# Patient Record
Sex: Female | Born: 1935 | Race: White | Hispanic: No | Smoking: Never smoker
Health system: Southern US, Community
[De-identification: ages and names within clinical notes are randomized; demographics above are authoritative.]

## PROBLEM LIST (undated history)

## (undated) DIAGNOSIS — I10 Essential (primary) hypertension: Secondary | ICD-10-CM

## (undated) DIAGNOSIS — K8020 Calculus of gallbladder without cholecystitis without obstruction: Secondary | ICD-10-CM

## (undated) DIAGNOSIS — M810 Age-related osteoporosis without current pathological fracture: Secondary | ICD-10-CM

## (undated) DIAGNOSIS — I4891 Unspecified atrial fibrillation: Secondary | ICD-10-CM

## (undated) DIAGNOSIS — N951 Menopausal and female climacteric states: Secondary | ICD-10-CM

## (undated) DIAGNOSIS — E8881 Metabolic syndrome: Secondary | ICD-10-CM

## (undated) DIAGNOSIS — M40209 Unspecified kyphosis, site unspecified: Secondary | ICD-10-CM

## (undated) DIAGNOSIS — E785 Hyperlipidemia, unspecified: Secondary | ICD-10-CM

## (undated) DIAGNOSIS — Z86010 Personal history of colonic polyps: Secondary | ICD-10-CM

## (undated) DIAGNOSIS — H269 Unspecified cataract: Secondary | ICD-10-CM

## (undated) DIAGNOSIS — I639 Cerebral infarction, unspecified: Secondary | ICD-10-CM

## (undated) DIAGNOSIS — Z860101 Personal history of adenomatous and serrated colon polyps: Secondary | ICD-10-CM

## (undated) DIAGNOSIS — S065X9A Traumatic subdural hemorrhage with loss of consciousness of unspecified duration, initial encounter: Secondary | ICD-10-CM

## (undated) DIAGNOSIS — S065XAA Traumatic subdural hemorrhage with loss of consciousness status unknown, initial encounter: Secondary | ICD-10-CM

## (undated) DIAGNOSIS — E669 Obesity, unspecified: Secondary | ICD-10-CM

## (undated) DIAGNOSIS — E559 Vitamin D deficiency, unspecified: Secondary | ICD-10-CM

## (undated) DIAGNOSIS — D369 Benign neoplasm, unspecified site: Secondary | ICD-10-CM

## (undated) HISTORY — DX: Menopausal and female climacteric states: N95.1

## (undated) HISTORY — DX: Traumatic subdural hemorrhage with loss of consciousness of unspecified duration, initial encounter: S06.5X9A

## (undated) HISTORY — DX: Benign neoplasm, unspecified site: D36.9

## (undated) HISTORY — DX: Age-related osteoporosis without current pathological fracture: M81.0

## (undated) HISTORY — DX: Metabolic syndrome: E88.810

## (undated) HISTORY — PX: BLADDER SUSPENSION: SHX72

## (undated) HISTORY — DX: Unspecified atrial fibrillation: I48.91

## (undated) HISTORY — DX: Personal history of adenomatous and serrated colon polyps: Z86.0101

## (undated) HISTORY — DX: Obesity, unspecified: E66.9

## (undated) HISTORY — DX: Personal history of colonic polyps: Z86.010

## (undated) HISTORY — DX: Unspecified kyphosis, site unspecified: M40.209

## (undated) HISTORY — DX: Hyperlipidemia, unspecified: E78.5

## (undated) HISTORY — DX: Calculus of gallbladder without cholecystitis without obstruction: K80.20

## (undated) HISTORY — DX: Vitamin D deficiency, unspecified: E55.9

## (undated) HISTORY — PX: COLONOSCOPY: SHX174

## (undated) HISTORY — PX: ABDOMINAL HYSTERECTOMY: SHX81

## (undated) HISTORY — PX: VAGINAL HYSTERECTOMY: SHX2639

## (undated) HISTORY — DX: Traumatic subdural hemorrhage with loss of consciousness status unknown, initial encounter: S06.5XAA

## (undated) HISTORY — DX: Unspecified cataract: H26.9

## (undated) HISTORY — DX: Metabolic syndrome: E88.81

## (undated) HISTORY — DX: Essential (primary) hypertension: I10

---

## 2000-05-28 ENCOUNTER — Encounter: Admission: RE | Admit: 2000-05-28 | Discharge: 2000-05-28 | Payer: Self-pay | Admitting: Obstetrics and Gynecology

## 2000-05-28 ENCOUNTER — Encounter: Payer: Self-pay | Admitting: Obstetrics and Gynecology

## 2001-05-26 ENCOUNTER — Other Ambulatory Visit: Admission: RE | Admit: 2001-05-26 | Discharge: 2001-05-26 | Payer: Self-pay | Admitting: Obstetrics and Gynecology

## 2003-03-02 ENCOUNTER — Inpatient Hospital Stay (HOSPITAL_COMMUNITY): Admission: EM | Admit: 2003-03-02 | Discharge: 2003-03-03 | Payer: Self-pay | Admitting: Emergency Medicine

## 2003-03-03 ENCOUNTER — Encounter (INDEPENDENT_AMBULATORY_CARE_PROVIDER_SITE_OTHER): Payer: Self-pay | Admitting: Specialist

## 2003-03-09 ENCOUNTER — Encounter: Admission: RE | Admit: 2003-03-09 | Discharge: 2003-03-09 | Payer: Self-pay | Admitting: Family Medicine

## 2005-06-15 ENCOUNTER — Ambulatory Visit (HOSPITAL_COMMUNITY): Admission: RE | Admit: 2005-06-15 | Discharge: 2005-06-15 | Payer: Self-pay | Admitting: Family Medicine

## 2006-03-26 ENCOUNTER — Other Ambulatory Visit: Admission: RE | Admit: 2006-03-26 | Discharge: 2006-03-26 | Payer: Self-pay | Admitting: Family Medicine

## 2006-12-03 LAB — HM COLONOSCOPY

## 2006-12-05 ENCOUNTER — Ambulatory Visit: Payer: Self-pay | Admitting: Internal Medicine

## 2006-12-19 ENCOUNTER — Ambulatory Visit: Payer: Self-pay | Admitting: Internal Medicine

## 2006-12-19 ENCOUNTER — Encounter (INDEPENDENT_AMBULATORY_CARE_PROVIDER_SITE_OTHER): Payer: Self-pay | Admitting: Specialist

## 2008-06-07 ENCOUNTER — Encounter: Admission: RE | Admit: 2008-06-07 | Discharge: 2008-06-07 | Payer: Self-pay | Admitting: Neurosurgery

## 2008-06-11 ENCOUNTER — Ambulatory Visit: Payer: Self-pay

## 2008-06-11 ENCOUNTER — Encounter: Payer: Self-pay | Admitting: Family Medicine

## 2008-06-18 ENCOUNTER — Ambulatory Visit: Payer: Self-pay | Admitting: Cardiology

## 2008-08-18 ENCOUNTER — Ambulatory Visit: Payer: Self-pay | Admitting: Cardiology

## 2008-09-22 ENCOUNTER — Encounter: Admission: RE | Admit: 2008-09-22 | Discharge: 2008-10-19 | Payer: Self-pay | Admitting: Specialist

## 2009-03-29 DIAGNOSIS — I10 Essential (primary) hypertension: Secondary | ICD-10-CM | POA: Insufficient documentation

## 2009-03-29 DIAGNOSIS — E785 Hyperlipidemia, unspecified: Secondary | ICD-10-CM

## 2009-03-30 ENCOUNTER — Ambulatory Visit: Payer: Self-pay | Admitting: Cardiology

## 2009-06-03 LAB — HM MAMMOGRAPHY

## 2010-06-15 LAB — HM PAP SMEAR

## 2010-06-30 ENCOUNTER — Encounter: Admission: RE | Admit: 2010-06-30 | Discharge: 2010-06-30 | Payer: Self-pay | Admitting: Family Medicine

## 2010-11-28 ENCOUNTER — Encounter: Payer: Self-pay | Admitting: Family Medicine

## 2010-11-28 DIAGNOSIS — Z860100 Personal history of colon polyps, unspecified: Secondary | ICD-10-CM | POA: Insufficient documentation

## 2010-11-28 DIAGNOSIS — I4891 Unspecified atrial fibrillation: Secondary | ICD-10-CM | POA: Insufficient documentation

## 2010-11-28 DIAGNOSIS — D369 Benign neoplasm, unspecified site: Secondary | ICD-10-CM

## 2010-11-28 DIAGNOSIS — E669 Obesity, unspecified: Secondary | ICD-10-CM

## 2010-11-28 DIAGNOSIS — K8020 Calculus of gallbladder without cholecystitis without obstruction: Secondary | ICD-10-CM | POA: Insufficient documentation

## 2010-11-28 DIAGNOSIS — M40209 Unspecified kyphosis, site unspecified: Secondary | ICD-10-CM | POA: Insufficient documentation

## 2010-11-28 DIAGNOSIS — N951 Menopausal and female climacteric states: Secondary | ICD-10-CM | POA: Insufficient documentation

## 2011-01-16 NOTE — Assessment & Plan Note (Signed)
[facility] HEALTHCARE                            CARDIOLOGY OFFICE NOTE   NAME:Kari [name], Kari [name]                       MRN:          [id number]  DATE:08/18/2008                            DOB:          [birth year]    PRIMARY:  Ernestina Penna, MD   REASON FOR PRESENTATION:  Evaluate the patient with atrial fibrillation.   HISTORY OF PRESENT ILLNESS:  The patient presents for followup of the  above.  Since I last saw her, she wore a Holter monitor, which  demonstrated AFib with a well-controlled rate.  She has otherwise done  quite well.  She is not noticing palpitations.  She has had no  presyncope or syncope.  She has had no chest discomfort, neck or arm  discomfort.  She has had no shortness of breath, PND, or orthopnea.  She  is tolerating the Coumadin.  She did slip on a step and broke a foot and  is on a walking cast.   PAST MEDICAL HISTORY:  Hyperlipidemia x5 years, hypertension x5 years,  hysterectomy.   ALLERGIES:  None.   MEDICATIONS:  1. Zetia 10 mg daily.  2. Pravastatin 80 mg daily.  3. Atenolol 50 mg daily.  4. Captopril 50 mg b.i.d.  5. Diovan HCT 320/25 daily.  6. Coumadin per [facility].   REVIEW OF SYSTEMS:  As stated in the HPI and otherwise negative for  other systems.   PHYSICAL EXAMINATION:  GENERAL:  The patient is in no distress.  VITAL SIGNS:  Blood pressure 144/97, heart rate 74 and irregular, weight  158 pounds, body mass index 20.  HEENT:  Eyes unremarkable, pupils equal, round, and reactive to light,  fundi not visualized, oral mucosa unremarkable.  NECK:  No jugular venous distention at 45 degrees, carotid upstroke  brisk and symmetric, no bruits, no thyromegaly.  LYMPHATICS:  No cervical, axillary, or inguinal adenopathy.  LUNGS:  Clear to auscultation bilaterally.  BACK:  No costovertebral angle tenderness.  CHEST:  Unremarkable.  HEART:  PMI not displaced or sustained, S1 and S2 within normal  limits,  no S3, no S4, no clicks, no rubs, no murmurs.  ABDOMEN:  Flat, positive bowel sounds normal in frequency and pitch, no  bruits, no rebound, no guarding, or midline pulsatile mass or  organomegaly.  SKIN:  No rashes, no nodules.  EXTREMITIES:  Pulse 2+, no edema.   ASSESSMENT AND PLAN:  1. Atrial fibrillation.  The patient was tolerating this rhythm with      rate control and anticoagulation.  At this point, no further      cardiovascular testing is suggested.  She will continue with the      medications as listed.  2. Hypertension.  Blood pressure is very slightly elevated today, but      typically is not.  This will be watched by Dr. Christell Constant.  3. Dyslipidemia per Dr. Christell Constant.  4. Followup.  I will see her back in 6 months and probably yearly      thereafter.     Rollene Rotunda, MD, [facility]  Electronically  Signed    JH/MedQ  DD: 08/18/2008  DT: 08/19/2008  Job #: [id number]   cc:   Ernestina Penna, M.D.

## 2011-01-16 NOTE — Assessment & Plan Note (Signed)
[facility] HEALTHCARE                            CARDIOLOGY OFFICE NOTE   NAME:[name], [name]                       MRN:          [id number]  DATE:03/30/2009                            DOB:          [birth year]    PRIMARY CARE PHYSICIAN:  Kari Penna, MD   REASON FOR PRESENTATION:  Evaluate the patient with atrial fibrillation.   HISTORY OF PRESENT ILLNESS:  The patient returns for followup of the  above.  She is 74.  Since I last saw her, she has done well.  She has  had no symptomatic palpitations.  She denies any presyncope or syncope.  She had had no chest pain or shortness of breath.  She is quite active  per her report.  She is tolerating Coumadin and having her followed at  [facility].   PAST MEDICAL HISTORY:  Persistent atrial fibrillation, hyperlipidemia x5  years, hypertension x5 years, and hysterectomy.   ALLERGIES:  None.   MEDICATIONS:  1. Zetia 10 mg daily.  2. Pravastatin 80 mg daily.  3. Atenolol 50 mg daily.  4. Captopril 50 mg b.i.d.  5. Diovan HCT 320/25 daily.  6. Coumadin.   REVIEW OF SYSTEMS:  As stated in HPI, otherwise negative for other  systems.   PHYSICAL EXAMINATION:  GENERAL:  The patient is in no distress.  VITAL SIGNS:  Blood pressure 138/80, heart rate 83 and irregular, weight  158 pounds, and body mass index 20.  NECK:  No jugular venous distention at 45 degrees; carotid upstroke  brisk and symmetric; no bruits, no thyromegaly.  LYMPHATICS:  No adenopathy.  LUNGS:  Clear to auscultation bilaterally.  HEART:  PMI not displaced or sustained; S1 and S2 within normal limits;  no S3; no murmurs, no clicks, no rubs.  ABDOMEN:  Flat; positive bowel sounds, normal in frequency and pitch; no  bruits, no rebound, no guarding; no midline pulsatile mass, no  organomegaly.  SKIN:  No rashes, no nodules.  EXTREMITIES:  2+ pulses, no edema.   EKG:  Atrial fibrillation, rate 83, axis within normal limits,  intervals  within normal limits, low voltage, no acute ST-T wave changes.   ASSESSMENT AND PLAN:  1. Atrial fibrillation.  The patient is having no symptoms related to      this.  She is tolerating rate control and anticoagulation as the      therapeutic strategy.  She will continue with this.  2. Hypertension.  Blood pressure is controlled.  She will continue the      meds as listed.  3. Followup.  I will see her back in about 18 months or sooner if      needed.     Rollene Rotunda, MD, [facility]  Electronically Signed    JH/MedQ  DD: 03/30/2009  DT: 03/31/2009  Job #: [id number]   cc:   Kari [name], M.D.

## 2011-01-16 NOTE — Assessment & Plan Note (Signed)
[facility] HEALTHCARE                            CARDIOLOGY OFFICE NOTE   NAME:Kari [name], Kari [name]                       MRN:          [id number]  DATE:06/18/2008                            DOB:          04/08/1934    PRIMARY CARE PHYSICIAN:  Ernestina Penna, MD   REASON FOR CONSULTATION:  Evaluate the patient with atrial fibrillation.   HISTORY OF PRESENT ILLNESS:  The patient is lovely 77 year old white  female without prior cardiac history.  She was noted on recent routine  physical to have atrial fibrillation.  She was not feeling this.  She  does not notice any palpitations.  She does not have any presyncope or  syncope.  She walks a mile everyday.  She has had no decrease her  exercise tolerance.  She has no shortness of breath.  She has no chest  discomfort, neck or arm discomfort.  She has no resting, shortness of  breath, has no PND or orthopnea.   The patient was noted to be in atrial fibrillation and was started on  Coumadin.  She did have an echocardiogram demonstrating no significant  valvular abnormalities with a well-preserved ejection fraction.   PAST MEDICAL HISTORY:  Hyperlipidemia x5 years, hypertension x5 years.   PAST SURGICAL HISTORY:  Hysterectomy.   ALLERGIES:  None.   MEDICATIONS:  1. Zetia 10 mg daily.  2. Pravastatin 80 mg daily.  3. Atenolol 50 mg daily.  4. Captopril 50 mg b.i.d.  5. Diovan HCT 320/25 daily.  6. Coumadin as directed.   SOCIAL HISTORY:  The patient he is retired.  She is married.  She is two  children.  She never smoked cigarettes or drink alcohol.   FAMILY HISTORY:  Noncontributory for early coronary artery disease,  though she did know her father's history.  Her mother died at 76.  She  thinks of old age.  She is not quite sure of her brothers or sisters  history so she thinks most of them died of natural causes.   REVIEW OF SYSTEMS:  As stated in the HPI and negative for all other  systems.   PHYSICAL  EXAMINATION:  GENERAL:  The patient is in no distress.  VITAL SIGNS:  Blood pressure 132/81, heart rate 77 and irregular, weight  162 pounds, body mass index 21.  HEENT:  Eyelids are unremarkable, pupils are equal, round and reactive  to light, fundi not visualized, oral mucosa remarkable.  NECK:  No jugular venous distention at 45 degrees, carotid upstroke  brisk and symmetric.  No bruits, no thyromegaly.  LYMPHATICS:  No cervical, axillary or inguinal adenopathy.  LUNGS:  Clear to auscultation bilaterally.  BACK:  No costovertebral angle tenderness.  CHEST:  Unremarkable.  HEART:  PMI not displaced or sustained, S1 and S2 within normal limits.  No S3, no clicks, no rubs, no murmurs.  ABDOMEN:  Mildly obese, positive bowel sounds normal in frequency and  pitch, no bruits, no rebound, no guarding, no midline pulsatile mass.  No hepatomegaly, no splenomegaly.  SKIN:  No rashes, no nodules.  EXTREMITIES:  2+  pulses throughout, no edema, no cyanosis, no clubbing.  NEURO:  Oriented to person, place and time, cranial nerves II-XII are  grossly intact, motor grossly intact throughout.   EKG done in Dr. Langston Masker' office, atrial fibrillation, rate 70s, axis  within normal limits, intervals within normal limits, nonspecific T-wave  changes with flattening diffusely.   ASSESSMENT AND PLAN:  1. Atrial fibrillation.  The patient has atrial fibrillation but is      not particularly symptomatic with this.  At this point I think, she      can be managed with rate control and anticoagulation.  She is      tolerating Coumadin and has no contraindications.  This will be      followed Dr. Christell Constant.  She is on atenolol for rate control.  I will      check a 24-hour Holter monitor to be applied at Dr. Kathi Der office      to see if she has reasonable rate control.  2. Dyslipidemia per Dr. Christell Constant.  She is on a statin.  A goal being LDL      less than 100, HDL greater than 50.  3. Hypertension.  Blood pressure  is well-controlled and she will      continue with medicines as listed.  4. Obesity.  The patient is slightly overweight.  We will continue to      discuss weight loss strategies.  5. Follow-up.  I will see the patient again in about 2 months in      [place] or sooner if needed.  Of note, she is up-to-date on her      blood work including thyroid studies which were recently done and      normal.  She does have a low vitamin D.     Rollene Rotunda, MD, [facility]  Electronically Signed    JH/MedQ  DD: 06/18/2008  DT: 06/19/2008  Job #: [id number]

## 2011-01-19 NOTE — Consult Note (Signed)
NAME:  Kari [name], HEICK NO.:  [id number]   MEDICAL RECORD NO.:  [id number]                   PATIENT TYPE:  INP   LOCATION:  2116                                 FACILITY:  [facility]   PHYSICIAN:  Althea Grimmer. Luther Parody, M.D.            DATE OF BIRTH:  [birth year]   DATE OF CONSULTATION:  DATE OF DISCHARGE:                                   CONSULTATION   HISTORY OF PRESENT ILLNESS:  [name] is a 77 year old female whom I am  asked to see for microcytic anemia and a history of guaiac positive stool.  She presented to the emergency room yesterday for admission feeling dizzy  with a hemoglobin of 7.9 and an MCV of 60.3.  She is unaware of any anemia,  however, reportedly in early 2002, her hemoglobin was 13.8 and in late 2002  in September, her hemoglobin was 9.3.  She has a history of recurrent  vertigo and nystagmus.  She denies any overt gastrointestinal bleeding.  She  has seen no melena or hematochezia.  She has no dysphagia, abdominal pain,  constipation or diarrhea or weight loss.  She does taken an adult aspirin  daily.  She has never had colonoscopic screening.  Fecal occult blood  testing, however, was reportedly negative in January.  In her primary  physician's office yesterday, the stool was reportedly grossly guaiac  positive but here in the hospital it has been guaiac negative.  Her son  reportedly has had a polyp removed from his colon.   PAST MEDICAL HISTORY:  1. Hypertension.  2. Obesity.  3. Hypercholesterolemia.  4. Cholelithiasis.  5. Kyphosis.  6. Vertigo.   PAST SURGICAL HISTORY:  Vaginal hysterectomy.   CURRENT MEDICATIONS:  1. Protonix 40 mg b.i.d. IV.  2. Capoten 50 mg t.i.d.  3. Tenormin 50 mg b.i.d.  4. Aspirin has been discontinued in hospital.  5. KCl 20 mEq daily.  6. Vitamin E 800 international units daily.  7. Pravachol 20 mg daily.   ALLERGIES:  No known drug allergies.   FAMILY HISTORY:  Son has had a polyp but  no other family history of ulcers,  inflammatory bowel disease or colorectal neoplasia.   SOCIAL HISTORY:  The patient is married.   HABITS:  She is a nonsmoker and does not drink.   REVIEW OF SYSTEMS:  GENERAL:  No weight loss or night sweats.  ENDOCRINE:  No known diabetes or thyroid problems.  SKIN:  No rash or pruritus. EYES:  No icterus or change in vision.  ENT:  No aphthous ulcers or chronic sore  throat.  RESPIRATORY:  Questionable minimal exertional dyspnea, no cough.  CARDIAC:  No chest pain, palpitations or history of valvular heart disease.  GI:  As above.  GU:  No dysuria or hematuria.  NEUROLOGIC:  Vertigo.   PHYSICAL EXAMINATION:  GENERAL APPEARANCE:  She is a well-developed,  overweight adult female  in no acute distress.  VITAL SIGNS:  She is afebrile with blood pressure 182/52, pulse 70 and  regular.  SKIN:  Normal.  HEENT:  Eyes are anicteric.  Conjunctivae questionably mildly pale.  Oropharynx unremarkable.  NECK:  Supple without thyromegaly.  LYMPHS:  There is no cervical or inguinal adenopathy.  CHEST:  Clear.  CARDIOVASCULAR:  Regular rate and rhythm.  ABDOMEN:  Mildly obese and soft without mass, tenderness, organomegaly.  There is no rebound or hernia.  Bowel sounds are normal.  RECTAL:  Examination not performed.  EXTREMITIES:  No clubbing, cyanosis, or edema.  No rash.   LABORATORY DATA:  Current hemoglobin 10.8, after transfusion from 7.9; MCV  60.3.  BUN 3.  Liver function tests normal.  In hospital fecal occult blood  test negative.   IMPRESSION:  A 77 year old female with reportedly guaiac positive stool and  a microcytic anemia.  This would imply chronic blood loss.  The most likely  etiologies are colonic neoplasia or nonspecific blood loss from chronic  aspirin use.  She also could have gastritis, erosions or ulcer disease  related to her aspirin.  It was my impression that colonoscopy is necessary,  particularly in light of her son's history of  polyps.  If colonoscopy is  negative, upper endoscopy is also definitely in order.   PLAN:  Colonoscopy is reviewed with the patient in terms of technique,  preparation and risks of complications including bleeding and perforation.  She agrees to proceed.  It will be scheduled for tomorrow morning.  Please  see the orders.  If colonoscopy is unrevealing, we will proceed immediately  with upper endoscopy at the same session.                                                Althea Grimmer. Luther Parody, M.D.    PJS/MEDQ  D:  03/02/2003  T:  03/02/2003  Job:  [id number]   cc:   Ernestina Penna, M.D.  [address] [place]  [place] [address]  Fax: [phone]

## 2011-01-19 NOTE — H&P (Signed)
NAME:  Kari [name], Kari [name] NO.:  [id number]   MEDICAL RECORD NO.:  [id number]                   PATIENT TYPE:  INP   LOCATION:  1825                                 FACILITY:  [facility]   PHYSICIAN:  Jonah Blue, M.D.                DATE OF BIRTH:  1934-07-31   DATE OF ADMISSION:  03/01/2003  DATE OF DISCHARGE:                                HISTORY & PHYSICAL   CHIEF COMPLAINT:  Swimmy headed times seven days.   HISTORY OF PRESENT ILLNESS:  This is a 77 year old white female with  hypertension and a history of three negative stool cards in January of this  year who was referred to the emergency department by her primary doctor for  evaluation of anemia and grossly positive Hemoccult in office today.  The  patient denies abdominal pain, nausea, vomiting, diarrhea, constipation,  bright red blood per rectum or melena.  She states that she awoke seven days  ago feeling swimmy headed, it is not positional and is not associated with  tinnitus or sinus problems.  She feels as though the room is spinning.  She  states that she had the same symptoms five years ago and at that time was  diagnosed with inner ear problems and was treated with Antivert; her  symptoms resolved at that time with Antivert.  She began taking Antivert  again last week when she began developing the same symptoms and the symptoms  resolved but broke through this morning despite having taken meclizine both  yesterday and today.  She denies any current symptoms.  She has been taking  normal p.o. with fluids but has had decreased solids.  She denies any  changes in bleeding or history of anemia.  She has been on aspirin for two  years.  She feels that her energy level has been great.  At the primary  physician's office today her hemoglobin was 7.9.   PAST MEDICAL HISTORY:  1. Hypertension with poor control, generally running 180's over 70's.  2. Hypercholesterolemia, last fasting lipid panel  showed an LDL of 86.  3. Obesity.  4. Cholelithiasis which has not been an active issue since 10 years ago.  5. Kyphosis.   PAST SURGICAL HISTORY:  Vaginal hysterectomy secondary to dysfunctional  uterine bleeding.   MEDICATIONS:  1. Capoten 50 mg b.i.d.  2. Tenormin 50 mg b.i.d.  3. Aspirin daily.  4. Kay Ciel 20 mEq daily.  5. Vitamin E 800 international units daily.  6. Pravachol 20 mg daily.  7. The patient had been on Premarin until approximately eight months ago.   ALLERGIES:  No known drug allergies.   FAMILY HISTORY:  Mother is deceased from a CVA and MI at 39-years of age.  Father is deceased from an MI at 99- years of age and he was an alcoholic.  Siblings have MI, CVA and breast cancer.  SOCIAL HISTORY:  The patient lives with her husband and is independent.  They were farmers.  She denies alcohol or tobacco.   REVIEW OF SYSTEMS:  NEUROLOGIC: Stable mood, great energy; walks one mild  daily or bikes four miles daily. GU: Denies vaginal bleeding.  Otherwise as  per history of present illness.   PHYSICAL EXAMINATION:  GENERAL: The patient is very pleasant, overweight,  and resting comfortably.  She is animated.  VITAL SIGNS: Temperature 97.6, pulse 70, blood pressure 199/92, repeat  156/72, saturating 97% on room air.  HEENT: Pupils are equal, round and reactive to light and accommodation.  Extraocular movements are intact.  Moist mucous membranes.  Tympanic  membranes clear bilaterally.  Oropharynx clear.  NECK: Supple, no lymphadenopathy, no thyromegaly.  CARDIOVASCULAR: Regular rate and rhythm without murmur.  No carotid bruits,  2+ radial and dorsalis pedis pulses.  LUNGS: Good air movements, mild diffuse crackles with inspiration in the  bibasilar areas.  ABDOMEN: Soft, nontender, obese, no hepatosplenomegaly, no mass.  Good bowel  sounds.  EXTREMITIES: No edema.  Normal range of motion.  GU: Normal female genitalia.  RECTAL: External hemorrhoids with a  small amount of brown stool.  NEURO: Cranial nerves grossly intact with good strength, tone and range of  motion.  Mental status intact.   LABORATORY DATA:  Sodium 142, potassium 3.9, chloride 106, CO2 29, BUN 5,  creatinine 0.6, glucose 114, calcium 9.1, bilirubin 0.6, alkaline  phosphatase 68, AST 18, ALT 12, total protein 6.7, albumin 3.7. A positive,  antibody negative.  WBC: 7500, hemoglobin 7.9, platelets 342,000, RBCs with  elliptocytes, MCV is decreased at 60.  Occult blood test is negative.   ASSESSMENT AND PLAN:  77 year old white female with significant anemia and  unremarkable history except for light headedness times one week with  reported grossly heme positive stools at PCP which is not validated by  testing in the emergency room but is validated by patient report.   1. Light headedness.  This is concerning for subacute blood loss, will     follow hemoglobins and check fecal Occult blood tests.  Transfuse two     units.  Potential causes include significant hemorrhoids as observed,     diverticula, gastrointestinal cancer, or a gastric source secondary to     daily aspirin.  The patient denies other nonsteroidals antiinflammatory     drug use.  She reports no previous history of known anemia.   1. Heme positive stools.  The patient is heme negative in the emergency     department but was grossly positive per check out report and patient     report while at [facility].  We will check fecal     Occult blood test x3.  This is most likely cause of anemia in this     patient and given her age, cancer is a tremendous concern.  There is no     known family history.   1. Anemia.  A patient without known history of anemia.  Will check iron     studies prior to transfusion and then transfuse two units packed red     blood cells.  Follow hemoglobin and hematocrit q.4h over night.  1. Hypertension.  History of poor control.  Will follow on ACE inhibitor  and     beta blocker and titrate accordingly.   1. Hypercholesterolemia.  Good control at last check on Pravachol.   1.     Code status.  The patient initially desired DNR, DNI but after discussion     with husband she has reconsidered, she will be a full code at this time.                                               Jonah Blue, M.D.    Milas Gain  D:  03/02/2003  T:  03/02/2003  Job:  [id number]   cc:   Gaynelle Cage, MD  [phone] [address]  [place]  [place] [address]  Fax: [phone]    cc:   Gaynelle Cage, MD  [phone] [address]  [place]  [place] [address]  Fax: [phone]

## 2011-01-19 NOTE — Discharge Summary (Signed)
NAME:  Kari [name], Kari [name] NO.:  [id number]   MEDICAL RECORD NO.:  [id number]                   PATIENT TYPE:  INP   LOCATION:  5740                                 FACILITY:  [facility]   PHYSICIAN:  Leighton Roach McDiarmid, M.D.             DATE OF BIRTH:  Apr 23, 1934   DATE OF ADMISSION:  03/01/2003  DATE OF DISCHARGE:  03/03/2003                                 DISCHARGE SUMMARY   CONSULTATIONS:  Althea Grimmer. Luther Parody, M.D., at [facility]   DISCHARGE DIAGNOSES:  1. Colonic polyp.  2. Iron-deficiency anemia.  3. Benign paroxysmal positional vertigo.  4. Hypertension.  5. Hypercholesterolemia.   DISCHARGE MEDICATIONS:  1. Iron 325 mg t.i.d. on an empty stomach.  2. Antevert 12.5 mg p.o. b.i.d. p.r.n.  3. Capoten 50 mg p.o. t.i.d.  4. Tenormin 50 mg p.o. b.i.d.  5. Pravachol 20 mg p.o. daily.  6. Potassium chloride 20 mEq p.o. daily.  7. Vitamin E 800 IU p.o. daily.   SPECIAL INSTRUCTIONS:  The patient is to continue green leafy vegetables and  occasional red meat.  May use fiber supplements and increasing water if iron  makes her constipated.  She is to avoid aspirin x10 days.   DISPOSITION:  The patient is discharged home in stable condition.   FOLLOWUP:  March 10, 2003, at 11:30 with Dr. Gaynelle Cage, at [facility].   HISTORY:  The patient is a 77 year old female with known hypertension and  history of negative stool cards in January 2004, who was referred to the  emergency department by her primary care physician for evaluation of anemia  and grossly positive Hemoccult in the office.  The patient denies  abdominal pain, nausea, vomiting, diarrhea, or constipation.  She did not  note any bright red blood per rectum, no melena.  She had awoken several  days feeling swimmy headed.  It was not believed to be positional or  tinnitus, no sinus symptoms, stated the room was turning.  She had the same  symptoms five years ago  when she was diagnosed with inner ear problems.  These symptoms were controlled with Antevert prior to the day of admission.  The patient does not know of any bruising or history of anemia.  She has  been on aspirin for the past two years.   PHYSICAL EXAMINATION:  VITAL SIGNS:  Temperature 97.6, heart rate 70, blood  pressure 199/92.  GENERAL:  The patient was very pleasant and overweight, resting comfortably.  HEENT:  Oropharynx was clear.  Tympanic membranes were clear bilaterally.  Mucosal membranes were pink and moist.  CARDIOVASCULAR:  Cardiovascular showed a normal precordium and S1, S2, and  regular rate and rhythm.  No ectopy was appreciated.  LUNGS:  Clear.  ABDOMEN:  Good bowel sounds and was soft.  No ecchymosis was noted.  GENITALIA:  The patient had normal female genitalia.  RECTAL:  Noted external hemorrhoids, a small amount of stool in the rectal  vault, and negative guaiac.   LABORATORY DATA:  Hemoglobin 7.9, hematocrit 26.9, white blood cells on  smear showed elliptocytes, MCV 60.3.  As mentioned above, fecal occult blood  was negative.  BUN 5, creatinine 0.6.  Total bilirubin 0.6.   HOSPITAL COURSE:  Problem #1.  Anemia:  With reports of positive guaiac and  bright red blood per rectum at the patient's primary care physician,  concerned for GI bleed.  The patient was admitted to stepdown, given IV  Protonix b.i.d. and aggressive fluid rehydration.  Transfused the patient  two units of packed red blood cells.  Hemoglobin responded nicely from 7.9  to 10.7 on the day of discharge.  Past medical record review showed a  hemoglobin in September 2002, to be 9.3.  Dr. Roosvelt Harps from [facility]  was consulted for endoscopy.  A colonoscopy was performed on June 23,  revealing a 40 mm colonic polyp at the sigmoid colon approximately 28 cm  from the anus.  Polypectomy was performed and polyp sent to pathology.  The  patient is to have followup with Dr. Luther Parody pending  pathology report.  A  ferritin was 3, iron 12, TIBC 419, percent saturation 3, reticulocyte count  1.2.  These findings are consistent with severe iron-deficiency anemia  likely secondary to slow chronic bleed from polyp plus aspirin use.  Aspirin  to be held until re-evaluation by the patient's primary care physician or at  least x10 days.  Iron supplementation was provided.  Recommend rechecking  reticulocyte count and hemoglobin at followup visit in one week.   Problem #2.  Benign paroxysmal positional vertigo:  The patient had positive  history diagnosed five years ago that had responded well to Antivert.  Hallpike maneuver in-house was positive.  Described Apley maneuver to the  patient as well as chronic stimulation to decrease her symptoms of vertigo.  The patient may continue to use Antivert on an as needed basis.   Problem #3.  Hypertension:  Inadequately controlled.  Increase captopril to  50 mg t.i.d.  Continue Tenormin at 50 mg daily.  Will need further  outpatient adjustment.  Recommended the patient to continue her diet and  exercise.   Problem #4.  Hypercholesterolemia:  Continue Pravachol 20 mg daily.  Per  clinic chart review, the patient's last fasting lipid profile was in  December 2003, which showed triglycerides 79 and LDL 86, excellent control.     Lesly Rubenstein, M.D.                   Etta Grandchild, M.D.    SKS/MEDQ  D:  03/03/2003  T:  03/03/2003  Job:  [id number]   cc:   Gaynelle Cage, MD  [phone] [address]  [place]  [place] [address]  Fax: [phone]   Althea Grimmer. Luther Parody, M.D.  [address], [address]  [place]  [place] [address]  Fax: [phone]    cc:   Gaynelle Cage, MD  [phone] [address]  [place]  [place] [address]  Fax: [phone]   Althea Grimmer. Luther Parody, M.D.  [address]  [place]  [place] [address]  Fax: [phone]

## 2011-04-12 ENCOUNTER — Encounter: Payer: Self-pay | Admitting: Cardiology

## 2011-08-08 ENCOUNTER — Encounter: Payer: Self-pay | Admitting: Cardiology

## 2011-08-15 ENCOUNTER — Ambulatory Visit (INDEPENDENT_AMBULATORY_CARE_PROVIDER_SITE_OTHER): Payer: Medicare Other | Admitting: Cardiology

## 2011-08-15 ENCOUNTER — Encounter: Payer: Self-pay | Admitting: Cardiology

## 2011-08-15 VITALS — BP 138/72 | HR 74 | Resp 18 | Ht 61.0 in | Wt 154.0 lb

## 2011-08-15 DIAGNOSIS — I4891 Unspecified atrial fibrillation: Secondary | ICD-10-CM

## 2011-08-15 DIAGNOSIS — E669 Obesity, unspecified: Secondary | ICD-10-CM

## 2011-08-15 DIAGNOSIS — I10 Essential (primary) hypertension: Secondary | ICD-10-CM

## 2011-08-15 NOTE — Assessment & Plan Note (Signed)
The patient  tolerates this rhythm and rate control and anticoagulation. We will continue with the meds as listed. We discussed the new alternative to warfarin but she is not interested in changing.  She will continue with current therapy.

## 2011-08-15 NOTE — Assessment & Plan Note (Signed)
The patient understands the need to lose weight with diet and exercise. We have discussed specific strategies for this.  

## 2011-08-15 NOTE — Assessment & Plan Note (Signed)
The blood pressure is at target. No change in medications is indicated. We will continue with therapeutic lifestyle changes (TLC).  

## 2011-08-15 NOTE — Patient Instructions (Signed)
The current medical regimen is effective;  continue present plan and medications.  Follow up in 1 and 1/2  years with Dr Antoine Poche.  You will receive a letter in the mail 2 months before you are due.  Please call us when you receive this letter to schedule your follow up appointment.

## 2011-08-15 NOTE — Progress Notes (Signed)
   HPI The patient presents for followup of atrial fibrillation. It has been 18 months since I last saw her.  She continues to do well. She denies any chest pressure, neck or arm discomfort. She does not notice any dysrhythmias and has no presyncope or syncope. She denies any chest pressure, neck or arm discomfort. She denies any shortness of breath, PND or orthopnea. She's had no weight gain or edema. She tolerates her warfarin.  Allergies  Allergen Reactions  . Erythromycin (Ery)   . Penicillins   . Vytorin     Current Outpatient Prescriptions  Medication Sig Dispense Refill  . amLODipine-valsartan (EXFORGE) 5-320 MG per tablet Take 1 tablet by mouth daily.        Marland Kitchen atenolol (TENORMIN) 50 MG tablet Take 50 mg by mouth. 2 TABS QD       . captopril (CAPOTEN) 50 MG tablet Take 50 mg by mouth 2 (two) times daily.        . Cholecalciferol (VITAMIN D3) 1000 UNITS CAPS Take 1 capsule by mouth daily.        Marland Kitchen ezetimibe (ZETIA) 10 MG tablet Take 10 mg by mouth daily.        . hydrochlorothiazide (,MICROZIDE/HYDRODIURIL,) 12.5 MG capsule Take 12.5 mg by mouth daily.        . pravastatin (PRAVACHOL) 40 MG tablet Take 40 mg by mouth. 2 TABS       . warfarin (COUMADIN) 2.5 MG tablet Take 2.5 mg by mouth. AS DIRECTED         Past Medical History  Diagnosis Date  . Obesity   . Symptomatic menopausal or female climacteric states   . Kyphosis   . Gallstones   . Adenomatous polyps   . AF (atrial fibrillation)   . Hyperlipidemia     x5 years  . Hypertension     x5 years    Past Surgical History  Procedure Date  . Vaginal hysterectomy     ROS:  As stated in the HPI and negative for all other systems.  PHYSICAL EXAM BP 138/72  Pulse 74  Resp 18  Ht 5\' 1"  (1.549 m)  Wt 154 lb (69.854 kg)  BMI 29.10 kg/m2 GENERAL:  Well appearing HEENT:  Pupils equal round and reactive, fundi not visualized, oral mucosa unremarkable NECK:  No jugular venous distention, waveform within normal limits,  carotid upstroke brisk and symmetric, no bruits, no thyromegaly LYMPHATICS:  No cervical, inguinal adenopathy LUNGS:  Clear to auscultation bilaterally BACK:  No CVA tenderness CHEST:  Unremarkable HEART:  PMI not displaced or sustained,S1 and S2 within normal limits, no S3 no clicks, no rubs, no murmurs, irregular ABD:  Flat, positive bowel sounds normal in frequency in pitch, no bruits, no rebound, no guarding, no midline pulsatile mass, no hepatomegaly, no splenomegaly EXT:  2 plus pulses throughout, no edema, no cyanosis no clubbing SKIN:  No rashes no nodules NEURO:  Cranial nerves II through XII grossly intact, motor grossly intact throughout PSYCH:  Cognitively intact, oriented to person place and time  EKG:  Atrial fibrillation, rate 85, axis within normal limits, QTC slightly prolonged, nonspecific diffuse ST-T wave changes.  08/15/2011  ASSESSMENT AND PLAN

## 2011-09-10 ENCOUNTER — Encounter: Payer: Self-pay | Admitting: Cardiology

## 2011-09-10 DIAGNOSIS — I10 Essential (primary) hypertension: Secondary | ICD-10-CM | POA: Diagnosis not present

## 2011-09-10 DIAGNOSIS — E785 Hyperlipidemia, unspecified: Secondary | ICD-10-CM | POA: Diagnosis not present

## 2011-09-10 DIAGNOSIS — I259 Chronic ischemic heart disease, unspecified: Secondary | ICD-10-CM | POA: Diagnosis not present

## 2011-09-10 DIAGNOSIS — R0602 Shortness of breath: Secondary | ICD-10-CM | POA: Diagnosis not present

## 2011-09-10 DIAGNOSIS — I4891 Unspecified atrial fibrillation: Secondary | ICD-10-CM | POA: Diagnosis not present

## 2011-10-15 DIAGNOSIS — I4891 Unspecified atrial fibrillation: Secondary | ICD-10-CM | POA: Diagnosis not present

## 2011-10-29 DIAGNOSIS — I4891 Unspecified atrial fibrillation: Secondary | ICD-10-CM | POA: Diagnosis not present

## 2011-11-19 ENCOUNTER — Encounter: Payer: Self-pay | Admitting: Internal Medicine

## 2011-12-13 ENCOUNTER — Encounter: Payer: Self-pay | Admitting: Internal Medicine

## 2011-12-13 DIAGNOSIS — I4891 Unspecified atrial fibrillation: Secondary | ICD-10-CM | POA: Diagnosis not present

## 2011-12-13 DIAGNOSIS — E559 Vitamin D deficiency, unspecified: Secondary | ICD-10-CM | POA: Diagnosis not present

## 2011-12-13 DIAGNOSIS — I10 Essential (primary) hypertension: Secondary | ICD-10-CM | POA: Diagnosis not present

## 2011-12-13 DIAGNOSIS — E785 Hyperlipidemia, unspecified: Secondary | ICD-10-CM | POA: Diagnosis not present

## 2012-01-02 DIAGNOSIS — Z7901 Long term (current) use of anticoagulants: Secondary | ICD-10-CM | POA: Diagnosis not present

## 2012-01-02 DIAGNOSIS — I4891 Unspecified atrial fibrillation: Secondary | ICD-10-CM | POA: Diagnosis not present

## 2012-01-02 DIAGNOSIS — M48061 Spinal stenosis, lumbar region without neurogenic claudication: Secondary | ICD-10-CM | POA: Diagnosis not present

## 2012-01-04 DIAGNOSIS — Z1212 Encounter for screening for malignant neoplasm of rectum: Secondary | ICD-10-CM | POA: Diagnosis not present

## 2012-01-04 DIAGNOSIS — R7989 Other specified abnormal findings of blood chemistry: Secondary | ICD-10-CM | POA: Diagnosis not present

## 2012-01-08 ENCOUNTER — Ambulatory Visit: Payer: Medicare Other | Admitting: Internal Medicine

## 2012-01-22 ENCOUNTER — Encounter: Payer: Self-pay | Admitting: *Deleted

## 2012-01-23 ENCOUNTER — Encounter: Payer: Self-pay | Admitting: Internal Medicine

## 2012-01-23 ENCOUNTER — Telehealth: Payer: Self-pay

## 2012-01-23 ENCOUNTER — Ambulatory Visit (INDEPENDENT_AMBULATORY_CARE_PROVIDER_SITE_OTHER): Payer: Medicare Other | Admitting: Internal Medicine

## 2012-01-23 VITALS — BP 110/74 | HR 90 | Ht 61.5 in | Wt 152.8 lb

## 2012-01-23 DIAGNOSIS — Z860100 Personal history of colon polyps, unspecified: Secondary | ICD-10-CM

## 2012-01-23 DIAGNOSIS — Z01818 Encounter for other preprocedural examination: Secondary | ICD-10-CM | POA: Diagnosis not present

## 2012-01-23 DIAGNOSIS — Z7901 Long term (current) use of anticoagulants: Secondary | ICD-10-CM | POA: Diagnosis not present

## 2012-01-23 DIAGNOSIS — Z1211 Encounter for screening for malignant neoplasm of colon: Secondary | ICD-10-CM

## 2012-01-23 DIAGNOSIS — I4891 Unspecified atrial fibrillation: Secondary | ICD-10-CM | POA: Diagnosis not present

## 2012-01-23 MED ORDER — PEG-KCL-NACL-NASULF-NA ASC-C 100 G PO SOLR: 1.0000 | Freq: Once | ORAL | Status: DC

## 2012-01-23 NOTE — Patient Instructions (Signed)
You have been scheduled for a colonoscopy with propofol. Please follow written instructions given to you at your visit today.  Please pick up your prep kit at the pharmacy within the next 1-3 days.  You will be contaced by our office prior to your procedure for directions on holding your Coumadin/Warfarin.  If you do not hear from our office 1 week prior to your scheduled procedure, please call [phone] to discuss.  If your lesion on your chest does not resolve go and see Dr. Christell Constant.

## 2012-01-23 NOTE — Telephone Encounter (Signed)
[facility] [address]  [place] [address]  01/23/2012    RE: EVALISE ABRUZZESE DOB: April 11, 1934 MRN: [id number]   Dear Dr. Antoine Poche,    We have scheduled the above patient for an endoscopic procedure. Our records show that she is on anticoagulation therapy.   Please advise as to how long the patient may come off her therapy of coumadin prior to the procedure, which is scheduled for 02/27/12.  Please fax back/ or route the completed form to Tannah Dreyfuss [place] at [phone].   Sincerely,  [place], Clayborne Dana

## 2012-01-23 NOTE — Progress Notes (Signed)
  Subjective:    Patient ID: Kari [name], female    DOB: [birth year], 78 y.o.   MRN: [id number]  HPI This is a pleasant 78 year old white woman with a history of adenomatous colon polyps. She had an advanced adenoma removed in 2004, a 4 mm polyp removed but not recovered in 2008. She is not having any GI problems at this time. She takes warfarin for chronic atrial fibrillation, she has not had complications like a stroke. Recently had an immune based fecal occult blood test which was negative.  Medications, allergies, past medical history, past surgical history, family history and social history are reviewed and updated in the EMR.  Review of Systems As per history of present illness. She does have a lesion on her chest wall, a church member questioned whether or not it's a tick 3 days ago. It is asymptomatic.    Objective:   Physical Exam General:  NAD Eyes:   anicteric Lungs:  clear Heart:  S1S2 no rubs, murmurs or gallops Abdomen:  soft and nontender, BS+ Ext:   no edema  ? Tick in mid-chest - erythema and hard brown center - unroofed - not a tick - ? Shiny eschar   Data Reviewed:  Basic metabolic panel 5/3 notable for potassium 3.4 glucose 105. Otherwise normal. Her INR was 2.3 on May 22.      Assessment & Plan:   1. Personal history of adenomatous colonic polyps   2. Special screening for malignant neoplasms, colon   3. Preoperative examination, unspecified   4. Warfarin anticoagulation for atrial fibrillation    It is reasonable to pursue a screening and surveillance colonoscopy at this point.The risks and benefits as well as alternatives of endoscopic procedure(s) have been discussed and reviewed. All questions answered. The patient agrees to proceed. I have explained the need to hold warfarin if acceptable. There is a very small risk of stroke off warfarin in the setting of major fibrillation. We will notify and request Dr. Antoine Poche 2 approve or recommend Lovenox window if  needed I think it will not be.  As above, I thought to that she might have a tick embedded in her anterior chest wall and tried unroofed at and remove it with 3 views, it looks like it was probably some sort of hard scab. She did not bleed. I've advised to follow with primary care and this lesion does not resolve in a few days.  I appreciate the opportunity to care for this patient.   CC: Rudi Heap, MD, MD

## 2012-01-29 NOTE — Telephone Encounter (Signed)
OK to hold warfarin as needed for the procedure.  Please restart when she is able post procedure.

## 2012-01-30 NOTE — Telephone Encounter (Signed)
LM for pt to call me back to discuss what to do regarding her coumadin prior to her colonoscopy 02/27/12.

## 2012-01-31 NOTE — Telephone Encounter (Signed)
Pt phoned back and I informed her that Dr. Antoine Poche cleared her to be off her coumadin for her colonoscopy 02/27/12. She will hold it starting 02/22/12, which is 5 days prior to her colon.  Pt verbalized understanding.

## 2012-02-27 ENCOUNTER — Ambulatory Visit (AMBULATORY_SURGERY_CENTER): Payer: Medicare Other | Admitting: Internal Medicine

## 2012-02-27 ENCOUNTER — Encounter: Payer: Self-pay | Admitting: Internal Medicine

## 2012-02-27 VITALS — BP 105/70 | HR 98 | Temp 98.7°F | Resp 18 | Ht 61.0 in | Wt 152.0 lb

## 2012-02-27 DIAGNOSIS — Z86010 Personal history of colonic polyps: Secondary | ICD-10-CM

## 2012-02-27 DIAGNOSIS — Z1211 Encounter for screening for malignant neoplasm of colon: Secondary | ICD-10-CM

## 2012-02-27 DIAGNOSIS — K5730 Diverticulosis of large intestine without perforation or abscess without bleeding: Secondary | ICD-10-CM

## 2012-02-27 MED ORDER — SODIUM CHLORIDE 0.9 % IV SOLN: 500.0000 mL | INTRAVENOUS | Status: DC

## 2012-02-27 NOTE — Op Note (Signed)
[facility] [address] [place], [place]  [address]  COLONOSCOPY PROCEDURE REPORT  PATIENT:  Kari [name], Kari [name]  MR#:  [id number] BIRTHDATE:  1934-02-08, 77 yrs. old  GENDER:  female ENDOSCOPIST:  Iva Boop, MD, [facility]  PROCEDURE DATE:  02/27/2012 PROCEDURE:  Higher-risk screening colonoscopy G0105  ASA CLASS:  Class III INDICATIONS:  surveillance and high-risk screening, history of pre-cancerous (adenomatous) colon polyps advanced adenoma 2004, diminutive polyp 2008 MEDICATIONS:   MAC sedation, administered by CRNA, propofol (Diprivan) 80 mg IV  DESCRIPTION OF PROCEDURE:   After the risks benefits and alternatives of the procedure were thoroughly explained, informed consent was obtained.  Digital rectal exam was performed and revealed no abnormalities.   The LB [id number]-[id number] endoscope was introduced through the anus and advanced to the cecum, which was identified by both the appendix and ileocecal valve, without limitations.  The quality of the prep was excellent, using MoviPrep.  The instrument was then slowly withdrawn as the colon was fully examined. <<PROCEDUREIMAGES>>  FINDINGS:  Mild diverticulosis was found in the sigmoid colon. This was otherwise a normal examination of the colon. Includes right colon retroflexion.   Retroflexed views in the rectum revealed no abnormalities.    The time to cecum = 2:22 minutes. The scope was then withdrawn in 6:11 minutes from the cecum and the procedure completed. COMPLICATIONS:  None ENDOSCOPIC IMPRESSION: 1) Mild diverticulosis in the sigmoid colon 2) Otherwise normal examination 3) Personal hx adenoma 2004 RECOMMENDATIONS: 1) No recall - discuss repeat colonoscopy in 5 years with PCP 2) Resume warfarin  Iva Boop, MD, [name]  CC:  Rudi Heap, MDThe Patient  n. Rosalie Doctor:   Iva Boop at 02/27/2012 12:20 PM  Moody Bruins, [phone]

## 2012-02-27 NOTE — Patient Instructions (Addendum)
The colon had some diverticulosis but no polyps! I do not recommend anymore routine repeat colonoscopy tests. Please restart the warfarin and have the INR checked at your anticoagulation clinic within 1 week. Iva Boop, MD, FACG  YOU HAD AN ENDOSCOPIC PROCEDURE TODAY AT THE [facility]: Refer to the procedure report that was given to you for any specific questions about what was found during the examination.  If the procedure report does not answer your questions, please call your gastroenterologist to clarify.  If you requested that your care partner not be given the details of your procedure findings, then the procedure report has been included in a sealed envelope for you to review at your convenience later.  YOU SHOULD EXPECT: Some feelings of bloating in the abdomen. Passage of more gas than usual.  Walking can help get rid of the air that was put into your GI tract during the procedure and reduce the bloating. If you had a lower endoscopy (such as a colonoscopy or flexible sigmoidoscopy) you may notice spotting of blood in your stool or on the toilet paper. If you underwent a bowel prep for your procedure, then you may not have a normal bowel movement for a few days.  DIET: Your first meal following the procedure should be a light meal and then it is ok to progress to your normal diet.  A half-sandwich or bowl of soup is an example of a good first meal.  Heavy or fried foods are harder to digest and may make you feel nauseous or bloated.  Likewise meals heavy in dairy and vegetables can cause extra gas to form and this can also increase the bloating.  Drink plenty of fluids but you should avoid alcoholic beverages for 24 hours.  ACTIVITY: Your care partner should take you home directly after the procedure.  You should plan to take it easy, moving slowly for the rest of the day.  You can resume normal activity the day after the procedure however you should NOT DRIVE or use heavy  machinery for 24 hours (because of the sedation medicines used during the test).    SYMPTOMS TO REPORT IMMEDIATELY: A gastroenterologist can be reached at any hour.  During normal business hours, 8:30 AM to 5:00 PM Monday through Friday, call [phone].  After hours and on weekends, please call the GI answering service at [phone] who will take a message and have the physician on call contact you.   Following lower endoscopy (colonoscopy or flexible sigmoidoscopy):  Excessive amounts of blood in the stool  Significant tenderness or worsening of abdominal pains  Swelling of the abdomen that is new, acute  Fever of 100F or higher  Following upper endoscopy (EGD)  Vomiting of blood or coffee ground material  New chest pain or pain under the shoulder blades  Painful or persistently difficult swallowing  New shortness of breath  Fever of 100F or higher  Black, tarry-looking stools  FOLLOW UP: If any biopsies were taken you will be contacted by phone or by letter within the next 1-3 weeks.  Call your gastroenterologist if you have not heard about the biopsies in 3 weeks.  Our staff will call the home number listed on your records the next business day following your procedure to check on you and address any questions or concerns that you may have at that time regarding the information given to you following your procedure. This is a courtesy call and so if there is  no answer at the home number and we have not heard from you through the emergency physician on call, we will assume that you have returned to your regular daily activities without incident.  SIGNATURES/CONFIDENTIALITY: You and/or your care partner have signed paperwork which will be entered into your electronic medical record.  These signatures attest to the fact that that the information above on your After Visit Summary has been reviewed and is understood.  Full responsibility of the confidentiality of this discharge  information lies with you and/or your care-partner.   Please follow all discharge instructions given to you by the recovery room nurse. If you have any questions or problems after discharge please call one of the numbers listed above. You will receive a phone call in the am to see how you are doing and answer any questions you may have. Thank you for choosing [facility] for your health care needs.

## 2012-02-27 NOTE — Progress Notes (Signed)
Patient did not experience any of the following events: a burn prior to discharge; a fall within the facility; wrong site/side/patient/procedure/implant event; or a hospital transfer or hospital admission upon discharge from the facility. (G8907) Patient did not have preoperative order for IV antibiotic SSI prophylaxis. (G8918)  

## 2012-02-28 ENCOUNTER — Telehealth: Payer: Self-pay | Admitting: *Deleted

## 2012-02-28 NOTE — Telephone Encounter (Signed)
  Follow up Call-  Call back number 02/27/2012  Post procedure Call Back phone  # [phone]  Permission to leave phone message Yes     Patient questions:  Do you have a fever, pain , or abdominal swelling? no Pain Score  0 *  Have you tolerated food without any problems? yes  Have you been able to return to your normal activities? yes  Do you have any questions about your discharge instructions: Diet   no Medications  no Follow up visit  no  Do you have questions or concerns about your Care? no  Actions: * If pain score is 4 or above: No action needed, pain <4.

## 2012-03-10 DIAGNOSIS — I4891 Unspecified atrial fibrillation: Secondary | ICD-10-CM | POA: Diagnosis not present

## 2012-04-15 DIAGNOSIS — I10 Essential (primary) hypertension: Secondary | ICD-10-CM | POA: Diagnosis not present

## 2012-04-15 DIAGNOSIS — I4891 Unspecified atrial fibrillation: Secondary | ICD-10-CM | POA: Diagnosis not present

## 2012-04-21 DIAGNOSIS — H2510 Age-related nuclear cataract, unspecified eye: Secondary | ICD-10-CM | POA: Diagnosis not present

## 2012-04-23 DIAGNOSIS — R5381 Other malaise: Secondary | ICD-10-CM | POA: Diagnosis not present

## 2012-04-23 DIAGNOSIS — E559 Vitamin D deficiency, unspecified: Secondary | ICD-10-CM | POA: Diagnosis not present

## 2012-04-23 DIAGNOSIS — I10 Essential (primary) hypertension: Secondary | ICD-10-CM | POA: Diagnosis not present

## 2012-04-23 DIAGNOSIS — E785 Hyperlipidemia, unspecified: Secondary | ICD-10-CM | POA: Diagnosis not present

## 2012-04-23 DIAGNOSIS — R5383 Other fatigue: Secondary | ICD-10-CM | POA: Diagnosis not present

## 2012-04-30 ENCOUNTER — Encounter: Payer: Self-pay | Admitting: Cardiology

## 2012-05-03 ENCOUNTER — Inpatient Hospital Stay (HOSPITAL_COMMUNITY)
Admission: EM | Admit: 2012-05-03 | Discharge: 2012-05-04 | DRG: 641 | Disposition: A | Discharge status: Home / Self Care | Payer: Medicare Other | Attending: Internal Medicine | Admitting: Internal Medicine

## 2012-05-03 ENCOUNTER — Encounter (HOSPITAL_COMMUNITY): Payer: Self-pay | Admitting: *Deleted

## 2012-05-03 DIAGNOSIS — E876 Hypokalemia: Secondary | ICD-10-CM | POA: Diagnosis not present

## 2012-05-03 DIAGNOSIS — E785 Hyperlipidemia, unspecified: Secondary | ICD-10-CM | POA: Diagnosis present

## 2012-05-03 DIAGNOSIS — M40209 Unspecified kyphosis, site unspecified: Secondary | ICD-10-CM

## 2012-05-03 DIAGNOSIS — Z79899 Other long term (current) drug therapy: Secondary | ICD-10-CM

## 2012-05-03 DIAGNOSIS — R0789 Other chest pain: Secondary | ICD-10-CM | POA: Diagnosis not present

## 2012-05-03 DIAGNOSIS — I10 Essential (primary) hypertension: Secondary | ICD-10-CM | POA: Diagnosis present

## 2012-05-03 DIAGNOSIS — E669 Obesity, unspecified: Secondary | ICD-10-CM

## 2012-05-03 DIAGNOSIS — I4891 Unspecified atrial fibrillation: Secondary | ICD-10-CM | POA: Diagnosis not present

## 2012-05-03 DIAGNOSIS — Z7901 Long term (current) use of anticoagulants: Secondary | ICD-10-CM

## 2012-05-03 DIAGNOSIS — Z860100 Personal history of colon polyps, unspecified: Secondary | ICD-10-CM

## 2012-05-03 DIAGNOSIS — R918 Other nonspecific abnormal finding of lung field: Secondary | ICD-10-CM | POA: Diagnosis not present

## 2012-05-03 DIAGNOSIS — I517 Cardiomegaly: Secondary | ICD-10-CM | POA: Diagnosis not present

## 2012-05-03 DIAGNOSIS — N951 Menopausal and female climacteric states: Secondary | ICD-10-CM

## 2012-05-03 DIAGNOSIS — K8020 Calculus of gallbladder without cholecystitis without obstruction: Secondary | ICD-10-CM

## 2012-05-03 NOTE — ED Notes (Signed)
PT reports increased pain to back and shoulder on left side. Pt has a HX of lt leg pain.

## 2012-05-03 NOTE — ED Notes (Signed)
Pt reports bilateral leg pain x4 years, pt reports increase pain to the (L) leg, pt also reports (L) leg pain is radiating up her leg into her waist and radiating into her (L) shoulder x1 day. Pt reports receiving injection for her pain but can not remember the name of the injection, pcp dc'd her coumadin Wednesday august 28th d/t pt is to stop her coumadin 5 days prior to receiving the injection. Pt denies chest/back/abd pain, sob, N/V/D, or dizziness.

## 2012-05-03 NOTE — ED Notes (Signed)
Family at bedside. 

## 2012-05-04 ENCOUNTER — Encounter (HOSPITAL_COMMUNITY): Payer: Self-pay

## 2012-05-04 ENCOUNTER — Emergency Department (HOSPITAL_COMMUNITY): Payer: Medicare Other

## 2012-05-04 DIAGNOSIS — I4891 Unspecified atrial fibrillation: Secondary | ICD-10-CM | POA: Diagnosis not present

## 2012-05-04 DIAGNOSIS — I10 Essential (primary) hypertension: Secondary | ICD-10-CM | POA: Diagnosis not present

## 2012-05-04 DIAGNOSIS — E876 Hypokalemia: Secondary | ICD-10-CM | POA: Diagnosis not present

## 2012-05-04 DIAGNOSIS — R918 Other nonspecific abnormal finding of lung field: Secondary | ICD-10-CM | POA: Diagnosis not present

## 2012-05-04 DIAGNOSIS — E785 Hyperlipidemia, unspecified: Secondary | ICD-10-CM | POA: Diagnosis present

## 2012-05-04 DIAGNOSIS — R0789 Other chest pain: Secondary | ICD-10-CM | POA: Diagnosis present

## 2012-05-04 DIAGNOSIS — Z79899 Other long term (current) drug therapy: Secondary | ICD-10-CM | POA: Diagnosis not present

## 2012-05-04 DIAGNOSIS — Z7901 Long term (current) use of anticoagulants: Secondary | ICD-10-CM | POA: Diagnosis not present

## 2012-05-04 DIAGNOSIS — I517 Cardiomegaly: Secondary | ICD-10-CM | POA: Diagnosis not present

## 2012-05-04 LAB — CBC
HCT: 35.8 % — ABNORMAL LOW (ref 36.0–46.0)
HCT: 38.6 % (ref 36.0–46.0)
Hemoglobin: 12.3 g/dL (ref 12.0–15.0)
MCH: 25.1 pg — ABNORMAL LOW (ref 26.0–34.0)
MCHC: 31.6 g/dL (ref 30.0–36.0)
MCHC: 31.9 g/dL (ref 30.0–36.0)
MCV: 78.6 fL (ref 78.0–100.0)
MCV: 78.9 fL (ref 78.0–100.0)
Platelets: 200 10*3/uL (ref 150–400)
RDW: 16.3 % — ABNORMAL HIGH (ref 11.5–15.5)
RDW: 16.5 % — ABNORMAL HIGH (ref 11.5–15.5)
WBC: 6.6 10*3/uL (ref 4.0–10.5)

## 2012-05-04 LAB — COMPREHENSIVE METABOLIC PANEL
Albumin: 3.2 g/dL — ABNORMAL LOW (ref 3.5–5.2)
BUN: 19 mg/dL (ref 6–23)
Calcium: 9.6 mg/dL (ref 8.4–10.5)
Creatinine, Ser: 0.65 mg/dL (ref 0.50–1.10)
GFR calc Af Amer: >90 mL/min (ref >90)
Glucose, Bld: 134 mg/dL — ABNORMAL HIGH (ref 70–99)
Total Protein: 8.1 g/dL (ref 6.0–8.3)

## 2012-05-04 LAB — BASIC METABOLIC PANEL
BUN: 18 mg/dL (ref 6–23)
CO2: 28 mEq/L (ref 19–32)
Calcium: 9.1 mg/dL (ref 8.4–10.5)
Creatinine, Ser: 0.63 mg/dL (ref 0.50–1.10)
GFR calc Af Amer: >90 mL/min (ref >90)

## 2012-05-04 LAB — MAGNESIUM: Magnesium: 2.3 mg/dL (ref 1.5–2.5)

## 2012-05-04 LAB — APTT: aPTT: 45 seconds — ABNORMAL HIGH (ref 24–37)

## 2012-05-04 LAB — POCT I-STAT TROPONIN I: Troponin i, poc: 0 ng/mL (ref 0.00–0.08)

## 2012-05-04 LAB — PROTIME-INR: Prothrombin Time: 16 seconds — ABNORMAL HIGH (ref 11.6–15.2)

## 2012-05-04 LAB — CK TOTAL AND CKMB (NOT AT ARMC): Relative Index: INVALID (ref 0.0–2.5)

## 2012-05-04 MED ORDER — ALUM & MAG HYDROXIDE-SIMETH 200-200-20 MG/5ML PO SUSP: 30.0000 mL | Freq: Four times a day (QID) | ORAL | Status: DC | PRN

## 2012-05-04 MED ORDER — MORPHINE SULFATE 2 MG/ML IJ SOLN
2.0000 mg | Freq: Once | INTRAMUSCULAR | Status: AC
  Administered 2012-05-04: 2 mg via INTRAVENOUS
  Filled 2012-05-04: qty 1

## 2012-05-04 MED ORDER — ONDANSETRON HCL 4 MG PO TABS: 4.0000 mg | ORAL_TABLET | Freq: Four times a day (QID) | ORAL | Status: DC | PRN

## 2012-05-04 MED ORDER — POTASSIUM CHLORIDE 10 MEQ/100ML IV SOLN
10.0000 meq | INTRAVENOUS | Status: AC
  Administered 2012-05-04 (×4): 10 meq via INTRAVENOUS
  Filled 2012-05-04 (×4): qty 100

## 2012-05-04 MED ORDER — OXYCODONE HCL 5 MG PO TABS: 5.0000 mg | ORAL_TABLET | ORAL | Status: DC | PRN

## 2012-05-04 MED ORDER — POTASSIUM CHLORIDE IN NACL 40-0.9 MEQ/L-% IV SOLN
INTRAVENOUS | Status: DC
  Filled 2012-05-04 (×3): qty 1000

## 2012-05-04 MED ORDER — ZOLPIDEM TARTRATE 5 MG PO TABS: 5.0000 mg | ORAL_TABLET | Freq: Every evening | ORAL | Status: DC | PRN

## 2012-05-04 MED ORDER — SIMVASTATIN 5 MG PO TABS
5.0000 mg | ORAL_TABLET | Freq: Every day | ORAL | Status: DC
  Filled 2012-05-04: qty 1

## 2012-05-04 MED ORDER — POTASSIUM CHLORIDE ER 10 MEQ PO TBCR: 10.0000 meq | EXTENDED_RELEASE_TABLET | Freq: Two times a day (BID) | ORAL | Status: DC

## 2012-05-04 MED ORDER — CAPTOPRIL 50 MG PO TABS
50.0000 mg | ORAL_TABLET | Freq: Two times a day (BID) | ORAL | Status: DC
  Administered 2012-05-04: 50 mg via ORAL
  Filled 2012-05-04 (×2): qty 1

## 2012-05-04 MED ORDER — AMLODIPINE BESYLATE 2.5 MG PO TABS
2.5000 mg | ORAL_TABLET | Freq: Every day | ORAL | Status: DC
  Administered 2012-05-04: 2.5 mg via ORAL
  Filled 2012-05-04: qty 1

## 2012-05-04 MED ORDER — ATENOLOL 50 MG PO TABS
50.0000 mg | ORAL_TABLET | Freq: Two times a day (BID) | ORAL | Status: DC
  Administered 2012-05-04: 50 mg via ORAL
  Filled 2012-05-04 (×2): qty 1

## 2012-05-04 MED ORDER — ACETAMINOPHEN 650 MG RE SUPP: 650.0000 mg | Freq: Four times a day (QID) | RECTAL | Status: DC | PRN

## 2012-05-04 MED ORDER — ASPIRIN 81 MG PO CHEW
324.0000 mg | CHEWABLE_TABLET | Freq: Once | ORAL | Status: AC
  Administered 2012-05-04: 324 mg via ORAL
  Filled 2012-05-04: qty 4

## 2012-05-04 MED ORDER — ACETAMINOPHEN 325 MG PO TABS
650.0000 mg | ORAL_TABLET | Freq: Four times a day (QID) | ORAL | Status: DC | PRN
  Administered 2012-05-04: 650 mg via ORAL
  Filled 2012-05-04: qty 2

## 2012-05-04 MED ORDER — VITAMIN D3 25 MCG (1000 UT) PO CAPS: 1.0000 | ORAL_CAPSULE | Freq: Every day | ORAL | Status: DC

## 2012-05-04 MED ORDER — NITROGLYCERIN 2 % TD OINT
1.0000 [in_us] | TOPICAL_OINTMENT | Freq: Once | TRANSDERMAL | Status: AC
  Administered 2012-05-04: 1 [in_us] via TOPICAL
  Filled 2012-05-04: qty 1

## 2012-05-04 MED ORDER — AMLODIPINE BESYLATE-VALSARTAN 5-320 MG PO TABS: 0.5000 | ORAL_TABLET | Freq: Every day | ORAL | Status: DC

## 2012-05-04 MED ORDER — ENOXAPARIN SODIUM 40 MG/0.4ML ~~LOC~~ SOLN
40.0000 mg | SUBCUTANEOUS | Status: DC
  Filled 2012-05-04: qty 0.4

## 2012-05-04 MED ORDER — HYDROMORPHONE HCL PF 1 MG/ML IJ SOLN: 0.5000 mg | INTRAMUSCULAR | Status: DC | PRN

## 2012-05-04 MED ORDER — ONDANSETRON HCL 4 MG/2ML IJ SOLN: 4.0000 mg | Freq: Four times a day (QID) | INTRAMUSCULAR | Status: DC | PRN

## 2012-05-04 MED ORDER — EZETIMIBE 10 MG PO TABS
10.0000 mg | ORAL_TABLET | Freq: Every day | ORAL | Status: DC
  Administered 2012-05-04: 10 mg via ORAL
  Filled 2012-05-04: qty 1

## 2012-05-04 MED ORDER — IRBESARTAN 150 MG PO TABS
150.0000 mg | ORAL_TABLET | Freq: Every day | ORAL | Status: DC
  Administered 2012-05-04: 150 mg via ORAL
  Filled 2012-05-04: qty 1

## 2012-05-04 MED ORDER — POTASSIUM CHLORIDE CRYS ER 20 MEQ PO TBCR
40.0000 meq | EXTENDED_RELEASE_TABLET | Freq: Once | ORAL | Status: AC
  Administered 2012-05-04: 40 meq via ORAL
  Filled 2012-05-04: qty 2

## 2012-05-04 MED ORDER — VITAMIN D3 25 MCG (1000 UNIT) PO TABS
1000.0000 [IU] | ORAL_TABLET | Freq: Every day | ORAL | Status: DC
  Administered 2012-05-04: 1000 [IU] via ORAL
  Filled 2012-05-04: qty 1

## 2012-05-04 MED ORDER — SODIUM CHLORIDE 0.9 % IV SOLN
Freq: Once | INTRAVENOUS | Status: AC
  Administered 2012-05-04: 01:00:00 via INTRAVENOUS

## 2012-05-04 NOTE — Discharge Summary (Signed)
Physician Discharge Summary  Patient ID: Kari [name] MRN: [id number] DOB/AGE: 03-30-1934 78 y.o.  Admit date: 05/03/2012 Discharge date: 05/04/2012  Primary Care Physician:  Rudi Heap, MD   Discharge Diagnoses:    Principal Problem:  *Hypokalemia Active Problems:  HYPERLIPIDEMIA  HYPERTENSION  AF (atrial fibrillation)  Atypical chest pain    Medication List  As of 05/04/2012 11:24 AM   STOP taking these medications         hydrochlorothiazide 12.5 MG capsule         TAKE these medications         amLODipine-valsartan 5-320 MG per tablet   Commonly known as: EXFORGE   Take 0.5 tablets by mouth daily.      atenolol 50 MG tablet   Commonly known as: TENORMIN   Take 50 mg by mouth 2 (two) times daily.      captopril 50 MG tablet   Commonly known as: CAPOTEN   Take 50 mg by mouth 2 (two) times daily.      ezetimibe 10 MG tablet   Commonly known as: ZETIA   Take 10 mg by mouth daily.      potassium chloride 10 MEQ tablet   Commonly known as: K-DUR   Take 1 tablet (10 mEq total) by mouth 2 (two) times daily. Take 2 tablets today, Monday and Tuesday      pravastatin 40 MG tablet   Commonly known as: PRAVACHOL   Take 80 mg by mouth daily.      Vitamin D3 1000 UNITS Caps   Take 1 capsule by mouth daily.      warfarin 2.5 MG tablet   Commonly known as: COUMADIN   Take 1.25-2.5 mg by mouth See admin instructions. Takes 1 tablet Sunday, Monday, Wednesday and Thursday. Takes 0.5 tablet Tuesday, Friday and Saturday.             Disposition and Follow-up:  Patient will be discharged home today in stable and improved condition. She is instructed to take 20 mEq of potassium today, Monday and Tuesday and then followup with her PCP on Tuesday for repeat potassium check.  Consults:  None    Significant Diagnostic Studies:  Dg Chest Portable 1 View  05/04/2012  *RADIOLOGY REPORT*  Clinical Data: Back pain.  PORTABLE CHEST - 1 VIEW  Comparison: None.  Findings:  The heart is mildly enlarged.  There is atelectasis versus infiltrate at the left lung base.  No edema.  Visualized bony structures are unremarkable.  IMPRESSION: Mild cardiomegaly.  Atelectasis versus infiltrate at the left lung base.   Original Report Authenticated By: Reola Calkins, M.D.     Brief H and P: For complete details please refer to admission H and P, but in brief 78 y.o. female who presented to the ED with complaints of pain which started in her left leg and extended upward into her left side, and then she began to have dull pain in her left chest and shoulder. She denied having any SOB, nausea vomiting diarrhea or diaphoresis. In the ED she was evaluated and found to have a K+ level of 2.6. She was referred for medical admission.      Hospital Course:  Principal Problem:  *Hypokalemia Active Problems:  HYPERLIPIDEMIA  HYPERTENSION  AF (atrial fibrillation)  Atypical chest pain   #1 leg, shoulder pain: Patient to me denies chest pain. 2 sets of cardiac enzymes so far have been negative. I suspect that this is all related to  hypokalemia as her leg pain has resolved with potassium repletion. She has been ambulating without difficulty. Is anxious to go home today.  #2 hypokalemia: Is currently receiving the third out of her fourth potassium runs. Her magnesium was checked and was normal at 2.3. She is anxious to go home today so I will discharge her after her potassium runs are complete. She is instructed to take 20 mEq of potassium today, Monday and Tuesday and followup with her primary care doctor for repeat potassium check. I have discontinued her hydrochlorothiazide at this time.  All rest of chronic medical conditions have been stable and her home medications have not been altered with the exception of discontinuing her hydrochlorothiazide.  Time spent on Discharge: Greater than 30 minutes.  SignedChaya Jan [facility] Pager:  [phone] 05/04/2012, 11:24 AM

## 2012-05-04 NOTE — ED Provider Notes (Addendum)
History     CSN: [id number]  Arrival date & time 05/03/12  1627   First MD Initiated Contact with Patient 05/03/12 2352      Chief Complaint  Patient presents with  . Leg Pain    PT has an appt. on 05-06-12 for the leg pain.    (Consider location/radiation/quality/duration/timing/severity/associated sxs/prior treatment) HPI  This patient is a very pleasant 78 year old woman who is brought to the emergency department by her son and husband. The patient presents with complaints of left shoulder pain which radiates to the left lateral chest. Her pain began yesterday and was initially present only with exertion. It has been more persistent today and presents at rest as well. The patient denies any history of similar symptoms. She denies trauma. She denies paresthesias and motor weakness. She has not had any shortness of breath or cough.  The patient has a history of atrial fibrillation and is anticoagulated with warfarin. Although, this medication is currently being held in anticipation of a procedure. The patient has no history of coronary artery disease. She said that she last underwent BX stress test several years ago and was told that the study was normal. Patient rates her pain 8/10.  Past Medical History  Diagnosis Date  . Obesity   . Symptomatic menopausal or female climacteric states   . Kyphosis   . Gallstones   . Adenomatous polyps   . AF (atrial fibrillation)   . Hyperlipidemia     x5 years  . Hypertension     x5 years  . Hx of adenomatous colonic polyps     Past Surgical History  Procedure Date  . Vaginal hysterectomy   . Bladder suspension   . Colonoscopy multiple    Family History  Problem Relation Age of Onset  . Heart attack Mother   . Stroke Mother   . Heart attack Father   . Alcohol abuse Father   . Heart attack Other   . Stroke Other   . Breast cancer Other   . Colon cancer Neg Hx     History  Substance Use Topics  . Smoking status: Never Smoker    . Smokeless tobacco: Never Used  . Alcohol Use: No    OB History    Grav Para Term Preterm Abortions TAB SAB Ect Mult Living                  Review of Systems Gen: no weight loss, fevers, chills, night sweats Eyes: no discharge or drainage, no occular pain or visual changes Nose: no epistaxis or rhinorrhea Mouth: no dental pain, no sore throat Neck: no neck pain Lungs: no SOB, cough, wheezing CV: As per history of present illness, otherwise negative Abd: no abdominal pain, nausea, vomiting GU: no dysuria or gross hematuria MSK: As per history of present illness, patient also has chronic pain in the left SI region. Neuro: no headache, no focal neurologic deficits Skin: no rash Psyche: negative.  Allergies  Erythromycin; Ezetimibe-simvastatin; and Penicillins  Home Medications   Current Outpatient Rx  Name Route Sig Dispense Refill  . AMLODIPINE BESYLATE-VALSARTAN 5-320 MG PO TABS Oral Take 0.5 tablets by mouth daily.     . ATENOLOL 50 MG PO TABS Oral Take 50 mg by mouth 2 (two) times daily.     Marland Kitchen CAPTOPRIL 50 MG PO TABS Oral Take 50 mg by mouth 2 (two) times daily.     Marland Kitchen VITAMIN D3 1000 UNITS PO CAPS Oral Take 1 capsule  by mouth daily.     Marland Kitchen EZETIMIBE 10 MG PO TABS Oral Take 10 mg by mouth daily.     Marland Kitchen HYDROCHLOROTHIAZIDE 12.5 MG PO CAPS Oral Take 12.5 mg by mouth daily.     Marland Kitchen PRAVASTATIN SODIUM 40 MG PO TABS Oral Take 80 mg by mouth daily.     . WARFARIN SODIUM 2.5 MG PO TABS Oral Take 1.25-2.5 mg by mouth See admin instructions. Takes 1 tablet Sunday, Monday, Wednesday and Thursday. Takes 0.5 tablet Tuesday, Friday and Saturday.      BP 124/78  Pulse 96  Temp 98.3 F (36.8 C) (Oral)  Resp 18  SpO2 96%  Physical Exam  Gen: well developed and well nourished appearing Head: NCAT Eyes: PERL, EOMI Nose: no epistaixis or rhinorrhea Mouth/throat: mucosa is moist and pink Neck: supple, no stridor Lungs: CTA B, no wheezing, rhonchi or rales CV: Irregularly  irregular, no murmurs, palpable extremity pulses, skin appears well perfused, no LE edema Abd: soft, mildly obese, notender, nondistended Back: no ttp, no cva ttp, mild kyphosis Skin: no rashese, wnl Neuro: CN ii-xii grossly intact, no focal deficits Ext: left shoulder is nontender and there is painless and full ROS at this joint, distal neurovascular exam is normal.  Psyche; normal affect,  calm and cooperative.  ED Course  Procedures (including critical care time)  Labs Reviewed  CBC - Abnormal; Notable for the following:    MCH 25.1 (*)     RDW 16.3 (*)     All other components within normal limits  COMPREHENSIVE METABOLIC PANEL - Abnormal; Notable for the following:    Potassium 2.6 (*)     Glucose, Bld 134 (*)     Albumin 3.2 (*)     GFR calc non Af Amer 84 (*)     All other components within normal limits  PROTIME-INR - Abnormal; Notable for the following:    Prothrombin Time 16.0 (*)     All other components within normal limits  APTT - Abnormal; Notable for the following:    aPTT 45 (*)     All other components within normal limits  MAGNESIUM  POCT I-STAT TROPONIN I  CULTURE, BLOOD (ROUTINE X 2)  CULTURE, BLOOD (ROUTINE X 2)   Dg Chest Portable 1 View  05/04/2012  *RADIOLOGY REPORT*  Clinical Data: Back pain.  PORTABLE CHEST - 1 VIEW  Comparison: None.  Findings: The heart is mildly enlarged.  There is atelectasis versus infiltrate at the left lung base.  No edema.  Visualized bony structures are unremarkable.  IMPRESSION: Mild cardiomegaly.  Atelectasis versus infiltrate at the left lung base.   Original Report Authenticated By: Reola Calkins, M.D.      EKG: atrial fibrillation, no acute ischemic changes, normal axis, LVH with pattern of left heart strain which appears new compared to most recent from 2000.     MDM    Patient with atypical sounding but worrisome chest pain with multiple CAD RF and no history of recent cardiac work up. CXR shows questioanble LLL  infiltrate although, I think we are more likely looking at some atlectasis. The patient has no sx of pna, no fever, normal WBC. NO pleural effusion. Do not suspect PE.  We have treated patient with ASA, MS, NTG. She is feeling better. She will be admitted for further cardiac evaluation.  On call IM for unassigned patients paged approx 67m ago.    Case discussed with Dr. Lovell Sheehan who will admit.  Brandt Loosen, MD 05/04/12 0149  Brandt Loosen, MD 05/04/12 [id number]

## 2012-05-04 NOTE — H&P (Signed)
[facility] History and Physical  OPHA MCGHEE OZH:[id number] DOB: [birth year] DOA: 05/03/2012   Referring physician:  PCP: Rudi Heap, MD   Chief Complaint:  Leg and Chest Pain  HPI: Kari [name] is a 78 y.o. female who presented to the ED with complaints of pain which started in her left leg and extended upward into her left side, and then she began to have dull pain in her left chest and shoulder.  She denied having any SOB, nausea vomiting diarrhea or diaphoresis.  In the ED she was evaluated and found to have a K+ level of 2.6.  She was referred for medical admission.     Review of Systems: The patient denies anorexia, fever, weight loss, vision loss, decreased hearing, hoarseness, syncope, dyspnea on exertion, peripheral edema, balance deficits, hemoptysis, abdominal pain, melena, hematochezia, severe indigestion/heartburn, hematuria, incontinence, genital sores, muscle weakness, suspicious skin lesions, transient blindness, difficulty walking, depression, unusual weight change, abnormal bleeding, enlarged lymph nodes, angioedema, and breast masses.    Past Medical History  Diagnosis Date  . Obesity   . Symptomatic menopausal or female climacteric states   . Kyphosis   . Gallstones   . Adenomatous polyps   . AF (atrial fibrillation)   . Hyperlipidemia     x5 years  . Hypertension     x5 years  . Hx of adenomatous colonic polyps     Past Surgical History  Procedure Date  . Vaginal hysterectomy   . Bladder suspension   . Colonoscopy multiple    Medications:  HOME MEDS: Prior to Admission medications   Medication Sig Start Date End Date Taking? Authorizing Provider  amLODipine-valsartan (EXFORGE) 5-320 MG per tablet Take 0.5 tablets by mouth daily.    Yes Historical Provider, MD  atenolol (TENORMIN) 50 MG tablet Take 50 mg by mouth 2 (two) times daily.    Yes Historical Provider, MD  captopril (CAPOTEN) 50 MG tablet Take 50 mg by mouth 2 (two) times daily.     Yes Historical Provider, MD  Cholecalciferol (VITAMIN D3) 1000 UNITS CAPS Take 1 capsule by mouth daily.    Yes Historical Provider, MD  ezetimibe (ZETIA) 10 MG tablet Take 10 mg by mouth daily.    Yes Historical Provider, MD  hydrochlorothiazide (,MICROZIDE/HYDRODIURIL,) 12.5 MG capsule Take 12.5 mg by mouth daily.    Yes Historical Provider, MD  pravastatin (PRAVACHOL) 40 MG tablet Take 80 mg by mouth daily.    Yes Historical Provider, MD  warfarin (COUMADIN) 2.5 MG tablet Take 1.25-2.5 mg by mouth See admin instructions. Takes 1 tablet Sunday, Monday, Wednesday and Thursday. Takes 0.5 tablet Tuesday, Friday and Saturday.   Yes Historical Provider, MD    Allergies  Allergen Reactions  . Erythromycin (Erythromycin)   . Ezetimibe-Simvastatin   . Penicillins     Social History:  Married, and lives at home with her husband and Son.  She reports that she has never smoked. She has never used smokeless tobacco. She reports that she does not drink alcohol or use illicit drugs.     Family History  Problem Relation Age of Onset  . Heart attack Mother   . Stroke Mother   . Heart attack Father   . Alcohol abuse Father   . Heart attack Other   . Stroke Other   . Breast cancer Other   . Colon cancer Neg Hx      Physical Exam:    GEN:  Pleasant 78 year old well nourished and  well developed Caucasian Female examined  and in no acute distress; cooperative with exam Filed Vitals:   05/04/12 0130 05/04/12 0215 05/04/12 0245 05/04/12 0330  BP: 107/64 100/56 97/51 110/56  Pulse: 90 56 77 60  Temp:      TempSrc:      Resp: 19 15 18 17   SpO2: 95% 96% 95% 95%   Blood pressure 110/56, pulse 60, temperature 98.3 F (36.8 C), temperature source Oral, resp. rate 17, SpO2 95.00%. PSYCH: She is alert and oriented x4; does not appear anxious does not appear depressed; affect is normal HEENT: Normocephalic and Atraumatic, Mucous membranes pink; PERRLA; EOM intact; Fundi:  Benign;  No scleral icterus,  Nares: Patent, Oropharynx: Clear, Fair Dentition, Neck:  FROM, no cervical lymphadenopathy nor thyromegaly or carotid bruit; no JVD; Breasts:: Not examined CHEST WALL: No tenderness CHEST: Normal respiration, clear to auscultation bilaterally HEART: Regular rate and rhythm; no murmurs rubs or gallops BACK: No kyphosis or scoliosis; no CVA tenderness ABDOMEN: Positive Bowel Sounds, soft non-tender; no masses, no organomegaly, no pannus; no intertriginous candida. Rectal Exam: Not done EXTREMITIES: No bone or joint deformity; age-appropriate arthropathy of the hands and knees; no cyanosis, clubbing or edema; no ulcerations. Genitalia: not examined PULSES: 2+ and symmetric SKIN: Normal hydration no rash or ulceration CNS: Cranial nerves 2-12 grossly intact no focal neurologic deficit    Labs on Admission:  Basic Metabolic Panel:  Lab 05/04/12 4540  NA 136  K 2.6*  CL 98  CO2 29  GLUCOSE 134*  BUN 19  CREATININE 0.65  CALCIUM 9.6  MG 2.3  PHOS --   Liver Function Tests:  Lab 05/04/12 0043  AST 18  ALT 9  ALKPHOS 83  BILITOT 0.9  PROT 8.1  ALBUMIN 3.2*    CBC:  Lab 05/04/12 0043  WBC 8.2  NEUTROABS --  HGB 12.3  HCT 38.6  MCV 78.6  PLT 216   Cardiac Enzymes: No results found for this basename: CKTOTAL:5,CKMB:5,CKMBINDEX:5,TROPONINI:5 in the last 168 hours  BNP (last 3 results) No results found for this basename: PROBNP:3 in the last 8760 hours CBG: No results found for this basename: GLUCAP:5 in the last 168 hours  Radiological Exams on Admission: Dg Chest Portable 1 View  05/04/2012  *RADIOLOGY REPORT*  Clinical Data: Back pain.  PORTABLE CHEST - 1 VIEW  Comparison: None.  Findings: The heart is mildly enlarged.  There is atelectasis versus infiltrate at the left lung base.  No edema.  Visualized bony structures are unremarkable.  IMPRESSION: Mild cardiomegaly.  Atelectasis versus infiltrate at the left lung base.   Original Report Authenticated By: Reola Calkins, M.D.     EKG: Independently reviewed, Rate controlled Atrial fibrillation, No acute ST changes  Assessment: Principal Problem:  *Hypokalemia Active Problems:  AF (atrial fibrillation)  Atypical chest pain  HYPERTENSION  HYPERLIPIDEMIA    Plan:    Admit to Telemetry Bed Replete K+ Cardiac Enzymes ordered Nitropaste, O2 and ASA therapy ordered IVFs Pain Control Reconcile Home Medications DVT prophylaxis    Code Status:  FULL CODE Disposition Plan: Return to Home  Time spent: 60 Minutes  Ron Parker [facility] Pager [phone]  If 7PM-7AM, please contact night-coverage [email or web address] Password [email or web address] 05/04/2012, 5:07 AM

## 2012-05-06 DIAGNOSIS — Z7901 Long term (current) use of anticoagulants: Secondary | ICD-10-CM | POA: Diagnosis not present

## 2012-05-06 DIAGNOSIS — I4891 Unspecified atrial fibrillation: Secondary | ICD-10-CM | POA: Diagnosis not present

## 2012-05-06 DIAGNOSIS — M48061 Spinal stenosis, lumbar region without neurogenic claudication: Secondary | ICD-10-CM | POA: Diagnosis not present

## 2012-05-06 DIAGNOSIS — E875 Hyperkalemia: Secondary | ICD-10-CM | POA: Diagnosis not present

## 2012-05-06 DIAGNOSIS — I10 Essential (primary) hypertension: Secondary | ICD-10-CM | POA: Diagnosis not present

## 2012-05-10 LAB — CULTURE, BLOOD (ROUTINE X 2): Culture: NO GROWTH

## 2012-05-23 DIAGNOSIS — E876 Hypokalemia: Secondary | ICD-10-CM | POA: Diagnosis not present

## 2012-05-23 DIAGNOSIS — I4891 Unspecified atrial fibrillation: Secondary | ICD-10-CM | POA: Diagnosis not present

## 2012-05-23 DIAGNOSIS — M545 Low back pain: Secondary | ICD-10-CM | POA: Diagnosis not present

## 2012-05-23 DIAGNOSIS — Z23 Encounter for immunization: Secondary | ICD-10-CM | POA: Diagnosis not present

## 2012-06-19 DIAGNOSIS — I4891 Unspecified atrial fibrillation: Secondary | ICD-10-CM | POA: Diagnosis not present

## 2012-07-16 DIAGNOSIS — M810 Age-related osteoporosis without current pathological fracture: Secondary | ICD-10-CM | POA: Diagnosis not present

## 2012-07-21 DIAGNOSIS — E785 Hyperlipidemia, unspecified: Secondary | ICD-10-CM | POA: Diagnosis not present

## 2012-07-21 DIAGNOSIS — I10 Essential (primary) hypertension: Secondary | ICD-10-CM | POA: Diagnosis not present

## 2012-08-04 DIAGNOSIS — E559 Vitamin D deficiency, unspecified: Secondary | ICD-10-CM | POA: Diagnosis not present

## 2012-08-04 DIAGNOSIS — E785 Hyperlipidemia, unspecified: Secondary | ICD-10-CM | POA: Diagnosis not present

## 2012-08-04 DIAGNOSIS — I4891 Unspecified atrial fibrillation: Secondary | ICD-10-CM | POA: Diagnosis not present

## 2012-08-04 DIAGNOSIS — I10 Essential (primary) hypertension: Secondary | ICD-10-CM | POA: Diagnosis not present

## 2012-09-08 DIAGNOSIS — I4891 Unspecified atrial fibrillation: Secondary | ICD-10-CM | POA: Diagnosis not present

## 2012-10-01 ENCOUNTER — Emergency Department (HOSPITAL_COMMUNITY): Payer: Medicare Other

## 2012-10-01 ENCOUNTER — Encounter (HOSPITAL_COMMUNITY): Payer: Self-pay | Admitting: Emergency Medicine

## 2012-10-01 ENCOUNTER — Emergency Department (HOSPITAL_COMMUNITY)
Admission: EM | Admit: 2012-10-01 | Discharge: 2012-10-02 | Disposition: A | Discharge status: Home / Self Care | Payer: Medicare Other | Attending: Emergency Medicine | Admitting: Emergency Medicine

## 2012-10-01 DIAGNOSIS — Z860100 Personal history of colon polyps, unspecified: Secondary | ICD-10-CM | POA: Insufficient documentation

## 2012-10-01 DIAGNOSIS — Z8719 Personal history of other diseases of the digestive system: Secondary | ICD-10-CM | POA: Diagnosis not present

## 2012-10-01 DIAGNOSIS — Z8742 Personal history of other diseases of the female genital tract: Secondary | ICD-10-CM | POA: Diagnosis not present

## 2012-10-01 DIAGNOSIS — Z8679 Personal history of other diseases of the circulatory system: Secondary | ICD-10-CM | POA: Insufficient documentation

## 2012-10-01 DIAGNOSIS — Z7901 Long term (current) use of anticoagulants: Secondary | ICD-10-CM | POA: Insufficient documentation

## 2012-10-01 DIAGNOSIS — Z79899 Other long term (current) drug therapy: Secondary | ICD-10-CM | POA: Insufficient documentation

## 2012-10-01 DIAGNOSIS — M545 Low back pain, unspecified: Secondary | ICD-10-CM | POA: Insufficient documentation

## 2012-10-01 DIAGNOSIS — E669 Obesity, unspecified: Secondary | ICD-10-CM | POA: Insufficient documentation

## 2012-10-01 DIAGNOSIS — E785 Hyperlipidemia, unspecified: Secondary | ICD-10-CM | POA: Diagnosis not present

## 2012-10-01 DIAGNOSIS — M47817 Spondylosis without myelopathy or radiculopathy, lumbosacral region: Secondary | ICD-10-CM | POA: Diagnosis not present

## 2012-10-01 DIAGNOSIS — I10 Essential (primary) hypertension: Secondary | ICD-10-CM | POA: Diagnosis not present

## 2012-10-01 MED ORDER — HYDROMORPHONE HCL PF 1 MG/ML IJ SOLN
0.5000 mg | Freq: Once | INTRAMUSCULAR | Status: AC
  Administered 2012-10-01: 0.5 mg via INTRAMUSCULAR
  Filled 2012-10-01: qty 1

## 2012-10-01 NOTE — ED Notes (Signed)
Pt states she began having low R back pain onset 1930 tonight while eating dinner, denies trauma. Denies fever, denies urinary s/s.

## 2012-10-01 NOTE — ED Provider Notes (Signed)
History     CSN: [id number]  Arrival date & time 10/01/12  2209   First MD Initiated Contact with Patient 10/01/12 2258      Chief Complaint  Patient presents with  . Back Pain    (Consider location/radiation/quality/duration/timing/severity/associated sxs/prior treatment) HPI Hx per PT. R LBP onset tonight while out to dinner with family, no trauma, pain is sharp with with movement, mod to severe, denies any h/o back pain, but in the remote past had to have lumbar injections for R leg pains. She had 3 series and that was about 2 years ago. No F/C, no weakness, numbness, no incontinence. No h/o same. No diff urinating. Past Medical History  Diagnosis Date  . Obesity   . Symptomatic menopausal or female climacteric states   . Kyphosis   . Gallstones   . Adenomatous polyps   . AF (atrial fibrillation)   . Hyperlipidemia     x5 years  . Hypertension     x5 years  . Hx of adenomatous colonic polyps     Past Surgical History  Procedure Date  . Vaginal hysterectomy   . Bladder suspension   . Colonoscopy multiple    Family History  Problem Relation Age of Onset  . Heart attack Mother   . Stroke Mother   . Heart attack Father   . Alcohol abuse Father   . Heart attack Other   . Stroke Other   . Breast cancer Other   . Colon cancer Neg Hx     History  Substance Use Topics  . Smoking status: Never Smoker   . Smokeless tobacco: Never Used  . Alcohol Use: No    OB History    Grav Para Term Preterm Abortions TAB SAB Ect Mult Living                  Review of Systems  Constitutional: Negative for fever and chills.  HENT: Negative for neck pain and neck stiffness.   Eyes: Negative for pain.  Respiratory: Negative for shortness of breath.   Gastrointestinal: Negative for abdominal pain.  Genitourinary: Negative for dysuria.  Musculoskeletal: Positive for back pain.  Skin: Negative for rash.  Neurological: Negative for headaches.  All other systems reviewed and  are negative.    Allergies  Erythromycin and Penicillins  Home Medications   Current Outpatient Rx  Name  Route  Sig  Dispense  Refill  . AMLODIPINE BESYLATE-VALSARTAN 5-320 MG PO TABS   Oral   Take 0.5 tablets by mouth every morning.          . ATENOLOL 50 MG PO TABS   Oral   Take 50 mg by mouth 2 (two) times daily.          Marland Kitchen CAPTOPRIL 50 MG PO TABS   Oral   Take 50 mg by mouth 2 (two) times daily.          Marland Kitchen VITAMIN D3 1000 UNITS PO CAPS   Oral   Take 1 capsule by mouth every morning.          Marland Kitchen EZETIMIBE 10 MG PO TABS   Oral   Take 10 mg by mouth every evening.          Marland Kitchen HYDROCHLOROTHIAZIDE 12.5 MG PO CAPS   Oral   Take 12.5 mg by mouth every morning.         Marland Kitchen PRAVASTATIN SODIUM 80 MG PO TABS   Oral   Take 80 mg by  mouth every evening.         . WARFARIN SODIUM 2.5 MG PO TABS   Oral   Take 1.25-2.5 mg by mouth every evening. Take 0.5 tablet on Friday and Sunday. Take 1 tablet all other days of the week           BP 171/90  Pulse 85  Temp 97.7 F (36.5 C) (Oral)  Resp 20  Ht 5\' 1"  (1.549 m)  Wt 147 lb (66.679 kg)  BMI 27.78 kg/m2  SpO2 98%  Physical Exam  Constitutional: She is oriented to person, place, and time. She appears well-developed and well-nourished.  HENT:  Head: Normocephalic and atraumatic.  Eyes: EOM are normal. Pupils are equal, round, and reactive to light.  Neck: Neck supple.  Cardiovascular: Normal rate, regular rhythm and intact distal pulses.   Pulmonary/Chest: Effort normal and breath sounds normal. No respiratory distress.  Abdominal: Soft. Bowel sounds are normal. She exhibits no distension. There is no tenderness. There is no guarding.       No CVAT  Musculoskeletal: Normal range of motion. She exhibits no edema and no tenderness.       Localizes pain to R sciatic area.  No reproducible tenderness with palpation, no midline tenderness. No LE deficits with equal DTRs, strength, sensorium to light touch  intact  Neurological: She is alert and oriented to person, place, and time.  Skin: Skin is warm and dry.    ED Course  Procedures (including critical care time)  Dg Lumbar Spine Complete  10/02/2012  *RADIOLOGY REPORT*  Clinical Data: Low back pain  LUMBAR SPINE - COMPLETE 4+ VIEW  Comparison: 06/07/2008  Findings: Multilevel degenerative changes with anterior osteophytes and facet arthropathy.  Minimal anterolisthesis of L4 on L5.  No acute fracture or dislocation identified.  Mild rightward curvature of the lumbar spine.  Advanced atherosclerotic disease.  IMPRESSION: Multilevel degenerative changes.  No acute osseous finding.   Original Report Authenticated By: Jearld Lesch, M.D.     IM Dilaudid  Recheck: pain improved, PT ambulating to the bathroom minimal pain. She wants to be discharged home. LBP precautions provided, plan PCP follow up with RX provided.   MDM  Low back pain without recalled trauma in elderly female with x-ray obtained and reviewed as above. Improved with IM narcotics. Vital signs nursing notes reviewed.  No concerning features of presentation to suggest emergent need for MRI at this time.        Sunnie Nielsen, MD 10/02/12 [phone]

## 2012-10-01 NOTE — ED Notes (Signed)
Pt reports she has constant pain to R side of back. Pt denies trauma to area. Pt denies numbness/tingling to extremities. Pt states she did not take anything for pain before she came into ED. Family at bedside. Warm blanket provided for comfort.

## 2012-10-02 MED ORDER — HYDROCODONE-ACETAMINOPHEN 5-325 MG PO TABS: 1.0000 | ORAL_TABLET | Freq: Four times a day (QID) | ORAL | Status: DC | PRN

## 2012-10-14 DIAGNOSIS — I4891 Unspecified atrial fibrillation: Secondary | ICD-10-CM | POA: Diagnosis not present

## 2012-11-17 DIAGNOSIS — I10 Essential (primary) hypertension: Secondary | ICD-10-CM | POA: Diagnosis not present

## 2012-11-17 DIAGNOSIS — E785 Hyperlipidemia, unspecified: Secondary | ICD-10-CM | POA: Diagnosis not present

## 2012-11-20 ENCOUNTER — Other Ambulatory Visit: Payer: Self-pay | Admitting: *Deleted

## 2012-11-20 DIAGNOSIS — E559 Vitamin D deficiency, unspecified: Secondary | ICD-10-CM

## 2012-11-24 ENCOUNTER — Other Ambulatory Visit: Payer: Self-pay | Admitting: Pharmacist

## 2012-11-24 DIAGNOSIS — I4891 Unspecified atrial fibrillation: Secondary | ICD-10-CM | POA: Insufficient documentation

## 2012-11-25 ENCOUNTER — Ambulatory Visit (INDEPENDENT_AMBULATORY_CARE_PROVIDER_SITE_OTHER): Payer: Medicare Other | Admitting: Pharmacist Clinician (PhC)/ Clinical Pharmacy Specialist

## 2012-11-25 DIAGNOSIS — I4891 Unspecified atrial fibrillation: Secondary | ICD-10-CM | POA: Diagnosis not present

## 2012-11-25 LAB — POCT INR: INR: 1.6

## 2012-12-16 ENCOUNTER — Ambulatory Visit (INDEPENDENT_AMBULATORY_CARE_PROVIDER_SITE_OTHER): Payer: Medicare Other | Admitting: Pharmacist Clinician (PhC)/ Clinical Pharmacy Specialist

## 2012-12-16 DIAGNOSIS — I4891 Unspecified atrial fibrillation: Secondary | ICD-10-CM | POA: Diagnosis not present

## 2013-02-03 ENCOUNTER — Ambulatory Visit (INDEPENDENT_AMBULATORY_CARE_PROVIDER_SITE_OTHER): Payer: Medicare Other | Admitting: Pharmacist Clinician (PhC)/ Clinical Pharmacy Specialist

## 2013-02-03 DIAGNOSIS — I4891 Unspecified atrial fibrillation: Secondary | ICD-10-CM | POA: Diagnosis not present

## 2013-03-11 ENCOUNTER — Ambulatory Visit (INDEPENDENT_AMBULATORY_CARE_PROVIDER_SITE_OTHER): Payer: Medicare Other

## 2013-03-11 ENCOUNTER — Other Ambulatory Visit: Payer: Self-pay | Admitting: Pharmacist

## 2013-03-11 ENCOUNTER — Ambulatory Visit (INDEPENDENT_AMBULATORY_CARE_PROVIDER_SITE_OTHER): Payer: Medicare Other | Admitting: Pharmacist

## 2013-03-11 VITALS — Ht 61.0 in | Wt 153.0 lb

## 2013-03-11 DIAGNOSIS — I4891 Unspecified atrial fibrillation: Secondary | ICD-10-CM

## 2013-03-11 DIAGNOSIS — E559 Vitamin D deficiency, unspecified: Secondary | ICD-10-CM | POA: Diagnosis not present

## 2013-03-11 DIAGNOSIS — M810 Age-related osteoporosis without current pathological fracture: Secondary | ICD-10-CM

## 2013-03-11 LAB — HM DEXA SCAN

## 2013-03-11 MED ORDER — ALENDRONATE SODIUM 70 MG PO TABS: 70.0000 mg | ORAL_TABLET | ORAL | Status: DC

## 2013-03-11 NOTE — Progress Notes (Signed)
Patient ID: Kari [name], female   DOB: [birth year], 79 y.o.   MRN: [id number]  [facility] Current Height: Height: 5\' 1"  (154.9 cm)      Max Lifetime Height:  5\' 2"  Current Weight: Weight: 153 lb (69.4 kg)       Ethnicity:Caucasian    HPI: Does pt already have a diagnosis of:  Osteopenia?  No Osteoporosis?  Yes  Back Pain?  No       Kyphosis?  Yes Prior fracture?  Yes -foot around 2009 Med(s) for Osteoporosis/Osteopenia:  none Med(s) previously tried for Osteoporosis/Osteopenia:  none                                                             PMH: Age at menopause:  Surgical in 50's Hysterectomy?  Yes Oophorectomy?  Yes HRT? Yes - Former.  Type/duration: premarin Steroid Use?  No Thyroid med?  No History of cancer?  No History of digestive disorders (ie Crohn's)?  No Current or previous eating disorders?  No Last Vitamin D Result: 24 (08/2012) Last GFR Result:  87 (08/2012)   FH/SH: Family history of osteoporosis?  No Parent with history of hip fracture?  No Family history of breast cancer?  No Exercise?  Yes - some walking Smoking?  No Alcohol?  No    Calcium Assessment Calcium Intake  # of servings/day  Calcium mg  Milk (8 oz) 0  x  300  = 0  Yogurt (4 oz) 3 times weekly x  200 = 100mg   Cheese (1 oz) 0 x  200 = 0  Other Calcium sources   250mg   Ca supplement none = none   Estimated calcium intake per day 350mg     DEXA Results Date of Test T-Score for AP Spine L1-L4 T-Score for Total Left Hip T-Score for Total Right Hip  03/11/2013 0.0 -1.7 -1.9  02/28/2011 0.4 -1.4 -1.5  06/21/2010 0.4 -1.3 -1.4  05/12/2008 0.8 -1.2 -1.4   **T-score for neck of left hip was -3.0 on 02/28/2011**   INR was 1.9 today  Assessment: 1. Slightly subtherapeutic anticoagulation. 2.  Osteoporosis - never started alendronate   Recommendations:  1.   Anticoagulation Dose Instructions as of 03/11/2013     Glynis Smiles Tue Wed Thu Fri Sat   New Dose 2.5 mg 1.25 mg 2.5 mg  2.5 mg 2.5 mg 1.25 mg 2.5 mg    Description       Take 1 and 1/2 tablets today, then restart 1/2 tablet on mondays and fridays and 1 tablet all other days.      2.  Start  alendronate (FOSAMAX) 70mg  1 tablet weekly 3.  recommend calcium 1200mg  daily through supplementation or diet.  4.  recommend weight bearing exercise - 30 minutes at least 4 days  per week.   5.  Counseled and educated about fall risk and prevention.  Recheck DEXA:  2 years Recheck INR/Protime - 1 month  Time spent counseling patient:  35 minutes

## 2013-03-11 NOTE — Progress Notes (Signed)
erroneous entry - could not enter vitamin D order.

## 2013-03-11 NOTE — Patient Instructions (Addendum)

## 2013-03-25 ENCOUNTER — Ambulatory Visit (INDEPENDENT_AMBULATORY_CARE_PROVIDER_SITE_OTHER): Payer: Medicare Other

## 2013-03-25 ENCOUNTER — Ambulatory Visit (INDEPENDENT_AMBULATORY_CARE_PROVIDER_SITE_OTHER): Payer: Medicare Other | Admitting: Family Medicine

## 2013-03-25 ENCOUNTER — Encounter: Payer: Self-pay | Admitting: Family Medicine

## 2013-03-25 VITALS — BP 131/75 | HR 65 | Temp 98.0°F | Ht 60.5 in | Wt 149.0 lb

## 2013-03-25 DIAGNOSIS — R05 Cough: Secondary | ICD-10-CM

## 2013-03-25 DIAGNOSIS — E559 Vitamin D deficiency, unspecified: Secondary | ICD-10-CM

## 2013-03-25 DIAGNOSIS — I10 Essential (primary) hypertension: Secondary | ICD-10-CM | POA: Diagnosis not present

## 2013-03-25 DIAGNOSIS — I4891 Unspecified atrial fibrillation: Secondary | ICD-10-CM

## 2013-03-25 DIAGNOSIS — R5383 Other fatigue: Secondary | ICD-10-CM | POA: Diagnosis not present

## 2013-03-25 DIAGNOSIS — Z79899 Other long term (current) drug therapy: Secondary | ICD-10-CM | POA: Diagnosis not present

## 2013-03-25 DIAGNOSIS — R5381 Other malaise: Secondary | ICD-10-CM | POA: Diagnosis not present

## 2013-03-25 DIAGNOSIS — E785 Hyperlipidemia, unspecified: Secondary | ICD-10-CM | POA: Diagnosis not present

## 2013-03-25 LAB — POCT CBC
HCT, POC: 40.6 % (ref 37.7–47.9)
MCH, POC: 23.7 pg — AB (ref 27–31.2)
MCHC: 30.9 g/dL — AB (ref 31.8–35.4)
MPV: 7.9 fL (ref 0–99.8)
POC Granulocyte: 5.5 (ref 2–6.9)
POC LYMPH PERCENT: 22.9 %L (ref 10–50)
RDW, POC: 18.8 %
WBC: 7.4 10*3/uL (ref 4.6–10.2)

## 2013-03-25 NOTE — Progress Notes (Signed)
Subjective:    Patient ID: Kari [name], female    DOB: [birth year], 79 y.o.   MRN: [id number]  HPI Patient returns to clinic today for followup and management of chronic medical problems. These include hypertension, atrial fibrillation, hyperlipidemia, and vitamin D deficiency. She is on Coumadin for her atrial fibrillation. On health maintenance issues she is in need of a DEXA, a repeat Pneumovax, and a shingles shot. She is also past due on her mammograms.   Review of Systems  Constitutional: Positive for fatigue (slight).  HENT: Negative for congestion and postnasal drip.   Eyes: Negative.   Respiratory: Positive for cough (dry).   Cardiovascular: Negative for chest pain and leg swelling.  Gastrointestinal: Negative.   Endocrine: Negative.   Genitourinary: Negative.   Musculoskeletal: Negative for back pain and arthralgias.  Allergic/Immunologic: Negative.   Neurological: Negative for dizziness, tremors, weakness and headaches.  Psychiatric/Behavioral: Positive for sleep disturbance (wakes to urinate).       Objective:   Physical Exam BP 131/75  Pulse 65  Temp(Src) 98 F (36.7 C) (Oral)  Ht 5' 0.5" (1.537 m)  Wt 149 lb (67.586 kg)  BMI 28.61 kg/m2  The patient appeared overweight and normally developed, alert and oriented to time and place. Speech, behavior and judgement appear normal. Vital signs as documented.  Head exam is unremarkable. No scleral icterus or pallor noted. Ears nose throat and mouth were within normal limits except for some nasal congestion bilaterally. Neck is without jugular venous distension, thyromegally, or carotid bruits. Carotid upstrokes are brisk bilaterally. No cervical adenopathy. Lungs are clear anteriorly and posteriorly to auscultation. Normal respiratory effort. There was no wheezing or rales the patient did have a dry cough. Cardiac exam reveals irregular irregular rate and rhythm at 84 per minute. First and second heart sounds normal. No  murmurs, rubs or gallops.  Breast exam was done today. There is no axillary adenopathy or masses felt in the breasts. Abdominal exam reveals obesity, normal bowl sounds, no masses, no organomegaly and no aortic enlargement. No inguinal adenopathy. There is no abdominal tenderness. Extremities are nonedematous and both  pedal pulses are normal. Skin without pallor or jaundice.  Warm and dry. There was a profuse fungal type rash beneath both breasts. Neurologic exam reveals normal deep tendon reflexes and normal sensation.   [facility] reading (PRIMARY) by  Dr. Christell Constant: Chest x-ray; borderline cardiac enlargement and degenerative changes in the spine                                         Assessment & Plan:  1. Atrial fibrillation -Continue followup with cardiology  2. Vitamin D deficiency - Vitamin D 25 hydroxy; Standing - Vitamin D 25 hydroxy  3. Hyperlipemia - NMR Lipoprofile with Lipids; Standing - Hepatic function panel; Standing - NMR Lipoprofile with Lipids - Hepatic function panel  4. Hypertension - BASIC METABOLIC PANEL WITH GFR; Standing - BASIC METABOLIC PANEL WITH GFR  5. High risk medication use - Hepatic function panel; Standing - Hepatic function panel  6. Fatigue - Thyroid Panel With TSH - POCT CBC; Standing - POCT CBC  7. Cough - DG Chest 2 View; Future  Patient Instructions  Fall precautions discussed Continue current meds and therapeutic lifestyle changes Pt will schedule mammogram Use Monistat cream or Lamisil cream for rash beneath breasts, apply lightly twice daily Take Mucinex maximum strength, blue and  white pill, over-the-counter one twice daily with a large glass of water Drink plenty of fluids We will give Pneumovax with flu shot in the fall We will check on the cost of the shingles shot and determine if this will be given or not    Nyra Capes MD

## 2013-03-25 NOTE — Patient Instructions (Addendum)
Fall precautions discussed Continue current meds and therapeutic lifestyle changes Pt will schedule mammogram Use Monistat cream or Lamisil cream for rash beneath breasts, apply lightly twice daily Take Mucinex maximum strength, blue and white pill, over-the-counter one twice daily with a large glass of water Drink plenty of fluids We will give Pneumovax with flu shot in the fall We will check on the cost of the shingles shot and determine if this will be given or not

## 2013-03-26 LAB — BASIC METABOLIC PANEL WITH GFR
Calcium: 9.4 mg/dL (ref 8.4–10.5)
Creat: 0.71 mg/dL (ref 0.50–1.10)
GFR, Est African American: >89 mL/min
Glucose, Bld: 119 mg/dL — ABNORMAL HIGH (ref 70–99)
Sodium: 139 mEq/L (ref 135–145)

## 2013-03-26 LAB — NMR LIPOPROFILE WITH LIPIDS
HDL Particle Number: 27.9 umol/L — ABNORMAL LOW (ref >=30.5)
LDL (calc): 61 mg/dL (ref <100)
LDL Size: 20.5 nm — ABNORMAL LOW (ref >20.5)
LP-IR Score: 49 — ABNORMAL HIGH (ref <=45)
Large VLDL-P: 2.3 nmol/L (ref <=2.7)
Small LDL Particle Number: 495 nmol/L (ref <=527)

## 2013-03-26 LAB — HEPATIC FUNCTION PANEL
ALT: 12 U/L (ref 0–35)
AST: 22 U/L (ref 0–37)
Alkaline Phosphatase: 79 U/L (ref 39–117)
Bilirubin, Direct: 0.2 mg/dL (ref 0.0–0.3)
Indirect Bilirubin: 0.5 mg/dL (ref 0.0–0.9)
Total Protein: 7.2 g/dL (ref 6.0–8.3)

## 2013-03-26 LAB — THYROID PANEL WITH TSH: TSH: 2.939 u[IU]/mL (ref 0.350–4.500)

## 2013-04-07 ENCOUNTER — Telehealth: Payer: Self-pay | Admitting: *Deleted

## 2013-04-07 MED ORDER — VITAMIN D (ERGOCALCIFEROL) 1.25 MG (50000 UNIT) PO CAPS: 50000.0000 [IU] | ORAL_CAPSULE | ORAL | Status: DC

## 2013-04-07 NOTE — Telephone Encounter (Signed)
Message copied by Bearl Mulberry on Tue Apr 07, 2013  6:03 PM ------      Message from: Ernestina Penna      Created: Wed Mar 25, 2013  1:33 PM       The CBC has a normal white blood cell count. Hemoglobin was good at 12.5 and the platelet count was adequate. ------

## 2013-04-15 ENCOUNTER — Encounter: Payer: Self-pay | Admitting: Cardiology

## 2013-04-15 ENCOUNTER — Ambulatory Visit (INDEPENDENT_AMBULATORY_CARE_PROVIDER_SITE_OTHER): Payer: Medicare Other | Admitting: Cardiology

## 2013-04-15 VITALS — BP 130/63 | HR 87 | Ht 60.5 in | Wt 151.0 lb

## 2013-04-15 DIAGNOSIS — I10 Essential (primary) hypertension: Secondary | ICD-10-CM | POA: Diagnosis not present

## 2013-04-15 DIAGNOSIS — I4891 Unspecified atrial fibrillation: Secondary | ICD-10-CM

## 2013-04-15 NOTE — Patient Instructions (Addendum)
Your physician wants you to follow-up in: 18 MONTH WITH DR. HOCHREIN. You will receive a reminder letter in the mail two months in advance. If you don't receive a letter, please call our office to schedule the follow-up appointment.  NO CHANGES WERE MADE TODAY

## 2013-04-15 NOTE — Progress Notes (Signed)
HPI The patient presents for followup of atrial fibrillation. She continues to do well. She denies any chest pressure, neck or arm discomfort. She does not notice any dysrhythmias and has no presyncope or syncope. She denies any chest pressure, neck or arm discomfort. She denies any shortness of breath, PND or orthopnea. She's had no weight gain or edema. She tolerates her warfarin. She remains very active.  Allergies  Allergen Reactions  . Erythromycin Other (See Comments)    Reaction unknown  . Penicillins Other (See Comments)    Reaction unknown    Current Outpatient Prescriptions  Medication Sig Dispense Refill  . alendronate (FOSAMAX) 70 MG tablet Take 1 tablet (70 mg total) by mouth every 7 (seven) days. Take with a full glass of water on an empty stomach.  12 tablet  prn  . amLODipine-valsartan (EXFORGE) 5-320 MG per tablet Take 0.5 tablets by mouth every morning.       Marland Kitchen atenolol (TENORMIN) 50 MG tablet Take 50 mg by mouth 2 (two) times daily.       . captopril (CAPOTEN) 50 MG tablet Take 50 mg by mouth 2 (two) times daily.       . Cholecalciferol (VITAMIN D3) 1000 UNITS CAPS Take 1 capsule by mouth every morning.       . ezetimibe (ZETIA) 10 MG tablet Take 10 mg by mouth every evening.       . hydrochlorothiazide (MICROZIDE) 12.5 MG capsule Take 12.5 mg by mouth every morning.      . pravastatin (PRAVACHOL) 80 MG tablet Take 80 mg by mouth every evening.      . Vitamin D, Ergocalciferol, (DRISDOL) 50000 UNITS CAPS capsule Take 1 capsule (50,000 Units total) by mouth every 7 (seven) days.  12 capsule  2  . warfarin (COUMADIN) 2.5 MG tablet Take 1.25-2.5 mg by mouth every evening. Take 0.5 tablet on Friday and Sunday. Take 1 tablet all other days of the week       No current facility-administered medications for this visit.    Past Medical History  Diagnosis Date  . Obesity   . Symptomatic menopausal or female climacteric states   . Kyphosis   . Gallstones   . Adenomatous  polyps   . AF (atrial fibrillation)   . Hyperlipidemia     x5 years  . Hypertension     x5 years  . Hx of adenomatous colonic polyps     Past Surgical History  Procedure Laterality Date  . Vaginal hysterectomy    . Bladder suspension    . Colonoscopy  multiple    ROS:  As stated in the HPI and negative for all other systems.  PHYSICAL EXAM BP 130/63  Pulse 87  Ht 5' 0.5" (1.537 m)  Wt 151 lb (68.493 kg)  BMI 28.99 kg/m2 GENERAL:  Well appearing NECK:  No jugular venous distention, waveform within normal limits, carotid upstroke brisk and symmetric, no bruits, no thyromegaly LYMPHATICS:  No cervical, inguinal adenopathy LUNGS:  Clear to auscultation bilaterally CHEST:  Unremarkable HEART:  PMI not displaced or sustained,S1 and S2 within normal limits, no S3 no clicks, no rubs, no murmurs, irregular ABD:  Flat, positive bowel sounds normal in frequency in pitch, no bruits, no rebound, no guarding, no midline pulsatile mass, no hepatomegaly, no splenomegaly EXT:  2 plus pulses throughout, no edema, no cyanosis no clubbing  EKG:  Atrial fibrillation, rate 87, axis within normal limits, nonspecific diffuse ST-T wave changes.  04/15/2013  ASSESSMENT  AND PLAN  ATRIAL FIBRILLATION: The patient  tolerates this rhythm and rate control and anticoagulation. We will continue with the meds as listed.  We did discuss NOACs but he has no desire to switch medications.  HTN:  The blood pressure is at target. No change in medications is indicated. We will continue with therapeutic lifestyle changes (TLC).

## 2013-04-29 ENCOUNTER — Ambulatory Visit (INDEPENDENT_AMBULATORY_CARE_PROVIDER_SITE_OTHER): Payer: Medicare Other | Admitting: Pharmacist

## 2013-04-29 DIAGNOSIS — I4891 Unspecified atrial fibrillation: Secondary | ICD-10-CM

## 2013-04-29 NOTE — Patient Instructions (Addendum)
Anticoagulation Dose Instructions as of 04/29/2013     Glynis Smiles Tue Wed Thu Fri Sat   New Dose 2.5 mg 1.25 mg 2.5 mg 2.5 mg 2.5 mg 2.5 mg 2.5 mg    Description       Start 1/2 tablet on mondays only and and 1 tablet all other days.       INR was 1.8 today

## 2013-05-18 DIAGNOSIS — H2589 Other age-related cataract: Secondary | ICD-10-CM | POA: Diagnosis not present

## 2013-05-28 ENCOUNTER — Ambulatory Visit (INDEPENDENT_AMBULATORY_CARE_PROVIDER_SITE_OTHER): Payer: Medicare Other | Admitting: Pharmacist

## 2013-05-28 DIAGNOSIS — I4891 Unspecified atrial fibrillation: Secondary | ICD-10-CM | POA: Diagnosis not present

## 2013-05-28 DIAGNOSIS — Z23 Encounter for immunization: Secondary | ICD-10-CM | POA: Diagnosis not present

## 2013-05-28 LAB — POCT INR: INR: 2.9

## 2013-05-28 NOTE — Patient Instructions (Signed)
Anticoagulation Dose Instructions as of 05/28/2013     Glynis Smiles Tue Wed Thu Fri Sat   New Dose 2.5 mg 1.25 mg 2.5 mg 2.5 mg 2.5 mg 2.5 mg 2.5 mg    Description       Start 1/2 tablet on mondays only and and 1 tablet all other days.      INR was 2.9 today

## 2013-06-30 ENCOUNTER — Other Ambulatory Visit: Payer: Self-pay | Admitting: Family Medicine

## 2013-06-30 ENCOUNTER — Ambulatory Visit (INDEPENDENT_AMBULATORY_CARE_PROVIDER_SITE_OTHER): Payer: Medicare Other | Admitting: Pharmacist Clinician (PhC)/ Clinical Pharmacy Specialist

## 2013-06-30 DIAGNOSIS — I4891 Unspecified atrial fibrillation: Secondary | ICD-10-CM | POA: Diagnosis not present

## 2013-06-30 LAB — POCT INR: INR: 2.8

## 2013-07-07 ENCOUNTER — Other Ambulatory Visit: Payer: Self-pay | Admitting: Family Medicine

## 2013-07-15 ENCOUNTER — Other Ambulatory Visit: Payer: Self-pay | Admitting: Family Medicine

## 2013-07-20 ENCOUNTER — Other Ambulatory Visit: Payer: Self-pay | Admitting: Family Medicine

## 2013-08-11 ENCOUNTER — Ambulatory Visit (INDEPENDENT_AMBULATORY_CARE_PROVIDER_SITE_OTHER): Payer: Medicare Other | Admitting: Pharmacist Clinician (PhC)/ Clinical Pharmacy Specialist

## 2013-08-11 DIAGNOSIS — I4891 Unspecified atrial fibrillation: Secondary | ICD-10-CM

## 2013-08-11 LAB — POCT INR: INR: 3

## 2013-09-10 ENCOUNTER — Ambulatory Visit (INDEPENDENT_AMBULATORY_CARE_PROVIDER_SITE_OTHER): Payer: Medicare Other | Admitting: Family Medicine

## 2013-09-10 ENCOUNTER — Ambulatory Visit (INDEPENDENT_AMBULATORY_CARE_PROVIDER_SITE_OTHER): Payer: Medicare Other | Admitting: Pharmacist

## 2013-09-10 ENCOUNTER — Encounter: Payer: Self-pay | Admitting: Family Medicine

## 2013-09-10 VITALS — BP 141/106 | HR 77 | Temp 96.0°F | Ht 60.5 in | Wt 151.0 lb

## 2013-09-10 DIAGNOSIS — E785 Hyperlipidemia, unspecified: Secondary | ICD-10-CM | POA: Diagnosis not present

## 2013-09-10 DIAGNOSIS — I4891 Unspecified atrial fibrillation: Secondary | ICD-10-CM

## 2013-09-10 DIAGNOSIS — I10 Essential (primary) hypertension: Secondary | ICD-10-CM | POA: Diagnosis not present

## 2013-09-10 DIAGNOSIS — E559 Vitamin D deficiency, unspecified: Secondary | ICD-10-CM | POA: Diagnosis not present

## 2013-09-10 LAB — POCT CBC
GRANULOCYTE PERCENT: 68.8 % (ref 37–80)
HCT, POC: 45.3 % (ref 37.7–47.9)
Hemoglobin: 13.8 g/dL (ref 12.2–16.2)
LYMPH, POC: 1.8 (ref 0.6–3.4)
MCH, POC: 24.9 pg — AB (ref 27–31.2)
MCHC: 30.6 g/dL — AB (ref 31.8–35.4)
MCV: 81.4 fL (ref 80–97)
MPV: 8.1 fL (ref 0–99.8)
PLATELET COUNT, POC: 256 10*3/uL (ref 142–424)
POC GRANULOCYTE: 5.1 (ref 2–6.9)
POC LYMPH %: 23.8 % (ref 10–50)
RBC: 5.6 M/uL — AB (ref 4.04–5.48)
RDW, POC: 18 %
WBC: 7.4 10*3/uL (ref 4.6–10.2)

## 2013-09-10 LAB — POCT INR: INR: 3.1

## 2013-09-10 NOTE — Patient Instructions (Signed)
Anticoagulation Dose Instructions as of 09/10/2013     Kari [name] Tue Wed Thu Fri Sat   New Dose 2.5 mg 1.25 mg 2.5 mg 2.5 mg 2.5 mg 2.5 mg 2.5 mg    Description       Hold for 1 days.  Continue 1/2 tablet on mondays only and and 1 tablet all other days.      INR was 3.1 today

## 2013-09-10 NOTE — Patient Instructions (Addendum)
Continue current medications. Continue good therapeutic lifestyle changes which include good diet and exercise. Fall precautions discussed with patient. Schedule your flu vaccine if you haven't had it yet If you are over 78 years old - you may need Prevnar 64 or the adult Pneumonia vaccine. Do not forget to get your pelvic exam, mammogram, and bring back your FOBT. We will call you with the results of the lab work once it is available. The Prevnar vaccination may make your arm sore. Always bring outside blood pressure readings to your office visit.

## 2013-09-10 NOTE — Progress Notes (Signed)
Subjective:    Patient ID: Kari [name], female    DOB: 07-13-1934, 80 y.o.   MRN: [id number]  HPI Pt here for follow up and management of chronic medical problems. The patient is alert and in good spirits. She had just taken her blood pressure medicine before she came to the office today. A repeat blood pressure was 156/76 in the right arm. Her home blood pressure readings run in the 130s over the 60s. She does need to schedule a pelvic exam with a provider. She does with her daughter to get her mammogram in [place]. She was given FOBT today and has had lab work done today. She'll also be given Prevnar today. Eye exam was in 2014.       Patient Active Problem List   Diagnosis Date Noted  . Osteoporosis 03/11/2013  . A-fib 11/24/2012  . Hypokalemia 05/04/2012  . Atypical chest pain 05/04/2012  . Obesity   . Symptomatic menopausal or female climacteric states   . Kyphosis   . Gallstones   . Personal history of adenomatous colonic polyps   . AF (atrial fibrillation)   . HYPERLIPIDEMIA 03/29/2009  . HYPERTENSION 03/29/2009   Outpatient Encounter Prescriptions as of 09/10/2013  Medication Sig  . alendronate (FOSAMAX) 70 MG tablet Take 1 tablet (70 mg total) by mouth every 7 (seven) days. Take with a full glass of water on an empty stomach.  Marland Kitchen atenolol (TENORMIN) 50 MG tablet TAKE  (1)  TABLET TWICE A DAY.  . captopril (CAPOTEN) 50 MG tablet TAKE  (1)  TABLET TWICE A DAY FOR HIGH BLOOD PRESSURE.  Marland Kitchen EXFORGE 5-320 MG per tablet TAKE 1 TABLET DAILY  . hydrochlorothiazide (MICROZIDE) 12.5 MG capsule Take 12.5 mg by mouth every morning.  . pravastatin (PRAVACHOL) 80 MG tablet TAKE 1 TABLET ONCE DAILY FOR CHOLESTEROL  . Vitamin D, Ergocalciferol, (DRISDOL) 50000 UNITS CAPS capsule Take 1 capsule (50,000 Units total) by mouth every 7 (seven) days.  Marland Kitchen warfarin (COUMADIN) 2.5 MG tablet TAKE 1 TABLET ONCE DAILY OR AS DIRECTED  . ZETIA 10 MG tablet TAKE 1 TABLET ONCE DAILY FOR CHOLESTEROL    . Cholecalciferol (VITAMIN D3) 1000 UNITS CAPS Take 1 capsule by mouth every morning.     Review of Systems  Constitutional: Negative.   HENT: Negative.   Eyes: Negative.   Respiratory: Negative.   Cardiovascular: Negative.   Gastrointestinal: Negative.   Endocrine: Negative.   Genitourinary: Negative.   Musculoskeletal: Negative.   Skin: Negative.   Allergic/Immunologic: Negative.   Neurological: Negative.   Hematological: Negative.   Psychiatric/Behavioral: Negative.        Objective:   Physical Exam  Nursing note and vitals reviewed. Constitutional: She is oriented to person, place, and time. She appears well-developed and well-nourished. No distress.  Very pleasant and positive demeanor  HENT:  Head: Normocephalic and atraumatic.  Right Ear: External ear normal.  Left Ear: External ear normal.  Nose: Nose normal.  Mouth/Throat: Oropharynx is clear and moist.  Upper and lower dentures in place  Eyes: Conjunctivae and EOM are normal. Pupils are equal, round, and reactive to light. Right eye exhibits no discharge. Left eye exhibits no discharge. No scleral icterus.  Neck: Normal range of motion. Neck supple. No thyromegaly present.  Cardiovascular: Normal rate, normal heart sounds and intact distal pulses.  Exam reveals no gallop and no friction rub.   No murmur heard. Irregular irregular at 72 per minute  Pulmonary/Chest: Effort normal and breath sounds normal.  No respiratory distress. She has no wheezes. She has no rales. She exhibits no tenderness.  Abdominal: Soft. Bowel sounds are normal. She exhibits no mass. There is no tenderness. There is no rebound and no guarding.  Musculoskeletal: Normal range of motion. She exhibits no edema and no tenderness.  Lymphadenopathy:    She has no cervical adenopathy.  Neurological: She is alert and oriented to person, place, and time. She has normal reflexes. No cranial nerve deficit.  Skin: Skin is warm and dry.  Psychiatric:  She has a normal mood and affect. Her behavior is normal. Judgment and thought content normal.   BP 141/106  Pulse 77  Temp(Src) 96 F (35.6 C) (Oral)  Ht 5' 0.5" (1.537 m)  Wt 151 lb (68.493 kg)  BMI 28.99 kg/m2        Assessment & Plan:  1. A-fib - POCT CBC  2. HYPERLIPIDEMIA - POCT CBC - NMR, lipoprofile  3. HYPERTENSION - POCT CBC - BMP8+EGFR - Hepatic function panel  4. Vitamin D deficiency - Vit D  25 hydroxy (rtn osteoporosis monitoring)   Patient Instructions  Continue current medications. Continue good therapeutic lifestyle changes which include good diet and exercise. Fall precautions discussed with patient. Schedule your flu vaccine if you haven't had it yet If you are over 61 years old - you may need Prevnar 59 or the adult Pneumonia vaccine. Do not forget to get your pelvic exam, mammogram, and bring back your FOBT. We will call you with the results of the lab work once it is available. The Prevnar vaccination may make your arm sore. Always bring outside blood pressure readings to your office visit.    Arrie Senate MD

## 2013-09-13 LAB — NMR, LIPOPROFILE
CHOLESTEROL: 148 mg/dL (ref <200)
HDL CHOLESTEROL BY NMR: 50 mg/dL (ref >=40)
HDL PARTICLE NUMBER: 32.9 umol/L (ref >=30.5)
LDL PARTICLE NUMBER: 1135 nmol/L — AB (ref <1000)
LDL Size: 21.3 nm (ref >20.5)
LDLC SERPL CALC-MCNC: 74 mg/dL (ref <100)
LP-IR SCORE: 56 — AB (ref <=45)
Small LDL Particle Number: 459 nmol/L (ref <=527)
Triglycerides by NMR: 118 mg/dL (ref <150)

## 2013-09-13 LAB — VITAMIN D 25 HYDROXY (VIT D DEFICIENCY, FRACTURES): VIT D 25 HYDROXY: 27.2 ng/mL — AB (ref 30.0–100.0)

## 2013-09-13 LAB — BMP8+EGFR
BUN/Creatinine Ratio: 10 — ABNORMAL LOW (ref 11–26)
BUN: 6 mg/dL — AB (ref 8–27)
CHLORIDE: 101 mmol/L (ref 97–108)
CO2: 28 mmol/L (ref 18–29)
Calcium: 9.6 mg/dL (ref 8.6–10.2)
Creatinine, Ser: 0.6 mg/dL (ref 0.57–1.00)
GFR calc non Af Amer: 88 mL/min/{1.73_m2} (ref >59)
GFR, EST AFRICAN AMERICAN: 102 mL/min/{1.73_m2} (ref >59)
GLUCOSE: 103 mg/dL — AB (ref 65–99)
POTASSIUM: 4.4 mmol/L (ref 3.5–5.2)
Sodium: 143 mmol/L (ref 134–144)

## 2013-09-13 LAB — HEPATIC FUNCTION PANEL
ALBUMIN: 4.3 g/dL (ref 3.5–4.8)
ALK PHOS: 93 IU/L (ref 39–117)
ALT: 10 IU/L (ref 0–32)
AST: 24 IU/L (ref 0–40)
Bilirubin, Direct: 0.14 mg/dL (ref 0.00–0.40)
TOTAL PROTEIN: 7.7 g/dL (ref 6.0–8.5)
Total Bilirubin: 0.4 mg/dL (ref 0.0–1.2)

## 2013-09-15 DIAGNOSIS — B351 Tinea unguium: Secondary | ICD-10-CM | POA: Diagnosis not present

## 2013-09-15 DIAGNOSIS — M79609 Pain in unspecified limb: Secondary | ICD-10-CM | POA: Diagnosis not present

## 2013-09-18 ENCOUNTER — Encounter: Payer: Self-pay | Admitting: *Deleted

## 2013-09-18 NOTE — Progress Notes (Signed)
This encounter was created in error - please disregard.

## 2013-09-24 ENCOUNTER — Other Ambulatory Visit: Payer: Self-pay | Admitting: *Deleted

## 2013-09-24 MED ORDER — AMLODIPINE BESYLATE 5 MG PO TABS: 5.0000 mg | ORAL_TABLET | Freq: Every day | ORAL | Status: DC

## 2013-09-24 MED ORDER — LOSARTAN POTASSIUM 100 MG PO TABS: 100.0000 mg | ORAL_TABLET | Freq: Every day | ORAL | Status: DC

## 2013-10-03 DIAGNOSIS — S6990XA Unspecified injury of unspecified wrist, hand and finger(s), initial encounter: Secondary | ICD-10-CM | POA: Diagnosis not present

## 2013-10-03 DIAGNOSIS — S59909A Unspecified injury of unspecified elbow, initial encounter: Secondary | ICD-10-CM | POA: Diagnosis not present

## 2013-10-03 DIAGNOSIS — M79609 Pain in unspecified limb: Secondary | ICD-10-CM | POA: Diagnosis not present

## 2013-10-03 DIAGNOSIS — S59919A Unspecified injury of unspecified forearm, initial encounter: Secondary | ICD-10-CM | POA: Diagnosis not present

## 2013-10-05 ENCOUNTER — Other Ambulatory Visit: Payer: Self-pay | Admitting: Family Medicine

## 2013-10-14 ENCOUNTER — Other Ambulatory Visit: Payer: Self-pay | Admitting: Family Medicine

## 2013-10-15 ENCOUNTER — Encounter: Payer: Self-pay | Admitting: Nurse Practitioner

## 2013-10-15 ENCOUNTER — Ambulatory Visit (INDEPENDENT_AMBULATORY_CARE_PROVIDER_SITE_OTHER): Payer: Medicare Other | Admitting: Nurse Practitioner

## 2013-10-15 ENCOUNTER — Ambulatory Visit (INDEPENDENT_AMBULATORY_CARE_PROVIDER_SITE_OTHER): Payer: Medicare Other | Admitting: Pharmacist

## 2013-10-15 VITALS — BP 148/88 | HR 73 | Temp 97.1°F | Ht 60.5 in | Wt 150.0 lb

## 2013-10-15 DIAGNOSIS — S60929A Unspecified superficial injury of unspecified hand, initial encounter: Secondary | ICD-10-CM | POA: Diagnosis not present

## 2013-10-15 DIAGNOSIS — Z1212 Encounter for screening for malignant neoplasm of rectum: Secondary | ICD-10-CM | POA: Diagnosis not present

## 2013-10-15 DIAGNOSIS — I4891 Unspecified atrial fibrillation: Secondary | ICD-10-CM | POA: Diagnosis not present

## 2013-10-15 DIAGNOSIS — S61419A Laceration without foreign body of unspecified hand, initial encounter: Secondary | ICD-10-CM

## 2013-10-15 LAB — POCT INR: INR: 3.2

## 2013-10-15 NOTE — Progress Notes (Signed)
Patient triage to Chevis Pretty, NP to evaluate injury to right hand.

## 2013-10-15 NOTE — Patient Instructions (Signed)
Anticoagulation Dose Instructions as of 10/15/2013     Kari [name] Tue Wed Thu Fri Sat   New Dose 2.5 mg 1.25 mg 2.5 mg 2.5 mg 2.5 mg 1.25 mg 2.5 mg    Description       Hold for 1 day.  Continue 1/2 tablet on mondays and fridays and and 1 tablet all other days.      INR was 3.2 today

## 2013-10-15 NOTE — Patient Instructions (Signed)
Wound Care Wound care helps prevent pain and infection.  You may need a tetanus shot if:  You cannot remember when you had your last tetanus shot.  You have never had a tetanus shot.  The injury broke your skin. If you need a tetanus shot and you choose not to have one, you may get tetanus. Sickness from tetanus can be serious. HOME CARE   Only take medicine as told by your doctor.  Clean the wound daily with mild soap and water.  Change any bandages (dressings) as told by your doctor.  Put medicated cream and a bandage on the wound as told by your doctor.  Change the bandage if it gets wet, dirty, or starts to smell.  Take showers. Do not take baths, swim, or do anything that puts your wound under water.  Rest and raise (elevate) the wound until the pain and puffiness (swelling) are better.  Keep all doctor visits as told. GET HELP RIGHT AWAY IF:   Yellowish-white fluid (pus) comes from the wound.  Medicine does not lessen your pain.  There is a red streak going away from the wound.  You have a fever. MAKE SURE YOU:   Understand these instructions.  Will watch your condition.  Will get help right away if you are not doing well or get worse. Document Released: 05/29/2008 Document Revised: 11/12/2011 Document Reviewed: 12/24/2010 [employer] Patient Information 2014 [employer].  

## 2013-10-15 NOTE — Progress Notes (Signed)
   Subjective:    Patient ID: Kari [name], female    DOB: [birth year], 80 y.o.   MRN: [id number]  HPI Patient was helping her husband clean out an old house and she hit her hand on a piece of paneling an tore the skin.    Review of Systems  Constitutional: Negative.   HENT: Negative.   Respiratory: Negative.   Cardiovascular: Negative.   All other systems reviewed and are negative.       Objective:   Physical Exam  Constitutional: She appears well-developed and well-nourished.  Cardiovascular: Normal rate, regular rhythm and normal heart sounds.   Pulmonary/Chest: Effort normal and breath sounds normal.  Skin:  Skin tear measuring 5cm X 8cm dorsal surface of right hand.   Wound debridement on dorsal surface right hand Antibiotic ointment and tegaderm       Assessment & Plan:   1. Screening for malignant neoplasm of the rectum   2. Superficial laceration of hand    Wound care discussed No td needed  [place], FNP

## 2013-10-16 LAB — FECAL OCCULT BLOOD, IMMUNOCHEMICAL: FECAL OCCULT BLD: POSITIVE — AB

## 2013-10-21 DIAGNOSIS — E876 Hypokalemia: Secondary | ICD-10-CM | POA: Diagnosis not present

## 2013-10-21 DIAGNOSIS — K8020 Calculus of gallbladder without cholecystitis without obstruction: Secondary | ICD-10-CM | POA: Diagnosis not present

## 2013-10-21 DIAGNOSIS — K5289 Other specified noninfective gastroenteritis and colitis: Secondary | ICD-10-CM | POA: Diagnosis not present

## 2013-10-21 DIAGNOSIS — Z7901 Long term (current) use of anticoagulants: Secondary | ICD-10-CM | POA: Diagnosis not present

## 2013-10-21 DIAGNOSIS — I4891 Unspecified atrial fibrillation: Secondary | ICD-10-CM | POA: Diagnosis not present

## 2013-10-21 DIAGNOSIS — I10 Essential (primary) hypertension: Secondary | ICD-10-CM | POA: Diagnosis not present

## 2013-10-21 DIAGNOSIS — R6889 Other general symptoms and signs: Secondary | ICD-10-CM | POA: Diagnosis not present

## 2013-10-21 DIAGNOSIS — Z79899 Other long term (current) drug therapy: Secondary | ICD-10-CM | POA: Diagnosis not present

## 2013-11-02 ENCOUNTER — Other Ambulatory Visit: Payer: Self-pay | Admitting: Family Medicine

## 2013-11-10 ENCOUNTER — Other Ambulatory Visit: Payer: No Typology Code available for payment source | Admitting: General Practice

## 2013-11-10 ENCOUNTER — Ambulatory Visit (INDEPENDENT_AMBULATORY_CARE_PROVIDER_SITE_OTHER): Payer: Medicare Other | Admitting: Pharmacist Clinician (PhC)/ Clinical Pharmacy Specialist

## 2013-11-10 ENCOUNTER — Encounter (INDEPENDENT_AMBULATORY_CARE_PROVIDER_SITE_OTHER): Payer: Self-pay

## 2013-11-10 DIAGNOSIS — I4891 Unspecified atrial fibrillation: Secondary | ICD-10-CM

## 2013-11-10 LAB — POCT INR: INR: 1.1

## 2013-11-19 ENCOUNTER — Ambulatory Visit (INDEPENDENT_AMBULATORY_CARE_PROVIDER_SITE_OTHER): Payer: Medicare Other | Admitting: Pharmacist

## 2013-11-19 DIAGNOSIS — I4891 Unspecified atrial fibrillation: Secondary | ICD-10-CM | POA: Diagnosis not present

## 2013-11-19 LAB — POCT INR: INR: 3.9

## 2013-11-19 NOTE — Patient Instructions (Signed)
Anticoagulation Dose Instructions as of 11/19/2013     Kari [name] Tue Wed Thu Fri Sat   New Dose 2.5 mg 1.25 mg 2.5 mg 1.25 mg 2.5 mg 1.25 mg 2.5 mg    Description       No warfarin tomorrow (friday, march 20th), then decrease dose to 1/2 tablet mondays, wednesdays and fridays and 1 tablet all other days.     INR was 3.9 today

## 2013-11-26 ENCOUNTER — Ambulatory Visit (INDEPENDENT_AMBULATORY_CARE_PROVIDER_SITE_OTHER): Payer: Medicare Other | Admitting: Pharmacist

## 2013-11-26 DIAGNOSIS — M810 Age-related osteoporosis without current pathological fracture: Secondary | ICD-10-CM

## 2013-11-26 DIAGNOSIS — I4891 Unspecified atrial fibrillation: Secondary | ICD-10-CM

## 2013-11-26 LAB — POCT INR: INR: 3.5

## 2013-11-26 NOTE — Patient Instructions (Signed)
Anticoagulation Dose Instructions as of 11/26/2013     Kari [name] Tue Wed Thu Fri Sat   New Dose 2.5 mg 1.25 mg 1.25 mg 1.25 mg 2.5 mg 1.25 mg 2.5 mg    Description       No warfarin tomorrow (friday, march 27th), then decrease dose to 1/2 tablet mondays, tuesdays, wednesdays and fridays and 1 tablet all other days.      INR was 3.5 today

## 2013-12-08 ENCOUNTER — Ambulatory Visit (INDEPENDENT_AMBULATORY_CARE_PROVIDER_SITE_OTHER): Payer: Medicare Other | Admitting: Pharmacist Clinician (PhC)/ Clinical Pharmacy Specialist

## 2013-12-08 DIAGNOSIS — I4891 Unspecified atrial fibrillation: Secondary | ICD-10-CM

## 2013-12-08 LAB — POCT INR: INR: 2.1

## 2013-12-08 NOTE — Progress Notes (Signed)
Patient has edema in left ankle since starting Exforge.  She was instructed to elevated feet while sitting and in bed.  She believes the swelling is down by morning.  I let her know I will notify Dr. Laurance Flatten to decide what to do.  Elderly women are more prone to peripheral edema with amlodipine when exceeding 2.5mg  strength.

## 2013-12-14 ENCOUNTER — Telehealth: Payer: Self-pay | Admitting: Family Medicine

## 2013-12-14 NOTE — Telephone Encounter (Signed)
dwm - please note from 12/08/13 visit with Sharyn Lull. To address this  Swelling since started exforge

## 2013-12-14 NOTE — Telephone Encounter (Signed)
The swelling and edema are most likely coming from the medication------ did not take any more of this until the clinical pharmacist calls you tomorrow

## 2013-12-15 MED ORDER — AMLODIPINE BESYLATE-VALSARTAN 5-320 MG PO TABS: 0.5000 | ORAL_TABLET | Freq: Every day | ORAL | Status: DC

## 2013-12-15 NOTE — Telephone Encounter (Signed)
Called Kari [name] regarding swelling in her ankles that has not resolved.  Her home BP readings this past week are as follows:  140/60.  Patient advised to cut exforge in half to decrease the amlodipine dose to 2.5mg  which will hopefully help the swelling resolve.  Elderly females are more prone to peripheral edema with amldipine when exceeding 2.5mg  strength.  She will call back with update on swelling and BP readings in a couple of days.

## 2013-12-30 ENCOUNTER — Ambulatory Visit (INDEPENDENT_AMBULATORY_CARE_PROVIDER_SITE_OTHER): Payer: Medicare Other | Admitting: Pharmacist

## 2013-12-30 DIAGNOSIS — I4891 Unspecified atrial fibrillation: Secondary | ICD-10-CM | POA: Diagnosis not present

## 2013-12-30 LAB — POCT INR: INR: 1.6

## 2013-12-30 NOTE — Patient Instructions (Signed)
Anticoagulation Dose Instructions as of 12/30/2013     Kari [name] Tue Wed Thu Fri Sat   New Dose 2.5 mg 1.25 mg 2.5 mg 1.25 mg 2.5 mg 1.25 mg 2.5 mg    Description       Take 1 tablet today (Wednesday 12/30/13), then increase dose to 1 tablet on sundays, tuesdays, thursdays and saturdays.  1/2 tablet on Mondays, Wednesdays and Fridays.      INR was 1.6 today

## 2014-01-06 ENCOUNTER — Ambulatory Visit (INDEPENDENT_AMBULATORY_CARE_PROVIDER_SITE_OTHER): Payer: Medicare Other | Admitting: Family Medicine

## 2014-01-06 ENCOUNTER — Encounter: Payer: Self-pay | Admitting: Family Medicine

## 2014-01-06 VITALS — BP 154/88 | HR 78 | Temp 97.8°F | Ht 60.5 in | Wt 154.0 lb

## 2014-01-06 DIAGNOSIS — E785 Hyperlipidemia, unspecified: Secondary | ICD-10-CM

## 2014-01-06 DIAGNOSIS — I10 Essential (primary) hypertension: Secondary | ICD-10-CM | POA: Diagnosis not present

## 2014-01-06 DIAGNOSIS — Z23 Encounter for immunization: Secondary | ICD-10-CM

## 2014-01-06 DIAGNOSIS — E559 Vitamin D deficiency, unspecified: Secondary | ICD-10-CM | POA: Diagnosis not present

## 2014-01-06 DIAGNOSIS — E8881 Metabolic syndrome: Secondary | ICD-10-CM

## 2014-01-06 DIAGNOSIS — I4891 Unspecified atrial fibrillation: Secondary | ICD-10-CM | POA: Diagnosis not present

## 2014-01-06 LAB — POCT CBC
GRANULOCYTE PERCENT: 75.5 % (ref 37–80)
HEMATOCRIT: 39 % (ref 37.7–47.9)
Hemoglobin: 12.2 g/dL (ref 12.2–16.2)
Lymph, poc: 1.5 (ref 0.6–3.4)
MCH, POC: 25.7 pg — AB (ref 27–31.2)
MCHC: 31.3 g/dL — AB (ref 31.8–35.4)
MCV: 82.1 fL (ref 80–97)
MPV: 8.3 fL (ref 0–99.8)
POC Granulocyte: 5.3 (ref 2–6.9)
POC LYMPH PERCENT: 21.6 %L (ref 10–50)
Platelet Count, POC: 234 10*3/uL (ref 142–424)
RBC: 4.7 M/uL (ref 4.04–5.48)
RDW, POC: 16.3 %
WBC: 7 10*3/uL (ref 4.6–10.2)

## 2014-01-06 LAB — POCT INR: INR: 2

## 2014-01-06 NOTE — Patient Instructions (Addendum)
Medicare Annual Wellness Visit  [facility] and the medical providers at [facility] strive to bring you the best medical care.  In doing so we not only want to address your current medical conditions and concerns but also to detect new conditions early and prevent illness, disease and health-related problems.    Medicare offers a yearly Wellness Visit which allows our clinical staff to assess your need for preventative services including immunizations, lifestyle education, counseling to decrease risk of preventable diseases and screening for fall risk and other medical concerns.    This visit is provided free of charge (no copay) for all Medicare recipients. The clinical pharmacists at [facility] have begun to conduct these Wellness Visits which will also include a thorough review of all your medications.    As you primary medical provider recommend that you make an appointment for your Annual Wellness Visit if you have not done so already this year.  You may set up this appointment before you leave today or you may call back ([phone]) and schedule an appointment.  Please make sure when you call that you mention that you are scheduling your Annual Wellness Visit with the clinical pharmacist so that the appointment may be made for the proper length of time.      Continue current medications. Continue good therapeutic lifestyle changes which include good diet and exercise. Fall precautions discussed with patient. If an FOBT was given today- please return it to our front desk. If you are over 10 years old - you may need Prevnar 12 or the adult Pneumonia vaccine.  Anticoagulation Dose Instructions as of 01/06/2014     Sun Mon Tue Wed Thu Fri Sat   New Dose 2.5 mg 1.25 mg 2.5 mg 1.25 mg 2.5 mg 1.25 mg 2.5 mg    Description       Continue current warfarin dose of 1 tablet on sundays, tuesdays, thursdays and saturdays.  1/2 tablet  on Mondays, Wednesdays and Fridays.     Followup of the above directions for your Coumadin Drink plenty of fluids for the summer Watch sodium intake Monitor your body for tick bites Continue to follow good therapeutic lifestyle changes which include diet and exercise Watch carbohydrate intake and sugar intake The Prevnar vaccine that you received today may make her arm sore Continue to monitor blood pressures at home

## 2014-01-06 NOTE — Progress Notes (Signed)
Subjective:    Patient ID: Kari [name], female    DOB: Dec 11, 1933, 80 y.o.   MRN: [id number]  HPI Pt here for follow up and management of chronic medical problems. The patient is today without particular complaints. She will have lab work done today and she is due to receive her Prevnar vaccine today. Her pro time will be done today also because of the atrial fibrillation. The patient is unaware of any heart irregularities. She is not sure when she will see the cardiologist again, he said 18 months after the last visit. She is up-to-date on all of her other health maintenance issues.         Patient Active Problem List   Diagnosis Date Noted  . Osteoporosis 03/11/2013  . A-fib 11/24/2012  . Hypokalemia 05/04/2012  . Atypical chest pain 05/04/2012  . Obesity   . Symptomatic menopausal or female climacteric states   . Kyphosis   . Gallstones   . Personal history of adenomatous colonic polyps   . HYPERLIPIDEMIA 03/29/2009  . HYPERTENSION 03/29/2009   Outpatient Encounter Prescriptions as of 01/06/2014  Medication Sig  . alendronate (FOSAMAX) 70 MG tablet Take 1 tablet (70 mg total) by mouth every 7 (seven) days. Take with a full glass of water on an empty stomach.  Marland Kitchen atenolol (TENORMIN) 50 MG tablet TAKE (1) TABLET TWICE A DAY.  . captopril (CAPOTEN) 50 MG tablet TAKE (1) TABLET TWICE A DAY FOR HIGH BLOOD PRESSURE.  Marland Kitchen Cholecalciferol (VITAMIN D3) 1000 UNITS CAPS Take 1 capsule by mouth every morning.   . pravastatin (PRAVACHOL) 80 MG tablet TAKE 1 TABLET ONCE DAILY FOR CHOLESTEROL  . warfarin (COUMADIN) 2.5 MG tablet TAKE 1 TABLET ONCE DAILY OR AS DIRECTED  . ZETIA 10 MG tablet TAKE 1 TABLET ONCE DAILY FOR CHOLESTEROL  . [DISCONTINUED] amLODipine-valsartan (EXFORGE) 5-320 MG per tablet Take 0.5 tablets by mouth daily.    Review of Systems  Constitutional: Negative.   HENT: Negative.   Eyes: Negative.   Respiratory: Negative.   Cardiovascular: Negative.   Gastrointestinal:  Negative.   Endocrine: Negative.   Genitourinary: Negative.   Musculoskeletal: Negative.   Skin: Negative.   Allergic/Immunologic: Negative.   Neurological: Negative.   Hematological: Negative.   Psychiatric/Behavioral: Negative.        Objective:   Physical Exam  Nursing note and vitals reviewed. Constitutional: She is oriented to person, place, and time. She appears well-developed and well-nourished. No distress.  The patient is alert and feisty for her age. She is pleasant and cooperative .  HENT:  Head: Normocephalic and atraumatic.  Right Ear: External ear normal.  Left Ear: External ear normal.  Nose: Nose normal.  Mouth/Throat: Oropharynx is clear and moist. No oropharyngeal exudate.  Eyes: Conjunctivae and EOM are normal. Pupils are equal, round, and reactive to light. Right eye exhibits no discharge. Left eye exhibits no discharge. No scleral icterus.  Neck: Normal range of motion. Neck supple. No thyromegaly present.  Cardiovascular: Normal rate, normal heart sounds and intact distal pulses.  Exam reveals no gallop and no friction rub.   No murmur heard. The rhythm is irregular irregular at 72 per minute  Pulmonary/Chest: Effort normal and breath sounds normal. No respiratory distress. She has no wheezes. She has no rales. She exhibits no tenderness.  Abdominal: Soft. Bowel sounds are normal. She exhibits no mass. There is no tenderness. There is no rebound and no guarding.  Musculoskeletal: Normal range of motion. She exhibits no edema  and no tenderness.  Lymphadenopathy:    She has no cervical adenopathy.  Neurological: She is alert and oriented to person, place, and time. She has normal reflexes. No cranial nerve deficit.  Skin: Skin is warm and dry. No rash noted.  Psychiatric: She has a normal mood and affect. Her behavior is normal. Judgment and thought content normal.   BP 154/88  Pulse 78  Temp(Src) 97.8 F (36.6 C) (Oral)  Ht 5' 0.5" (1.537 m)  Wt 154 lb  (69.854 kg)  BMI 29.57 kg/m2        Assessment & Plan:  1. A-fib - POCT CBC - POCT INR  2. HYPERLIPIDEMIA - POCT CBC - NMR, lipoprofile  3. HYPERTENSION - POCT CBC - BMP8+EGFR - Hepatic function panel  4. Vitamin D deficiency - Vit D  25 hydroxy (rtn osteoporosis monitoring)  5. Metabolic syndrome   Patient Instructions                        Medicare Annual Wellness Visit  [facility] and the medical providers at [facility] strive to bring you the best medical care.  In doing so we not only want to address your current medical conditions and concerns but also to detect new conditions early and prevent illness, disease and health-related problems.    Medicare offers a yearly Wellness Visit which allows our clinical staff to assess your need for preventative services including immunizations, lifestyle education, counseling to decrease risk of preventable diseases and screening for fall risk and other medical concerns.    This visit is provided free of charge (no copay) for all Medicare recipients. The clinical pharmacists at [facility] have begun to conduct these Wellness Visits which will also include a thorough review of all your medications.    As you primary medical provider recommend that you make an appointment for your Annual Wellness Visit if you have not done so already this year.  You may set up this appointment before you leave today or you may call back [phone]) and schedule an appointment.  Please make sure when you call that you mention that you are scheduling your Annual Wellness Visit with the clinical pharmacist so that the appointment may be made for the proper length of time.      Continue current medications. Continue good therapeutic lifestyle changes which include good diet and exercise. Fall precautions discussed with patient. If an FOBT was given today- please return it to our front desk. If you are  over 40 years old - you may need Prevnar 64 or the adult Pneumonia vaccine.  Anticoagulation Dose Instructions as of 01/06/2014     Sun Mon Tue Wed Thu Fri Sat   New Dose 2.5 mg 1.25 mg 2.5 mg 1.25 mg 2.5 mg 1.25 mg 2.5 mg    Description       Continue current warfarin dose of 1 tablet on sundays, tuesdays, thursdays and saturdays.  1/2 tablet on Mondays, Wednesdays and Fridays.     Followup of the above directions for your Coumadin Drink plenty of fluids for the summer Watch sodium intake Monitor your body for tick bites Continue to follow good therapeutic lifestyle changes which include diet and exercise Watch carbohydrate intake and sugar intake The Prevnar vaccine that you received today may make her arm sore Continue to monitor blood pressures at home   Arrie Senate MD

## 2014-01-07 LAB — BMP8+EGFR
BUN/Creatinine Ratio: 9 — ABNORMAL LOW (ref 11–26)
BUN: 6 mg/dL — ABNORMAL LOW (ref 8–27)
CO2: 24 mmol/L (ref 18–29)
Calcium: 9.4 mg/dL (ref 8.7–10.3)
Chloride: 101 mmol/L (ref 97–108)
Creatinine, Ser: 0.69 mg/dL (ref 0.57–1.00)
GFR calc Af Amer: 97 mL/min/{1.73_m2}
GFR calc non Af Amer: 84 mL/min/{1.73_m2}
Glucose: 106 mg/dL — ABNORMAL HIGH (ref 65–99)
Potassium: 3.7 mmol/L (ref 3.5–5.2)
Sodium: 141 mmol/L (ref 134–144)

## 2014-01-07 LAB — NMR, LIPOPROFILE
Cholesterol: 131 mg/dL (ref <200)
HDL Cholesterol by NMR: 40 mg/dL (ref >=40)
HDL Particle Number: 28.3 umol/L — ABNORMAL LOW (ref >=30.5)
LDL Particle Number: 919 nmol/L (ref <1000)
LDL Size: 20.6 nm (ref >20.5)
LDLC SERPL CALC-MCNC: 68 mg/dL (ref <100)
LP-IR Score: 67 — ABNORMAL HIGH (ref <=45)
SMALL LDL PARTICLE NUMBER: 475 nmol/L (ref <=527)
Triglycerides by NMR: 115 mg/dL (ref <150)

## 2014-01-07 LAB — HEPATIC FUNCTION PANEL
ALK PHOS: 83 IU/L (ref 39–117)
ALT: 10 IU/L (ref 0–32)
AST: 23 IU/L (ref 0–40)
Albumin: 4 g/dL (ref 3.5–4.8)
Bilirubin, Direct: 0.16 mg/dL (ref 0.00–0.40)
Total Bilirubin: 0.6 mg/dL (ref 0.0–1.2)
Total Protein: 7.1 g/dL (ref 6.0–8.5)

## 2014-01-07 LAB — VITAMIN D 25 HYDROXY (VIT D DEFICIENCY, FRACTURES): VIT D 25 HYDROXY: 17.4 ng/mL — AB (ref 30.0–100.0)

## 2014-01-21 ENCOUNTER — Ambulatory Visit (INDEPENDENT_AMBULATORY_CARE_PROVIDER_SITE_OTHER): Payer: Medicare Other | Admitting: Pharmacist

## 2014-01-21 DIAGNOSIS — I4891 Unspecified atrial fibrillation: Secondary | ICD-10-CM | POA: Diagnosis not present

## 2014-01-21 LAB — POCT INR: INR: 1.8

## 2014-01-21 NOTE — Patient Instructions (Signed)
Anticoagulation Dose Instructions as of 01/21/2014     Dorene Grebe Tue Wed Thu Fri Sat   New Dose 2.5 mg 1.25 mg 2.5 mg 1.25 mg 2.5 mg 1.25 mg 2.5 mg    Description       Take 1 and 1/2 today, then start new dose of warfarin of 1 tablet on sundays, tuesdays, wednesdays, thursdays and saturdays.  1/2 tablet on Mondays,  and Fridays.      INR was 1.8 today

## 2014-02-02 DIAGNOSIS — B351 Tinea unguium: Secondary | ICD-10-CM | POA: Diagnosis not present

## 2014-02-02 DIAGNOSIS — M79609 Pain in unspecified limb: Secondary | ICD-10-CM | POA: Diagnosis not present

## 2014-02-02 DIAGNOSIS — I70209 Unspecified atherosclerosis of native arteries of extremities, unspecified extremity: Secondary | ICD-10-CM | POA: Diagnosis not present

## 2014-02-03 ENCOUNTER — Ambulatory Visit (INDEPENDENT_AMBULATORY_CARE_PROVIDER_SITE_OTHER): Payer: Medicare Other | Admitting: Pharmacist

## 2014-02-03 DIAGNOSIS — I4891 Unspecified atrial fibrillation: Secondary | ICD-10-CM

## 2014-02-03 LAB — POCT INR: INR: 1.7

## 2014-02-03 NOTE — Patient Instructions (Signed)
Anticoagulation Dose Instructions as of 02/03/2014     Kari [name] Tue Wed Thu Fri Sat   New Dose 2.5 mg 1.25 mg 2.5 mg 2.5 mg 2.5 mg 2.5 mg 2.5 mg    Description       Take 1 and 1/2 today, then start new dose of warfarin of 1/2 tablet on Mondays.  Take 1 tablet all other days.      INR was 1.7 today

## 2014-02-18 ENCOUNTER — Encounter: Payer: Self-pay | Admitting: *Deleted

## 2014-02-19 ENCOUNTER — Ambulatory Visit (INDEPENDENT_AMBULATORY_CARE_PROVIDER_SITE_OTHER): Payer: Medicare Other | Admitting: Pharmacist

## 2014-02-19 ENCOUNTER — Encounter: Payer: Self-pay | Admitting: Pharmacist

## 2014-02-19 VITALS — BP 140/78 | HR 77 | Ht 60.5 in | Wt 155.0 lb

## 2014-02-19 DIAGNOSIS — R739 Hyperglycemia, unspecified: Secondary | ICD-10-CM

## 2014-02-19 DIAGNOSIS — Z0000 Encounter for general adult medical examination without abnormal findings: Secondary | ICD-10-CM

## 2014-02-19 DIAGNOSIS — I482 Chronic atrial fibrillation, unspecified: Secondary | ICD-10-CM

## 2014-02-19 DIAGNOSIS — I4891 Unspecified atrial fibrillation: Secondary | ICD-10-CM

## 2014-02-19 DIAGNOSIS — R7309 Other abnormal glucose: Secondary | ICD-10-CM

## 2014-02-19 LAB — POCT INR: INR: 2.6

## 2014-02-19 LAB — POCT GLYCOSYLATED HEMOGLOBIN (HGB A1C): Hemoglobin A1C: 5.9

## 2014-02-19 NOTE — Patient Instructions (Addendum)
Anticoagulation Dose Instructions as of 02/19/2014     Kari [name] Tue Wed Thu Fri Sat   New Dose 2.5 mg 1.25 mg 2.5 mg 2.5 mg 2.5 mg 2.5 mg 2.5 mg    Description       Continue current dose of warfarin of 1/2 tablet on Mondays.  Take 1 tablet all other days.      INR was 2.6 today    Health Maintenance Summary    INFLUENZA VACCINE Next Due 04/03/2014  Last 09/252014    MAMMOGRAM Next Due 05/04/2014  Last 05/04/2013    COLONOSCOPY Next Due 02/26/2017  Last 02/27/2012   Pneumonia Vaccine Completed     Zostavax / shingles Completed     Eye Exam Next due  In 1-2 months    Dexa / Bone Density Next due 03/12/2015 Last 03/11/2013    TETANUS/TDAP Next Due 10/04/2017 Last 10/05/2007    Preventive Care for Adults A healthy lifestyle and preventive care can promote health and wellness. Preventive health guidelines for women include the following key practices.  A routine yearly physical is a good way to check with your health care provider about your health and preventive screening. It is a chance to share any concerns and updates on your health and to receive a thorough exam.  Visit your dentist for a routine exam and preventive care every 6 months. Brush your teeth twice a day and floss once a day. Good oral hygiene prevents tooth decay and gum disease.  The frequency of eye exams is based on your age, health, family medical history, use of contact lenses, and other factors. Follow your health care provider's recommendations for frequency of eye exams.  Eat a healthy diet. Foods like vegetables, fruits, whole grains, low-fat dairy products, and lean protein foods contain the nutrients you need without too many calories. Decrease your intake of foods high in solid fats, added sugars, and salt. Eat the right amount of calories for you.Get information about a proper diet from your health care provider, if necessary.  Regular physical exercise is one of the most important things you can do for your  health. Most adults should get at least 150 minutes of moderate-intensity exercise (any activity that increases your heart rate and causes you to sweat) each week. In addition, most adults need muscle-strengthening exercises on 2 or more days a week.  Maintain a healthy weight. The body mass index (BMI) is a screening tool to identify possible weight problems. It provides an estimate of body fat based on height and weight. Your health care provider can find your BMI, and can help you achieve or maintain a healthy weight.For adults 20 years and older:  A BMI below 18.5 is considered underweight.  A BMI of 18.5 to 24.9 is normal.  A BMI of 25 to 29.9 is considered overweight.  A BMI of 30 and above is considered obese.  Maintain normal blood lipids and cholesterol levels by exercising and minimizing your intake of saturated fat. Eat a balanced diet with plenty of fruit and vegetables. Blood tests for lipids and cholesterol should begin at age 50 and be repeated every 5 years. If your lipid or cholesterol levels are high, you are over 50, or you are at high risk for heart disease, you may need your cholesterol levels checked more frequently.Ongoing high lipid and cholesterol levels should be treated with medicines if diet and exercise are not working.  If you smoke, find out from your health care provider  how to quit. If you do not use tobacco, do not start.  Lung cancer screening is recommended for adults aged 28-80 years who are at high risk for developing lung cancer because of a history of smoking. A yearly low-dose CT scan of the lungs is recommended for people who have at least a 30-pack-year history of smoking and are a current smoker or have quit within the past 15 years. A pack year of smoking is smoking an average of 1 pack of cigarettes a day for 1 year (for example: 1 pack a day for 30 years or 2 packs a day for 15 years). Yearly screening should continue until the smoker has stopped  smoking for at least 15 years. Yearly screening should be stopped for people who develop a health problem that would prevent them from having lung cancer treatment.  If you are pregnant, do not drink alcohol. If you are breastfeeding, be very cautious about drinking alcohol. If you are not pregnant and choose to drink alcohol, do not have more than 1 drink per day. One drink is considered to be 12 ounces (355 mL) of beer, 5 ounces (148 mL) of wine, or 1.5 ounces (44 mL) of liquor.  Avoid use of street drugs. Do not share needles with anyone. Ask for help if you need support or instructions about stopping the use of drugs.  High blood pressure causes heart disease and increases the risk of stroke. Your blood pressure should be checked at least every 1 to 2 years. Ongoing high blood pressure should be treated with medicines if weight loss and exercise do not work.  If you are 64-9 years old, ask your health care provider if you should take aspirin to prevent strokes.  Diabetes screening involves taking a blood sample to check your fasting blood sugar level. This should be done once every 3 years, after age 63, if you are within normal weight and without risk factors for diabetes. Testing should be considered at a younger age or be carried out more frequently if you are overweight and have at least 1 risk factor for diabetes.  Breast cancer screening is essential preventive care for women. You should practice "breast self-awareness." This means understanding the normal appearance and feel of your breasts and may include breast self-examination. Any changes detected, no matter how small, should be reported to a health care provider. Women in their 53s and 30s should have a clinical breast exam (CBE) by a health care provider as part of a regular health exam every 1 to 3 years. After age 39, women should have a CBE every year. Starting at age 69, women should consider having a mammogram (breast X-ray test)  every year. Women who have a family history of breast cancer should talk to their health care provider about genetic screening. Women at a high risk of breast cancer should talk to their health care providers about having an MRI and a mammogram every year.  Breast cancer gene (BRCA)-related cancer risk assessment is recommended for women who have family members with BRCA-related cancers. BRCA-related cancers include breast, ovarian, tubal, and peritoneal cancers. Having family members with these cancers may be associated with an increased risk for harmful changes (mutations) in the breast cancer genes BRCA1 and BRCA2. Results of the assessment will determine the need for genetic counseling and BRCA1 and BRCA2 testing.  Routine pelvic exams to screen for cancer are no longer recommended for nonpregnant women who are considered low risk for cancer of  the pelvic organs (ovaries, uterus, and vagina) and who do not have symptoms. Ask your health care provider if a screening pelvic exam is right for you.  If you have had past treatment for cervical cancer or a condition that could lead to cancer, you need Pap tests and screening for cancer for at least 20 years after your treatment. If Pap tests have been discontinued, your risk factors (such as having a new sexual partner) need to be reassessed to determine if screening should be resumed. Some women have medical problems that increase the chance of getting cervical cancer. In these cases, your health care provider may recommend more frequent screening and Pap tests.  The HPV test is an additional test that may be used for cervical cancer screening. The HPV test looks for the virus that can cause the cell changes on the cervix. The cells collected during the Pap test can be tested for HPV. The HPV test could be used to screen women aged 46 years and older, and should be used in women of any age who have unclear Pap test results. After the age of 14, women should  have HPV testing at the same frequency as a Pap test.  Colorectal cancer can be detected and often prevented. Most routine colorectal cancer screening begins at the age of 69 years and continues through age 77 years. However, your health care provider may recommend screening at an earlier age if you have risk factors for colon cancer. On a yearly basis, your health care provider may provide home test kits to check for hidden blood in the stool. Use of a small camera at the end of a tube, to directly examine the colon (sigmoidoscopy or colonoscopy), can detect the earliest forms of colorectal cancer. Talk to your health care provider about this at age 47, when routine screening begins. Direct exam of the colon should be repeated every 5-10 years through age 73 years, unless early forms of pre-cancerous polyps or small growths are found.  People who are at an increased risk for hepatitis B should be screened for this virus. You are considered at high risk for hepatitis B if:  You were born in a country where hepatitis B occurs often. Talk with your health care provider about which countries are considered high risk.  Your parents were born in a high-risk country and you have not received a shot to protect against hepatitis B (hepatitis B vaccine).  You have HIV or AIDS.  You use needles to inject street drugs.  You live with, or have sex with, someone who has Hepatitis B.  You get hemodialysis treatment.  You take certain medicines for conditions like cancer, organ transplantation, and autoimmune conditions.  Hepatitis C blood testing is recommended for all people born from [birth year] through 1965 and any individual with known risks for hepatitis C.  Practice safe sex. Use condoms and avoid high-risk sexual practices to reduce the spread of sexually transmitted infections (STIs). STIs include gonorrhea, chlamydia, syphilis, trichomonas, herpes, HPV, and human immunodeficiency virus (HIV). Herpes, HIV,  and HPV are viral illnesses that have no cure. They can result in disability, cancer, and death.  You should be screened for sexually transmitted illnesses (STIs) including gonorrhea and chlamydia if:  You are sexually active and are younger than 24 years.  You are older than 24 years and your health care provider tells you that you are at risk for this type of infection.  Your sexual activity has changed since  you were last screened and you are at an increased risk for chlamydia or gonorrhea. Ask your health care provider if you are at risk.  If you are at risk of being infected with HIV, it is recommended that you take a prescription medicine daily to prevent HIV infection. This is called preexposure prophylaxis (PrEP). You are considered at risk if:  You are a heterosexual woman, are sexually active, and are at increased risk for HIV infection.  You take drugs by injection.  You are sexually active with a partner who has HIV.  Talk with your health care provider about whether you are at high risk of being infected with HIV. If you choose to begin PrEP, you should first be tested for HIV. You should then be tested every 3 months for as long as you are taking PrEP.  Osteoporosis is a disease in which the bones lose minerals and strength with aging. This can result in serious bone fractures or breaks. The risk of osteoporosis can be identified using a bone density scan. Women ages 42 years and over and women at risk for fractures or osteoporosis should discuss screening with their health care providers. Ask your health care provider whether you should take a calcium supplement or vitamin D to reduce the rate of osteoporosis.  Menopause can be associated with physical symptoms and risks. Hormone replacement therapy is available to decrease symptoms and risks. You should talk to your health care provider about whether hormone replacement therapy is right for you.  Use sunscreen. Apply sunscreen  liberally and repeatedly throughout the day. You should seek shade when your shadow is shorter than you. Protect yourself by wearing long sleeves, pants, a wide-brimmed hat, and sunglasses year round, whenever you are outdoors.  Once a month, do a whole body skin exam, using a mirror to look at the skin on your back. Tell your health care provider of new moles, moles that have irregular borders, moles that are larger than a pencil eraser, or moles that have changed in shape or color.  Stay current with required vaccines (immunizations).  Influenza vaccine. All adults should be immunized every year.  Tetanus, diphtheria, and acellular pertussis (Td, Tdap) vaccine. Pregnant women should receive 1 dose of Tdap vaccine during each pregnancy. The dose should be obtained regardless of the length of time since the last dose. Immunization is preferred during the 27th-36th week of gestation. An adult who has not previously received Tdap or who does not know her vaccine status should receive 1 dose of Tdap. This initial dose should be followed by tetanus and diphtheria toxoids (Td) booster doses every 10 years. Adults with an unknown or incomplete history of completing a 3-dose immunization series with Td-containing vaccines should begin or complete a primary immunization series including a Tdap dose. Adults should receive a Td booster every 10 years.  Varicella vaccine. An adult without evidence of immunity to varicella should receive 2 doses or a second dose if she has previously received 1 dose. Pregnant females who do not have evidence of immunity should receive the first dose after pregnancy. This first dose should be obtained before leaving the health care facility. The second dose should be obtained 4-8 weeks after the first dose.  Human papillomavirus (HPV) vaccine. Females aged 13-26 years who have not received the vaccine previously should obtain the 3-dose series. The vaccine is not recommended for use  in pregnant females. However, pregnancy testing is not needed before receiving a dose. If a  female is found to be pregnant after receiving a dose, no treatment is needed. In that case, the remaining doses should be delayed until after the pregnancy. Immunization is recommended for any person with an immunocompromised condition through the age of 91 years if she did not get any or all doses earlier. During the 3-dose series, the second dose should be obtained 4-8 weeks after the first dose. The third dose should be obtained 24 weeks after the first dose and 16 weeks after the second dose.  Zoster vaccine. One dose is recommended for adults aged 54 years or older unless certain conditions are present.  Measles, mumps, and rubella (MMR) vaccine. Adults born before [birth year] generally are considered immune to measles and mumps. Adults born in [birth year] or later should have 1 or more doses of MMR vaccine unless there is a contraindication to the vaccine or there is laboratory evidence of immunity to each of the three diseases. A routine second dose of MMR vaccine should be obtained at least 28 days after the first dose for students attending postsecondary schools, health care workers, or international travelers. People who received inactivated measles vaccine or an unknown type of measles vaccine during 1963-1967 should receive 2 doses of MMR vaccine. People who received inactivated mumps vaccine or an unknown type of mumps vaccine before 1979 and are at high risk for mumps infection should consider immunization with 2 doses of MMR vaccine. For females of childbearing age, rubella immunity should be determined. If there is no evidence of immunity, females who are not pregnant should be vaccinated. If there is no evidence of immunity, females who are pregnant should delay immunization until after pregnancy. Unvaccinated health care workers born before [birth year] who lack laboratory evidence of measles, mumps, or rubella immunity or  laboratory confirmation of disease should consider measles and mumps immunization with 2 doses of MMR vaccine or rubella immunization with 1 dose of MMR vaccine.  Pneumococcal 13-valent conjugate (PCV13) vaccine. When indicated, a person who is uncertain of her immunization history and has no record of immunization should receive the PCV13 vaccine. An adult aged 72 years or older who has certain medical conditions and has not been previously immunized should receive 1 dose of PCV13 vaccine. This PCV13 should be followed with a dose of pneumococcal polysaccharide (PPSV23) vaccine. The PPSV23 vaccine dose should be obtained at least 8 weeks after the dose of PCV13 vaccine. An adult aged 43 years or older who has certain medical conditions and previously received 1 or more doses of PPSV23 vaccine should receive 1 dose of PCV13. The PCV13 vaccine dose should be obtained 1 or more years after the last PPSV23 vaccine dose.  Pneumococcal polysaccharide (PPSV23) vaccine. When PCV13 is also indicated, PCV13 should be obtained first. All adults aged 34 years and older should be immunized. An adult younger than age 110 years who has certain medical conditions should be immunized. Any person who resides in a nursing home or long-term care facility should be immunized. An adult smoker should be immunized. People with an immunocompromised condition and certain other conditions should receive both PCV13 and PPSV23 vaccines. People with human immunodeficiency virus (HIV) infection should be immunized as soon as possible after diagnosis. Immunization during chemotherapy or radiation therapy should be avoided. Routine use of PPSV23 vaccine is not recommended for American Indians, [place] Natives, or people younger than 65 years unless there are medical conditions that require PPSV23 vaccine. When indicated, people who have unknown immunization and have no record  of immunization should receive PPSV23 vaccine. One-time revaccination  5 years after the first dose of PPSV23 is recommended for people aged 19-64 years who have chronic kidney failure, nephrotic syndrome, asplenia, or immunocompromised conditions. People who received 1-2 doses of PPSV23 before age 78 years should receive another dose of PPSV23 vaccine at age 30 years or later if at least 5 years have passed since the previous dose. Doses of PPSV23 are not needed for people immunized with PPSV23 at or after age 89 years.  Meningococcal vaccine. Adults with asplenia or persistent complement component deficiencies should receive 2 doses of quadrivalent meningococcal conjugate (MenACWY-D) vaccine. The doses should be obtained at least 2 months apart. Microbiologists working with certain meningococcal bacteria, [employer] recruits, people at risk during an outbreak, and people who travel to or live in countries with a high rate of meningitis should be immunized. A first-year college student up through age 35 years who is living in a residence hall should receive a dose if she did not receive a dose on or after her 16th birthday. Adults who have certain high-risk conditions should receive one or more doses of vaccine.  Hepatitis A vaccine. Adults who wish to be protected from this disease, have certain high-risk conditions, work with hepatitis A-infected animals, work in hepatitis A research labs, or travel to or work in countries with a high rate of hepatitis A should be immunized. Adults who were previously unvaccinated and who anticipate close contact with an international adoptee during the first 60 days after arrival in the [place] from a country with a high rate of hepatitis A should be immunized.  Hepatitis B vaccine. Adults who wish to be protected from this disease, have certain high-risk conditions, may be exposed to blood or other infectious body fluids, are household contacts or sex partners of hepatitis B positive people, are clients or workers in certain care  facilities, or travel to or work in countries with a high rate of hepatitis B should be immunized. Ages 6 years and over  Blood pressure check.** / Every 1 to 2 years.  Lipid and cholesterol check.** / Every 5 years beginning at age 26 years.  Lung cancer screening. / Every year if you are aged 55-80 years and have a 30-pack-year history of smoking and currently smoke or have quit within the past 15 years. Yearly screening is stopped once you have quit smoking for at least 15 years or develop a health problem that would prevent you from having lung cancer treatment.  Clinical breast exam.** / Every year after age 52 years.  BRCA-related cancer risk assessment.** / For women who have family members with a BRCA-related cancer (breast, ovarian, tubal, or peritoneal cancers).  Mammogram.** / Every year beginning at age 53 years and continuing for as long as you are in good health. Consult with your health care provider.  Pap test.** / Every 3 years starting at age 64 years through age 22 or 31 years with 3 consecutive normal Pap tests. Testing can be stopped between 65 and 70 years with 3 consecutive normal Pap tests and no abnormal Pap or HPV tests in the past 10 years.  HPV screening.** / Every 3 years from ages 77 years through ages 45 or 49 years with a history of 3 consecutive normal Pap tests. Testing can be stopped between 65 and 70 years with 3 consecutive normal Pap tests and no abnormal Pap or HPV tests in the past 10 years.  Fecal occult blood  test (FOBT) of stool. / Every year beginning at age 53 years and continuing until age 78 years. You may not need to do this test if you get a colonoscopy every 10 years.  Flexible sigmoidoscopy or colonoscopy.** / Every 5 years for a flexible sigmoidoscopy or every 10 years for a colonoscopy beginning at age 1 years and continuing until age 56 years.  Hepatitis C blood test.** / For all people born from [birth year] through 1963 and any individual with  known risks for hepatitis C.  Osteoporosis screening.** / A one-time screening for women ages 76 years and over and women at risk for fractures or osteoporosis.  Skin self-exam. / Monthly.  Influenza vaccine. / Every year.  Tetanus, diphtheria, and acellular pertussis (Tdap/Td) vaccine.** / 1 dose of Td every 10 years.  Varicella vaccine.** / Consult your health care provider.  Zoster vaccine.** / 1 dose for adults aged 66 years or older.  Pneumococcal 13-valent conjugate (PCV13) vaccine.** / Consult your health care provider.  Pneumococcal polysaccharide (PPSV23) vaccine.** / 1 dose for all adults aged 16 years and older.  Meningococcal vaccine.** / Consult your health care provider.  Hepatitis A vaccine.** / Consult your health care provider.  Hepatitis B vaccine.** / Consult your health care provider.  Haemophilus influenzae type b (Hib) vaccine.** / Consult your health care provider. ** Family history and personal history of risk and conditions may change your health care provider's recommendations. Document Released: 10/16/2001 Document Revised: 08/25/2013 Document Reviewed: 01/15/2011 [facility] Patient Information 2015 [place], [place]. This information is not intended to replace advice given to you by your health care provider. Make sure you discuss any questions you have with your health care provider.

## 2014-02-19 NOTE — Progress Notes (Signed)
Subjective:    Kari [name] is a 80 y.o. female who presents for Medicare Initial Wellness Visit and to recheck protime (chronic anticoagulation therapy) Also patient's FBG has been slightly elevated the last 3-4 times checked - checking A1c today.  Preventive Screening-Counseling & Management  Tobacco History  Smoking status  . Never Smoker   Smokeless tobacco  . Never Used    Current Problems (verified) Patient Active Problem List   Diagnosis Date Noted  . Metabolic syndrome 24/23/5361  . Osteoporosis 03/11/2013  . A-fib 11/24/2012  . Hypokalemia 05/04/2012  . Atypical chest pain 05/04/2012  . Obesity   . Symptomatic menopausal or female climacteric states   . Kyphosis   . Gallstones   . Personal history of adenomatous colonic polyps   . HYPERLIPIDEMIA 03/29/2009  . HYPERTENSION 03/29/2009    Medications Prior to Visit Current Outpatient Prescriptions on File Prior to Visit  Medication Sig Dispense Refill  . alendronate (FOSAMAX) 70 MG tablet Take 1 tablet (70 mg total) by mouth every 7 (seven) days. Take with a full glass of water on an empty stomach.  12 tablet  prn  . atenolol (TENORMIN) 50 MG tablet TAKE (1) TABLET TWICE A DAY.  60 tablet  5  . captopril (CAPOTEN) 50 MG tablet TAKE (1) TABLET TWICE A DAY FOR HIGH BLOOD PRESSURE.  60 tablet  5  . Cholecalciferol (VITAMIN D3) 1000 UNITS CAPS Take 1 capsule by mouth every morning.       . pravastatin (PRAVACHOL) 80 MG tablet TAKE 1 TABLET ONCE DAILY FOR CHOLESTEROL  30 tablet  5  . warfarin (COUMADIN) 2.5 MG tablet TAKE 1 TABLET ONCE DAILY OR AS DIRECTED  30 tablet  2  . ZETIA 10 MG tablet TAKE 1 TABLET ONCE DAILY FOR CHOLESTEROL  30 tablet  5   No current facility-administered medications on file prior to visit.    Current Medications (verified) Current Outpatient Prescriptions  Medication Sig Dispense Refill  . alendronate (FOSAMAX) 70 MG tablet Take 1 tablet (70 mg total) by mouth every 7 (seven) days. Take  with a full glass of water on an empty stomach.  12 tablet  prn  . atenolol (TENORMIN) 50 MG tablet TAKE (1) TABLET TWICE A DAY.  60 tablet  5  . captopril (CAPOTEN) 50 MG tablet TAKE (1) TABLET TWICE A DAY FOR HIGH BLOOD PRESSURE.  60 tablet  5  . Cholecalciferol (VITAMIN D3) 1000 UNITS CAPS Take 1 capsule by mouth every morning.       . pravastatin (PRAVACHOL) 80 MG tablet TAKE 1 TABLET ONCE DAILY FOR CHOLESTEROL  30 tablet  5  . warfarin (COUMADIN) 2.5 MG tablet TAKE 1 TABLET ONCE DAILY OR AS DIRECTED  30 tablet  2  . ZETIA 10 MG tablet TAKE 1 TABLET ONCE DAILY FOR CHOLESTEROL  30 tablet  5   No current facility-administered medications for this visit.     Allergies (verified) Erythromycin and Penicillins   PAST HISTORY  Family History Family History  Problem Relation Age of Onset  . Heart attack Mother   . Stroke Mother   . Heart attack Father   . Alcohol abuse Father   . Heart attack Other   . Stroke Other   . Breast cancer Other   . Colon cancer Neg Hx   . Stroke Brother   . Heart attack Brother   . Depression Brother     suicide    Social History History  Substance Use  Topics  . Smoking status: Never Smoker   . Smokeless tobacco: Never Used  . Alcohol Use: No     Are there smokers in your home (other than you)? No  Risk Factors Current exercise habits: The patient does not participate in regular exercise at present.    Cardiac risk factors: advanced age (older than 59 for men, 56 for women), dyslipidemia, family history of premature cardiovascular disease, hypertension and sedentary lifestyle.  Depression Screen (Note: if answer to either of the following is "Yes", a more complete depression screening is indicated)   Over the past 2 weeks, have you felt down, depressed or hopeless? No  Over the past 2 weeks, have you felt little interest or pleasure in doing things? No  Have you lost interest or pleasure in daily life? No  Do you often feel hopeless?  No  Do you cry easily over simple problems? No  Activities of Daily Living In your present state of health, do you have any difficulty performing the following activities?:  Driving? No Managing money?  No Feeding yourself? No Getting from bed to chair? No Climbing a flight of stairs? No Preparing food and eating?: No Bathing or showering? No Getting dressed: No Getting to the toilet? No Using the toilet:No Moving around from place to place: No In the past year have you fallen or had a near fall?:No   Are you sexually active?  No  Do you have more than one partner?  No  Hearing Difficulties: No Do you often ask people to speak up or repeat themselves? No Do you experience ringing or noises in your ears? No Do you have difficulty understanding soft or whispered voices? No   Do you feel that you have a problem with memory? No  Do you often misplace items? No  Do you feel safe at home?  Yes  Cognitive Testing  Alert? Yes  Normal Appearance?Yes  Oriented to person? Yes  Place? Yes   Time? Yes  Recall of three objects?  Yes  Can perform simple calculations? Yes  Displays appropriate judgment?Yes  Can read the correct time from a watch face?Yes   Advanced Directives have been discussed with the patient? Yes  List the Names of Other Physician/Practitioners you currently use: 1.  Opthmalogist - Patient cannot recall name but is in [place], [place] 2.  Cardiologist - Hocherin, MD 3.  GI - Silvano Rusk, MD 4.  Neurologist - Earnie Larsson, MD 5.  Urologist - Shana Chute, MD  Indicate any recent Medical Services you may have received from other than [facility] providers in the past year (date may be approximate).  Immunization History  Administered Date(s) Administered  . Influenza Whole 05/04/2010  . Influenza,inj,Quad PF,36+ Mos 05/28/2013  . Pneumococcal Conjugate-13 01/06/2014  . Pneumococcal Polysaccharide-23 10/05/1999  . Td 10/05/2007    Screening Tests Health  Maintenance  Topic Date Due  . Influenza Vaccine  04/03/2014  . Mammogram  05/04/2014  . Colonoscopy  02/26/2017  . Tetanus/tdap  10/04/2017  . Pneumococcal Polysaccharide Vaccine Age 63 And Over  Completed  . Zostavax  Completed  Last DEXA was 03/11/2013  All answers were reviewed with the patient and necessary referrals were made:  Cherre Robins, [facility]   02/19/2014   History reviewed: allergies, current medications, past family history, past medical history, past social history, past surgical history and problem list   Objective:  Body mass index is 29.76 kg/(m^2). BP 140/78  Pulse 77  Ht 5' 0.5" (1.537  m)  Wt 155 lb (70.308 kg)  BMI 29.76 kg/m2    INR was 2.6 today A1c was 5.9% today Assessment:  Initial Medicare Annual Wellness Visit Therapeutic anticoagulation Pre diabetes  Plan:     During the course of the visit the patient was educated and counseled about appropriate screening and preventive services including:    Pneumococcal vaccine   Influenza vaccine  Hepatitis B vaccine  Td vaccine  Screening electrocardiogram  Screening mammography  Screening Pap smear and pelvic exam   Bone densitometry screening  Colorectal cancer screening  Diabetes screening  Glaucoma screening  Nutrition counseling   Advanced directives: has NO advanced directive - not interested in additional information  Discussed limiting high CHO containing food and recommended serving sizes, increase whole grains and non starchy vegetables.  Anticoagulation Dose Instructions as of 02/19/2014     Dorene Grebe Tue Wed Thu Fri Sat   New Dose 2.5 mg 1.25 mg 2.5 mg 2.5 mg 2.5 mg 2.5 mg 2.5 mg    Description       Continue current dose of warfarin of 1/2 tablet on Mondays.  Take 1 tablet all other days.     RTC in 1 month  Diet review for nutrition referral? Declined   Patient Instructions (the written plan) was given to the patient.  Medicare Attestation I have personally  reviewed: The patient's medical and social history Their use of alcohol, tobacco or illicit drugs Their current medications and supplements The patient's functional ability including ADLs,fall risks, home safety risks, cognitive, and hearing and visual impairment Diet and physical activities Evidence for depression or mood disorders  The patient's weight, height, BMI, and HR/BP have been recorded in the chart.  I have made referrals, counseling, and provided education to the patient based on review of the above and I have provided the patient with a written personalized care plan for preventive services.     Cherre Robins, [facility]   02/19/2014

## 2014-02-25 ENCOUNTER — Other Ambulatory Visit: Payer: Self-pay | Admitting: Nurse Practitioner

## 2014-03-11 ENCOUNTER — Ambulatory Visit (INDEPENDENT_AMBULATORY_CARE_PROVIDER_SITE_OTHER): Payer: Medicare Other | Admitting: Nurse Practitioner

## 2014-03-11 ENCOUNTER — Ambulatory Visit (INDEPENDENT_AMBULATORY_CARE_PROVIDER_SITE_OTHER): Payer: Medicare Other

## 2014-03-11 VITALS — BP 140/86 | HR 104 | Temp 98.6°F | Ht 61.0 in | Wt 153.0 lb

## 2014-03-11 DIAGNOSIS — S59909A Unspecified injury of unspecified elbow, initial encounter: Secondary | ICD-10-CM | POA: Diagnosis not present

## 2014-03-11 DIAGNOSIS — S6990XA Unspecified injury of unspecified wrist, hand and finger(s), initial encounter: Secondary | ICD-10-CM

## 2014-03-11 DIAGNOSIS — S59919A Unspecified injury of unspecified forearm, initial encounter: Secondary | ICD-10-CM

## 2014-03-11 DIAGNOSIS — S5291XA Unspecified fracture of right forearm, initial encounter for closed fracture: Secondary | ICD-10-CM

## 2014-03-11 DIAGNOSIS — S6991XA Unspecified injury of right wrist, hand and finger(s), initial encounter: Secondary | ICD-10-CM

## 2014-03-11 DIAGNOSIS — S5290XA Unspecified fracture of unspecified forearm, initial encounter for closed fracture: Secondary | ICD-10-CM | POA: Diagnosis not present

## 2014-03-11 NOTE — Progress Notes (Signed)
   Subjective:    Patient ID: Kari [name], female    DOB: 1934-01-26, 80 y.o.   MRN: [id number]  HPI Patient was throwing something away outside and turned around to fast that made her fall. She landed on her right wrist. Hurts to move. Slight swelling     Review of Systems  All other systems reviewed and are negative.      Objective:   Physical Exam  Constitutional: She appears well-developed and well-nourished.  Cardiovascular: Normal rate, regular rhythm and normal heart sounds.   Pulmonary/Chest: Effort normal and breath sounds normal.  Musculoskeletal:  Decreased rOM of right wrist to due pain with supination and pronation.  Skin: Skin is warm and dry.   Right wrist x ray- right nondisplaced raadius fracture-Preliminary reading by Ronnald Collum, FNP  [facility]        Assessment & Plan:   1. Right wrist injury, initial encounter   2. Nondisplaced fracture of right radius    Orders Placed This Encounter  Procedures  . DG Wrist Complete Right    Standing Status: Future     Number of Occurrences: 1     Standing Expiration Date: 05/11/2015    Order Specific Question:  Reason for Exam (SYMPTOM  OR DIAGNOSIS REQUIRED)    Answer:  fell    Order Specific Question:  Preferred imaging location?    Answer:  Internal  elevate arm if fingers start to swell Short arm cast applied Follow up in 3 weeks  Mary-Margaret Hassell Done, FNP

## 2014-03-11 NOTE — Patient Instructions (Signed)

## 2014-03-22 ENCOUNTER — Ambulatory Visit (INDEPENDENT_AMBULATORY_CARE_PROVIDER_SITE_OTHER): Payer: Medicare Other | Admitting: Pharmacist

## 2014-03-22 DIAGNOSIS — I4891 Unspecified atrial fibrillation: Secondary | ICD-10-CM

## 2014-03-22 LAB — POCT INR: INR: 3.2

## 2014-03-22 NOTE — Patient Instructions (Signed)
Anticoagulation Dose Instructions as of 03/22/2014     Kari [name] Tue Wed Thu Fri Sat   New Dose 2.5 mg 1.25 mg 2.5 mg 2.5 mg 2.5 mg 1.25 mg 2.5 mg    Description       No warfarin tomorrow only - Tuesday, July 21st.  Then decrease dose of warfarin to 1/2 tablet on Mondays and fridays and 1 tablet all other days.      INR was 3.2 today

## 2014-04-01 ENCOUNTER — Ambulatory Visit (INDEPENDENT_AMBULATORY_CARE_PROVIDER_SITE_OTHER): Payer: Medicare Other | Admitting: Nurse Practitioner

## 2014-04-01 ENCOUNTER — Encounter: Payer: Self-pay | Admitting: Nurse Practitioner

## 2014-04-01 ENCOUNTER — Ambulatory Visit (INDEPENDENT_AMBULATORY_CARE_PROVIDER_SITE_OTHER): Payer: Medicare Other

## 2014-04-01 VITALS — BP 152/77 | HR 83 | Temp 97.5°F | Ht 61.0 in | Wt 152.6 lb

## 2014-04-01 DIAGNOSIS — I4891 Unspecified atrial fibrillation: Secondary | ICD-10-CM

## 2014-04-01 DIAGNOSIS — T148XXA Other injury of unspecified body region, initial encounter: Secondary | ICD-10-CM

## 2014-04-01 DIAGNOSIS — S5291XA Unspecified fracture of right forearm, initial encounter for closed fracture: Secondary | ICD-10-CM

## 2014-04-01 LAB — POCT INR: INR: 2.6

## 2014-04-01 NOTE — Patient Instructions (Signed)
Anticoagulation Dose Instructions as of 04/01/2014     Kari [name] Tue Wed Thu Fri Sat   New Dose 2.5 mg 1.25 mg 2.5 mg 2.5 mg 2.5 mg 1.25 mg 2.5 mg    Description       Continue current warfarin dose of 1/2 tablet on Mondays and Fridays and 1 tablet all other days.      INR was 2.6 today

## 2014-04-01 NOTE — Progress Notes (Signed)
   Subjective:    Patient ID: Kari [name], female    DOB: Feb 06, 1934, 80 y.o.   MRN: [id number]  HPI 3 week recheck on Right distal radius fracture.  Has intermittent pain but not taking any medication for relief.      Review of Systems  Constitutional: Negative.   Respiratory: Negative.   Cardiovascular: Negative.   Skin: Negative.        Objective:   Physical Exam  Constitutional: She appears well-developed and well-nourished.  Cardiovascular: Normal rate and regular rhythm.   Pulmonary/Chest: Breath sounds normal.  Skin: Skin is warm and dry.  Brisk cap refill-no skin breakdown   BP 152/77  Pulse 83  Temp(Src) 97.5 F (36.4 C) (Oral)  Ht 5\' 1"  (1.549 m)  Wt 152 lb 9.6 oz (69.219 kg)  BMI 28.85 kg/m2'  Right wrist x ray- healing distal radius fracture with good alignment.-Preliminary reading by Ronnald Collum, FNP  [facility]      Assessment & Plan:   1. Nondisplaced fracture of right radius   2. Fracture   3. A-fib    Follow up for cast removal in 3 weeks INR at next visit with Dr. Mayra Neer, [place]

## 2014-04-14 ENCOUNTER — Other Ambulatory Visit: Payer: Self-pay | Admitting: Family Medicine

## 2014-04-15 ENCOUNTER — Encounter: Payer: Self-pay | Admitting: Family Medicine

## 2014-04-15 ENCOUNTER — Ambulatory Visit (INDEPENDENT_AMBULATORY_CARE_PROVIDER_SITE_OTHER): Payer: Medicare Other | Admitting: Family Medicine

## 2014-04-15 VITALS — BP 120/80 | HR 97 | Temp 97.2°F | Ht 61.0 in | Wt 151.0 lb

## 2014-04-15 DIAGNOSIS — I4891 Unspecified atrial fibrillation: Secondary | ICD-10-CM

## 2014-04-15 DIAGNOSIS — E8881 Metabolic syndrome: Secondary | ICD-10-CM | POA: Diagnosis not present

## 2014-04-15 DIAGNOSIS — E559 Vitamin D deficiency, unspecified: Secondary | ICD-10-CM

## 2014-04-15 DIAGNOSIS — I10 Essential (primary) hypertension: Secondary | ICD-10-CM

## 2014-04-15 DIAGNOSIS — E785 Hyperlipidemia, unspecified: Secondary | ICD-10-CM | POA: Diagnosis not present

## 2014-04-15 DIAGNOSIS — S5290XA Unspecified fracture of unspecified forearm, initial encounter for closed fracture: Secondary | ICD-10-CM

## 2014-04-15 DIAGNOSIS — I482 Chronic atrial fibrillation, unspecified: Secondary | ICD-10-CM

## 2014-04-15 DIAGNOSIS — S5291XA Unspecified fracture of right forearm, initial encounter for closed fracture: Secondary | ICD-10-CM

## 2014-04-15 LAB — POCT CBC
GRANULOCYTE PERCENT: 74.6 % (ref 37–80)
HCT, POC: 41.2 % (ref 37.7–47.9)
HEMOGLOBIN: 12.9 g/dL (ref 12.2–16.2)
LYMPH, POC: 1.4 (ref 0.6–3.4)
MCH: 24.9 pg — AB (ref 27–31.2)
MCHC: 31.3 g/dL — AB (ref 31.8–35.4)
MCV: 79.5 fL — AB (ref 80–97)
MPV: 7.9 fL (ref 0–99.8)
PLATELET COUNT, POC: 238 10*3/uL (ref 142–424)
POC GRANULOCYTE: 5.1 (ref 2–6.9)
POC LYMPH PERCENT: 20.7 %L (ref 10–50)
RBC: 5.2 M/uL (ref 4.04–5.48)
RDW, POC: 18.7 %
WBC: 6.8 10*3/uL (ref 4.6–10.2)

## 2014-04-15 NOTE — Progress Notes (Signed)
Subjective:    Patient ID: Kari [name], female    DOB: [birth year], 80 y.o.   MRN: [id number]  HPI Pt here for follow up and management of chronic medical problems. It is of note that the patient had a recent fall and sustained a bad sprain to her right arm and also sustained a fracture. She hopes to get the cast off in one week. She is due to get lab work but not an A1c. With the fall she did see another provider. She indicates that her home blood pressures have been running in the 120s over the 70s.         Patient Active Problem List   Diagnosis Date Noted  . Metabolic syndrome 45/36/4680  . Osteoporosis 03/11/2013  . A-fib 11/24/2012  . Hypokalemia 05/04/2012  . Atypical chest pain 05/04/2012  . Obesity   . Symptomatic menopausal or female climacteric states   . Kyphosis   . Gallstones   . Personal history of adenomatous colonic polyps   . HYPERLIPIDEMIA 03/29/2009  . HYPERTENSION 03/29/2009   Outpatient Encounter Prescriptions as of 04/15/2014  Medication Sig  . alendronate (FOSAMAX) 70 MG tablet Take 1 tablet (70 mg total) by mouth every 7 (seven) days. Take with a full glass of water on an empty stomach.  Marland Kitchen atenolol (TENORMIN) 50 MG tablet TAKE (1) TABLET TWICE A DAY.  . captopril (CAPOTEN) 50 MG tablet TAKE (1) TABLET TWICE A DAY FOR HIGH BLOOD PRESSURE.  Marland Kitchen Cholecalciferol (VITAMIN D3) 1000 UNITS CAPS Take 1 capsule by mouth every morning.   . pravastatin (PRAVACHOL) 80 MG tablet TAKE 1 TABLET ONCE DAILY FOR CHOLESTEROL  . warfarin (COUMADIN) 2.5 MG tablet TAKE 1 TABLET ONCE DAILY OR AS DIRECTED  . ZETIA 10 MG tablet TAKE 1 TABLET ONCE DAILY FOR CHOLESTEROL  . [DISCONTINUED] ZETIA 10 MG tablet TAKE 1 TABLET ONCE DAILY FOR CHOLESTEROL    Review of Systems  Constitutional: Negative.   HENT: Negative.   Eyes: Negative.   Respiratory: Negative.   Cardiovascular: Negative.   Gastrointestinal: Negative.   Endocrine: Negative.   Genitourinary: Negative.     Musculoskeletal: Negative.        Recent fall- bad sprain to right arm   Skin: Negative.   Allergic/Immunologic: Negative.   Neurological: Negative.   Hematological: Negative.   Psychiatric/Behavioral: Negative.        Objective:   Physical Exam  Nursing note and vitals reviewed. Constitutional: She is oriented to person, place, and time. She appears well-developed and well-nourished. No distress.  HENT:  Head: Normocephalic and atraumatic.  Left Ear: External ear normal.  Nose: Nose normal.  Mouth/Throat: Oropharynx is clear and moist.  Ear cerumen right ear canal  Eyes: Conjunctivae and EOM are normal. Pupils are equal, round, and reactive to light. Right eye exhibits no discharge. Left eye exhibits no discharge. No scleral icterus.  Neck: Normal range of motion. Neck supple. No thyromegaly present.  No carotid bruits  Cardiovascular: Normal rate, normal heart sounds and intact distal pulses.   No murmur heard. The rhythm is irregular irregular at about 60 per min  Pulmonary/Chest: Effort normal and breath sounds normal. No respiratory distress. She has no wheezes. She has no rales. She exhibits no tenderness.  The lungs are clear anteriorly and posteriorly  Abdominal: Soft. Bowel sounds are normal. She exhibits no mass. There is no tenderness. There is no rebound and no guarding.  Musculoskeletal: Normal range of motion. She exhibits no edema and  no tenderness.  The patient has a cast on her right forearm and wrist. There is no edema in her fingers   Lymphadenopathy:    She has no cervical adenopathy.  Neurological: She is alert and oriented to person, place, and time. She has normal reflexes. No cranial nerve deficit.  Skin: Skin is warm and dry. No rash noted.  Psychiatric: She has a normal mood and affect. Her behavior is normal. Judgment and thought content normal.    BP 154/91  Pulse 97  Temp(Src) 97.2 F (36.2 C) (Oral)  Ht 5' 1"  (1.549 m)  Wt 151 lb (68.493 kg)   BMI 28.55 kg/m2  Repeat blood pressure 120/80     Assessment & Plan:  1. Chronic atrial fibrillation - POCT CBC  2. HYPERLIPIDEMIA - POCT CBC - Lipid panel  3. HYPERTENSION - POCT CBC - BMP8+EGFR - Hepatic function panel  4. Metabolic syndrome - POCT CBC  5. Vitamin D deficiency - Vit D  25 hydroxy (rtn osteoporosis monitoring)  6. Nondisplaced fracture of right radius -Cast to be removed in about a week  Patient Instructions                       Medicare Annual Wellness Visit  [facility] and the medical providers at [facility] strive to bring you the best medical care.  In doing so we not only want to address your current medical conditions and concerns but also to detect new conditions early and prevent illness, disease and health-related problems.    Medicare offers a yearly Wellness Visit which allows our clinical staff to assess your need for preventative services including immunizations, lifestyle education, counseling to decrease risk of preventable diseases and screening for fall risk and other medical concerns.    This visit is provided free of charge (no copay) for all Medicare recipients. The clinical pharmacists at [facility] have begun to conduct these Wellness Visits which will also include a thorough review of all your medications.    As you primary medical provider recommend that you make an appointment for your Annual Wellness Visit if you have not done so already this year.  You may set up this appointment before you leave today or you may call back ([phone]) and schedule an appointment.  Please make sure when you call that you mention that you are scheduling your Annual Wellness Visit with the clinical pharmacist so that the appointment may be made for the proper length of time.     Continue current medications. Continue good therapeutic lifestyle changes which include good diet and exercise. Fall  precautions discussed with patient. If an FOBT was given today- please return it to our front desk. If you are over 24 years old - you may need Prevnar 38 or the adult Pneumonia vaccine. Move slowly  and continue to be careful to not put yourself at risk for falling Continue to drink plenty of fluids Continue to monitor blood pressure at home Watch sodium intake   Arrie Senate MD

## 2014-04-15 NOTE — Patient Instructions (Addendum)
Medicare Annual Wellness Visit  [place] and the medical providers at [facility] strive to bring you the best medical care.  In doing so we not only want to address your current medical conditions and concerns but also to detect new conditions early and prevent illness, disease and health-related problems.    Medicare offers a yearly Wellness Visit which allows our clinical staff to assess your need for preventative services including immunizations, lifestyle education, counseling to decrease risk of preventable diseases and screening for fall risk and other medical concerns.    This visit is provided free of charge (no copay) for all Medicare recipients. The clinical pharmacists at [facility] have begun to conduct these Wellness Visits which will also include a thorough review of all your medications.    As you primary medical provider recommend that you make an appointment for your Annual Wellness Visit if you have not done so already this year.  You may set up this appointment before you leave today or you may call back ([phone]) and schedule an appointment.  Please make sure when you call that you mention that you are scheduling your Annual Wellness Visit with the clinical pharmacist so that the appointment may be made for the proper length of time.     Continue current medications. Continue good therapeutic lifestyle changes which include good diet and exercise. Fall precautions discussed with patient. If an FOBT was given today- please return it to our front desk. If you are over 35 years old - you may need Prevnar 32 or the adult Pneumonia vaccine. Move slowly  and continue to be careful to not put yourself at risk for falling Continue to drink plenty of fluids Continue to monitor blood pressure at home Watch sodium intake

## 2014-04-16 ENCOUNTER — Ambulatory Visit: Payer: Medicare Other | Admitting: Family Medicine

## 2014-04-16 ENCOUNTER — Telehealth: Payer: Self-pay | Admitting: Family Medicine

## 2014-04-16 LAB — HEPATIC FUNCTION PANEL
ALT: 10 IU/L (ref 0–32)
AST: 22 IU/L (ref 0–40)
Albumin: 4.2 g/dL (ref 3.5–4.8)
Alkaline Phosphatase: 80 IU/L (ref 39–117)
BILIRUBIN TOTAL: 0.5 mg/dL (ref 0.0–1.2)
Bilirubin, Direct: 0.16 mg/dL (ref 0.00–0.40)
TOTAL PROTEIN: 7.4 g/dL (ref 6.0–8.5)

## 2014-04-16 LAB — BMP8+EGFR
BUN/Creatinine Ratio: 9 — ABNORMAL LOW (ref 11–26)
BUN: 6 mg/dL — AB (ref 8–27)
CALCIUM: 9.2 mg/dL (ref 8.7–10.3)
CO2: 26 mmol/L (ref 18–29)
CREATININE: 0.64 mg/dL (ref 0.57–1.00)
Chloride: 98 mmol/L (ref 97–108)
GFR calc Af Amer: 100 mL/min/{1.73_m2} (ref >59)
GFR, EST NON AFRICAN AMERICAN: 86 mL/min/{1.73_m2} (ref >59)
GLUCOSE: 98 mg/dL (ref 65–99)
Potassium: 3.5 mmol/L (ref 3.5–5.2)
SODIUM: 141 mmol/L (ref 134–144)

## 2014-04-16 LAB — LIPID PANEL
CHOL/HDL RATIO: 2.5 ratio (ref 0.0–4.4)
Cholesterol, Total: 119 mg/dL (ref 100–199)
HDL: 48 mg/dL (ref >39)
LDL Calculated: 54 mg/dL (ref 0–99)
Triglycerides: 87 mg/dL (ref 0–149)
VLDL Cholesterol Cal: 17 mg/dL (ref 5–40)

## 2014-04-16 LAB — VITAMIN D 25 HYDROXY (VIT D DEFICIENCY, FRACTURES): Vit D, 25-Hydroxy: 28 ng/mL — ABNORMAL LOW (ref 30.0–100.0)

## 2014-04-16 NOTE — Telephone Encounter (Signed)
Message copied by Waverly Ferrari on Fri Apr 16, 2014  3:34 PM ------      Message from: Chipper Herb      Created: Fri Apr 16, 2014  1:31 PM       The blood sugar is 98. The creatinine, the most important kidney function test is within normal limits. The electrolytes including potassium are within normal limits.      All liver function tests are within normal limit      The vitamin D level is 28.0 this is still below the normal range. Have the patient increase her vitamin D3 to 2000  Daily      8 traditional lipid panel has triglycerides of 87. The LDL C. is 54. This is good. Continue current cholesterol medication and as aggressive therapeutic lifestyle changes as possible ------

## 2014-04-22 ENCOUNTER — Encounter: Payer: Self-pay | Admitting: Family Medicine

## 2014-05-03 ENCOUNTER — Ambulatory Visit (INDEPENDENT_AMBULATORY_CARE_PROVIDER_SITE_OTHER): Payer: Medicare Other | Admitting: Nurse Practitioner

## 2014-05-03 ENCOUNTER — Ambulatory Visit (INDEPENDENT_AMBULATORY_CARE_PROVIDER_SITE_OTHER): Payer: Medicare Other

## 2014-05-03 ENCOUNTER — Encounter: Payer: Self-pay | Admitting: Nurse Practitioner

## 2014-05-03 VITALS — BP 157/90 | HR 101 | Temp 97.0°F | Ht 61.0 in | Wt 151.0 lb

## 2014-05-03 DIAGNOSIS — S42301D Unspecified fracture of shaft of humerus, right arm, subsequent encounter for fracture with routine healing: Secondary | ICD-10-CM

## 2014-05-03 DIAGNOSIS — S42309D Unspecified fracture of shaft of humerus, unspecified arm, subsequent encounter for fracture with routine healing: Secondary | ICD-10-CM

## 2014-05-03 DIAGNOSIS — S5290XA Unspecified fracture of unspecified forearm, initial encounter for closed fracture: Secondary | ICD-10-CM

## 2014-05-03 DIAGNOSIS — I4891 Unspecified atrial fibrillation: Secondary | ICD-10-CM | POA: Diagnosis not present

## 2014-05-03 DIAGNOSIS — Z4789 Encounter for other orthopedic aftercare: Secondary | ICD-10-CM | POA: Diagnosis not present

## 2014-05-03 DIAGNOSIS — Z4689 Encounter for fitting and adjustment of other specified devices: Secondary | ICD-10-CM

## 2014-05-03 LAB — POCT INR: INR: 3.1

## 2014-05-03 NOTE — Progress Notes (Signed)
   Subjective:    Patient ID: Kari [name], female    DOB: August 17, 1934, 80 y.o.   MRN: [id number]  HPI Patient was seen 6 weeks ago with a right distal radius fracture- SHe is here to day to see if ready to take cast off. She has no c/o pain     Review of Systems  Constitutional: Negative.   HENT: Negative.   Respiratory: Negative.   Cardiovascular: Negative.   Genitourinary: Negative.   Psychiatric/Behavioral: Negative.   All other systems reviewed and are negative.      Objective:   Physical Exam  Constitutional: She is oriented to person, place, and time. She appears well-developed and well-nourished.  Cardiovascular: Normal rate, regular rhythm and normal heart sounds.   Pulmonary/Chest: Effort normal and breath sounds normal.  Neurological: She is alert and oriented to person, place, and time.  Skin: Skin is warm and dry.  Psychiatric: She has a normal mood and affect. Her behavior is normal. Judgment and thought content normal.   BP 157/90  Pulse 101  Temp(Src) 97 F (36.1 C) (Oral)  Ht 5\' 1"  (1.549 m)  Wt 151 lb (68.493 kg)  BMI 28.55 kg/m2  Right forearm x ray- healing right distal radius fracture-Preliminary reading by Ronnald Collum, FNP  [facility]       Assessment & Plan:   1. Arm fracture, right, with routine healing, subsequent encounter   2. Orthopedic cast removal    Be careful Fall prevention RTO prn  Mary-Margaret Hassell Done, FNP

## 2014-05-03 NOTE — Addendum Note (Signed)
Addended by: Selmer Dominion on: 05/03/2014 02:41 PM   Modules accepted: Orders

## 2014-05-03 NOTE — Patient Instructions (Addendum)
Fall Prevention and Home Safety Falls cause injuries and can affect all age groups. It is possible to use preventive measures to significantly decrease the likelihood of falls. There are many simple measures which can make your home safer and prevent falls. OUTDOORS  Repair cracks and edges of walkways and driveways.  Remove high doorway thresholds.  Trim shrubbery on the main path into your home.  Have good outside lighting.  Clear walkways of tools, rocks, debris, and clutter.  Check that handrails are not broken and are securely fastened. Both sides of steps should have handrails.  Have leaves, snow, and ice cleared regularly.  Use sand or salt on walkways during winter months.  In the garage, clean up grease or oil spills. BATHROOM  Install night lights.  Install grab bars by the toilet and in the tub and shower.  Use non-skid mats or decals in the tub or shower.  Place a plastic non-slip stool in the shower to sit on, if needed.  Keep floors dry and clean up all water on the floor immediately.  Remove soap buildup in the tub or shower on a regular basis.  Secure bath mats with non-slip, double-sided rug tape.  Remove throw rugs and tripping hazards from the floors. BEDROOMS  Install night lights.  Make sure a bedside light is easy to reach.  Do not use oversized bedding.  Keep a telephone by your bedside.  Have a firm chair with side arms to use for getting dressed.  Remove throw rugs and tripping hazards from the floor. KITCHEN  Keep handles on pots and pans turned toward the center of the stove. Use back burners when possible.  Clean up spills quickly and allow time for drying.  Avoid walking on wet floors.  Avoid hot utensils and knives.  Position shelves so they are not too high or low.  Place commonly used objects within easy reach.  If necessary, use a sturdy step stool with a grab bar when reaching.  Keep electrical cables out of the  way.  Do not use floor polish or wax that makes floors slippery. If you must use wax, use non-skid floor wax.  Remove throw rugs and tripping hazards from the floor. STAIRWAYS  Never leave objects on stairs.  Place handrails on both sides of stairways and use them. Fix any loose handrails. Make sure handrails on both sides of the stairways are as long as the stairs.  Check carpeting to make sure it is firmly attached along stairs. Make repairs to worn or loose carpet promptly.  Avoid placing throw rugs at the top or bottom of stairways, or properly secure the rug with carpet tape to prevent slippage. Get rid of throw rugs, if possible.  Have an electrician put in a light switch at the top and bottom of the stairs. OTHER FALL PREVENTION TIPS  Wear low-heel or rubber-soled shoes that are supportive and fit well. Wear closed toe shoes.  When using a stepladder, make sure it is fully opened and both spreaders are firmly locked. Do not climb a closed stepladder.  Add color or contrast paint or tape to grab bars and handrails in your home. Place contrasting color strips on first and last steps.  Learn and use mobility aids as needed. Install an electrical emergency response system.  Turn on lights to avoid dark areas. Replace light bulbs that burn out immediately. Get light switches that glow.  Arrange furniture to create clear pathways. Keep furniture in the same place.    Firmly attach carpet with non-skid or double-sided tape.  Eliminate uneven floor surfaces.  Select a carpet pattern that does not visually hide the edge of steps.  Be aware of all pets. OTHER HOME SAFETY TIPS  Set the water temperature for 120 F (48.8 C).  Keep emergency numbers on or near the telephone.  Keep smoke detectors on every level of the home and near sleeping areas. Document Released: 08/10/2002 Document Revised: 02/19/2012 Document Reviewed: 11/09/2011 [facility] Patient Information 2015  [place], [place]. This information is not intended to replace advice given to you by your health care provider. Make sure you discuss any questions you have with your health care provider.   Anticoagulation Dose Instructions as of 05/03/2014     Dorene Grebe Tue Wed Thu Fri Sat   New Dose 2.5 mg 1.25 mg 2.5 mg 2.5 mg 2.5 mg 1.25 mg 2.5 mg    Description       No warfarin tomorrow - Tuesday, September 1st.  Then continue current warfarin dose of 1/2 tablet on Mondays and Fridays and 1 tablet all other days.      INR was 3.1 today

## 2014-05-05 ENCOUNTER — Other Ambulatory Visit: Payer: Self-pay | Admitting: Nurse Practitioner

## 2014-05-05 ENCOUNTER — Telehealth: Payer: Self-pay

## 2014-05-05 NOTE — Telephone Encounter (Signed)
[facility] to x-ray 

## 2014-05-06 NOTE — Telephone Encounter (Signed)
Pt aware of x-ray results. Prescription written for wrist splint and patient told to rtc in 2-3 weeks for x-ray recheck. She will not need to be seen by Shelah Lewandowsky at that time.

## 2014-05-17 ENCOUNTER — Other Ambulatory Visit: Payer: Self-pay | Admitting: Family Medicine

## 2014-05-20 ENCOUNTER — Other Ambulatory Visit: Payer: Self-pay | Admitting: Nurse Practitioner

## 2014-05-20 ENCOUNTER — Ambulatory Visit (INDEPENDENT_AMBULATORY_CARE_PROVIDER_SITE_OTHER): Payer: Medicare Other

## 2014-05-20 ENCOUNTER — Ambulatory Visit (INDEPENDENT_AMBULATORY_CARE_PROVIDER_SITE_OTHER): Payer: Medicare Other | Admitting: Pharmacist

## 2014-05-20 DIAGNOSIS — I4891 Unspecified atrial fibrillation: Secondary | ICD-10-CM

## 2014-05-20 DIAGNOSIS — S62101G Fracture of unspecified carpal bone, right wrist, subsequent encounter for fracture with delayed healing: Secondary | ICD-10-CM

## 2014-05-20 DIAGNOSIS — S52502G Unspecified fracture of the lower end of left radius, subsequent encounter for closed fracture with delayed healing: Secondary | ICD-10-CM

## 2014-05-20 DIAGNOSIS — IMO0001 Reserved for inherently not codable concepts without codable children: Secondary | ICD-10-CM | POA: Diagnosis not present

## 2014-05-20 LAB — POCT INR: INR: 1.8

## 2014-05-20 NOTE — Patient Instructions (Signed)
Anticoagulation Dose Instructions as of 05/20/2014     Kari [name] Tue Wed Thu Fri Sat   New Dose 2.5 mg 1.25 mg 2.5 mg 2.5 mg 2.5 mg 1.25 mg 2.5 mg    Description       Take 1 and 1/2 tablets today - Thursday, September 17th.  Then continue current warfarin dose of 1/2 tablet on Mondays and Fridays and 1 tablet all other days.     INR was 1.8 today

## 2014-05-27 DIAGNOSIS — S5290XD Unspecified fracture of unspecified forearm, subsequent encounter for closed fracture with routine healing: Secondary | ICD-10-CM | POA: Diagnosis not present

## 2014-06-08 DIAGNOSIS — H43813 Vitreous degeneration, bilateral: Secondary | ICD-10-CM | POA: Diagnosis not present

## 2014-06-09 ENCOUNTER — Other Ambulatory Visit: Payer: Self-pay | Admitting: Nurse Practitioner

## 2014-06-21 ENCOUNTER — Ambulatory Visit (INDEPENDENT_AMBULATORY_CARE_PROVIDER_SITE_OTHER): Payer: Medicare Other | Admitting: Pharmacist

## 2014-06-21 DIAGNOSIS — Z23 Encounter for immunization: Secondary | ICD-10-CM

## 2014-06-21 DIAGNOSIS — I4891 Unspecified atrial fibrillation: Secondary | ICD-10-CM

## 2014-06-21 LAB — POCT INR: INR: 2.7

## 2014-06-21 NOTE — Patient Instructions (Signed)
Anticoagulation Dose Instructions as of 06/21/2014     Kari [name] Tue Wed Thu Fri Sat   New Dose 2.5 mg 1.25 mg 2.5 mg 2.5 mg 2.5 mg 1.25 mg 2.5 mg    Description       Continue current warfarin dose of 1/2 tablet on Mondays and Fridays and 1 tablet all other days.      INR was 2.7 today

## 2014-06-29 DIAGNOSIS — I70209 Unspecified atherosclerosis of native arteries of extremities, unspecified extremity: Secondary | ICD-10-CM | POA: Diagnosis not present

## 2014-06-29 DIAGNOSIS — L84 Corns and callosities: Secondary | ICD-10-CM | POA: Diagnosis not present

## 2014-06-29 DIAGNOSIS — B351 Tinea unguium: Secondary | ICD-10-CM | POA: Diagnosis not present

## 2014-07-22 ENCOUNTER — Other Ambulatory Visit: Payer: Self-pay | Admitting: Family Medicine

## 2014-07-26 ENCOUNTER — Ambulatory Visit (INDEPENDENT_AMBULATORY_CARE_PROVIDER_SITE_OTHER): Payer: Medicare Other | Admitting: Pharmacist

## 2014-07-26 DIAGNOSIS — I4891 Unspecified atrial fibrillation: Secondary | ICD-10-CM | POA: Diagnosis not present

## 2014-07-26 DIAGNOSIS — I482 Chronic atrial fibrillation, unspecified: Secondary | ICD-10-CM

## 2014-07-26 LAB — POCT INR: INR: 2.9

## 2014-07-26 NOTE — Patient Instructions (Signed)
Anticoagulation Dose Instructions as of 07/26/2014      Dorene Grebe Tue Wed Thu Fri Sat   New Dose 2.5 mg 1.25 mg 2.5 mg 2.5 mg 2.5 mg 1.25 mg 2.5 mg    Description        Continue current warfarin dose 2.5mg  tablet - take 1/2 tablet on Mondays and Fridays and 1 tablet all other days.      INR was 2.9 today

## 2014-08-17 ENCOUNTER — Ambulatory Visit (INDEPENDENT_AMBULATORY_CARE_PROVIDER_SITE_OTHER): Payer: Medicare Other | Admitting: Family Medicine

## 2014-08-17 ENCOUNTER — Encounter: Payer: Self-pay | Admitting: Family Medicine

## 2014-08-17 VITALS — BP 146/84 | HR 73 | Temp 97.6°F | Ht 61.0 in | Wt 158.0 lb

## 2014-08-17 DIAGNOSIS — E559 Vitamin D deficiency, unspecified: Secondary | ICD-10-CM | POA: Diagnosis not present

## 2014-08-17 DIAGNOSIS — E785 Hyperlipidemia, unspecified: Secondary | ICD-10-CM

## 2014-08-17 DIAGNOSIS — I482 Chronic atrial fibrillation, unspecified: Secondary | ICD-10-CM

## 2014-08-17 DIAGNOSIS — I4819 Other persistent atrial fibrillation: Secondary | ICD-10-CM

## 2014-08-17 DIAGNOSIS — I10 Essential (primary) hypertension: Secondary | ICD-10-CM

## 2014-08-17 DIAGNOSIS — I481 Persistent atrial fibrillation: Secondary | ICD-10-CM | POA: Diagnosis not present

## 2014-08-17 LAB — POCT CBC
GRANULOCYTE PERCENT: 70.5 % (ref 37–80)
HEMATOCRIT: 38.7 % (ref 37.7–47.9)
HEMOGLOBIN: 12.2 g/dL (ref 12.2–16.2)
Lymph, poc: 1.5 (ref 0.6–3.4)
MCH, POC: 25.1 pg — AB (ref 27–31.2)
MCHC: 31.4 g/dL — AB (ref 31.8–35.4)
MCV: 79.9 fL — AB (ref 80–97)
MPV: 8.2 fL (ref 0–99.8)
POC GRANULOCYTE: 4.6 (ref 2–6.9)
POC LYMPH PERCENT: 23.7 %L (ref 10–50)
Platelet Count, POC: 217 10*3/uL (ref 142–424)
RBC: 4.8 M/uL (ref 4.04–5.48)
RDW, POC: 17.1 %
WBC: 6.5 10*3/uL (ref 4.6–10.2)

## 2014-08-17 LAB — POCT INR: INR: 2.1

## 2014-08-17 NOTE — Progress Notes (Signed)
Subjective:    Patient ID: Kari [name], female    DOB: [birth year], 80 y.o.   MRN: [id number]  HPI Pt here for follow up and management of chronic medical problems. As usual, the patient has no complaints. She refuses to do a Pap smear. She is going to get her mammogram soon in [place]. The patient has a history of atrial fibrillation and her pro time today was good.       Patient Active Problem List   Diagnosis Date Noted  . Metabolic syndrome 34/19/6222  . Osteoporosis 03/11/2013  . A-fib 11/24/2012  . Hypokalemia 05/04/2012  . Atypical chest pain 05/04/2012  . Obesity   . Symptomatic menopausal or female climacteric states   . Kyphosis   . Gallstones   . Personal history of adenomatous colonic polyps   . HYPERLIPIDEMIA 03/29/2009  . HYPERTENSION 03/29/2009   Outpatient Encounter Prescriptions as of 08/17/2014  Medication Sig  . atenolol (TENORMIN) 50 MG tablet TAKE (1) TABLET TWICE A DAY.  . captopril (CAPOTEN) 50 MG tablet TAKE (1) TABLET TWICE A DAY FOR HIGH BLOOD PRESSURE.  Marland Kitchen Cholecalciferol (VITAMIN D3) 1000 UNITS CAPS Take 1 capsule by mouth every morning.   . pravastatin (PRAVACHOL) 80 MG tablet TAKE 1 TABLET ONCE DAILY FOR CHOLESTEROL  . warfarin (COUMADIN) 2.5 MG tablet TAKE 1 TABLET ONCE DAILY OR AS DIRECTED  . ZETIA 10 MG tablet TAKE 1 TABLET ONCE DAILY FOR CHOLESTEROL  . [DISCONTINUED] alendronate (FOSAMAX) 70 MG tablet Take 1 tablet (70 mg total) by mouth every 7 (seven) days. Take with a full glass of water on an empty stomach.    Review of Systems  Constitutional: Negative.   HENT: Negative.   Eyes: Negative.   Respiratory: Negative.   Cardiovascular: Negative.   Gastrointestinal: Negative.   Endocrine: Negative.   Genitourinary: Negative.   Musculoskeletal: Negative.   Skin: Negative.   Allergic/Immunologic: Negative.   Neurological: Negative.   Hematological: Negative.   Psychiatric/Behavioral: Negative.        Objective:   Physical  Exam  Constitutional: She is oriented to person, place, and time. She appears well-developed and well-nourished. No distress.  The patient is pleasant, alert, and cooperative.  HENT:  Head: Normocephalic and atraumatic.  Right Ear: External ear normal.  Left Ear: External ear normal.  Nose: Nose normal.  Mouth/Throat: Oropharynx is clear and moist. No oropharyngeal exudate.  Eyes: Conjunctivae and EOM are normal. Pupils are equal, round, and reactive to light. Right eye exhibits no discharge. Left eye exhibits no discharge. No scleral icterus.  Neck: Normal range of motion. Neck supple. No thyromegaly present.  The neck has no bruits, thyromegaly, or adenopathy.  Cardiovascular: Normal rate, normal heart sounds and intact distal pulses.   No murmur heard. The rhythm is irregular irregular at 72/m  Pulmonary/Chest: Effort normal and breath sounds normal. No respiratory distress. She has no wheezes. She has no rales. She exhibits no tenderness.  Abdominal: Soft. Bowel sounds are normal. She exhibits no mass. There is no tenderness. There is no rebound and no guarding.  Obesity present  Genitourinary:  The breast exam and axillary exam today were negative for lumps or masses.  Musculoskeletal: Normal range of motion. She exhibits no edema or tenderness.  Lymphadenopathy:    She has no cervical adenopathy.  Neurological: She is alert and oriented to person, place, and time. She has normal reflexes. No cranial nerve deficit.  Skin: Skin is warm and dry. No rash noted.  Psychiatric: She has a normal mood and affect. Her behavior is normal. Judgment and thought content normal.  Nursing note and vitals reviewed.  BP 146/84 mmHg  Pulse 73  Temp(Src) 97.6 F (36.4 C) (Oral)  Ht 5' 1" (1.549 m)  Wt 158 lb (71.668 kg)  BMI 29.87 kg/m2        Assessment & Plan:  1. Chronic atrial fibrillation - POCT CBC  2. Vitamin D deficiency - POCT CBC - Vit D  25 hydroxy (rtn osteoporosis  monitoring)  3. Hyperlipemia - POCT CBC - NMR, lipoprofile  4. Essential hypertension - POCT CBC - BMP8+EGFR - Hepatic function panel  5. Persistent atrial fibrillation - POCT INR -Continue Coumadin as doing  Patient Instructions                       Medicare Annual Wellness Visit  [facility] and the medical providers at [facility] strive to bring you the best medical care.  In doing so we not only want to address your current medical conditions and concerns but also to detect new conditions early and prevent illness, disease and health-related problems.    Medicare offers a yearly Wellness Visit which allows our clinical staff to assess your need for preventative services including immunizations, lifestyle education, counseling to decrease risk of preventable diseases and screening for fall risk and other medical concerns.    This visit is provided free of charge (no copay) for all Medicare recipients. The clinical pharmacists at [facility] have begun to conduct these Wellness Visits which will also include a thorough review of all your medications.    As you primary medical provider recommend that you make an appointment for your Annual Wellness Visit if you have not done so already this year.  You may set up this appointment before you leave today or you may call back ([phone]) and schedule an appointment.  Please make sure when you call that you mention that you are scheduling your Annual Wellness Visit with the clinical pharmacist so that the appointment may be made for the proper length of time.     Continue current medications. Continue good therapeutic lifestyle changes which include good diet and exercise. Fall precautions discussed with patient. If an FOBT was given today- please return it to our front desk. If you are over 50 years old - you may need Prevnar 13 or the adult Pneumonia vaccine.  Flu Shots will be available  at our office starting mid- September. Please call and schedule a FLU CLINIC APPOINTMENT.   Your pro time was good today, continue her Coumadin as you are doing and follow-up with the clinical pharmacists as planned This winter drink plenty of fluids Get your mammogram as planned We will call you with your lab work results as soon as those results are available   Don W. Moore MD   

## 2014-08-17 NOTE — Patient Instructions (Addendum)
Medicare Annual Wellness Visit  [facility] and the medical providers at [facility] strive to bring you the best medical care.  In doing so we not only want to address your current medical conditions and concerns but also to detect new conditions early and prevent illness, disease and health-related problems.    Medicare offers a yearly Wellness Visit which allows our clinical staff to assess your need for preventative services including immunizations, lifestyle education, counseling to decrease risk of preventable diseases and screening for fall risk and other medical concerns.    This visit is provided free of charge (no copay) for all Medicare recipients. The clinical pharmacists at [facility] have begun to conduct these Wellness Visits which will also include a thorough review of all your medications.    As you primary medical provider recommend that you make an appointment for your Annual Wellness Visit if you have not done so already this year.  You may set up this appointment before you leave today or you may call back ([phone]) and schedule an appointment.  Please make sure when you call that you mention that you are scheduling your Annual Wellness Visit with the clinical pharmacist so that the appointment may be made for the proper length of time.     Continue current medications. Continue good therapeutic lifestyle changes which include good diet and exercise. Fall precautions discussed with patient. If an FOBT was given today- please return it to our front desk. If you are over 23 years old - you may need Prevnar 18 or the adult Pneumonia vaccine.  Flu Shots will be available at our office starting mid- September. Please call and schedule a FLU CLINIC APPOINTMENT.   Your pro time was good today, continue her Coumadin as you are doing and follow-up with the clinical pharmacists as planned This winter drink plenty of  fluids Get your mammogram as planned We will call you with your lab work results as soon as those results are available

## 2014-08-18 LAB — VITAMIN D 25 HYDROXY (VIT D DEFICIENCY, FRACTURES): Vit D, 25-Hydroxy: 26.4 ng/mL — ABNORMAL LOW (ref 30.0–100.0)

## 2014-08-18 LAB — NMR, LIPOPROFILE
CHOLESTEROL: 123 mg/dL (ref 100–199)
HDL Cholesterol by NMR: 43 mg/dL (ref >39)
HDL Particle Number: 30.1 umol/L — ABNORMAL LOW (ref >=30.5)
LDL Particle Number: 766 nmol/L (ref <1000)
LDL Size: 21 nm (ref >20.5)
LDL-C: 56 mg/dL (ref 0–99)
LP-IR SCORE: 50 — AB (ref <=45)
Small LDL Particle Number: 312 nmol/L (ref <=527)
Triglycerides by NMR: 118 mg/dL (ref 0–149)

## 2014-08-18 LAB — BMP8+EGFR
BUN/Creatinine Ratio: 7 — ABNORMAL LOW (ref 11–26)
BUN: 5 mg/dL — AB (ref 8–27)
CHLORIDE: 100 mmol/L (ref 97–108)
CO2: 28 mmol/L (ref 18–29)
Calcium: 9.4 mg/dL (ref 8.7–10.3)
Creatinine, Ser: 0.67 mg/dL (ref 0.57–1.00)
GFR, EST AFRICAN AMERICAN: 97 mL/min/{1.73_m2} (ref >59)
GFR, EST NON AFRICAN AMERICAN: 84 mL/min/{1.73_m2} (ref >59)
GLUCOSE: 101 mg/dL — AB (ref 65–99)
Potassium: 3.5 mmol/L (ref 3.5–5.2)
Sodium: 140 mmol/L (ref 134–144)

## 2014-08-18 LAB — HEPATIC FUNCTION PANEL
ALK PHOS: 76 IU/L (ref 39–117)
ALT: 6 IU/L (ref 0–32)
AST: 17 IU/L (ref 0–40)
Albumin: 4 g/dL (ref 3.5–4.8)
Bilirubin, Direct: 0.15 mg/dL (ref 0.00–0.40)
Total Bilirubin: 0.5 mg/dL (ref 0.0–1.2)
Total Protein: 7.1 g/dL (ref 6.0–8.5)

## 2014-09-13 ENCOUNTER — Other Ambulatory Visit: Payer: Self-pay | Admitting: Nurse Practitioner

## 2014-09-13 ENCOUNTER — Other Ambulatory Visit: Payer: Self-pay | Admitting: Family Medicine

## 2014-09-19 DIAGNOSIS — H2512 Age-related nuclear cataract, left eye: Secondary | ICD-10-CM | POA: Diagnosis not present

## 2014-09-23 ENCOUNTER — Ambulatory Visit (INDEPENDENT_AMBULATORY_CARE_PROVIDER_SITE_OTHER): Payer: Medicare Other | Admitting: Pharmacist

## 2014-09-23 DIAGNOSIS — I4891 Unspecified atrial fibrillation: Secondary | ICD-10-CM

## 2014-09-23 DIAGNOSIS — I4819 Other persistent atrial fibrillation: Secondary | ICD-10-CM

## 2014-09-23 DIAGNOSIS — I481 Persistent atrial fibrillation: Secondary | ICD-10-CM

## 2014-09-23 LAB — POCT INR: INR: 2.4

## 2014-09-23 NOTE — Patient Instructions (Signed)
Anticoagulation Dose Instructions as of 09/23/2014      Kari [name] Tue Wed Thu Fri Sat   New Dose 2.5 mg 1.25 mg 2.5 mg 2.5 mg 2.5 mg 1.25 mg 2.5 mg    Description        Continue current warfarin dose 2.5mg  tablet - take 1/2 tablet on Mondays and Fridays and 1 tablet all other days.      INR was 2.4 today

## 2014-10-05 ENCOUNTER — Ambulatory Visit (INDEPENDENT_AMBULATORY_CARE_PROVIDER_SITE_OTHER): Payer: Medicare Other | Admitting: Family Medicine

## 2014-10-05 ENCOUNTER — Encounter: Payer: Self-pay | Admitting: Family Medicine

## 2014-10-05 ENCOUNTER — Ambulatory Visit (INDEPENDENT_AMBULATORY_CARE_PROVIDER_SITE_OTHER): Payer: Medicare Other

## 2014-10-05 VITALS — BP 153/83 | HR 84 | Temp 97.4°F | Ht 61.0 in | Wt 157.0 lb

## 2014-10-05 DIAGNOSIS — M25562 Pain in left knee: Secondary | ICD-10-CM

## 2014-10-05 NOTE — Progress Notes (Signed)
   Subjective:    Patient ID: Kari [name], female    DOB: 03-04-1934, 81 y.o.   MRN: [id number]  HPI C/o left knee pain and discomfort and she states she has had no injury.  Review of Systems  Constitutional: Negative for fever.  HENT: Negative for ear pain.   Eyes: Negative for discharge.  Respiratory: Negative for cough.   Cardiovascular: Negative for chest pain.  Gastrointestinal: Negative for abdominal distention.  Endocrine: Negative for polyuria.  Genitourinary: Negative for difficulty urinating.  Musculoskeletal: Negative for gait problem and neck pain.  Skin: Negative for color change and rash.  Neurological: Negative for speech difficulty and headaches.  Psychiatric/Behavioral: Negative for agitation.       Objective:    BP 153/83 mmHg  Pulse 84  Temp(Src) 97.4 F (36.3 C) (Oral)  Ht 5\' 1"  (1.549 m)  Wt 157 lb (71.215 kg)  BMI 29.68 kg/m2 Physical Exam  +TTP left knee Neg drawer Neg Varus or Valgus strain No swelling  Xray of left knee - Arthritis Prelimnary reading by Iverson Alamin  Procedure - Left Knee prepped with ETOH and then one cc of kenalog, 1 cc of marcaine, 1 cc of lidocaine injected into left knee space w/o difficulty and patient tolerated well.    Assessment & Plan:     ICD-9-CM ICD-10-CM   1. Left knee pain 719.46 M25.562 DG Knee 1-2 Views Left  Left knee injection   No Follow-up on file.  Lysbeth Penner FNP

## 2014-10-06 ENCOUNTER — Encounter: Payer: Self-pay | Admitting: Family Medicine

## 2014-10-06 ENCOUNTER — Ambulatory Visit (INDEPENDENT_AMBULATORY_CARE_PROVIDER_SITE_OTHER): Payer: Medicare Other | Admitting: Family Medicine

## 2014-10-06 VITALS — BP 151/82 | HR 98 | Temp 97.4°F | Ht 61.0 in | Wt 156.6 lb

## 2014-10-06 DIAGNOSIS — M25562 Pain in left knee: Secondary | ICD-10-CM | POA: Diagnosis not present

## 2014-10-06 MED ORDER — HYDROCODONE-ACETAMINOPHEN 5-325 MG PO TABS: 1.0000 | ORAL_TABLET | Freq: Four times a day (QID) | ORAL | Status: DC | PRN

## 2014-10-06 NOTE — Progress Notes (Signed)
   Subjective:    Patient ID: Kari [name], female    DOB: 02/14/1934, 81 y.o.   MRN: [id number]  HPI Patient is here with c/o left knee pain and discomfort  Review of Systems  Constitutional: Negative for fever.  HENT: Negative for ear pain.   Eyes: Negative for discharge.  Respiratory: Negative for cough.   Cardiovascular: Negative for chest pain.  Gastrointestinal: Negative for abdominal distention.  Endocrine: Negative for polyuria.  Genitourinary: Negative for difficulty urinating.  Musculoskeletal: Negative for gait problem and neck pain.  Skin: Negative for color change and rash.  Neurological: Negative for speech difficulty and headaches.  Psychiatric/Behavioral: Negative for agitation.       Objective:    BP 151/82 mmHg  Pulse 98  Temp(Src) 97.4 F (36.3 C) (Oral)  Ht 5\' 1"  (1.549 m)  Wt 156 lb 9.6 oz (71.033 kg)  BMI 29.60 kg/m2 Physical Exam  Constitutional: She is oriented to person, place, and time. She appears well-developed and well-nourished.  HENT:  Head: Normocephalic and atraumatic.  Mouth/Throat: Oropharynx is clear and moist.  Eyes: Pupils are equal, round, and reactive to light.  Neck: Normal range of motion. Neck supple.  Cardiovascular: Normal rate and regular rhythm.   No murmur heard. Pulmonary/Chest: Effort normal and breath sounds normal.  Abdominal: Soft. Bowel sounds are normal. There is no tenderness.  Neurological: She is alert and oriented to person, place, and time.  Skin: Skin is warm and dry.  Psychiatric: She has a normal mood and affect.          Assessment & Plan:     ICD-9-CM ICD-10-CM   1. Knee pain, acute, left 719.46 M25.562 HYDROcodone-acetaminophen (NORCO) 5-325 MG per tablet     No Follow-up on file.  Lysbeth Penner FNP

## 2014-10-11 ENCOUNTER — Encounter: Payer: Self-pay | Admitting: Family Medicine

## 2014-10-11 ENCOUNTER — Ambulatory Visit (INDEPENDENT_AMBULATORY_CARE_PROVIDER_SITE_OTHER): Payer: Medicare Other | Admitting: Family Medicine

## 2014-10-11 VITALS — BP 158/86 | HR 88 | Temp 97.3°F | Ht 61.0 in | Wt 156.0 lb

## 2014-10-11 DIAGNOSIS — M25562 Pain in left knee: Secondary | ICD-10-CM

## 2014-10-11 DIAGNOSIS — M1712 Unilateral primary osteoarthritis, left knee: Secondary | ICD-10-CM

## 2014-10-11 DIAGNOSIS — I482 Chronic atrial fibrillation, unspecified: Secondary | ICD-10-CM

## 2014-10-11 NOTE — Patient Instructions (Signed)
Increased pain medicine by taking an extra half and one during the day and taking 2 at night time if necessary. If this does not help control the pain please call us back sooner. We will arrange for you to see the orthopedic surgeon that comes to the office in a couple of weeks We will also go ahead and arrange to get an MRI of the knee to give [place] more detail about what could be going on.

## 2014-10-11 NOTE — Addendum Note (Signed)
Addended by: Zannie Cove on: 10/11/2014 04:31 PM   Modules accepted: Orders

## 2014-10-11 NOTE — Progress Notes (Signed)
Subjective:    Patient ID: Kari [name], female    DOB: 25-Apr-1934, 81 y.o.   MRN: [id number]  HPI Patient here today for her 3rd office visit regarding her left knee pain. She is not doing any better and per the pt her pain medication that was given does not help much at all. She has been taking hydrocodone 5/325 4 times daily and this is just not helping her pain. She denies any injury to this knee. She indicates she has not had problems with that before this most recent time frame.        Patient Active Problem List   Diagnosis Date Noted  . Metabolic syndrome 44/09/270  . Osteoporosis 03/11/2013  . A-fib 11/24/2012  . Hypokalemia 05/04/2012  . Atypical chest pain 05/04/2012  . Obesity   . Symptomatic menopausal or female climacteric states   . Kyphosis   . Gallstones   . Personal history of adenomatous colonic polyps   . HYPERLIPIDEMIA 03/29/2009  . HYPERTENSION 03/29/2009   Outpatient Encounter Prescriptions as of 10/11/2014  Medication Sig  . amLODipine-valsartan (EXFORGE) 5-320 MG per tablet TAKE (1/2) TABLET DAILY.  Marland Kitchen atenolol (TENORMIN) 50 MG tablet TAKE (1) TABLET TWICE A DAY.  . captopril (CAPOTEN) 50 MG tablet TAKE (1) TABLET TWICE A DAY FOR HIGH BLOOD PRESSURE.  Marland Kitchen Cholecalciferol (VITAMIN D3) 1000 UNITS CAPS Take 1 capsule by mouth every morning.   Marland Kitchen HYDROcodone-acetaminophen (NORCO) 5-325 MG per tablet Take 1 tablet by mouth every 6 (six) hours as needed for moderate pain.  . pravastatin (PRAVACHOL) 80 MG tablet TAKE 1 TABLET ONCE DAILY FOR CHOLESTEROL  . warfarin (COUMADIN) 2.5 MG tablet TAKE 1 TABLET ONCE DAILY OR AS DIRECTED  . ZETIA 10 MG tablet TAKE 1 TABLET ONCE DAILY FOR CHOLESTEROL    Review of Systems  Constitutional: Negative.   HENT: Negative.   Eyes: Negative.   Respiratory: Negative.   Cardiovascular: Negative.   Gastrointestinal: Negative.   Endocrine: Negative.   Genitourinary: Negative.   Musculoskeletal: Positive for arthralgias (left  knee pain).  Skin: Negative.   Allergic/Immunologic: Negative.   Neurological: Negative.   Hematological: Negative.   Psychiatric/Behavioral: Negative.        Objective:   Physical Exam  Constitutional: She is oriented to person, place, and time. She appears well-developed and well-nourished. No distress.  HENT:  Head: Normocephalic.  Eyes: EOM are normal. Pupils are equal, round, and reactive to light. Left eye exhibits no discharge. No scleral icterus.  Musculoskeletal: Normal range of motion. She exhibits no edema or tenderness.  The pain in the left knee is it the bilateral joint line of the knee. On exam of the knee the patient had good flexion and extension and internal and external rotation and no joint line tenderness.  Neurological: She is alert and oriented to person, place, and time.  Skin: Skin is warm and dry. No rash noted.  Psychiatric: She has a normal mood and affect. Her behavior is normal. Judgment and thought content normal.  Nursing note and vitals reviewed.  BP 158/86 mmHg  Pulse 88  Temp(Src) 97.3 F (36.3 C) (Oral)  Ht 5\' 1"  (1.549 m)  Wt 156 lb (70.761 kg)  BMI 29.49 kg/m2        Assessment & Plan:  1. Primary osteoarthritis of left knee -X-ray done previously shows joint space no airway  2. Left knee pain -Because of the persistent severe pain the patient is having we will arrange for her  to get an MRI of the knee -We will also arrange for her to see the orthopedic surgeon that comes to the office  3. Chronic atrial fibrillation -Because of the chronic atrial fibrillation and taking Coumadin she is unable to take anti-inflammatory medicines  Patient Instructions  Increased pain medicine by taking an extra half and one during the day and taking 2 at night time if necessary. If this does not help control the pain please call us back sooner. We will arrange for you to see the orthopedic surgeon that comes to the office in a couple of weeks We  will also go ahead and arrange to get an MRI of the knee to give [place] more detail about what could be going on.   Arrie Senate MD

## 2014-10-18 ENCOUNTER — Other Ambulatory Visit: Payer: Self-pay | Admitting: Family Medicine

## 2014-10-19 ENCOUNTER — Other Ambulatory Visit: Payer: Self-pay | Admitting: *Deleted

## 2014-10-22 ENCOUNTER — Ambulatory Visit (HOSPITAL_COMMUNITY)
Admission: RE | Admit: 2014-10-22 | Discharge: 2014-10-22 | Disposition: A | Discharge status: Home / Self Care | Payer: Medicare Other | Source: Ambulatory Visit | Attending: Family Medicine | Admitting: Family Medicine

## 2014-10-22 DIAGNOSIS — M25562 Pain in left knee: Secondary | ICD-10-CM | POA: Diagnosis not present

## 2014-10-22 DIAGNOSIS — M1712 Unilateral primary osteoarthritis, left knee: Secondary | ICD-10-CM

## 2014-10-22 DIAGNOSIS — M23242 Derangement of anterior horn of lateral meniscus due to old tear or injury, left knee: Secondary | ICD-10-CM | POA: Diagnosis not present

## 2014-10-23 ENCOUNTER — Other Ambulatory Visit: Payer: Self-pay | Admitting: Family Medicine

## 2014-10-28 ENCOUNTER — Other Ambulatory Visit: Payer: Medicare Other

## 2014-10-28 DIAGNOSIS — M1712 Unilateral primary osteoarthritis, left knee: Secondary | ICD-10-CM | POA: Diagnosis not present

## 2014-11-04 ENCOUNTER — Ambulatory Visit (INDEPENDENT_AMBULATORY_CARE_PROVIDER_SITE_OTHER): Payer: Medicare Other | Admitting: Pharmacist

## 2014-11-04 DIAGNOSIS — I4891 Unspecified atrial fibrillation: Secondary | ICD-10-CM

## 2014-11-04 DIAGNOSIS — M25562 Pain in left knee: Secondary | ICD-10-CM

## 2014-11-04 MED ORDER — HYDROCODONE-ACETAMINOPHEN 5-325 MG PO TABS
1.0000 | ORAL_TABLET | Freq: Four times a day (QID) | ORAL | Status: DC | PRN
Start: 2014-11-04 — End: 2014-11-08

## 2014-11-04 NOTE — Patient Instructions (Signed)
Anticoagulation Dose Instructions as of 11/04/2014      Kari [name] Tue Wed Thu Fri Sat   New Dose 2.5 mg 1.25 mg 2.5 mg 2.5 mg 2.5 mg 1.25 mg 2.5 mg    Description        Continue current warfarin dose 2.5mg  tablet - take 1/2 tablet on Mondays and Fridays and 1 tablet all other days.     INR was 2.8 today

## 2014-11-08 ENCOUNTER — Other Ambulatory Visit: Payer: Self-pay | Admitting: Family Medicine

## 2014-11-09 NOTE — Telephone Encounter (Signed)
Last filled 10/18/14. Rx will print

## 2014-11-10 ENCOUNTER — Other Ambulatory Visit: Payer: Self-pay | Admitting: Family Medicine

## 2014-11-11 ENCOUNTER — Other Ambulatory Visit: Payer: Self-pay | Admitting: Family Medicine

## 2014-11-23 DIAGNOSIS — I70203 Unspecified atherosclerosis of native arteries of extremities, bilateral legs: Secondary | ICD-10-CM | POA: Diagnosis not present

## 2014-11-23 DIAGNOSIS — L84 Corns and callosities: Secondary | ICD-10-CM | POA: Diagnosis not present

## 2014-11-23 DIAGNOSIS — B351 Tinea unguium: Secondary | ICD-10-CM | POA: Diagnosis not present

## 2014-11-24 DIAGNOSIS — M1712 Unilateral primary osteoarthritis, left knee: Secondary | ICD-10-CM | POA: Diagnosis not present

## 2014-12-03 DIAGNOSIS — M1712 Unilateral primary osteoarthritis, left knee: Secondary | ICD-10-CM | POA: Diagnosis not present

## 2014-12-17 DIAGNOSIS — M1712 Unilateral primary osteoarthritis, left knee: Secondary | ICD-10-CM | POA: Diagnosis not present

## 2014-12-20 ENCOUNTER — Ambulatory Visit (INDEPENDENT_AMBULATORY_CARE_PROVIDER_SITE_OTHER): Payer: Medicare Other | Admitting: Family Medicine

## 2014-12-20 ENCOUNTER — Encounter: Payer: Self-pay | Admitting: Family Medicine

## 2014-12-20 VITALS — BP 138/83 | HR 81 | Temp 97.3°F | Ht 61.0 in | Wt 153.0 lb

## 2014-12-20 DIAGNOSIS — I10 Essential (primary) hypertension: Secondary | ICD-10-CM

## 2014-12-20 DIAGNOSIS — I482 Chronic atrial fibrillation, unspecified: Secondary | ICD-10-CM

## 2014-12-20 DIAGNOSIS — E559 Vitamin D deficiency, unspecified: Secondary | ICD-10-CM | POA: Diagnosis not present

## 2014-12-20 DIAGNOSIS — E785 Hyperlipidemia, unspecified: Secondary | ICD-10-CM

## 2014-12-20 LAB — POCT CBC
GRANULOCYTE PERCENT: 75 % (ref 37–80)
HCT, POC: 43.8 % (ref 37.7–47.9)
Hemoglobin: 12.9 g/dL (ref 12.2–16.2)
LYMPH, POC: 1.6 (ref 0.6–3.4)
MCH, POC: 24.7 pg — AB (ref 27–31.2)
MCHC: 29.5 g/dL — AB (ref 31.8–35.4)
MCV: 83.7 fL (ref 80–97)
MPV: 8.4 fL (ref 0–99.8)
PLATELET COUNT, POC: 257 10*3/uL (ref 142–424)
POC GRANULOCYTE: 5.8 (ref 2–6.9)
POC LYMPH %: 21.1 % (ref 10–50)
RBC: 5.23 M/uL (ref 4.04–5.48)
RDW, POC: 20 %
WBC: 7.7 10*3/uL (ref 4.6–10.2)

## 2014-12-20 LAB — POCT INR: INR: 3.6

## 2014-12-20 MED ORDER — EZETIMIBE 10 MG PO TABS: ORAL_TABLET | ORAL | Status: DC

## 2014-12-20 MED ORDER — WARFARIN SODIUM 2.5 MG PO TABS: ORAL_TABLET | ORAL | Status: DC

## 2014-12-20 MED ORDER — PRAVASTATIN SODIUM 80 MG PO TABS: ORAL_TABLET | ORAL | Status: DC

## 2014-12-20 MED ORDER — AMLODIPINE BESYLATE-VALSARTAN 5-320 MG PO TABS: ORAL_TABLET | ORAL | Status: DC

## 2014-12-20 MED ORDER — ATENOLOL 50 MG PO TABS: ORAL_TABLET | ORAL | Status: DC

## 2014-12-20 MED ORDER — CAPTOPRIL 50 MG PO TABS: ORAL_TABLET | ORAL | Status: DC

## 2014-12-20 NOTE — Progress Notes (Signed)
Subjective:    Patient ID: Kari [name], female    DOB: 1933-12-13, 81 y.o.   MRN: [id number]  HPI Pt here for follow up and management of chronic medical problems which includes atrial fibrillation, hypertension, and hyperlipemia. She is taking medications regularly. Patient is doing well and she is in good spirits. Her biggest problem is her left knee pain for which she is seeing orthopedic surgeon and getting injections. She has 2 more injections planned with the hope that this will help her knee pain and improve the symptoms that she is having with it. She denies chest pain shortness of breath or GI symptoms or voiding symptoms. She stays as active as she can be.        Patient Active Problem List   Diagnosis Date Noted  . Metabolic syndrome 56/25/6389  . Osteoporosis 03/11/2013  . A-fib 11/24/2012  . Hypokalemia 05/04/2012  . Atypical chest pain 05/04/2012  . Obesity   . Symptomatic menopausal or female climacteric states   . Kyphosis   . Gallstones   . Personal history of adenomatous colonic polyps   . HYPERLIPIDEMIA 03/29/2009  . HYPERTENSION 03/29/2009   Outpatient Encounter Prescriptions as of 12/20/2014  Medication Sig  . amLODipine-valsartan (EXFORGE) 5-320 MG per tablet TAKE (1/2) TABLET DAILY.  Marland Kitchen atenolol (TENORMIN) 50 MG tablet TAKE (1) TABLET TWICE A DAY.  . captopril (CAPOTEN) 50 MG tablet TAKE (1) TABLET TWICE A DAY FOR HIGH BLOOD PRESSURE.  Marland Kitchen Cholecalciferol (VITAMIN D3) 1000 UNITS CAPS Take 1 capsule by mouth every morning.   . ezetimibe (ZETIA) 10 MG tablet TAKE 1 TABLET ONCE DAILY FOR CHOLESTEROL  . HYDROcodone-acetaminophen (NORCO/VICODIN) 5-325 MG per tablet TAKE 1 TABLET EVERY 4 TO 6 HOURS OR AS DIRECTED  . pravastatin (PRAVACHOL) 80 MG tablet TAKE 1 TABLET ONCE DAILY FOR CHOLESTEROL  . warfarin (COUMADIN) 2.5 MG tablet TAKE 1 TABLET ONCE DAILY OR AS DIRECTED  . [DISCONTINUED] amLODipine-valsartan (EXFORGE) 5-320 MG per tablet TAKE (1/2) TABLET DAILY.    . [DISCONTINUED] atenolol (TENORMIN) 50 MG tablet TAKE (1) TABLET TWICE A DAY.  . [DISCONTINUED] captopril (CAPOTEN) 50 MG tablet TAKE (1) TABLET TWICE A DAY FOR HIGH BLOOD PRESSURE.  . [DISCONTINUED] pravastatin (PRAVACHOL) 80 MG tablet TAKE 1 TABLET ONCE DAILY FOR CHOLESTEROL  . [DISCONTINUED] warfarin (COUMADIN) 2.5 MG tablet TAKE 1 TABLET ONCE DAILY OR AS DIRECTED  . [DISCONTINUED] ZETIA 10 MG tablet TAKE 1 TABLET ONCE DAILY FOR CHOLESTEROL    Review of Systems  Constitutional: Negative.   HENT: Negative.   Eyes: Negative.   Respiratory: Negative.   Cardiovascular: Negative.   Gastrointestinal: Negative.   Endocrine: Negative.   Genitourinary: Negative.   Musculoskeletal: Negative.   Skin: Negative.   Allergic/Immunologic: Negative.   Neurological: Negative.   Hematological: Negative.   Psychiatric/Behavioral: Negative.        Objective:   Physical Exam  Constitutional: She is oriented to person, place, and time. She appears well-developed and well-nourished. No distress.  The patient is calm and alert  HENT:  Head: Normocephalic and atraumatic.  Right Ear: External ear normal.  Left Ear: External ear normal.  Nose: Nose normal.  Mouth/Throat: Oropharynx is clear and moist. No oropharyngeal exudate.  Eyes: Conjunctivae and EOM are normal. Pupils are equal, round, and reactive to light. Right eye exhibits no discharge. Left eye exhibits no discharge. No scleral icterus.  Neck: Normal range of motion. Neck supple. No thyromegaly present.  No carotid bruits or anterior cervical adenopathy  Cardiovascular: Normal rate, normal heart sounds and intact distal pulses.   No murmur heard. Heart is irregular irregular at 84/m  Pulmonary/Chest: Effort normal and breath sounds normal. No respiratory distress. She has no wheezes. She has no rales. She exhibits no tenderness.  Clear anteriorly and posteriorly  Abdominal: Soft. Bowel sounds are normal. She exhibits no mass. There is  no tenderness. There is no rebound and no guarding.  Normal bowel sounds no bruits audible  Musculoskeletal: Normal range of motion. She exhibits no edema or tenderness.  Left knee stiffness  Lymphadenopathy:    She has no cervical adenopathy.  Neurological: She is alert and oriented to person, place, and time. She has normal reflexes. No cranial nerve deficit.  Skin: Skin is warm and dry. No rash noted.  Psychiatric: She has a normal mood and affect. Her behavior is normal. Judgment and thought content normal.  Nursing note and vitals reviewed.  BP 138/83 mmHg  Pulse 81  Temp(Src) 97.3 F (36.3 C) (Oral)  Ht 5' 1"  (1.549 m)  Wt 153 lb (69.4 kg)  BMI 28.92 kg/m2        Assessment & Plan:   1. Vitamin D deficiency -Continue current treatment pending results of lab work - POCT CBC - Vit D  25 hydroxy (rtn osteoporosis monitoring)  2. Hyperlipemia -Previous cholesterol numbers have been good and patient should continue with current treatment until this lab work from today is returned - POCT CBC - NMR, lipoprofile  3. Essential hypertension -The blood pressure is good and she should continue with all her medicines she is taking to help control this. - POCT CBC - BMP8+EGFR - Hepatic function panel  4. Chronic atrial fibrillation -Follow-up with cardiology yearly - POCT INR - POCT CBC  Meds ordered this encounter  Medications  . amLODipine-valsartan (EXFORGE) 5-320 MG per tablet    Sig: TAKE (1/2) TABLET DAILY.    Dispense:  30 tablet    Refill:  11  . atenolol (TENORMIN) 50 MG tablet    Sig: TAKE (1) TABLET TWICE A DAY.    Dispense:  60 tablet    Refill:  11  . captopril (CAPOTEN) 50 MG tablet    Sig: TAKE (1) TABLET TWICE A DAY FOR HIGH BLOOD PRESSURE.    Dispense:  60 tablet    Refill:  11  . pravastatin (PRAVACHOL) 80 MG tablet    Sig: TAKE 1 TABLET ONCE DAILY FOR CHOLESTEROL    Dispense:  30 tablet    Refill:  11  . warfarin (COUMADIN) 2.5 MG tablet     Sig: TAKE 1 TABLET ONCE DAILY OR AS DIRECTED    Dispense:  30 tablet    Refill:  11  . ezetimibe (ZETIA) 10 MG tablet    Sig: TAKE 1 TABLET ONCE DAILY FOR CHOLESTEROL    Dispense:  30 tablet    Refill:  11   Patient Instructions                       Medicare Annual Wellness Visit  [facility] and the medical providers at [facility] strive to bring you the best medical care.  In doing so we not only want to address your current medical conditions and concerns but also to detect new conditions early and prevent illness, disease and health-related problems.    Medicare offers a yearly Wellness Visit which allows our clinical staff to assess your need for preventative services including  immunizations, lifestyle education, counseling to decrease risk of preventable diseases and screening for fall risk and other medical concerns.    This visit is provided free of charge (no copay) for all Medicare recipients. The clinical pharmacists at [facility] have begun to conduct these Wellness Visits which will also include a thorough review of all your medications.    As you primary medical provider recommend that you make an appointment for your Annual Wellness Visit if you have not done so already this year.  You may set up this appointment before you leave today or you may call back ([phone]) and schedule an appointment.  Please make sure when you call that you mention that you are scheduling your Annual Wellness Visit with the clinical pharmacist so that the appointment may be made for the proper length of time.     Continue current medications. Continue good therapeutic lifestyle changes which include good diet and exercise. Fall precautions discussed with patient. If an FOBT was given today- please return it to our front desk. If you are over 60 years old - you may need Prevnar 34 or the adult Pneumonia vaccine.  Flu Shots are still available at our  office. If you still haven't had one please call to set up a nurse visit to get one.   After your visit with [place] today you will receive a survey in the mail or online from [employer] regarding your care with [place]. Please take a moment to fill this out. Your feedback is very important to [place] as you can help [place] better understand your patient needs as well as improve your experience and satisfaction. WE CARE ABOUT YOU!!!   Keep follow-up appointment with orthopedic surgeon Keep follow-up appointment with cardiology for atrial fibrillation Stay is active as possible and the summer always drink plenty of fluids   Arrie Senate MD

## 2014-12-20 NOTE — Patient Instructions (Addendum)
Medicare Annual Wellness Visit  [facility] and the medical providers at [facility] strive to bring you the best medical care.  In doing so we not only want to address your current medical conditions and concerns but also to detect new conditions early and prevent illness, disease and health-related problems.    Medicare offers a yearly Wellness Visit which allows our clinical staff to assess your need for preventative services including immunizations, lifestyle education, counseling to decrease risk of preventable diseases and screening for fall risk and other medical concerns.    This visit is provided free of charge (no copay) for all Medicare recipients. The clinical pharmacists at [facility] have begun to conduct these Wellness Visits which will also include a thorough review of all your medications.    As you primary medical provider recommend that you make an appointment for your Annual Wellness Visit if you have not done so already this year.  You may set up this appointment before you leave today or you may call back ([phone]) and schedule an appointment.  Please make sure when you call that you mention that you are scheduling your Annual Wellness Visit with the clinical pharmacist so that the appointment may be made for the proper length of time.     Continue current medications. Continue good therapeutic lifestyle changes which include good diet and exercise. Fall precautions discussed with patient. If an FOBT was given today- please return it to our front desk. If you are over 25 years old - you may need Prevnar 12 or the adult Pneumonia vaccine.  Flu Shots are still available at our office. If you still haven't had one please call to set up a nurse visit to get one.   After your visit with [place] today you will receive a survey in the mail or online from [employer] regarding your care with [place]. Please take a moment to  fill this out. Your feedback is very important to [place] as you can help [place] better understand your patient needs as well as improve your experience and satisfaction. WE CARE ABOUT YOU!!!   Keep follow-up appointment with orthopedic surgeon Keep follow-up appointment with cardiology for atrial fibrillation Stay as active as possible and in the summer always drink plenty of fluids  Anticoagulation Dose Instructions as of 12/20/2014      Dorene Grebe Tue Wed Thu Fri Sat   New Dose 2.5 mg 1.25 mg 2.5 mg 1.25 mg 2.5 mg 1.25 mg 2.5 mg    Description        Do not take warfarin dose tomorrow - Tuesday, April 19th, then decrease current warfarin dose to 2.5mg  tablet - take 1/2 tablet on Mondays, Wednesdays and Fridays and 1 tablet all other days.      INR was 3.6 today

## 2014-12-21 LAB — NMR, LIPOPROFILE
CHOLESTEROL: 136 mg/dL (ref 100–199)
HDL Cholesterol by NMR: 45 mg/dL (ref >39)
HDL PARTICLE NUMBER: 32.7 umol/L (ref >=30.5)
LDL PARTICLE NUMBER: 894 nmol/L (ref <1000)
LDL Size: 21.2 nm (ref >20.5)
LDL-C: 70 mg/dL (ref 0–99)
LP-IR Score: 57 — ABNORMAL HIGH (ref <=45)
Small LDL Particle Number: 449 nmol/L (ref <=527)
TRIGLYCERIDES BY NMR: 107 mg/dL (ref 0–149)

## 2014-12-21 LAB — BMP8+EGFR
BUN/Creatinine Ratio: 10 — ABNORMAL LOW (ref 11–26)
BUN: 7 mg/dL — ABNORMAL LOW (ref 8–27)
CO2: 27 mmol/L (ref 18–29)
CREATININE: 0.68 mg/dL (ref 0.57–1.00)
Calcium: 9.2 mg/dL (ref 8.7–10.3)
Chloride: 99 mmol/L (ref 97–108)
GFR calc Af Amer: 97 mL/min/{1.73_m2} (ref >59)
GFR calc non Af Amer: 84 mL/min/{1.73_m2} (ref >59)
Glucose: 110 mg/dL — ABNORMAL HIGH (ref 65–99)
Potassium: 3.4 mmol/L — ABNORMAL LOW (ref 3.5–5.2)
SODIUM: 141 mmol/L (ref 134–144)

## 2014-12-21 LAB — HEPATIC FUNCTION PANEL
ALBUMIN: 4 g/dL (ref 3.5–4.8)
ALT: 12 IU/L (ref 0–32)
AST: 21 IU/L (ref 0–40)
Alkaline Phosphatase: 74 IU/L (ref 39–117)
Bilirubin Total: 0.5 mg/dL (ref 0.0–1.2)
Bilirubin, Direct: 0.16 mg/dL (ref 0.00–0.40)
TOTAL PROTEIN: 6.8 g/dL (ref 6.0–8.5)

## 2014-12-21 LAB — VITAMIN D 25 HYDROXY (VIT D DEFICIENCY, FRACTURES): VIT D 25 HYDROXY: 23.3 ng/mL — AB (ref 30.0–100.0)

## 2014-12-22 MED ORDER — VITAMIN D (ERGOCALCIFEROL) 1.25 MG (50000 UNIT) PO CAPS: 50000.0000 [IU] | ORAL_CAPSULE | ORAL | Status: DC

## 2014-12-22 NOTE — Addendum Note (Signed)
Addended by: Marylin Crosby on: 12/22/2014 03:31 PM   Modules accepted: Orders

## 2014-12-28 DIAGNOSIS — M1712 Unilateral primary osteoarthritis, left knee: Secondary | ICD-10-CM | POA: Diagnosis not present

## 2015-01-03 ENCOUNTER — Ambulatory Visit (INDEPENDENT_AMBULATORY_CARE_PROVIDER_SITE_OTHER): Payer: Medicare Other | Admitting: Pharmacist

## 2015-01-03 DIAGNOSIS — Z1212 Encounter for screening for malignant neoplasm of rectum: Secondary | ICD-10-CM

## 2015-01-03 DIAGNOSIS — I482 Chronic atrial fibrillation, unspecified: Secondary | ICD-10-CM

## 2015-01-03 DIAGNOSIS — I4891 Unspecified atrial fibrillation: Secondary | ICD-10-CM

## 2015-01-03 LAB — POCT INR: INR: 1.8

## 2015-01-03 NOTE — Addendum Note (Signed)
Addended by: Earlene Plater on: 01/03/2015 03:34 PM   Modules accepted: Orders

## 2015-01-03 NOTE — Patient Instructions (Signed)
Anticoagulation Dose Instructions as of 01/03/2015      Dorene Grebe Tue Wed Thu Fri Sat   New Dose 2.5 mg 1.25 mg 2.5 mg 2.5 mg 2.5 mg 1.25 mg 2.5 mg    Description        Change warfarin dose to 2.5mg  - take 1/2 tablet on mondays and fridays.  Take 1 tablet all other days.     INR was 1.8 today

## 2015-01-07 DIAGNOSIS — M1712 Unilateral primary osteoarthritis, left knee: Secondary | ICD-10-CM | POA: Diagnosis not present

## 2015-01-07 LAB — FECAL OCCULT BLOOD, IMMUNOCHEMICAL: Fecal Occult Bld: NEGATIVE

## 2015-02-02 ENCOUNTER — Encounter: Payer: Self-pay | Admitting: Pharmacist

## 2015-02-02 ENCOUNTER — Ambulatory Visit (INDEPENDENT_AMBULATORY_CARE_PROVIDER_SITE_OTHER): Payer: Medicare Other | Admitting: Pharmacist

## 2015-02-02 VITALS — BP 138/74 | HR 66 | Ht 60.0 in | Wt 155.0 lb

## 2015-02-02 DIAGNOSIS — I4891 Unspecified atrial fibrillation: Secondary | ICD-10-CM

## 2015-02-02 DIAGNOSIS — E876 Hypokalemia: Secondary | ICD-10-CM | POA: Diagnosis not present

## 2015-02-02 DIAGNOSIS — Z0000 Encounter for general adult medical examination without abnormal findings: Secondary | ICD-10-CM

## 2015-02-02 DIAGNOSIS — E559 Vitamin D deficiency, unspecified: Secondary | ICD-10-CM | POA: Diagnosis not present

## 2015-02-02 DIAGNOSIS — R7309 Other abnormal glucose: Secondary | ICD-10-CM

## 2015-02-02 DIAGNOSIS — R7303 Prediabetes: Secondary | ICD-10-CM

## 2015-02-02 DIAGNOSIS — Z23 Encounter for immunization: Secondary | ICD-10-CM | POA: Diagnosis not present

## 2015-02-02 LAB — POCT GLYCOSYLATED HEMOGLOBIN (HGB A1C): Hemoglobin A1C: 5.8

## 2015-02-02 LAB — POCT INR: INR: 2.4

## 2015-02-02 NOTE — Patient Instructions (Addendum)
Ms. Nappi , Thank you for taking time to come for your Medicare Wellness Visit. I appreciate your ongoing commitment to your health goals. Please review the following plan we discussed and let me know if I can assist you in the future.    Increase non-starchy vegetables - carrots, green bean, squash, zucchini, tomatoes, onions, peppers, spinach and other green leafy vegetables, cabbage, lettuce, cucumbers, asparagus, okra (not fried), eggplant limit sugar and processed foods (cakes, cookies, ice cream, crackers and chips) Increase fresh fruit but limit serving sizes 1/2 cup or about the size of tennis or baseball limit red meat to no more than 1-2 times per week (serving size about the size of your palm) Choose whole grains / lean proteins - whole wheat bread, quinoa, whole grain rice (1/2 cup), fish, chicken, [place]   Please bring in names of eye doctor and where you get mammogram to next visit so we can request record of last eye exam and mammogram.  This is a list of the screening recommended for you and due dates:  Health Maintenance  Topic Date Due  . Pneumonia vaccines (2 of 2 - PPSV23) Done today  . Mammogram  04/21/2015*  . Flu Shot  04/04/2015  . Colon Cancer Screening  02/26/2017  . Tetanus Vaccine  10/04/2017  . DEXA scan (bone density measurement)  Due 03/2015 - appointment made today  . Shingles Vaccine  Completed  *Topic was postponed. The date shown is not the original due date.    Anticoagulation Dose Instructions as of 02/02/2015      Dorene Grebe Tue Wed Thu Fri Sat   New Dose 2.5 mg 1.25 mg 2.5 mg 2.5 mg 2.5 mg 1.25 mg 2.5 mg    Description        Continue warfarin dose to 2.5mg  - take 1/2 tablet on mondays and fridays.  Take 1 tablet all other days.     INR was 2.4 today   Prediabetes Many people have heard about type 2 diabetes, but its common precursor, prediabetes, doesn't get as much attention. Prediabetes is estimated by CDC to affect 86 million Americans  (this includes 51% of people 65 years and older), and an estimated 90% of people with prediabetes don't even know it. According to the CDC, 15-30% of these individuals will develop type 2 diabetes within five years. In other words, as many as 26 million people that currently have prediabetes could develop type 2 diabetes by 2020, effectively doubling the number of people with type 2 diabetes in the [place].  What is prediabetes? Prediabetes is a condition where blood sugar levels are higher than normal, but not high enough to be diagnosed as type 2 diabetes. This occurs when the body has problems in processing glucose properly, and sugar starts to build up in the bloodstream instead of fueling cells in muscles and tissues. Insulin is the hormone that tells cells to take up glucose, and in prediabetes, people typically initially develop insulin resistance (where the body's cells can't respond to insulin as well), and over time (if no actions are taken to reverse the situation) the ability to produce sufficient insulin is reduced. People with prediabetes also commonly have high blood pressure as well as abnormal blood lipids (e.g. cholesterol). These often occur prior to the rise of blood glucose levels.  What are the symptoms of prediabetes? People typically do not have symptoms of prediabetes, which is partially why up to 90% of people don't know they have it. The ADA  reports that some people with prediabetes may develop symptoms of type 2 diabetes, though even many people diagnosed with type 2 diabetes show little or no symptoms initially at diagnosis.  How is prediabetes diagnosed? According to the American Diabetes Association, prediabetes can be diagnosed through one of the following tests: 1. A glycated hemoglobin test, also known as HbA1c or simply A1c, gives an idea of the body's average blood sugar levels from the past two or three months. It is usually done with a small drop of blood from a fingerstick or  as part of having blood taken in a doctor's office, hospital, or laboratory.  Your A1c was 5.8% today  A1c Level Diagnosis  Less than 5.7% Normal  5.7% to 6.4% Prediabetes  6.5% and higher Diabetes   2.  A fasting plasma glucose (FPG) test measures a person's blood glucose level after fasting (not eating) for eight hours - this is typically done in the morning. If a test shows positive for prediabetes, a second test should be taken on a different day to confirm the diagnosis. FPG Level Diagnosis  Less than 100 mg/dl Normal  100 mg/dl to 125 mg/dl Prediabetes  126 mg/dl and higher Diabetes   Who is at risk of developing prediabetes? A well-known paper published in the Lancet in 2010 recommends screening for type 2 diabetes (which would also screen for prediabetes) every 3-5 years in all adults over the age of 33, regardless of other risk factors. Overweight and obese adults (a BMI >25 kg/m2) are also at significantly greater risk for developing prediabetes, as well as people with a family history of type 2 diabetes. According to the CDC, several other factors can have moderate influences on prediabetes risk in addition to age, weight, and family history: People with an [place] American, Hispanic/Latino, American [place] American, or [place] racial or ethnic background. The 2015 ADA Standards of Medical Care recommendations suggest Asian Americans with a BMI of 23 or above be screened for type 2 diabetes.  Women with a history of diabetes during pregnancy ("gestational diabetes") or have given birth to a baby weighing nine pounds or more. People who are physically active fewer than three times a week. The CDC offers a fast, online screening test for evaluating the risk for prediabetes. The ADA also offers a screening test to assess type 2 diabetes risk. Of course, these tests do not themselves confirm a prediabetes diagnosis, but just if someone may be at higher risk of developing  it.  Why do people develop prediabetes? Prediabetes develops through a combination of factors that are still being investigated. For sure, lifestyle factors (food, exercise, stress, sleep) play a role, but family history and genetics certainly do as well. It is easy to assume that prediabetes is the result of being overweight, but the relationship is not that simple. While obesity is one underlying cause of insulin resistance, many overweight individuals may never develop prediabetes or type 2 diabetes, and a minority of people with prediabetes have never been overweight. To make matters worse, it can be increasingly difficult to make healthy choices in today's toxic food environment that steers all of [place] to make the wrong food choices, and there are many factors that can contribute to weight gain in addition to diet.  Is a prediabetes diagnosis serious? There has been significant debate around the term 'prediabetes,' and whether it should be considered cause for alarm. On the one hand, it serves as a risk factor for type 2  diabetes and a host of other complications, including heart disease, and ultimately prediabetes implies that a degree of metabolic problems have started to occur in the body. On the other hand, it places a diagnosis on many people who may never develop type 2 diabetes. Again, according to the CDC, 15-30% of those with prediabetes will develop type 2 diabetes within five years. However, a 2012 Lancet article cites 5-10% of those with prediabetes each year will also revert back to healthy blood sugars. What's critical is not necessarily the cutoff itself, but where someone falls within the ranges listed above. The level of risk of developing type 2 diabetes is closely related to A1c or FPG at diagnosis. Those in the higher ranges (A1c closer to 6.4%, FPG closer to 125 mg/dl) are much more likely to progress to type 2 diabetes, whereas those at lower ranges (A1c closer to 5.7%, FPG closer to  100 mg/dl) are relatively more likely to revert back to normal glucose levels or stay within the prediabetes range. Age of diagnosis and the level of insulin production still occurring at diagnosis also impact the chances of reverting to normoglycemia (normal blood sugar levels).  What can people with prediabetes do to avoid the progression from prediabetes to type 2 diabetes? The most important action people diagnosed with prediabetes can take is to focus on living a healthy lifestyle. This includes making healthy food choices, controlling portions, and increasing physical activity. Regarding weight control, research shows losing 5-7% (often about 10-20 lbs.) from your initial body weight and keeping off as much of that weight over time as possible is critical to lowering the risk of type 2 diabetes. This task is of course easier said than done, but sustained weight loss over time can be key to improving health and delaying or preventing the onset of type 2 diabetes. Several prediabetes interventions exist based on evidence from the landmark Diabetes Prevention Program (DPP) study. The DPP study reported that moderate weight loss (5-7% of body weight, or ~10-15 lbs. for someone weighing 200 lbs.), counseling, and education on healthy eating and behavior reduced the risk of developing type 2 diabetes by 58%. Data presented at the [place] 2014 conference showed that after 15 years of follow-up of the DPP study groups, the results were still encouraging: 27% of those in the original lifestyle group had a significant reduction in type 2 diabetes progression compared to the control group. If you or someone you know has been told they have prediabetes, here are a few helpful resources: In-person diabetes prevention programs: The CDC offers a one year long lifestyle change program through its National Diabetes Prevention Program (NDPP) at various locations throughout the [place] to help participants adopt healthy habits and  prevent or delay progression to type 2 diabetes. This program is a major undertaking by the CDC to translate the findings from the DPP study into a real world setting, a significant effort indeed! Online diabetes prevention programs: The CDC has now given pending recognition status to three digital prevention programs: [place], and [facility]. These offer the same one year long educational curriculum as the DPP study, but in an online format. Some insurance companies and employers cover these programs, and you can find more information at the links above. These digital versions are excellent options for those who live far away from NDPP locations or who prefer the anonymity and convenience of doing the program online. Metformin: The DPP study found that metformin, the safest first-line therapy  for type 2 diabetes, may help delay the onset of type 2 diabetes in people with prediabetes. Participants who took the low-cost generic drug had a 31% reduced risk of developing type 2 diabetes compared to the control group (those not on metformin or intensive lifestyle intervention). Again, 15-year follow up data showed that 17% of those on metformin continued to have a significant reduction in type 2 progression. At this time, metformin (or any other medication, for that matter) is not currently FDA approved for prediabetes, and it is sometimes prescribed "off-label" by a healthcare provider. Your healthcare provider can give you more information and determine whether metformin is a good option for you.  Can prediabetes be "cured"? In the early stages of prediabetes (and type 2 diabetes), diligent attention to food choices and activity, and most importantly weight loss, can improve blood sugar numbers, effectively "reversing" the disease and reducing the odds of developing type 2 diabetes. However, some people may have underlying factors (such as family history and genetics) that put them at a greater  risk of type 2 diabetes, meaning they will always require careful attention to blood sugar levels and lifestyle choices. Returning to old habits will likely put someone back on the road to prediabetes, and eventually, type 2 diabetes

## 2015-02-02 NOTE — Progress Notes (Signed)
Patient ID: Kari [name], female   DOB: [birth year], 81 y.o.   MRN: [id number]    Subjective:   Kari [name] is a 81 y.o. female who presents for an Subsequent Medicare Annual Wellness Visit.  Patient is also due to recheck INR today.  Current warfarin dose is 2.38m and she take 1/2 tablet on mondays and fridays and 1 tablet all other days.  Patient also had elevated FBG 12/20/14 and low potassium.  - needs recheck BMET and to have A1c checked.  Current Medications (verified) Outpatient Encounter Prescriptions as of 02/02/2015  Medication Sig  . amLODipine-valsartan (EXFORGE) 5-320 MG per tablet TAKE (1/2) TABLET DAILY.  .Marland Kitchenatenolol (TENORMIN) 50 MG tablet TAKE (1) TABLET TWICE A DAY.  . captopril (CAPOTEN) 50 MG tablet TAKE (1) TABLET TWICE A DAY FOR HIGH BLOOD PRESSURE.  .Marland Kitchenezetimibe (ZETIA) 10 MG tablet TAKE 1 TABLET ONCE DAILY FOR CHOLESTEROL  . pravastatin (PRAVACHOL) 80 MG tablet TAKE 1 TABLET ONCE DAILY FOR CHOLESTEROL  . Vitamin D, Ergocalciferol, (DRISDOL) 50000 UNITS CAPS capsule Take 1 capsule (50,000 Units total) by mouth every 7 (seven) days.  .Marland Kitchenwarfarin (COUMADIN) 2.5 MG tablet TAKE 1 TABLET ONCE DAILY OR AS DIRECTED  . HYDROcodone-acetaminophen (NORCO/VICODIN) 5-325 MG per tablet TAKE 1 TABLET EVERY 4 TO 6 HOURS OR AS DIRECTED (Patient not taking: Reported on 02/02/2015)   No facility-administered encounter medications on file as of 02/02/2015.    Allergies (verified) Erythromycin and Penicillins   History: Past Medical History  Diagnosis Date  . Obesity   . Symptomatic menopausal or female climacteric states   . Kyphosis   . Gallstones   . Adenomatous polyps   . AF (atrial fibrillation)   . Hyperlipidemia     x5 years  . Hypertension     x5 years  . Hx of adenomatous colonic polyps   . Osteoporosis   . Metabolic syndrome X   . Vitamin D deficiency    Past Surgical History  Procedure Laterality Date  . Vaginal hysterectomy      total  . Bladder suspension      . Colonoscopy  multiple   Family History  Problem Relation Age of Onset  . Heart attack Mother   . Stroke Mother   . Heart attack Father   . Alcohol abuse Father   . Heart attack Other   . Stroke Other   . Breast cancer Other   . Colon cancer Neg Hx   . Stroke Brother   . Heart attack Brother   . Depression Brother     suicide   Social History   Occupational History  . [employer]  . Smoking status: Never Smoker   . Smokeless tobacco: Never Used  . Alcohol Use: No  . Drug Use: No  . Sexual Activity: No    Do you feel safe at home?  Yes  Dietary issues and exercise activities: Current Exercise Habits:: The patient does not participate in regular exercise at present  Current Dietary habits:  Admits to eating ice cream frequently and enjoys sweets.   Objective:    Today's Vitals   02/02/15 1035  BP: 138/74  Pulse: 66  Height: 5' (1.524 m)  Weight: 155 lb (70.308 kg)  PainSc: 0-No pain   Body mass index is 30.27 kg/(m^2).   INR = 2.4 today A1c = 5.8% today  Activities of Daily Living In your present state of health, do you have  any difficulty performing the following activities: 02/02/2015  Hearing? N  Vision? N  Difficulty concentrating or making decisions? N  Walking or climbing stairs? N  Dressing or bathing? Y  Doing errands, shopping? N  Preparing Food and eating ? N  Using the Toilet? N  In the past six months, have you accidently leaked urine? N  Do you have problems with loss of bowel control? N  Managing your Medications? N  Managing your Finances? N  Housekeeping or managing your Housekeeping? N    Are there smokers in your home (other than you)? No   Cardiac Risk Factors include: advanced age (>75mn, >>65women);dyslipidemia;family history of premature cardiovascular disease;hypertension;sedentary lifestyle  Depression Screen PHQ 2/9 Scores 02/02/2015 12/20/2014 10/11/2014 08/17/2014  PHQ - 2 Score 0 0 0 0    Fall  Risk Fall Risk  02/02/2015 12/20/2014 10/11/2014 08/17/2014 02/19/2014  Falls in the past year? _0   Risk for fall due to : - - - - Impaired balance/gait    Cognitive Function: MMSE - Mini Mental State Exam 02/02/2015  Orientation to time 5  Orientation to Place 5  Registration 3  Attention/ Calculation 5  Recall 3  Language- name 2 objects 2  Language- follow 3 step command 3  Language- read & follow direction 1  Write a sentence 1  Copy design 1    Immunizations and Health Maintenance Immunization History  Administered Date(s) Administered  . Influenza Whole 05/04/2010  . Influenza,inj,Quad PF,36+ Mos 05/28/2013, 06/21/2014  . Pneumococcal Conjugate-13 01/06/2014  . Pneumococcal Polysaccharide-23 10/05/1999, 02/02/2015  . Td 10/05/2007   Health Maintenance Due  Topic Date Due  . PNA vac Low Risk Adult (2 of 2 - PPSV23) 01/07/2015    Patient Care Team: DChipper Herb MD as PCP - General (Family Medicine) JMinus Breeding MD as Consulting Physician (Cardiology) Patient could not recall her optometrist or where she usually gets mammogram.  Patient will ask her daughter and get this information to [place] next visit.   Indicate any recent Medical Services you may have received from other than [facility] providers in the past year (date may be approximate).    Assessment:    Annual Wellness Visit  Therapeutic anticoagulation Pre-Diabetes / elevated FBG Hypokalemia   Screening Tests Health Maintenance  Topic Date Due  . PNA vac Low Risk Adult (2 of 2 - PPSV23) 01/07/2015  . MAMMOGRAM  04/21/2015 (Originally 05/04/2014)  . DEXA SCAN  03/12/2015  . INFLUENZA VACCINE  04/04/2015  . COLONOSCOPY  02/26/2017  . TETANUS/TDAP  10/04/2017  . ZOSTAVAX  Completed        Plan:   During the course of the visit [place] educated and counseled about the following appropriate screening and preventive services:   Vaccines to include Pneumoccal, Influenza, Hepatitis B, Td,  Zostavax - patient received Pneumo 23 in office today  Colorectal cancer screening - FOBT and colonoscopy UTD  Cardiovascular disease screening - BP at goal and LDL at goal  Diabetes screening - FBG was elevated 12/2014. Checked A1c today which showed pre-diabtes  Discussed diet - patient to limit frequency and serving sizes of sugar containing foods.  She is also advised to limit serving sizes of CHO containing foods - rice, potatoes, bread, corn and pasta.  Increase lean proteins.  Limit red meat and processed foods.  Increase non starchy vegetables.  Bone Denisty / Osteoporosis Screening - due recheck 03/2015 - appt made  Mammogram - per patient  was done last year - she is to get facility name of where mammogram was performed so we can request records  Glaucoma screening /  Eye Exam - patient has appt 01/2015 - she will have optometrist send records.  Advanced Directives - Patient refused  Restart walking daily - will help with weight and BG. Orders Placed This Encounter  Procedures  . Pneumococcal polysaccharide vaccine 23-valent greater than or equal to 2yo subcutaneous/IM  . BMP8+EGFR  . POCT glycosylated hemoglobin (Hb A1C)    Anticoagulation Dose Instructions as of 02/02/2015      Dorene Grebe Tue Wed Thu Fri Sat   New Dose 2.5 mg 1.25 mg 2.5 mg 2.5 mg 2.5 mg 1.25 mg 2.5 mg    Description        Continue warfarin dose to 2.91m - take 1/2 tablet on mondays and fridays.  Take 1 tablet all other days.     RTC in 4-6 weeks.   Patient Instructions (the written plan) were given to the patient.   ECherre Robins PJauca,  CPP, CDE  02/02/2015

## 2015-02-03 LAB — BMP8+EGFR
BUN/Creatinine Ratio: 9 — ABNORMAL LOW (ref 11–26)
BUN: 6 mg/dL — ABNORMAL LOW (ref 8–27)
CALCIUM: 9.3 mg/dL (ref 8.7–10.3)
CHLORIDE: 101 mmol/L (ref 97–108)
CO2: 26 mmol/L (ref 18–29)
Creatinine, Ser: 0.69 mg/dL (ref 0.57–1.00)
GFR, EST AFRICAN AMERICAN: 96 mL/min/{1.73_m2} (ref >59)
GFR, EST NON AFRICAN AMERICAN: 84 mL/min/{1.73_m2} (ref >59)
GLUCOSE: 110 mg/dL — AB (ref 65–99)
POTASSIUM: 3.5 mmol/L (ref 3.5–5.2)
Sodium: 142 mmol/L (ref 134–144)

## 2015-02-15 DIAGNOSIS — M1712 Unilateral primary osteoarthritis, left knee: Secondary | ICD-10-CM | POA: Diagnosis not present

## 2015-02-25 ENCOUNTER — Ambulatory Visit: Payer: Medicare Other

## 2015-03-16 ENCOUNTER — Ambulatory Visit (INDEPENDENT_AMBULATORY_CARE_PROVIDER_SITE_OTHER): Payer: Medicare Other

## 2015-03-16 ENCOUNTER — Encounter: Payer: Self-pay | Admitting: Pharmacist

## 2015-03-16 ENCOUNTER — Ambulatory Visit (INDEPENDENT_AMBULATORY_CARE_PROVIDER_SITE_OTHER): Payer: Medicare Other | Admitting: Pharmacist

## 2015-03-16 VITALS — Ht 60.0 in | Wt 155.5 lb

## 2015-03-16 DIAGNOSIS — M810 Age-related osteoporosis without current pathological fracture: Secondary | ICD-10-CM | POA: Diagnosis not present

## 2015-03-16 DIAGNOSIS — I4891 Unspecified atrial fibrillation: Secondary | ICD-10-CM

## 2015-03-16 LAB — POCT INR: INR: 2

## 2015-03-16 NOTE — Progress Notes (Signed)
Patient ID: Kari [name], female   DOB: [birth year], 81 y.o.   MRN: [id number]  Osteoporosis [facility] Current Height: Height: 5' (152.4 cm)      Max Lifetime Height:  5\' 2"  Current Weight: Weight: 155 lb 8 oz (70.534 kg)       Ethnicity:Caucasian    HPI: Does pt already have a diagnosis of:  Osteopenia?  No Osteoporosis?  Yes  Back Pain?  No       Kyphosis?  Yes Prior fracture?  Yes -foot around 2009 and wrist 2015 Med(s) for Osteoporosis/Osteopenia:  none Med(s) previously tried for Osteoporosis/Osteopenia:  Alendronate 70mg  weekly - took for 1.5 years.                                                               PMH: Age at menopause:  Surgical in 66's Hysterectomy?  Yes Oophorectomy?  Yes HRT? Yes - Former.  Type/duration: premarin Steroid Use?  No Thyroid med?  No History of cancer?  No History of digestive disorders (ie Crohn's)?  No Current or previous eating disorders?  No Last Vitamin D Result: 24 (08/2012) Last GFR Result:  87 (08/2012)   FH/SH: Family history of osteoporosis?  No Parent with history of hip fracture?  No Family history of breast cancer?  No Exercise?  Yes - some walking Smoking?  No Alcohol?  No    Calcium Assessment Calcium Intake  # of servings/day  Calcium mg  Milk (8 oz) 0.5  x  300  = 150mg   Yogurt (4 oz) 0 x  200 = 0  Cheese (1 oz) 0 x  200 = 0  Other Calcium sources   250mg   Ca supplement None = None   Estimated calcium intake per day 400mg     DEXA Results Date of Test T-Score for AP Spine L1-L4 T-Score for Total Left Hip T-Score for Total Right Hip  03/16/2015 0.3 -1.6 -1.9  03/11/2013 0.0 -1.7 -1.9  02/28/2011 0.4 -1.4 -1.5  06/21/2010 0.4 -1.3 -1.4  05/12/2008 0.8 -1.2 -1.4   **T-score for neck of left hip was -3.0 on 02/28/2011, is -2.3 today.   INR was 2.0 today  Assessment: 1. therapeutic anticoagulation. 2.  Osteoporosis   Recommendations:  1.   Anticoagulation Dose Instructions as of 03/16/2015      Dorene Grebe  Tue Wed Thu Fri Sat   New Dose 2.5 mg 1.25 mg 2.5 mg 2.5 mg 2.5 mg 1.25 mg 2.5 mg    Description        Continue warfarin dose to 2.5mg  - take 1/2 tablet on mondays and fridays.  Take 1 tablet all other days.      2.  Discussed BMD  / DEXA results and discussed fracture risk. BMD is stable and has actually improved compared to 4 years ago.  Patient refuses treatment for osteoporosis at this time. 3.  recommend calcium 1200mg  daily through supplementation or diet.  4.  recommend weight bearing exercise - 30 minutes at least 4 days  per week.   5.  Counseled and educated about fall risk and prevention.  Recheck DEXA:  2 years Recheck INR/Protime - 1 month  Time spent counseling patient:  35 minutes   Cherre Robins, PharmD, CPP

## 2015-03-16 NOTE — Patient Instructions (Signed)
Anticoagulation Dose Instructions as of 03/16/2015      Kari [name] Tue Wed Thu Fri Sat   New Dose 2.5 mg 1.25 mg 2.5 mg 2.5 mg 2.5 mg 1.25 mg 2.5 mg    Description        Continue warfarin dose to 2.5mg  - take 1/2 tablet on mondays and fridays.  Take 1 tablet all other days.     INR was 2.0 today

## 2015-05-03 ENCOUNTER — Ambulatory Visit (INDEPENDENT_AMBULATORY_CARE_PROVIDER_SITE_OTHER): Payer: Medicare Other

## 2015-05-03 ENCOUNTER — Encounter: Payer: Self-pay | Admitting: Family Medicine

## 2015-05-03 ENCOUNTER — Other Ambulatory Visit: Payer: Self-pay | Admitting: Family Medicine

## 2015-05-03 ENCOUNTER — Ambulatory Visit (INDEPENDENT_AMBULATORY_CARE_PROVIDER_SITE_OTHER): Payer: Medicare Other | Admitting: Family Medicine

## 2015-05-03 VITALS — BP 139/80 | HR 96 | Temp 97.5°F | Ht 60.0 in | Wt 156.0 lb

## 2015-05-03 DIAGNOSIS — E559 Vitamin D deficiency, unspecified: Secondary | ICD-10-CM

## 2015-05-03 DIAGNOSIS — I4891 Unspecified atrial fibrillation: Secondary | ICD-10-CM | POA: Diagnosis not present

## 2015-05-03 DIAGNOSIS — I482 Chronic atrial fibrillation, unspecified: Secondary | ICD-10-CM

## 2015-05-03 DIAGNOSIS — I4819 Other persistent atrial fibrillation: Secondary | ICD-10-CM

## 2015-05-03 DIAGNOSIS — I10 Essential (primary) hypertension: Secondary | ICD-10-CM

## 2015-05-03 DIAGNOSIS — I700 Atherosclerosis of aorta: Secondary | ICD-10-CM | POA: Diagnosis not present

## 2015-05-03 DIAGNOSIS — I481 Persistent atrial fibrillation: Secondary | ICD-10-CM | POA: Diagnosis not present

## 2015-05-03 DIAGNOSIS — R7309 Other abnormal glucose: Secondary | ICD-10-CM | POA: Diagnosis not present

## 2015-05-03 DIAGNOSIS — E785 Hyperlipidemia, unspecified: Secondary | ICD-10-CM

## 2015-05-03 DIAGNOSIS — R7303 Prediabetes: Secondary | ICD-10-CM

## 2015-05-03 LAB — POCT INR: INR: 2.4

## 2015-05-03 NOTE — Patient Instructions (Addendum)
Medicare Annual Wellness Visit  [facility] and the medical providers at [facility] strive to bring you the best medical care.  In doing so we not only want to address your current medical conditions and concerns but also to detect new conditions early and prevent illness, disease and health-related problems.    Medicare offers a yearly Wellness Visit which allows our clinical staff to assess your need for preventative services including immunizations, lifestyle education, counseling to decrease risk of preventable diseases and screening for fall risk and other medical concerns.    This visit is provided free of charge (no copay) for all Medicare recipients. The clinical pharmacists at [facility] have begun to conduct these Wellness Visits which will also include a thorough review of all your medications.    As you primary medical provider recommend that you make an appointment for your Annual Wellness Visit if you have not done so already this year.  You may set up this appointment before you leave today or you may call back ([phone]) and schedule an appointment.  Please make sure when you call that you mention that you are scheduling your Annual Wellness Visit with the clinical pharmacist so that the appointment may be made for the proper length of time.     Continue current medications. Continue good therapeutic lifestyle changes which include good diet and exercise. Fall precautions discussed with patient. If an FOBT was given today- please return it to our front desk. If you are over 90 years old - you may need Prevnar 56 or the adult Pneumonia vaccine.  **Flu shots will be available soon--- please call and schedule a FLU-CLINIC appointment**  After your visit with [place] today you will receive a survey in the mail or online from [employer] regarding your care with [place]. Please take a moment to fill this out. Your feedback is  very important to [place] as you can help [place] better understand your patient needs as well as improve your experience and satisfaction. WE CARE ABOUT YOU!!!   **Please join [place] SEPT.22, 2016 from 5:00 to 7:00pm for our OPEN HOUSE! Come out and meet our NEW providers**  stay active and for the remainder of the summer drink plenty of fluids and stay well hydrated  We will call you with the results of your lab work as soon as they become available as well as the chest x-ray results

## 2015-05-03 NOTE — Progress Notes (Addendum)
Subjective:    Patient ID: Kari [name], female    DOB: March 30, 1934, 81 y.o.   MRN: [id number]  HPI Pt here for follow up and management of chronic medical problems which includes hypertension, hyperlipidemia, and atrial fibrillation. She is taking medications regularly. The patient comes in today and as usual has no specific complaints. She will get a chest x-ray today as this is past year and she will get lab work done today. She refuses to have Pap smears done. The patient is doing well and has no specific complaints. She denies chest pain, shortness of breath, trouble with her GI tract including bleeding or melena and any problems with voiding. She stays active. She enjoys her children and great grandchildren. She is due to get her eye exam soon and will have the ophthalmologist send [place] a report regarding that.        Patient Active Problem List   Diagnosis Date Noted  . Vitamin D deficiency 02/02/2015  . Pre-diabetes 02/02/2015  . Metabolic syndrome 40/06/2724  . Osteoporosis 03/11/2013  . Atrial fibrillation, unspecified 11/24/2012  . Hypokalemia 05/04/2012  . Atypical chest pain 05/04/2012  . Obesity   . Symptomatic menopausal or female climacteric states   . Kyphosis   . Gallstones   . Personal history of adenomatous colonic polyps   . HYPERLIPIDEMIA 03/29/2009  . HYPERTENSION 03/29/2009   Outpatient Encounter Prescriptions as of 05/03/2015  Medication Sig  . amLODipine-valsartan (EXFORGE) 5-320 MG per tablet TAKE (1/2) TABLET DAILY.  Marland Kitchen atenolol (TENORMIN) 50 MG tablet TAKE (1) TABLET TWICE A DAY.  . captopril (CAPOTEN) 50 MG tablet TAKE (1) TABLET TWICE A DAY FOR HIGH BLOOD PRESSURE.  Marland Kitchen ezetimibe (ZETIA) 10 MG tablet TAKE 1 TABLET ONCE DAILY FOR CHOLESTEROL  . HYDROcodone-acetaminophen (NORCO/VICODIN) 5-325 MG per tablet TAKE 1 TABLET EVERY 4 TO 6 HOURS OR AS DIRECTED  . pravastatin (PRAVACHOL) 80 MG tablet TAKE 1 TABLET ONCE DAILY FOR CHOLESTEROL  . Vitamin D,  Ergocalciferol, (DRISDOL) 50000 UNITS CAPS capsule Take 1 capsule (50,000 Units total) by mouth every 7 (seven) days.  Marland Kitchen warfarin (COUMADIN) 2.5 MG tablet TAKE 1 TABLET ONCE DAILY OR AS DIRECTED   No facility-administered encounter medications on file as of 05/03/2015.     Review of Systems  Constitutional: Negative.   HENT: Negative.   Eyes: Negative.   Respiratory: Negative.   Cardiovascular: Negative.   Gastrointestinal: Negative.   Endocrine: Negative.   Genitourinary: Negative.   Musculoskeletal: Negative.   Skin: Negative.   Allergic/Immunologic: Negative.   Neurological: Negative.   Hematological: Negative.   Psychiatric/Behavioral: Negative.        Objective:   Physical Exam  Constitutional: She is oriented to person, place, and time. She appears well-developed and well-nourished. No distress.  HENT:  Head: Normocephalic and atraumatic.  Right Ear: External ear normal.  Left Ear: External ear normal.  Mouth/Throat: Oropharynx is clear and moist.  Some nasal congestion and pallor on the left side.  Eyes: Conjunctivae and EOM are normal. Pupils are equal, round, and reactive to light. Right eye exhibits no discharge. Left eye exhibits no discharge. No scleral icterus.  Neck: Normal range of motion. Neck supple. No thyromegaly present.  No carotid bruits cervical adenopathy or thyromegaly  Cardiovascular: Normal rate, normal heart sounds and intact distal pulses.  Exam reveals no gallop and no friction rub.   No murmur heard. The heart is irregular irregular at 72/m  Pulmonary/Chest: Effort normal and breath sounds normal. No  respiratory distress. She has no wheezes. She has no rales. She exhibits no tenderness.  Clear anteriorly and posteriorly  Abdominal: Soft. Bowel sounds are normal. She exhibits no mass. There is no tenderness. There is no rebound and no guarding.  Abdominal obesity without masses or organ enlargement or tenderness  Musculoskeletal: Normal range of  motion. She exhibits no edema or tenderness.  Lymphadenopathy:    She has no cervical adenopathy.  Neurological: She is alert and oriented to person, place, and time. She has normal reflexes. No cranial nerve deficit.  Skin: Skin is warm and dry. No rash noted.  Psychiatric: She has a normal mood and affect. Her behavior is normal. Judgment and thought content normal.  Nursing note and vitals reviewed.  BP 139/80 mmHg  Pulse 96  Temp(Src) 97.5 F (36.4 C) (Oral)  Ht 5' (1.524 m)  Wt 156 lb (70.761 kg)  BMI 30.47 kg/m2  [facility] reading (PRIMARY) by  Dr.Chelsey Kimberley-chest x-ray-- the heart is enlarged and there is thoracic aortic atherosclerosis                                     Assessment & Plan:   1. Hyperlipemia -She should continue with pravastatin pending results of lipid panel - NMR, lipoprofile - DG Chest 2 View; Future - CBC with Differential/Platelet  2. Essential hypertension -The blood pressure is good today and she will continue with current treatment as well as watch  sodium intake - BMP8+EGFR - Hepatic function panel - DG Chest 2 View; Future - CBC with Differential/Platelet  3. Chronic atrial fibrillation -She will continue with her Coumadin and will follow-up regularly with the cardiologist - DG Chest 2 View; Future - CBC with Differential/Platelet  4. Vitamin D deficiency -Continue current treatment pending results of lab work - Vit D  25 hydroxy (rtn osteoporosis monitoring) - CBC with Differential/Platelet  5. Pre-diabetes -She will continue with as aggressive therapeutic lifestyle changes as possible which include diet and exercise and weight control - BMP8+EGFR - CBC with Differential/Platelet  6. Chronic atrial fibrillation -Old with cardiology as planned and remain on Coumadin - POCT INR - CBC with Differential/Platelet  7. Thoracic aortic atherosclerosis  Patient Instructions                       Medicare Annual Wellness Visit  [facility]  and the medical providers at [facility] strive to bring you the best medical care.  In doing so we not only want to address your current medical conditions and concerns but also to detect new conditions early and prevent illness, disease and health-related problems.    Medicare offers a yearly Wellness Visit which allows our clinical staff to assess your need for preventative services including immunizations, lifestyle education, counseling to decrease risk of preventable diseases and screening for fall risk and other medical concerns.    This visit is provided free of charge (no copay) for all Medicare recipients. The clinical pharmacists at [facility] have begun to conduct these Wellness Visits which will also include a thorough review of all your medications.    As you primary medical provider recommend that you make an appointment for your Annual Wellness Visit if you have not done so already this year.  You may set up this appointment before you leave today or you may call back ([phone]) and schedule an  appointment.  Please make sure when you call that you mention that you are scheduling your Annual Wellness Visit with the clinical pharmacist so that the appointment may be made for the proper length of time.     Continue current medications. Continue good therapeutic lifestyle changes which include good diet and exercise. Fall precautions discussed with patient. If an FOBT was given today- please return it to our front desk. If you are over 95 years old - you may need Prevnar 58 or the adult Pneumonia vaccine.  **Flu shots will be available soon--- please call and schedule a FLU-CLINIC appointment**  After your visit with [place] today you will receive a survey in the mail or online from [employer] regarding your care with [place]. Please take a moment to fill this out. Your feedback is very important to [place] as you can help [place] better understand your  patient needs as well as improve your experience and satisfaction. WE CARE ABOUT YOU!!!   **Please join [place] SEPT.[email or web address], 2016 from 5:00 to 7:00pm for our OPEN HOUSE! Come out and meet our NEW providers**  stay active and for the remainder of the summer drink plenty of fluids and stay well hydrated  We will call you with the results of your lab work as soon as they become available as well as the chest x-ray results   Arrie Senate MD

## 2015-05-04 LAB — CBC WITH DIFFERENTIAL/PLATELET
BASOS: 1 %
Basophils Absolute: 0 10*3/uL (ref 0.0–0.2)
EOS (ABSOLUTE): 0.2 10*3/uL (ref 0.0–0.4)
EOS: 3 %
HEMATOCRIT: 42.3 % (ref 34.0–46.6)
Hemoglobin: 12.8 g/dL (ref 11.1–15.9)
IMMATURE GRANS (ABS): 0 10*3/uL (ref 0.0–0.1)
Immature Granulocytes: 0 %
Lymphocytes Absolute: 1.5 10*3/uL (ref 0.7–3.1)
Lymphs: 19 %
MCH: 25.1 pg — ABNORMAL LOW (ref 26.6–33.0)
MCHC: 30.3 g/dL — ABNORMAL LOW (ref 31.5–35.7)
MCV: 83 fL (ref 79–97)
Monocytes Absolute: 0.7 10*3/uL (ref 0.1–0.9)
Monocytes: 9 %
Neutrophils Absolute: 5.6 10*3/uL (ref 1.4–7.0)
Neutrophils: 68 %
PLATELETS: 287 10*3/uL (ref 150–379)
RBC: 5.09 x10E6/uL (ref 3.77–5.28)
RDW: 16.1 % — ABNORMAL HIGH (ref 12.3–15.4)
WBC: 8 10*3/uL (ref 3.4–10.8)

## 2015-05-04 LAB — NMR, LIPOPROFILE
Cholesterol: 131 mg/dL (ref 100–199)
HDL Cholesterol by NMR: 42 mg/dL (ref >39)
HDL PARTICLE NUMBER: 31.6 umol/L (ref >=30.5)
LDL Particle Number: 874 nmol/L (ref <1000)
LDL SIZE: 21 nm (ref >20.5)
LDL-C: 67 mg/dL (ref 0–99)
LP-IR SCORE: 62 — AB (ref <=45)
Small LDL Particle Number: 254 nmol/L (ref <=527)
Triglycerides by NMR: 112 mg/dL (ref 0–149)

## 2015-05-04 LAB — SPECIMEN STATUS REPORT

## 2015-05-05 LAB — HEPATIC FUNCTION PANEL
ALK PHOS: 81 IU/L (ref 39–117)
ALT: 10 IU/L (ref 0–32)
AST: 20 IU/L (ref 0–40)
Albumin: 4.1 g/dL (ref 3.5–4.8)
Bilirubin Total: 0.4 mg/dL (ref 0.0–1.2)
Bilirubin, Direct: 0.15 mg/dL (ref 0.00–0.40)
Total Protein: 7.1 g/dL (ref 6.0–8.5)

## 2015-05-05 LAB — NMR, LIPOPROFILE

## 2015-05-05 LAB — BMP8+EGFR
BUN / CREAT RATIO: 11 (ref 11–26)
BUN: 6 mg/dL — ABNORMAL LOW (ref 8–27)
CO2: 24 mmol/L (ref 18–29)
CREATININE: 0.57 mg/dL (ref 0.57–1.00)
Calcium: 9.5 mg/dL (ref 8.7–10.3)
Chloride: 100 mmol/L (ref 97–108)
GFR, EST AFRICAN AMERICAN: 102 mL/min/{1.73_m2} (ref >59)
GFR, EST NON AFRICAN AMERICAN: 88 mL/min/{1.73_m2} (ref >59)
Glucose: 108 mg/dL — ABNORMAL HIGH (ref 65–99)
Potassium: 4.3 mmol/L (ref 3.5–5.2)
SODIUM: 142 mmol/L (ref 134–144)

## 2015-05-05 LAB — LIPID PANEL

## 2015-05-05 LAB — VITAMIN D 25 HYDROXY (VIT D DEFICIENCY, FRACTURES): Vit D, 25-Hydroxy: 26.1 ng/mL — ABNORMAL LOW (ref 30.0–100.0)

## 2015-05-16 ENCOUNTER — Encounter: Payer: Self-pay | Admitting: Internal Medicine

## 2015-05-24 DIAGNOSIS — B351 Tinea unguium: Secondary | ICD-10-CM | POA: Diagnosis not present

## 2015-05-24 DIAGNOSIS — I70203 Unspecified atherosclerosis of native arteries of extremities, bilateral legs: Secondary | ICD-10-CM | POA: Diagnosis not present

## 2015-05-24 DIAGNOSIS — L84 Corns and callosities: Secondary | ICD-10-CM | POA: Diagnosis not present

## 2015-06-08 ENCOUNTER — Ambulatory Visit (INDEPENDENT_AMBULATORY_CARE_PROVIDER_SITE_OTHER): Payer: Medicare Other | Admitting: Pharmacist

## 2015-06-08 DIAGNOSIS — Z23 Encounter for immunization: Secondary | ICD-10-CM | POA: Diagnosis not present

## 2015-06-08 DIAGNOSIS — I4891 Unspecified atrial fibrillation: Secondary | ICD-10-CM | POA: Diagnosis not present

## 2015-06-08 LAB — POCT INR: INR: 2.6

## 2015-06-08 NOTE — Patient Instructions (Signed)
Anticoagulation Dose Instructions as of 06/08/2015      Dorene Grebe Tue Wed Thu Fri Sat   New Dose 2.5 mg 1.25 mg 2.5 mg 2.5 mg 2.5 mg 1.25 mg 2.5 mg    Description        Continue warfarin dose of 2.5mg  - take 1/2 tablet on mondays and fridays.  Take 1 tablet all other days.     INR was 2.6 today    Calcium & Vitamin D: The Facts  Why is calcium and vitamin D consumption important? Calcium: . Most Americans do not consume adequate amounts of calcium! Calcium is required for proper muscle function, nerve communication, bone support, and many other functions in the body.  . The body uses bones as a source of calcium. Bones 'remodel' themselves continuously - the body constantly breaks bone down to release calcium and rebuilds bones by replacing calcium in the bone later.  . As we get older, the rate of bone breakdown occurs faster than bone rebuilding which could lead to osteopenia, osteoporosis, and possible fractures.   Vitamin D: . People naturally make vitamin D in the body when sunlight hits the skin and triggers a process that leads to vitamin D production. This natural vitamin D production requires about 10-15 minutes of sun exposure on the hands, arms, and face at least 2-3 times per week. However, due to decreased sun exposure and the use of sunscreen, most people will need to get additional vitamin D from foods or supplements. Your doctor can measure your body's vitamin D level through a simple blood test to determine your daily vitamin D needs.  . Vitamin D is used to help the body absorb calcium, maintain bone health, help the immune system, and reduce inflammation. It also plays a role in muscle performance, balance and risk of falling.  . Vitamin D deficiency can lead to osteomalacia or softening of the bones, bone pain, and muscle weakness.   The recommended daily allowance of Calcium and Vitamin D varies for different age groups. Age group Calcium (mg) Vitamin D (IU)  Females  and Males: Age 24-50 1000 mg 600 IU  Females: Age 47- 73 1200 mg 600 IU  Males: Age 65-70 1000 mg 600 IU  Females and Males: Age 11+ 1200 mg 800 IU  Pregnant/lactating Females age 74-50 1000 mg 600 IU   How much Calcium do you get in your diet? Calcium Intake # of servings per day  Total calcium (mg)  Skim milk, 2% milk (1 cup) _________ x 300 mg   Yogurt (1 small container) _________ x 200 mg   Cheese (1oz) _________ x 200 mg   Cottage Cheese (1 cup)             ________ x 150 mg   Almond milk (1 cup) _________ x 450 mg   Fortified Orange Juice (1 cup) _________ x 300 mg   Broccoli or spinach ( 1 cup) _________ x 100 mg   Salmon (3 oz) _________ x 150 mg    Almonds (1/4 cup) _______ x 90 mg      How do we get Calcium and Vitamin D in our diet? Calcium: . Obtaining calcium from the diet is the most preferred way to reach the recommended daily goal. If this goal is not reached through diet, calcium supplements are available.  . Calcium is found in many foods including: dairy products, dark leafy vegetables (like broccoli, kale, and spinach), fish, and fortified products like juices and  cereals.  . The food label will have a %DV (percent daily value) listed showing the amount of calcium per serving. To determine the total mg per serving, simply replace the % with zero (0).  For example, Almond Breeze almond milk contains 45% DV of calcium or 450mg  per 1 cup.  . You can increase the amount of calcium in your diet by using more calcium products in your daily meals. Use yogurt and fruit to make smoothies or use yogurt to top baked potatoes or make whipped potatoes. Sprinkle low fat cheese onto salads or into egg white omelets. You can even add non-fat dry milk powder (300mg  calcium per 1/3 cup) to hot cereals, meat loaf, soups, or potatoes.  . Calcium supplements come in many forms including tablets, chewables, and gummies. Be sure to read the label to determine the correct number of tablets  per serving and whether or not to take the supplement with food.  . Calcium carbonate products (Oscal, Caltrate, and Viactiv) are generally better absorbed when taken with food while calcium citrate products like Citracal can be taken with or without food.  . The body can only absorb about 600 mg of calcium at one time. It is recommended to take calcium supplements in small amounts several times per day.  However, taking it all at once is better than not taking it at all. . Increasing your intake of calcium is essential for bone health, but may also lead to some side effects like constipation, increased gas, bloating or abdominal cramping. To help reduce these side effects, start with 1 tablet per day and slowly increase your intake of the supplement to the recommended doses. It is also recommended that you drink plenty of water each day. Vitamin D: . Very few foods naturally contain vitamin D. However, it is found in saltwater fish (like tuna, salmon and mackerel), beef liver, egg yolks, cheese and vitamin D fortified foods (like yogurt, cereals, orange juice and milk) . The amount of vitamin D in each food or product is listed as %DV on the product label. To determine the total amount of vitamin D per serving, drop the % sign and multiply the number by 4. For example, 1 cup of Almond Breeze almond milk contains 25% DV vitamin D or 100 IU per serving (25 x 4 =100). . Vitamin D is also found in multivitamins and supplements and may be listed as ergocalciferol (vitamin D2) or cholecalciferol (vitamin D3). Each of these forms of vitamin D are equivalent and the daily recommended intake will vary based on your age and the vitamin D levels in your body. Follow your doctor's recommendation for vitamin D intake.

## 2015-07-05 DIAGNOSIS — H25813 Combined forms of age-related cataract, bilateral: Secondary | ICD-10-CM | POA: Diagnosis not present

## 2015-07-19 DIAGNOSIS — H2511 Age-related nuclear cataract, right eye: Secondary | ICD-10-CM | POA: Diagnosis not present

## 2015-07-19 DIAGNOSIS — H25011 Cortical age-related cataract, right eye: Secondary | ICD-10-CM | POA: Diagnosis not present

## 2015-07-19 DIAGNOSIS — H25012 Cortical age-related cataract, left eye: Secondary | ICD-10-CM | POA: Diagnosis not present

## 2015-07-19 DIAGNOSIS — H2512 Age-related nuclear cataract, left eye: Secondary | ICD-10-CM | POA: Diagnosis not present

## 2015-07-19 DIAGNOSIS — H40003 Preglaucoma, unspecified, bilateral: Secondary | ICD-10-CM | POA: Diagnosis not present

## 2015-07-19 DIAGNOSIS — H353130 Nonexudative age-related macular degeneration, bilateral, stage unspecified: Secondary | ICD-10-CM | POA: Diagnosis not present

## 2015-07-19 DIAGNOSIS — H2513 Age-related nuclear cataract, bilateral: Secondary | ICD-10-CM | POA: Diagnosis not present

## 2015-07-25 ENCOUNTER — Ambulatory Visit (INDEPENDENT_AMBULATORY_CARE_PROVIDER_SITE_OTHER): Payer: Medicare Other | Admitting: Pharmacist

## 2015-07-25 DIAGNOSIS — I4891 Unspecified atrial fibrillation: Secondary | ICD-10-CM

## 2015-07-25 LAB — POCT INR: INR: 2.9

## 2015-07-25 NOTE — Patient Instructions (Signed)
Anticoagulation Dose Instructions as of 07/25/2015      Dorene Grebe Tue Wed Thu Fri Sat   New Dose 2.5 mg 1.25 mg 2.5 mg 2.5 mg 2.5 mg 1.25 mg 2.5 mg    Description        Continue warfarin dose of 2.5mg  - take 1/2 tablet on mondays and fridays.  Take 1 tablet all other days.     INR was 2.9 today

## 2015-08-22 DIAGNOSIS — Z7901 Long term (current) use of anticoagulants: Secondary | ICD-10-CM | POA: Diagnosis not present

## 2015-08-22 DIAGNOSIS — Z79899 Other long term (current) drug therapy: Secondary | ICD-10-CM | POA: Diagnosis not present

## 2015-08-22 DIAGNOSIS — E785 Hyperlipidemia, unspecified: Secondary | ICD-10-CM | POA: Diagnosis not present

## 2015-08-22 DIAGNOSIS — Z880 Allergy status to penicillin: Secondary | ICD-10-CM | POA: Diagnosis not present

## 2015-08-22 DIAGNOSIS — Z90710 Acquired absence of both cervix and uterus: Secondary | ICD-10-CM | POA: Diagnosis not present

## 2015-08-22 DIAGNOSIS — H25811 Combined forms of age-related cataract, right eye: Secondary | ICD-10-CM | POA: Diagnosis not present

## 2015-08-22 DIAGNOSIS — H25011 Cortical age-related cataract, right eye: Secondary | ICD-10-CM | POA: Diagnosis not present

## 2015-08-22 DIAGNOSIS — I10 Essential (primary) hypertension: Secondary | ICD-10-CM | POA: Diagnosis not present

## 2015-08-22 DIAGNOSIS — Z881 Allergy status to other antibiotic agents status: Secondary | ICD-10-CM | POA: Diagnosis not present

## 2015-08-22 DIAGNOSIS — H3530 Unspecified macular degeneration: Secondary | ICD-10-CM | POA: Diagnosis not present

## 2015-08-22 DIAGNOSIS — I4891 Unspecified atrial fibrillation: Secondary | ICD-10-CM | POA: Diagnosis not present

## 2015-08-22 DIAGNOSIS — H2511 Age-related nuclear cataract, right eye: Secondary | ICD-10-CM | POA: Diagnosis not present

## 2015-09-04 ENCOUNTER — Encounter (HOSPITAL_COMMUNITY): Payer: Self-pay | Admitting: Emergency Medicine

## 2015-09-04 ENCOUNTER — Emergency Department (HOSPITAL_COMMUNITY): Payer: Medicare Other

## 2015-09-04 ENCOUNTER — Inpatient Hospital Stay (HOSPITAL_COMMUNITY)
Admission: EM | Admit: 2015-09-04 | Discharge: 2015-09-05 | DRG: 312 | Disposition: A | Discharge status: Home / Self Care | Payer: Medicare Other | Attending: Internal Medicine | Admitting: Internal Medicine

## 2015-09-04 DIAGNOSIS — M810 Age-related osteoporosis without current pathological fracture: Secondary | ICD-10-CM | POA: Diagnosis not present

## 2015-09-04 DIAGNOSIS — E559 Vitamin D deficiency, unspecified: Secondary | ICD-10-CM | POA: Diagnosis not present

## 2015-09-04 DIAGNOSIS — Z7901 Long term (current) use of anticoagulants: Secondary | ICD-10-CM | POA: Diagnosis not present

## 2015-09-04 DIAGNOSIS — J069 Acute upper respiratory infection, unspecified: Secondary | ICD-10-CM | POA: Diagnosis not present

## 2015-09-04 DIAGNOSIS — R7303 Prediabetes: Secondary | ICD-10-CM | POA: Diagnosis present

## 2015-09-04 DIAGNOSIS — Z6828 Body mass index (BMI) 28.0-28.9, adult: Secondary | ICD-10-CM | POA: Diagnosis not present

## 2015-09-04 DIAGNOSIS — Z049 Encounter for examination and observation for unspecified reason: Secondary | ICD-10-CM | POA: Diagnosis not present

## 2015-09-04 DIAGNOSIS — J209 Acute bronchitis, unspecified: Secondary | ICD-10-CM | POA: Diagnosis not present

## 2015-09-04 DIAGNOSIS — E876 Hypokalemia: Secondary | ICD-10-CM | POA: Diagnosis present

## 2015-09-04 DIAGNOSIS — S0990XA Unspecified injury of head, initial encounter: Secondary | ICD-10-CM | POA: Diagnosis not present

## 2015-09-04 DIAGNOSIS — Z8249 Family history of ischemic heart disease and other diseases of the circulatory system: Secondary | ICD-10-CM | POA: Diagnosis not present

## 2015-09-04 DIAGNOSIS — E669 Obesity, unspecified: Secondary | ICD-10-CM | POA: Diagnosis not present

## 2015-09-04 DIAGNOSIS — I10 Essential (primary) hypertension: Secondary | ICD-10-CM | POA: Diagnosis present

## 2015-09-04 DIAGNOSIS — Z881 Allergy status to other antibiotic agents status: Secondary | ICD-10-CM

## 2015-09-04 DIAGNOSIS — Z880 Allergy status to penicillin: Secondary | ICD-10-CM | POA: Diagnosis not present

## 2015-09-04 DIAGNOSIS — E785 Hyperlipidemia, unspecified: Secondary | ICD-10-CM | POA: Diagnosis present

## 2015-09-04 DIAGNOSIS — E860 Dehydration: Secondary | ICD-10-CM | POA: Diagnosis not present

## 2015-09-04 DIAGNOSIS — R55 Syncope and collapse: Secondary | ICD-10-CM | POA: Diagnosis present

## 2015-09-04 DIAGNOSIS — S199XXA Unspecified injury of neck, initial encounter: Secondary | ICD-10-CM | POA: Diagnosis not present

## 2015-09-04 DIAGNOSIS — R531 Weakness: Secondary | ICD-10-CM | POA: Diagnosis not present

## 2015-09-04 DIAGNOSIS — I4891 Unspecified atrial fibrillation: Secondary | ICD-10-CM | POA: Diagnosis present

## 2015-09-04 DIAGNOSIS — J111 Influenza due to unidentified influenza virus with other respiratory manifestations: Secondary | ICD-10-CM | POA: Diagnosis not present

## 2015-09-04 DIAGNOSIS — I951 Orthostatic hypotension: Secondary | ICD-10-CM | POA: Diagnosis not present

## 2015-09-04 DIAGNOSIS — Z90710 Acquired absence of both cervix and uterus: Secondary | ICD-10-CM

## 2015-09-04 DIAGNOSIS — R6889 Other general symptoms and signs: Secondary | ICD-10-CM

## 2015-09-04 HISTORY — PX: EYE SURGERY: SHX253

## 2015-09-04 LAB — CBC WITH DIFFERENTIAL/PLATELET
BASOS ABS: 0 10*3/uL (ref 0.0–0.1)
Basophils Relative: 0 %
EOS PCT: 1 %
Eosinophils Absolute: 0.1 10*3/uL (ref 0.0–0.7)
HCT: 40 % (ref 36.0–46.0)
Hemoglobin: 12.3 g/dL (ref 12.0–15.0)
LYMPHS PCT: 11 %
Lymphs Abs: 1.2 10*3/uL (ref 0.7–4.0)
MCH: 25.4 pg — ABNORMAL LOW (ref 26.0–34.0)
MCHC: 30.8 g/dL (ref 30.0–36.0)
MCV: 82.6 fL (ref 78.0–100.0)
Monocytes Absolute: 0.9 10*3/uL (ref 0.1–1.0)
Monocytes Relative: 7 %
Neutro Abs: 9.5 10*3/uL — ABNORMAL HIGH (ref 1.7–7.7)
Neutrophils Relative %: 81 %
PLATELETS: 216 10*3/uL (ref 150–400)
RBC: 4.84 MIL/uL (ref 3.87–5.11)
RDW: 16.7 % — ABNORMAL HIGH (ref 11.5–15.5)
WBC: 11.7 10*3/uL — AB (ref 4.0–10.5)

## 2015-09-04 LAB — URINALYSIS, ROUTINE W REFLEX MICROSCOPIC
Bilirubin Urine: NEGATIVE
Glucose, UA: NEGATIVE mg/dL
KETONES UR: NEGATIVE mg/dL
NITRITE: NEGATIVE
PROTEIN: NEGATIVE mg/dL
Specific Gravity, Urine: 1.009 (ref 1.005–1.030)
pH: 7 (ref 5.0–8.0)

## 2015-09-04 LAB — I-STAT CHEM 8, ED
BUN: 14 mg/dL (ref 6–20)
CHLORIDE: 99 mmol/L — AB (ref 101–111)
Calcium, Ion: 1.1 mmol/L — ABNORMAL LOW (ref 1.13–1.30)
Creatinine, Ser: 0.9 mg/dL (ref 0.44–1.00)
Glucose, Bld: 156 mg/dL — ABNORMAL HIGH (ref 65–99)
HEMATOCRIT: 44 % (ref 36.0–46.0)
Hemoglobin: 15 g/dL (ref 12.0–15.0)
POTASSIUM: 3.2 mmol/L — AB (ref 3.5–5.1)
SODIUM: 139 mmol/L (ref 135–145)
TCO2: 27 mmol/L (ref 0–100)

## 2015-09-04 LAB — PROTIME-INR
INR: 2.05 — AB (ref 0.00–1.49)
Prothrombin Time: 23 seconds — ABNORMAL HIGH (ref 11.6–15.2)

## 2015-09-04 LAB — GLUCOSE, CAPILLARY: Glucose-Capillary: 134 mg/dL — ABNORMAL HIGH (ref 65–99)

## 2015-09-04 LAB — CBG MONITORING, ED: GLUCOSE-CAPILLARY: 146 mg/dL — AB (ref 65–99)

## 2015-09-04 LAB — URINE MICROSCOPIC-ADD ON: RBC / HPF: NONE SEEN RBC/hpf (ref 0–5)

## 2015-09-04 LAB — I-STAT TROPONIN, ED: TROPONIN I, POC: 0 ng/mL (ref 0.00–0.08)

## 2015-09-04 MED ORDER — ALBUTEROL SULFATE (2.5 MG/3ML) 0.083% IN NEBU: 2.5000 mg | INHALATION_SOLUTION | RESPIRATORY_TRACT | Status: DC | PRN

## 2015-09-04 MED ORDER — ONDANSETRON HCL 4 MG PO TABS: 4.0000 mg | ORAL_TABLET | Freq: Four times a day (QID) | ORAL | Status: DC | PRN

## 2015-09-04 MED ORDER — INSULIN ASPART 100 UNIT/ML ~~LOC~~ SOLN: 0.0000 [IU] | Freq: Three times a day (TID) | SUBCUTANEOUS | Status: DC

## 2015-09-04 MED ORDER — GUAIFENESIN-DM 100-10 MG/5ML PO SYRP: 5.0000 mL | ORAL_SOLUTION | ORAL | Status: DC | PRN

## 2015-09-04 MED ORDER — PREDNISOLONE ACETATE 1 % OP SUSP
1.0000 [drp] | Freq: Two times a day (BID) | OPHTHALMIC | Status: DC
  Administered 2015-09-04: 1 [drp] via OPHTHALMIC
  Filled 2015-09-04: qty 1

## 2015-09-04 MED ORDER — ACETAMINOPHEN 325 MG PO TABS
650.0000 mg | ORAL_TABLET | Freq: Four times a day (QID) | ORAL | Status: DC | PRN
Start: 2015-09-04 — End: 2015-09-05

## 2015-09-04 MED ORDER — PRAVASTATIN SODIUM 80 MG PO TABS
80.0000 mg | ORAL_TABLET | Freq: Every day | ORAL | Status: DC
  Filled 2015-09-04: qty 1

## 2015-09-04 MED ORDER — PREDNISONE 20 MG PO TABS
40.0000 mg | ORAL_TABLET | Freq: Every day | ORAL | Status: DC
  Administered 2015-09-05: 40 mg via ORAL
  Filled 2015-09-04: qty 2

## 2015-09-04 MED ORDER — POTASSIUM CHLORIDE IN NACL 40-0.9 MEQ/L-% IV SOLN
INTRAVENOUS | Status: DC
  Administered 2015-09-04: 100 mL/h via INTRAVENOUS
  Filled 2015-09-04 (×4): qty 1000

## 2015-09-04 MED ORDER — SODIUM CHLORIDE 0.9 % IV SOLN
1000.0000 mL | Freq: Once | INTRAVENOUS | Status: AC
  Administered 2015-09-04: 1000 mL via INTRAVENOUS

## 2015-09-04 MED ORDER — DOXYCYCLINE HYCLATE 100 MG PO TABS
100.0000 mg | ORAL_TABLET | Freq: Two times a day (BID) | ORAL | Status: DC
  Administered 2015-09-04: 100 mg via ORAL
  Filled 2015-09-04 (×2): qty 1

## 2015-09-04 MED ORDER — ACETAMINOPHEN 650 MG RE SUPP: 650.0000 mg | Freq: Four times a day (QID) | RECTAL | Status: DC | PRN

## 2015-09-04 MED ORDER — ALBUTEROL SULFATE (2.5 MG/3ML) 0.083% IN NEBU
2.5000 mg | INHALATION_SOLUTION | Freq: Four times a day (QID) | RESPIRATORY_TRACT | Status: DC
  Administered 2015-09-04 – 2015-09-05 (×3): 2.5 mg via RESPIRATORY_TRACT
  Filled 2015-09-04 (×3): qty 3

## 2015-09-04 MED ORDER — SODIUM CHLORIDE 0.9 % IV SOLN: 1000.0000 mL | INTRAVENOUS | Status: DC

## 2015-09-04 MED ORDER — EZETIMIBE 10 MG PO TABS
10.0000 mg | ORAL_TABLET | Freq: Every day | ORAL | Status: DC
  Filled 2015-09-04: qty 1

## 2015-09-04 MED ORDER — SODIUM CHLORIDE 0.9 % IV SOLN
1000.0000 mL | Freq: Once | INTRAVENOUS | Status: AC
Start: 2015-09-04 — End: 2015-09-04
  Administered 2015-09-04: 1000 mL via INTRAVENOUS

## 2015-09-04 MED ORDER — ONDANSETRON HCL 4 MG/2ML IJ SOLN: 4.0000 mg | Freq: Four times a day (QID) | INTRAMUSCULAR | Status: DC | PRN

## 2015-09-04 MED ORDER — IPRATROPIUM BROMIDE 0.02 % IN SOLN
0.5000 mg | Freq: Once | RESPIRATORY_TRACT | Status: AC
  Administered 2015-09-04: 0.5 mg via RESPIRATORY_TRACT
  Filled 2015-09-04: qty 2.5

## 2015-09-04 MED ORDER — SODIUM CHLORIDE 0.9 % IJ SOLN: 3.0000 mL | Freq: Two times a day (BID) | INTRAMUSCULAR | Status: DC

## 2015-09-04 MED ORDER — ALBUTEROL SULFATE (2.5 MG/3ML) 0.083% IN NEBU
5.0000 mg | INHALATION_SOLUTION | Freq: Once | RESPIRATORY_TRACT | Status: AC
  Administered 2015-09-04: 5 mg via RESPIRATORY_TRACT
  Filled 2015-09-04: qty 6

## 2015-09-04 MED ORDER — GUAIFENESIN ER 600 MG PO TB12
600.0000 mg | ORAL_TABLET | Freq: Two times a day (BID) | ORAL | Status: DC
  Administered 2015-09-04: 600 mg via ORAL
  Filled 2015-09-04 (×2): qty 1

## 2015-09-04 NOTE — H&P (Addendum)
[facility] History and Physical  Kari [name] [id number] DOB: 03-Aug-1934 DOA: 09/04/2015  Referring physician: DrMesner PCP: Redge Gainer, MD   Chief Complaint:  Syncope  HPI:  82 year old female with history of A. fib on Coumadin, hypertension, hyperlipidemia, prediabetes, obesity who was brought in by EMS after she had a syncopal episode at home. Patient reports having URI symptoms with chest congestion for the past 2 days with poor by mouth intake. Also reported generalized weakness. This morning she was sitting on a chair when she feels lightheaded and that she was going to pass out. She called her husband and before he reached out for help she slumped on her chair and landed on the floor. Side of her head hit the floor and patient was unconscious for 10-20 seconds. When she regained consciousness she was not confused or had blurred vision. No witnessed seizure, bowel or urinary incontinence. She denies any sick contacts, recent travel. Denies any headache, fever, chills, nausea, vomiting, chest pain, palpitation, abdominal pain, bowel or urinary symptoms. Denies any change in weight but has had poor appetite for the past 2 days. Reports having flu vaccine this year.  In the ED Patient was found to be hypotensive with blood pressure of 86/74. Orthostasis was negative but the blood pressure remained low. She was given 2 L IV normal saline bolus and started on maintenance normal saline at 1 25 mL per hour and blood pressure improved. She was given albuterol and Atrovent nebs as she was very wheezy. EKG showed A. fib and was unremarkable. Blood work done showed WC 11.7, normal hemoglobin and platelets. Chemistry showed potassium of 3.2 with normal renal function. INR of 2.05. Glucose of 156. Given her flulike symptoms of flu PCR was sent from the ED.  Head CT and CT of the cervical spine done was negative for acute injury. Chest x-ray was unremarkable. Hospitalist admission requested  to observe on telemetry.  Review of Systems:  Constitutional: Denies fever, chills, diaphoresis, appetite change and fatigue.  HEENT: Congestion, nasal stuffiness, productive cough Denies photophobia, visual or hearing symptoms, Respiratory:  SOB+, cough, denies chest tightness,  and wheezing.   Cardiovascular: Denies chest pain, palpitations and leg swelling.  Gastrointestinal: Denies nausea, vomiting, abdominal pain, diarrhea, constipation, blood in stool and abdominal distention.  Genitourinary: Denies dysuria, urgency, frequency, hematuria, flank pain and difficulty urinating.  Endocrine: Denies: hot or cold intolerance,  polyuria, polydipsia. Musculoskeletal: Denies myalgias, back pain, joint swelling, arthralgias and gait problem.  Skin: Denies pallor, rash and wound.  Neurological: Syncope++,  Denies dizziness, seizures,  weakness, light-headedness, numbness and headaches.  Hematological: Denies adenopathy.  Psychiatric/Behavioral: Denies confusion  Past Medical History  Diagnosis Date  . Obesity   . Symptomatic menopausal or female climacteric states   . Kyphosis   . Gallstones   . Adenomatous polyps   . AF (atrial fibrillation) ([place])   . Hyperlipidemia     x5 years  . Hypertension     x5 years  . Hx of adenomatous colonic polyps   . Osteoporosis   . Metabolic syndrome X   . Vitamin D deficiency    Past Surgical History  Procedure Laterality Date  . Vaginal hysterectomy      total  . Bladder suspension    . Colonoscopy  multiple   Social History:  reports that she has never smoked. She has never used smokeless tobacco. She reports that she does not drink alcohol or use illicit drugs.  Allergies  Allergen Reactions  .  Erythromycin Swelling and Other (See Comments)    Reaction unknown  . Penicillins Other (See Comments)    Reaction unknown  Has patient had a PCN reaction causing immediate rash, facial/tongue/throat swelling, SOB or lightheadedness with  hypotension: unknown Has patient had a PCN reaction causing severe rash involving mucus membranes or skin necrosis: unknown Has patient had a PCN reaction that required hospitalization : unknown Has patient had a PCN reaction occurring within the last 10 years: no If all of the above answers are "NO", then may proceed with Cephalosporin use.     Family History  Problem Relation Age of Onset  . Heart attack Mother   . Stroke Mother   . Heart attack Father   . Alcohol abuse Father   . Heart attack Other   . Stroke Other   . Breast cancer Other   . Colon cancer Neg Hx   . Stroke Brother   . Heart attack Brother   . Depression Brother     suicide    Prior to Admission medications   Medication Sig Start Date End Date Taking? Authorizing Provider  amLODipine-valsartan (EXFORGE) 5-320 MG per tablet TAKE (1/2) TABLET DAILY. 12/20/14  Yes Chipper Herb, MD  atenolol (TENORMIN) 50 MG tablet TAKE (1) TABLET TWICE A DAY. 12/20/14  Yes Chipper Herb, MD  captopril (CAPOTEN) 50 MG tablet TAKE (1) TABLET TWICE A DAY FOR HIGH BLOOD PRESSURE. 12/20/14  Yes Chipper Herb, MD  ezetimibe (ZETIA) 10 MG tablet TAKE 1 TABLET ONCE DAILY FOR CHOLESTEROL 12/20/14  Yes Chipper Herb, MD  pravastatin (PRAVACHOL) 80 MG tablet TAKE 1 TABLET ONCE DAILY FOR CHOLESTEROL 12/20/14  Yes Chipper Herb, MD  prednisoLONE acetate (PRED FORTE) 1 % ophthalmic suspension Place 1 drop into the right eye 2 (two) times daily.  08/22/15 09/12/15 Yes Historical Provider, MD  Vitamin D, Ergocalciferol, (DRISDOL) 50000 UNITS CAPS capsule Take 1 capsule (50,000 Units total) by mouth every 7 (seven) days. 12/22/14  Yes Chipper Herb, MD  warfarin (COUMADIN) 2.5 MG tablet TAKE 1 TABLET ONCE DAILY OR AS DIRECTED Patient taking differently: Take 1.25-2.5 mg by mouth daily. Take 2.5mg  5 days a week except on Monday and Friday take 1.25mg . 12/20/14  Yes Chipper Herb, MD     Physical Exam:  Filed Vitals:   09/04/15 1547 09/04/15 1554  09/04/15 1556 09/04/15 1734  BP:  86/74  132/75  Pulse:  77  118  Temp:  98.3 F (36.8 C)    TempSrc:  Oral    Resp:  25  22  SpO2: 95% 98% 98% 95%    Constitutional: Vital signs reviewed. Early OB female lying in bed not in distress, appears congested HEENT: no pallor, no icterus, moist oral mucosa, no cervical lymphadenopathy Cardiovascular: RRR, S1 and S2 irregular, no murmurs rub or gallop Chest: Congested with scattered rhonchi Abdominal: Soft. Non-tender, non-distended, bowel sounds are normal,  Ext: warm, no edema Neurological: Alert and oriented, non focal  Labs on Admission:  Basic Metabolic Panel:  Recent Labs Lab 09/04/15 1630  NA 139  K 3.2*  CL 99*  GLUCOSE 156*  BUN 14  CREATININE 0.90   Liver Function Tests: No results for input(s): AST, ALT, ALKPHOS, BILITOT, PROT, ALBUMIN in the last 168 hours. No results for input(s): LIPASE, AMYLASE in the last 168 hours. No results for input(s): AMMONIA in the last 168 hours. CBC:  Recent Labs Lab 09/04/15 1622 09/04/15 1630  WBC 11.7*  --  NEUTROABS 9.5*  --   HGB 12.3 15.0  HCT 40.0 44.0  MCV 82.6  --   PLT 216  --    Cardiac Enzymes: No results for input(s): CKTOTAL, CKMB, CKMBINDEX, TROPONINI in the last 168 hours. BNP: Invalid input(s): POCBNP CBG:  Recent Labs Lab 09/04/15 1601  GLUCAP 146*    Radiological Exams on Admission: Dg Chest 2 View  09/04/2015  CLINICAL DATA:  Syncope. EXAM: CHEST  2 VIEW COMPARISON:  05/03/2015 FINDINGS: Heart size and pulmonary vascularity are normal. No acute infiltrates or effusions. Minimal scarring at the left lung base laterally. Calcification in the thoracic aorta. No acute osseous abnormality. IMPRESSION: No acute disease.  Aortic atherosclerosis. Electronically Signed   By: Lorriane Shire M.D.   On: 09/04/2015 16:59   Ct Head Wo Contrast  09/04/2015  CLINICAL DATA:  82 year old hypertensive female passed out while sitting in chair. Atrial fibrillation on  blood thinner. Initial encounter. EXAM: CT HEAD WITHOUT CONTRAST CT CERVICAL SPINE WITHOUT CONTRAST TECHNIQUE: Multidetector CT imaging of the head and cervical spine was performed following the standard protocol without intravenous contrast. Multiplanar CT image reconstructions of the cervical spine were also generated. COMPARISON:  None. FINDINGS: CT HEAD FINDINGS No skull fracture or intracranial hemorrhage. Prominent small vessel disease type changes. Small infarct left lenticular nucleus of indeterminate age. No CT evidence of large acute infarct. Vascular calcifications. Mild global atrophy without hydrocephalus. No intracranial mass lesion noted on this unenhanced exam. Partial opacification ethmoid sinus air cells with polypoid mucosal thickening maxillary sinuses. Post right lens replacement otherwise orbital structures unremarkable. CT CERVICAL SPINE FINDINGS No cervical spine fracture, malalignment or abnormal prevertebral soft tissue swelling. Cervical spondylotic changes most prominent C5-6. Carotid bifurcation calcifications. No worrisome neck mass or lung apical lesion identified. IMPRESSION: CT HEAD No skull fracture or intracranial hemorrhage. Prominent small vessel disease type changes. Small infarct left lenticular nucleus of indeterminate age. No CT evidence of large acute infarct. Mild global atrophy without hydrocephalus. Partial opacification ethmoid sinus air cells with polypoid mucosal thickening maxillary sinuses. CT CERVICAL SPINE No cervical spine fracture, malalignment or abnormal prevertebral soft tissue swelling. Cervical spondylotic changes most prominent C5-6. Electronically Signed   By: Genia Del M.D.   On: 09/04/2015 17:14   Ct Cervical Spine Wo Contrast  09/04/2015  CLINICAL DATA:  82 year old hypertensive female passed out while sitting in chair. Atrial fibrillation on blood thinner. Initial encounter. EXAM: CT HEAD WITHOUT CONTRAST CT CERVICAL SPINE WITHOUT CONTRAST  TECHNIQUE: Multidetector CT imaging of the head and cervical spine was performed following the standard protocol without intravenous contrast. Multiplanar CT image reconstructions of the cervical spine were also generated. COMPARISON:  None. FINDINGS: CT HEAD FINDINGS No skull fracture or intracranial hemorrhage. Prominent small vessel disease type changes. Small infarct left lenticular nucleus of indeterminate age. No CT evidence of large acute infarct. Vascular calcifications. Mild global atrophy without hydrocephalus. No intracranial mass lesion noted on this unenhanced exam. Partial opacification ethmoid sinus air cells with polypoid mucosal thickening maxillary sinuses. Post right lens replacement otherwise orbital structures unremarkable. CT CERVICAL SPINE FINDINGS No cervical spine fracture, malalignment or abnormal prevertebral soft tissue swelling. Cervical spondylotic changes most prominent C5-6. Carotid bifurcation calcifications. No worrisome neck mass or lung apical lesion identified. IMPRESSION: CT HEAD No skull fracture or intracranial hemorrhage. Prominent small vessel disease type changes. Small infarct left lenticular nucleus of indeterminate age. No CT evidence of large acute infarct. Mild global atrophy without hydrocephalus. Partial opacification ethmoid sinus air cells  with polypoid mucosal thickening maxillary sinuses. CT CERVICAL SPINE No cervical spine fracture, malalignment or abnormal prevertebral soft tissue swelling. Cervical spondylotic changes most prominent C5-6. Electronically Signed   By: Genia Del M.D.   On: 09/04/2015 17:14    EKG: A. fib at 88, T-wave inversion in lateral leads which seems unchanged from prior EKG in 2014  Assessment/Plan  Principal problem Orthostatic syncope Admit under observation on telemetry. IV hydration with normal saline with potassium supplement. Patient reports poor by mouth intake for past 2 days and appears to be dehydrated. Repeat  orthostasis in the morning. Hold all blood pressure medications. (Patient on 4 different blood pressure medications, amlodipine, valsartan, captopril and atenolol). -Very low suspicion for neurocardiogenic syncope or PE.  Active Problems: Acute URI symptoms Patient has cough with some whitish phlegm, runny nose and chest congestion for the past 2 days. Chest x-ray unremarkable but patient is wheezy and has congestion on exam. Will place her on scheduled albuterol neb, oral prednisone and empiric oral doxycycline. Flu PCR ordered in the ED. Supportive care with Tylenol and antitussives.     Hypokalemia Replenish    Atrial fibrillation, unspecified Heart rate in 110s up to 120s. Should improve with IV fluids. Holding blood pressure medication due to hypotension  INR therapeutic. Continue Coumadin. Dosing per pharmacy   Hyperlipidemia Continue statin and Zetia.  Prediabetes Will monitor on sensitive sliding scale coverage since patient would be on prednisone.  Diet: Regular  DVT prophylaxis: On warfarin   Code Status: full code Family Communication: Husband and grandson at bedside Disposition Plan: Admit to observation telemetry. Home possibly tomorrow if symptoms improve.  Louellen Molder [facility] Pager [phone]  Total time spent on admission :55 minutes  If 7PM-7AM, please contact night-coverage [email or web address] Password [email or web address] 09/04/2015, 5:59 PM

## 2015-09-04 NOTE — ED Notes (Signed)
Bed: WA20 Expected date: 09/04/15 Expected time: 3:42 PM Means of arrival: Ambulance Comments: Syncopal Episode

## 2015-09-04 NOTE — ED Notes (Signed)
[facility] presents with a 82 yo female from home with a sycopal episode.  Pt was sitting in chair when pt passed out around 1430 today.  [facility] reported pt is in afib and is currently taking warfarin and seeing a cardiologist.  Pt reports having cold and flu-like symptoms for two days with cough and coughing up yellow sputum.  Pt received 250 ml NS from [facility].  BP 92/44; CBG 152

## 2015-09-04 NOTE — ED Provider Notes (Signed)
Medical screening examination/treatment/procedure(s) were conducted as a shared visit with non-physician practitioner(s) and myself.  I personally evaluated the patient during the encounter.  82-year-old female brought in from home secondary to syncope associated with hypotension. Patient felt kind of funny all over with a small amount of abdominal pain INTO her living room and sat in her chair. Shortly afterwards she yelled for her husband and subsequently lost consciousness. EMS arrival patient was alert and oriented with no obvious postictal state. Did have hypotension, significant wheezing throughout all lung fields or here for further evaluation. On my examination patient is alert and oriented 4. She has diffuse wheezing and decreased breath sounds and is still slightly hypotensive. She still complaining of bloating abdominal pain but she has no tenderness or evidence peritonitis. Plan is to rule out emergent causes for her syncope, fluid resuscitate him some breathing treatments and likely admit for further management unless she improves greatly.    EKG Interpretation   Date/Time:  Sunday September 04 2015 15:52:44 EST Ventricular Rate:  88 PR Interval:    QRS Duration: 74 QT Interval:  363 QTC Calculation: 439 R Axis:   87 Text Interpretation:  Atrial fibrillation Borderline right axis deviation  Anteroseptal infarct, old Nonspecific T abnormalities, lateral leads since  last tracing no significant change Confirmed by Sabra Heck  MD, BRIAN ([id number])  on 09/04/2015 3:55:38 PM        Merrily Pew, MD 09/06/15 0030

## 2015-09-04 NOTE — Progress Notes (Signed)
ANTICOAGULATION CONSULT NOTE - Initial Consult  Pharmacy Consult for Warfarin Indication: atrial fibrillation  Allergies  Allergen Reactions  . Erythromycin Swelling and Other (See Comments)    Reaction unknown  . Penicillins Other (See Comments)    Reaction unknown  Has patient had a PCN reaction causing immediate rash, facial/tongue/throat swelling, SOB or lightheadedness with hypotension: unknown Has patient had a PCN reaction causing severe rash involving mucus membranes or skin necrosis: unknown Has patient had a PCN reaction that required hospitalization : unknown Has patient had a PCN reaction occurring within the last 10 years: no If all of the above answers are "NO", then may proceed with Cephalosporin use.    Patient Measurements:   Total body weight: not recorded  Vital Signs: Temp: 98.3 F (36.8 C) (01/01 1909) Temp Source: Oral (01/01 1909) BP: 132/75 mmHg (01/01 1734) Pulse Rate: 110 (01/01 1909)  Labs:  Recent Labs  09/04/15 1622 09/04/15 1630  HGB 12.3 15.0  HCT 40.0 44.0  PLT 216  --   LABPROT 23.0*  --   INR 2.05*  --   CREATININE  --  0.90   CrCl cannot be calculated (Unknown ideal weight.).  Medical History: Past Medical History  Diagnosis Date  . Obesity   . Symptomatic menopausal or female climacteric states   . Kyphosis   . Gallstones   . Adenomatous polyps   . AF (atrial fibrillation) ([place])   . Hyperlipidemia     x5 years  . Hypertension     x5 years  . Hx of adenomatous colonic polyps   . Osteoporosis   . Metabolic syndrome X   . Vitamin D deficiency    Medications:  Scheduled:  . albuterol  2.5 mg Nebulization Q6H  . doxycycline  100 mg Oral Q12H  . [START ON 09/05/2015] ezetimibe  10 mg Oral Daily  . guaiFENesin  600 mg Oral BID  . [START ON 09/05/2015] insulin aspart  0-9 Units Subcutaneous TID WC  . [START ON 09/05/2015] pravastatin  80 mg Oral q1800  . prednisoLONE acetate  1 drop Right Eye BID  . [START ON 09/05/2015]  predniSONE  40 mg Oral Q breakfast  . sodium chloride  3 mL Intravenous Q12H   Assessment: 81 yoF on chronic Warfarin for Afib, syncopal episode at home, to ED via EMS. Admit for work-up. Marland Kitchen Home Warfarin dose 2.5mg  daily exc 1.25mg  on M,F with last dose 1/1 at 0830 am. . INR 1/1 = 2.05  Goal of Therapy:  INR 2-3 Monitor platelets by anticoagulation protocol: Yes   Plan:   No further Warfarin today  Daily PT/INR  Minda Ditto PharmD Pager [phone] 09/04/2015, 8:30 PM

## 2015-09-04 NOTE — ED Provider Notes (Signed)
CSN: [id number]     Arrival date & time 09/04/15  1543 History   First MD Initiated Contact with Patient 09/04/15 1544     No chief complaint on file.    (Consider location/radiation/quality/duration/timing/severity/associated sxs/prior Treatment) HPI   82 year old female with history of atrial fibrillation on Coumadin, hypertension, osteoporosis brought here via EMS for evaluation of a syncopal episode.  Patient report prior to arrival she was sitting in the chair in the kitchen when she felt lightheadedness and felt "some kind of way" she called out to her husband and then had a brief syncopal episode.  She was unsure if she fell back to her chair or onto the floor.  She denies any post-ictal state or having any injury.  She has had cold sxs with persistent productive cough and sneezing for the past 2 days. She also having wheezing, without any prior hx of asthma or COPD. Pt denies having fever chills, headache, neck pain, chest pain, difficulty breathing, abdominal pain, nausea vomiting diarrhea, dysuria, focal numbness or weakness. No recent medication changes.  Past Medical History  Diagnosis Date  . Obesity   . Symptomatic menopausal or female climacteric states   . Kyphosis   . Gallstones   . Adenomatous polyps   . AF (atrial fibrillation)   . Hyperlipidemia     x5 years  . Hypertension     x5 years  . Hx of adenomatous colonic polyps   . Osteoporosis   . Metabolic syndrome X   . Vitamin D deficiency    Past Surgical History  Procedure Laterality Date  . Vaginal hysterectomy      total  . Bladder suspension    . Colonoscopy  multiple   Family History  Problem Relation Age of Onset  . Heart attack Mother   . Stroke Mother   . Heart attack Father   . Alcohol abuse Father   . Heart attack Other   . Stroke Other   . Breast cancer Other   . Colon cancer Neg Hx   . Stroke Brother   . Heart attack Brother   . Depression Brother     suicide   Social History   Substance Use Topics  . Smoking status: Never Smoker   . Smokeless tobacco: Never Used  . Alcohol Use: No   OB History    No data available     Review of Systems  All other systems reviewed and are negative.     Allergies  Erythromycin and Penicillins  Home Medications   Prior to Admission medications   Medication Sig Start Date End Date Taking? Authorizing Provider  amLODipine-valsartan (EXFORGE) 5-320 MG per tablet TAKE (1/2) TABLET DAILY. 12/20/14   Chipper Herb, MD  atenolol (TENORMIN) 50 MG tablet TAKE (1) TABLET TWICE A DAY. 12/20/14   Chipper Herb, MD  captopril (CAPOTEN) 50 MG tablet TAKE (1) TABLET TWICE A DAY FOR HIGH BLOOD PRESSURE. 12/20/14   Chipper Herb, MD  ezetimibe (ZETIA) 10 MG tablet TAKE 1 TABLET ONCE DAILY FOR CHOLESTEROL 12/20/14   Chipper Herb, MD  pravastatin (PRAVACHOL) 80 MG tablet TAKE 1 TABLET ONCE DAILY FOR CHOLESTEROL 12/20/14   Chipper Herb, MD  Vitamin D, Ergocalciferol, (DRISDOL) 50000 UNITS CAPS capsule Take 1 capsule (50,000 Units total) by mouth every 7 (seven) days. 12/22/14   Chipper Herb, MD  warfarin (COUMADIN) 2.5 MG tablet TAKE 1 TABLET ONCE DAILY OR AS DIRECTED 12/20/14   Chipper Herb, MD  There were no vitals taken for this visit. Physical Exam  Constitutional: She is oriented to person, place, and time. She appears well-developed and well-nourished. No distress.  Elderly Caucasian female, well-appearing, non-toxic in appearance  HENT:  Head: Normocephalic and atraumatic.  Ears: Normal TM Nose: Rhinorrhea Throat: Uvula is midline, her mouth is dry. Partial dentures in place.  No scalp tenderness, no midface tenderness.  Eyes: Conjunctivae are normal.  Neck: Normal range of motion. Neck supple.  Cardiovascular:  Irregularly irregular heart rhythm.  Pulmonary/Chest: She has wheezes (Expiratory wheezes without rales, rhonchi.).  Abdominal: Soft. Bowel sounds are normal. She exhibits no distension. There is no tenderness.  There is no rebound and no guarding.  No abdominal pulsatile mass.  Musculoskeletal: She exhibits no edema.  Neurological: She is alert and oriented to person, place, and time. She has normal strength. No cranial nerve deficit or sensory deficit. GCS eye subscore is 4. GCS verbal subscore is 5. GCS motor subscore is 6.  Skin: No rash noted.  Psychiatric: She has a normal mood and affect.  Nursing note and vitals reviewed.   ED Course  Procedures (including critical care time) Labs Review Labs Reviewed  CBC WITH DIFFERENTIAL/PLATELET - Abnormal; Notable for the following:    WBC 11.7 (*)    MCH 25.4 (*)    RDW 16.7 (*)    Neutro Abs 9.5 (*)    All other components within normal limits  PROTIME-INR - Abnormal; Notable for the following:    Prothrombin Time 23.0 (*)    INR 2.05 (*)    All other components within normal limits  I-STAT CHEM 8, ED - Abnormal; Notable for the following:    Potassium 3.2 (*)    Chloride 99 (*)    Glucose, Bld 156 (*)    Calcium, Ion 1.10 (*)    All other components within normal limits  CBG MONITORING, ED - Abnormal; Notable for the following:    Glucose-Capillary 146 (*)    All other components within normal limits  URINALYSIS, ROUTINE W REFLEX MICROSCOPIC (NOT AT [facility])  INFLUENZA PANEL BY PCR (TYPE A & B, H1N1)  POCT CBG (FASTING - GLUCOSE)-MANUAL ENTRY  I-STAT TROPOININ, ED    Imaging Review Dg Chest 2 View  09/04/2015  CLINICAL DATA:  Syncope. EXAM: CHEST  2 VIEW COMPARISON:  05/03/2015 FINDINGS: Heart size and pulmonary vascularity are normal. No acute infiltrates or effusions. Minimal scarring at the left lung base laterally. Calcification in the thoracic aorta. No acute osseous abnormality. IMPRESSION: No acute disease.  Aortic atherosclerosis. Electronically Signed   By: Lorriane Shire M.D.   On: 09/04/2015 16:59   Ct Head Wo Contrast  09/04/2015  CLINICAL DATA:  82 year old hypertensive female passed out while sitting in chair. Atrial  fibrillation on blood thinner. Initial encounter. EXAM: CT HEAD WITHOUT CONTRAST CT CERVICAL SPINE WITHOUT CONTRAST TECHNIQUE: Multidetector CT imaging of the head and cervical spine was performed following the standard protocol without intravenous contrast. Multiplanar CT image reconstructions of the cervical spine were also generated. COMPARISON:  None. FINDINGS: CT HEAD FINDINGS No skull fracture or intracranial hemorrhage. Prominent small vessel disease type changes. Small infarct left lenticular nucleus of indeterminate age. No CT evidence of large acute infarct. Vascular calcifications. Mild global atrophy without hydrocephalus. No intracranial mass lesion noted on this unenhanced exam. Partial opacification ethmoid sinus air cells with polypoid mucosal thickening maxillary sinuses. Post right lens replacement otherwise orbital structures unremarkable. CT CERVICAL SPINE FINDINGS No cervical spine fracture,  malalignment or abnormal prevertebral soft tissue swelling. Cervical spondylotic changes most prominent C5-6. Carotid bifurcation calcifications. No worrisome neck mass or lung apical lesion identified. IMPRESSION: CT HEAD No skull fracture or intracranial hemorrhage. Prominent small vessel disease type changes. Small infarct left lenticular nucleus of indeterminate age. No CT evidence of large acute infarct. Mild global atrophy without hydrocephalus. Partial opacification ethmoid sinus air cells with polypoid mucosal thickening maxillary sinuses. CT CERVICAL SPINE No cervical spine fracture, malalignment or abnormal prevertebral soft tissue swelling. Cervical spondylotic changes most prominent C5-6. Electronically Signed   By: Genia Del M.D.   On: 09/04/2015 17:14   Ct Cervical Spine Wo Contrast  09/04/2015  CLINICAL DATA:  82 year old hypertensive female passed out while sitting in chair. Atrial fibrillation on blood thinner. Initial encounter. EXAM: CT HEAD WITHOUT CONTRAST CT CERVICAL SPINE WITHOUT  CONTRAST TECHNIQUE: Multidetector CT imaging of the head and cervical spine was performed following the standard protocol without intravenous contrast. Multiplanar CT image reconstructions of the cervical spine were also generated. COMPARISON:  None. FINDINGS: CT HEAD FINDINGS No skull fracture or intracranial hemorrhage. Prominent small vessel disease type changes. Small infarct left lenticular nucleus of indeterminate age. No CT evidence of large acute infarct. Vascular calcifications. Mild global atrophy without hydrocephalus. No intracranial mass lesion noted on this unenhanced exam. Partial opacification ethmoid sinus air cells with polypoid mucosal thickening maxillary sinuses. Post right lens replacement otherwise orbital structures unremarkable. CT CERVICAL SPINE FINDINGS No cervical spine fracture, malalignment or abnormal prevertebral soft tissue swelling. Cervical spondylotic changes most prominent C5-6. Carotid bifurcation calcifications. No worrisome neck mass or lung apical lesion identified. IMPRESSION: CT HEAD No skull fracture or intracranial hemorrhage. Prominent small vessel disease type changes. Small infarct left lenticular nucleus of indeterminate age. No CT evidence of large acute infarct. Mild global atrophy without hydrocephalus. Partial opacification ethmoid sinus air cells with polypoid mucosal thickening maxillary sinuses. CT CERVICAL SPINE No cervical spine fracture, malalignment or abnormal prevertebral soft tissue swelling. Cervical spondylotic changes most prominent C5-6. Electronically Signed   By: Genia Del M.D.   On: 09/04/2015 17:14   I have personally reviewed and evaluated these images and lab results as part of my medical decision-making.   EKG Interpretation   Date/Time:  Sunday September 04 2015 15:52:44 EST Ventricular Rate:  88 PR Interval:    QRS Duration: 74 QT Interval:  363 QTC Calculation: 439 R Axis:   87 Text Interpretation:  Atrial fibrillation  Borderline right axis deviation  Anteroseptal infarct, old Nonspecific T abnormalities, lateral leads since  last tracing no significant change Confirmed by MILLER  MD, BRIAN ([id number])  on 09/04/2015 3:55:38 PM      MDM   Final diagnoses:  Orthostatic hypotension  Syncope and collapse  Flu-like symptoms    BP 132/75 mmHg  Pulse 118  Temp(Src) 98.3 F (36.8 C) (Oral)  Resp 22  SpO2 95%   4:21 PM Patient with cold symptoms who had a syncopal episode today. She is hypotensive upon arrival but mentating appropriately. She has history of atrial fibrillation. She is actively wheezing but maintain adequate oxygenation. Workup initiated. Patient is in no acute pain at this time. A bedside abdominal ultrasound performed by me to evaluate her aorta was performed. It appears normal.  Care discussed with Dr. Dayna Barker.    EMERGENCY DEPARTMENT [place] ABD/AORTA EXAM Study: Limited Ultrasound of the Abdominal Aorta.  INDICATIONS:Hypotension Indication: Multiple views of the abdominal aorta are obtained from the diaphragmatic hiatus to the aortic bifurcation  in transverse and sagittal planes with a multi- Frequency probe.  PERFORMED BY: Myself  IMAGES ARCHIVED?: Yes  FINDINGS: Free fluid absent  LIMITATIONS:  Body habitus  INTERPRETATION:  No abdominal aortic aneurysm  COMMENT:  Bedside US, no evidence of aneurysm or dissection  5:34 PM Positive orthostatic vital sign. Blood pressure improves with IV fluid. Head and neck CT scan showing no acute finding. Chest x-ray is unremarkable. Mild leukocytosis with WBC 11.7. Mild hypokalemia with a potassium of 3.2. EKG shows A. fib without other acute concerning arrhythmia. Patient is resting comfortably. She is therapeutic with an INR of 2.05. Suspect flu causing her symptoms, flu panel was sent.  Plan to admit pt for further management of her condition.    5:45 PM Appreciate consultation from [facility] Dr. [name] who agrees to admit pt to  telemetry bed under his care.  He will see pt in the ER.    Domenic Moras, PA-C 09/04/15 1745  Merrily Pew, MD 09/05/15 [phone]

## 2015-09-05 DIAGNOSIS — I951 Orthostatic hypotension: Secondary | ICD-10-CM | POA: Diagnosis not present

## 2015-09-05 DIAGNOSIS — I4891 Unspecified atrial fibrillation: Secondary | ICD-10-CM | POA: Diagnosis not present

## 2015-09-05 DIAGNOSIS — E860 Dehydration: Secondary | ICD-10-CM | POA: Diagnosis not present

## 2015-09-05 DIAGNOSIS — J069 Acute upper respiratory infection, unspecified: Secondary | ICD-10-CM | POA: Diagnosis not present

## 2015-09-05 DIAGNOSIS — I10 Essential (primary) hypertension: Secondary | ICD-10-CM | POA: Diagnosis not present

## 2015-09-05 DIAGNOSIS — E785 Hyperlipidemia, unspecified: Secondary | ICD-10-CM | POA: Diagnosis not present

## 2015-09-05 DIAGNOSIS — E559 Vitamin D deficiency, unspecified: Secondary | ICD-10-CM | POA: Diagnosis not present

## 2015-09-05 DIAGNOSIS — J209 Acute bronchitis, unspecified: Secondary | ICD-10-CM

## 2015-09-05 DIAGNOSIS — R55 Syncope and collapse: Secondary | ICD-10-CM | POA: Diagnosis not present

## 2015-09-05 LAB — GLUCOSE, CAPILLARY: Glucose-Capillary: 101 mg/dL — ABNORMAL HIGH (ref 65–99)

## 2015-09-05 LAB — BASIC METABOLIC PANEL
ANION GAP: 8 (ref 5–15)
BUN: 9 mg/dL (ref 6–20)
CHLORIDE: 109 mmol/L (ref 101–111)
CO2: 24 mmol/L (ref 22–32)
Calcium: 8.4 mg/dL — ABNORMAL LOW (ref 8.9–10.3)
Creatinine, Ser: 0.58 mg/dL (ref 0.44–1.00)
GFR calc Af Amer: >60 mL/min (ref >60)
GLUCOSE: 99 mg/dL (ref 65–99)
POTASSIUM: 3.8 mmol/L (ref 3.5–5.1)
SODIUM: 141 mmol/L (ref 135–145)

## 2015-09-05 LAB — PROTIME-INR
INR: 2.49 — ABNORMAL HIGH (ref 0.00–1.49)
PROTHROMBIN TIME: 26.6 s — AB (ref 11.6–15.2)

## 2015-09-05 LAB — INFLUENZA PANEL BY PCR (TYPE A & B)
H1N1 flu by pcr: NOT DETECTED
INFLAPCR: NEGATIVE
INFLBPCR: NEGATIVE

## 2015-09-05 MED ORDER — GUAIFENESIN ER 600 MG PO TB12: 600.0000 mg | ORAL_TABLET | Freq: Two times a day (BID) | ORAL | Status: DC

## 2015-09-05 MED ORDER — WARFARIN 1.25 MG HALF TABLET
1.2500 mg | ORAL_TABLET | Freq: Once | ORAL | Status: DC
  Filled 2015-09-05: qty 1

## 2015-09-05 MED ORDER — PREDNISONE 20 MG PO TABS: 40.0000 mg | ORAL_TABLET | Freq: Every day | ORAL | Status: DC

## 2015-09-05 MED ORDER — WARFARIN - PHARMACIST DOSING INPATIENT: Freq: Every day | Status: DC

## 2015-09-05 MED ORDER — PREDNISONE 20 MG PO TABS: 40.0000 mg | ORAL_TABLET | Freq: Every day | ORAL | Status: AC

## 2015-09-05 MED ORDER — DOXYCYCLINE HYCLATE 100 MG PO TABS: 100.0000 mg | ORAL_TABLET | Freq: Two times a day (BID) | ORAL | Status: AC

## 2015-09-05 MED ORDER — GUAIFENESIN-DM 100-10 MG/5ML PO SYRP: 5.0000 mL | ORAL_SOLUTION | ORAL | Status: DC | PRN

## 2015-09-05 NOTE — Progress Notes (Signed)
ANTICOAGULATION CONSULT NOTE - Follow Up Consult  Pharmacy Consult for warfarin Indication: hx atrial fibrillation  Allergies  Allergen Reactions  . Erythromycin Swelling and Other (See Comments)    Reaction unknown  . Penicillins Other (See Comments)    Reaction unknown  Has patient had a PCN reaction causing immediate rash, facial/tongue/throat swelling, SOB or lightheadedness with hypotension: unknown Has patient had a PCN reaction causing severe rash involving mucus membranes or skin necrosis: unknown Has patient had a PCN reaction that required hospitalization : unknown Has patient had a PCN reaction occurring within the last 10 years: no If all of the above answers are "NO", then may proceed with Cephalosporin use.     Patient Measurements: Height: 5\' 2"  (157.5 cm) Weight: 158 lb 3.2 oz (71.759 kg) IBW/kg (Calculated) : 50.1   Vital Signs: Temp: 98.4 F (36.9 C) (01/02 0441) Temp Source: Oral (01/02 0441) Pulse Rate: 113 (01/02 0033)  Labs:  Recent Labs  09/04/15 1622 09/04/15 1630 09/05/15 0504  HGB 12.3 15.0  --   HCT 40.0 44.0  --   PLT 216  --   --   LABPROT 23.0*  --  26.6*  INR 2.05*  --  2.49*  CREATININE  --  0.90 0.58    Estimated Creatinine Clearance: 52.9 mL/min (by C-G formula based on Cr of 0.58).   Medications:  Warfarin 2.5 mg daily except 1.25 mg on Monday and Friday  Assessment: Patient is a 82 y.o F on warfarin PTA for hx Afib.  She presented to the ED on 1/1 with c/o syncopal episode.  Warfarin resumed for chronic Afib.  Today, 09/05/2015: - INR is therapeutic at 2.49 today - CBC stable on 1/1 - no bleeding documented - drug-drug intxn: on abx  Goal of Therapy:  INR 2-3    Plan:  - warfarin 1.25 mg PO x1 today - daily INR - monitor for s/s bleeding  Alessandra Sawdey P 09/05/2015,9:34 AM

## 2015-09-05 NOTE — Discharge Instructions (Signed)
Near-Syncope Near-syncope (commonly known as near fainting) is sudden weakness, dizziness, or feeling like you might pass out. This can happen when getting up or while standing for a long time. It is caused by a sudden decrease in blood flow to the brain, which can occur for various reasons. Most of the reasons are not serious.  HOME CARE Watch your condition for any changes.  Have someone stay with you until you feel stable.  If you feel like you are going to pass out:  Lie down right away.  Prop your feet up if you can.  Breathe deeply and steadily.  Move only when the feeling has gone away. Most of the time, this feeling lasts only a few minutes. You may feel tired for several hours.  Drink enough fluids to keep your pee (urine) clear or pale yellow.  If you are taking blood pressure or heart medicine, stand up slowly.  Follow up with your doctor as told. GET HELP RIGHT AWAY IF:   You have a severe headache.  You have unusual pain in the chest, belly (abdomen), or back.  You have bleeding from the mouth or butt (rectum), or you have black or tarry poop (stool).  You feel your heart beat differently than normal, or you have a very fast pulse.  You pass out, or you twitch and shake when you pass out.  You pass out when sitting or lying down.  You feel confused.  You have trouble walking.  You are weak.  You have vision problems. MAKE SURE YOU:   Understand these instructions.  Will watch your condition.  Will get help right away if you are not doing well or get worse.   This information is not intended to replace advice given to you by your health care provider. Make sure you discuss any questions you have with your health care provider.   Document Released: 02/06/2008 Document Revised: 09/10/2014 Document Reviewed: 01/23/2013 [employer] Interactive Patient Education 2016 [employer]  

## 2015-09-05 NOTE — Discharge Summary (Signed)
Physician Discharge Summary  Kari [name] [id number] DOB: 07-08-1934 DOA: 09/04/2015  PCP: Redge Gainer, MD  Admit date: 09/04/2015 Discharge date: 09/05/2015  Time spent: 25 minutes  Recommendations for Outpatient Follow-up:  1. Discharge home with outpatient PCP follow-up. I have discontinued amlodipine and valsartan given her orthostatic syncope. 2. She  will complete 5 days of oral doxycycline and prednisone on 09/11/2015   Discharge Diagnoses:  Orthostatic syncope  Active Problems:   Obesity   Hypokalemia   Atrial fibrillation, unspecified   Orthostatic hypotension   Acute bronchitis   Acute upper respiratory infection   Discharge Condition: Fair  Diet recommendation: Low sodium  Filed Weights   09/04/15 2024  Weight: 71.759 kg (158 lb 3.2 oz)    History of present illness:  82 year old female with history of A. fib on Coumadin, hypertension, hyperlipidemia, prediabetes, obesity who was brought in by EMS after she had a syncopal episode at home. Patient reports having URI symptoms with chest congestion for the past 2 days with poor by mouth intake. Also reported generalized weakness. This morning she was sitting on a chair when she feels lightheaded and that she was going to pass out. She called her husband and before he reached out for help she slumped on her chair and landed on the floor. Side of her head hit the floor and patient was unconscious for 10-20 seconds. When she regained consciousness she was not confused or had blurred vision. No witnessed seizure, bowel or urinary incontinence. She denies any sick contacts, recent travel. Denies any headache, fever, chills, nausea, vomiting, chest pain, palpitation, abdominal pain, bowel or urinary symptoms. Denies any change in weight but has had poor appetite for the past 2 days. Reports having flu vaccine this year.  In the ED Patient was found to be hypotensive with blood pressure of 86/74. Orthostasis was negative but  the blood pressure remained low. She was given 2 L IV normal saline bolus and started on maintenance normal saline at 1 25 mL per hour and blood pressure improved. She was given albuterol and Atrovent nebs as she was very wheezy. EKG showed A. fib and was unremarkable. Blood work done showed WBC 11.7, normal hemoglobin and platelets. Chemistry showed potassium of 3.2 with normal renal function. INR of 2.05. Glucose of 156. Given her flulike symptoms of flu PCR was sent from the ED.  Head CT and CT of the cervical spine done was negative for acute injury. Chest x-ray was unremarkable. Hospitalist admission requested to observe on telemetry.   Hospital Course:  Principal problem Orthostatic syncope Admitted under observation on telemetry. Blood pressure improved with IV hydration with normal saline with potassium supplement. Patient reports poor by mouth intake for past 2 days and appeared to be dehydrated. All blood pressure medications held on admission.  (Patient on 4 different blood pressure medications, amlodipine- valsartan, captopril and atenolol). Repeat orthostasis negative this morning. -I will hold her amlodipine and valsartan and resume her other blood pressure medications (atenolol and captopril). She should follow-up with her PCP in 1 week.  Active Problems: Acute URI symptoms Patient has cough with some whitish phlegm, runny nose and chest congestion for the past 2 days. Chest x-ray unremarkable but patient was wheezy and has congestion on exam.  placeD her on scheduled albuterol neb, oral prednisone and empiric oral doxycycline. Flu PCR negative. Symptoms much better this morning. I will discharge her on 5 more days of oral doxycycline and prednisone.     Hypokalemia Replenished  Atrial fibrillation, unspecified Heart rate in 110s up to 120s and admission. Improved with IV fluids. Blood pressure medications held on admission. INR therapeutic on  Coumadin.   Hyperlipidemia Continue statin and Zetia.  Prediabetes      Code Status: full code Family Communication: Daughter at bedside Disposition Plan: Home with outpatient follow-up.   Discharge Exam: Filed Vitals:   09/05/15 0033 09/05/15 0441  BP:    Pulse: 113   Temp: 98.5 F (36.9 C) 98.4 F (36.9 C)  Resp: 22 22    General: Female in no acute distress HEENT: No pallor, moist mucosa Chest: few scattered rhonchi CVS: S1 and S2 irregular, no murmurs rub or gallop GI: Soft, nondistended, nontender Muscular skeletal: Warm, no edema CNS alert and oriented  Discharge Instructions    Current Discharge Medication List    START taking these medications   Details  doxycycline (VIBRA-TABS) 100 MG tablet Take 1 tablet (100 mg total) by mouth every 12 (twelve) hours. Qty: 10 tablet, Refills: 0    guaiFENesin (MUCINEX) 600 MG 12 hr tablet Take 1 tablet (600 mg total) by mouth 2 (two) times daily. Qty: 10 tablet, Refills: 0    guaiFENesin-dextromethorphan (ROBITUSSIN DM) 100-10 MG/5ML syrup Take 5 mLs by mouth every 4 (four) hours as needed for cough. Qty: 118 mL, Refills: 0    predniSONE (DELTASONE) 20 MG tablet Take 2 tablets (40 mg total) by mouth daily with breakfast. Qty: 5 tablet, Refills: 0      CONTINUE these medications which have NOT CHANGED   Details  atenolol (TENORMIN) 50 MG tablet TAKE (1) TABLET TWICE A DAY. Qty: 60 tablet, Refills: 11    captopril (CAPOTEN) 50 MG tablet TAKE (1) TABLET TWICE A DAY FOR HIGH BLOOD PRESSURE. Qty: 60 tablet, Refills: 11    ezetimibe (ZETIA) 10 MG tablet TAKE 1 TABLET ONCE DAILY FOR CHOLESTEROL Qty: 30 tablet, Refills: 11    pravastatin (PRAVACHOL) 80 MG tablet TAKE 1 TABLET ONCE DAILY FOR CHOLESTEROL Qty: 30 tablet, Refills: 11    prednisoLONE acetate (PRED FORTE) 1 % ophthalmic suspension Place 1 drop into the right eye 2 (two) times daily.     Vitamin D, Ergocalciferol, (DRISDOL) 50000 UNITS CAPS capsule  Take 1 capsule (50,000 Units total) by mouth every 7 (seven) days. Qty: 12 capsule, Refills: 0    warfarin (COUMADIN) 2.5 MG tablet TAKE 1 TABLET ONCE DAILY OR AS DIRECTED Qty: 30 tablet, Refills: 11      STOP taking these medications     amLODipine-valsartan (EXFORGE) 5-320 MG per tablet        Allergies  Allergen Reactions  . Erythromycin Swelling and Other (See Comments)    Reaction unknown  . Penicillins Other (See Comments)    Reaction unknown  Has patient had a PCN reaction causing immediate rash, facial/tongue/throat swelling, SOB or lightheadedness with hypotension: unknown Has patient had a PCN reaction causing severe rash involving mucus membranes or skin necrosis: unknown Has patient had a PCN reaction that required hospitalization : unknown Has patient had a PCN reaction occurring within the last 10 years: no If all of the above answers are "NO", then may proceed with Cephalosporin use.    Follow-up Information    Follow up with Redge Gainer, MD On 09/29/2015.   Specialty:  Family Medicine   Contact information:   [place] [address] [phone]        The results of significant diagnostics from this hospitalization (including imaging, microbiology,  ancillary and laboratory) are listed below for reference.    Significant Diagnostic Studies: Dg Chest 2 View  09/04/2015  CLINICAL DATA:  Syncope. EXAM: CHEST  2 VIEW COMPARISON:  05/03/2015 FINDINGS: Heart size and pulmonary vascularity are normal. No acute infiltrates or effusions. Minimal scarring at the left lung base laterally. Calcification in the thoracic aorta. No acute osseous abnormality. IMPRESSION: No acute disease.  Aortic atherosclerosis. Electronically Signed   By: Lorriane Shire M.D.   On: 09/04/2015 16:59   Ct Head Wo Contrast  09/04/2015  CLINICAL DATA:  82 year old hypertensive female passed out while sitting in chair. Atrial fibrillation on blood thinner. Initial encounter. EXAM:  CT HEAD WITHOUT CONTRAST CT CERVICAL SPINE WITHOUT CONTRAST TECHNIQUE: Multidetector CT imaging of the head and cervical spine was performed following the standard protocol without intravenous contrast. Multiplanar CT image reconstructions of the cervical spine were also generated. COMPARISON:  None. FINDINGS: CT HEAD FINDINGS No skull fracture or intracranial hemorrhage. Prominent small vessel disease type changes. Small infarct left lenticular nucleus of indeterminate age. No CT evidence of large acute infarct. Vascular calcifications. Mild global atrophy without hydrocephalus. No intracranial mass lesion noted on this unenhanced exam. Partial opacification ethmoid sinus air cells with polypoid mucosal thickening maxillary sinuses. Post right lens replacement otherwise orbital structures unremarkable. CT CERVICAL SPINE FINDINGS No cervical spine fracture, malalignment or abnormal prevertebral soft tissue swelling. Cervical spondylotic changes most prominent C5-6. Carotid bifurcation calcifications. No worrisome neck mass or lung apical lesion identified. IMPRESSION: CT HEAD No skull fracture or intracranial hemorrhage. Prominent small vessel disease type changes. Small infarct left lenticular nucleus of indeterminate age. No CT evidence of large acute infarct. Mild global atrophy without hydrocephalus. Partial opacification ethmoid sinus air cells with polypoid mucosal thickening maxillary sinuses. CT CERVICAL SPINE No cervical spine fracture, malalignment or abnormal prevertebral soft tissue swelling. Cervical spondylotic changes most prominent C5-6. Electronically Signed   By: Genia Del M.D.   On: 09/04/2015 17:14   Ct Cervical Spine Wo Contrast  09/04/2015  CLINICAL DATA:  82 year old hypertensive female passed out while sitting in chair. Atrial fibrillation on blood thinner. Initial encounter. EXAM: CT HEAD WITHOUT CONTRAST CT CERVICAL SPINE WITHOUT CONTRAST TECHNIQUE: Multidetector CT imaging of the  head and cervical spine was performed following the standard protocol without intravenous contrast. Multiplanar CT image reconstructions of the cervical spine were also generated. COMPARISON:  None. FINDINGS: CT HEAD FINDINGS No skull fracture or intracranial hemorrhage. Prominent small vessel disease type changes. Small infarct left lenticular nucleus of indeterminate age. No CT evidence of large acute infarct. Vascular calcifications. Mild global atrophy without hydrocephalus. No intracranial mass lesion noted on this unenhanced exam. Partial opacification ethmoid sinus air cells with polypoid mucosal thickening maxillary sinuses. Post right lens replacement otherwise orbital structures unremarkable. CT CERVICAL SPINE FINDINGS No cervical spine fracture, malalignment or abnormal prevertebral soft tissue swelling. Cervical spondylotic changes most prominent C5-6. Carotid bifurcation calcifications. No worrisome neck mass or lung apical lesion identified. IMPRESSION: CT HEAD No skull fracture or intracranial hemorrhage. Prominent small vessel disease type changes. Small infarct left lenticular nucleus of indeterminate age. No CT evidence of large acute infarct. Mild global atrophy without hydrocephalus. Partial opacification ethmoid sinus air cells with polypoid mucosal thickening maxillary sinuses. CT CERVICAL SPINE No cervical spine fracture, malalignment or abnormal prevertebral soft tissue swelling. Cervical spondylotic changes most prominent C5-6. Electronically Signed   By: Genia Del M.D.   On: 09/04/2015 17:14    Microbiology: No results found for this  or any previous visit (from the past 240 hour(s)).   Labs: Basic Metabolic Panel:  Recent Labs Lab 09/04/15 1630 09/05/15 0504  NA 139 141  K 3.2* 3.8  CL 99* 109  CO2  --  24  GLUCOSE 156* 99  BUN 14 9  CREATININE 0.90 0.58  CALCIUM  --  8.4*   Liver Function Tests: No results for input(s): AST, ALT, ALKPHOS, BILITOT, PROT, ALBUMIN  in the last 168 hours. No results for input(s): LIPASE, AMYLASE in the last 168 hours. No results for input(s): AMMONIA in the last 168 hours. CBC:  Recent Labs Lab 09/04/15 1622 09/04/15 1630  WBC 11.7*  --   NEUTROABS 9.5*  --   HGB 12.3 15.0  HCT 40.0 44.0  MCV 82.6  --   PLT 216  --    Cardiac Enzymes: No results for input(s): CKTOTAL, CKMB, CKMBINDEX, TROPONINI in the last 168 hours. BNP: BNP (last 3 results) No results for input(s): BNP in the last 8760 hours.  ProBNP (last 3 results) No results for input(s): PROBNP in the last 8760 hours.  CBG:  Recent Labs Lab 09/04/15 1601 09/04/15 2137 09/05/15 0738  GLUCAP 146* 134* 101*       Signed:  Louellen Molder MD  FACP  [facility] 09/05/2015, 8:56 AM

## 2015-09-06 ENCOUNTER — Encounter (HOSPITAL_COMMUNITY): Payer: Self-pay | Admitting: Emergency Medicine

## 2015-09-06 ENCOUNTER — Emergency Department (HOSPITAL_COMMUNITY): Payer: Medicare Other

## 2015-09-06 ENCOUNTER — Inpatient Hospital Stay (HOSPITAL_COMMUNITY)
Admission: EM | Admit: 2015-09-06 | Discharge: 2015-09-09 | DRG: 203 | Disposition: A | Discharge status: Home Health | Payer: Medicare Other | Attending: Internal Medicine | Admitting: Internal Medicine

## 2015-09-06 DIAGNOSIS — E559 Vitamin D deficiency, unspecified: Secondary | ICD-10-CM | POA: Diagnosis present

## 2015-09-06 DIAGNOSIS — I10 Essential (primary) hypertension: Secondary | ICD-10-CM | POA: Diagnosis present

## 2015-09-06 DIAGNOSIS — E669 Obesity, unspecified: Secondary | ICD-10-CM | POA: Diagnosis not present

## 2015-09-06 DIAGNOSIS — I4891 Unspecified atrial fibrillation: Secondary | ICD-10-CM | POA: Diagnosis present

## 2015-09-06 DIAGNOSIS — J209 Acute bronchitis, unspecified: Principal | ICD-10-CM | POA: Diagnosis present

## 2015-09-06 DIAGNOSIS — R062 Wheezing: Secondary | ICD-10-CM | POA: Diagnosis present

## 2015-09-06 DIAGNOSIS — Z79899 Other long term (current) drug therapy: Secondary | ICD-10-CM

## 2015-09-06 DIAGNOSIS — Z881 Allergy status to other antibiotic agents status: Secondary | ICD-10-CM

## 2015-09-06 DIAGNOSIS — E785 Hyperlipidemia, unspecified: Secondary | ICD-10-CM | POA: Diagnosis present

## 2015-09-06 DIAGNOSIS — R7303 Prediabetes: Secondary | ICD-10-CM | POA: Diagnosis not present

## 2015-09-06 DIAGNOSIS — J40 Bronchitis, not specified as acute or chronic: Secondary | ICD-10-CM | POA: Diagnosis present

## 2015-09-06 DIAGNOSIS — I119 Hypertensive heart disease without heart failure: Secondary | ICD-10-CM | POA: Diagnosis not present

## 2015-09-06 DIAGNOSIS — Z880 Allergy status to penicillin: Secondary | ICD-10-CM

## 2015-09-06 DIAGNOSIS — R0902 Hypoxemia: Secondary | ICD-10-CM | POA: Diagnosis present

## 2015-09-06 DIAGNOSIS — E876 Hypokalemia: Secondary | ICD-10-CM | POA: Diagnosis not present

## 2015-09-06 DIAGNOSIS — Z7901 Long term (current) use of anticoagulants: Secondary | ICD-10-CM

## 2015-09-06 DIAGNOSIS — I482 Chronic atrial fibrillation: Secondary | ICD-10-CM | POA: Diagnosis present

## 2015-09-06 DIAGNOSIS — Z6829 Body mass index (BMI) 29.0-29.9, adult: Secondary | ICD-10-CM | POA: Diagnosis not present

## 2015-09-06 DIAGNOSIS — M545 Low back pain, unspecified: Secondary | ICD-10-CM | POA: Diagnosis present

## 2015-09-06 DIAGNOSIS — R05 Cough: Secondary | ICD-10-CM | POA: Diagnosis not present

## 2015-09-06 LAB — URINALYSIS, ROUTINE W REFLEX MICROSCOPIC
BILIRUBIN URINE: NEGATIVE
Glucose, UA: NEGATIVE mg/dL
KETONES UR: NEGATIVE mg/dL
Leukocytes, UA: NEGATIVE
NITRITE: NEGATIVE
PROTEIN: NEGATIVE mg/dL
Specific Gravity, Urine: 1.01 (ref 1.005–1.030)
pH: 6 (ref 5.0–8.0)

## 2015-09-06 LAB — CBC
HCT: 38.2 % (ref 36.0–46.0)
Hemoglobin: 11.6 g/dL — ABNORMAL LOW (ref 12.0–15.0)
MCH: 25.3 pg — ABNORMAL LOW (ref 26.0–34.0)
MCHC: 30.4 g/dL (ref 30.0–36.0)
MCV: 83.4 fL (ref 78.0–100.0)
PLATELETS: 246 10*3/uL (ref 150–400)
RBC: 4.58 MIL/uL (ref 3.87–5.11)
RDW: 17 % — AB (ref 11.5–15.5)
WBC: 13.8 10*3/uL — AB (ref 4.0–10.5)

## 2015-09-06 LAB — BASIC METABOLIC PANEL
ANION GAP: 8 (ref 5–15)
BUN: 8 mg/dL (ref 6–20)
CALCIUM: 9.1 mg/dL (ref 8.9–10.3)
CO2: 23 mmol/L (ref 22–32)
Chloride: 107 mmol/L (ref 101–111)
Creatinine, Ser: 0.55 mg/dL (ref 0.44–1.00)
GFR calc Af Amer: >60 mL/min (ref >60)
GLUCOSE: 165 mg/dL — AB (ref 65–99)
POTASSIUM: 3.8 mmol/L (ref 3.5–5.1)
SODIUM: 138 mmol/L (ref 135–145)

## 2015-09-06 LAB — TROPONIN I: Troponin I: <0.03 ng/mL (ref <0.031)

## 2015-09-06 LAB — URINE MICROSCOPIC-ADD ON

## 2015-09-06 LAB — BRAIN NATRIURETIC PEPTIDE: B NATRIURETIC PEPTIDE 5: 426 pg/mL — AB (ref 0.0–100.0)

## 2015-09-06 MED ORDER — DILTIAZEM LOAD VIA INFUSION
15.0000 mg | Freq: Once | INTRAVENOUS | Status: DC
  Filled 2015-09-06: qty 15

## 2015-09-06 MED ORDER — DILTIAZEM HCL 100 MG IV SOLR
5.0000 mg/h | INTRAVENOUS | Status: DC
  Filled 2015-09-06: qty 100

## 2015-09-06 MED ORDER — ATENOLOL 50 MG PO TABS
50.0000 mg | ORAL_TABLET | Freq: Once | ORAL | Status: AC
  Administered 2015-09-07: 50 mg via ORAL
  Filled 2015-09-06: qty 1

## 2015-09-06 MED ORDER — IPRATROPIUM-ALBUTEROL 0.5-2.5 (3) MG/3ML IN SOLN
3.0000 mL | Freq: Four times a day (QID) | RESPIRATORY_TRACT | Status: DC
  Administered 2015-09-06 – 2015-09-09 (×10): 3 mL via RESPIRATORY_TRACT
  Filled 2015-09-06 (×11): qty 3

## 2015-09-06 NOTE — ED Notes (Signed)
Troponin was not collected in Triage. This RN will collect from patient during IV start.

## 2015-09-06 NOTE — ED Provider Notes (Signed)
CSN: [id number]     Arrival date & time 09/06/15  1730 History   First MD Initiated Contact with Patient 09/06/15 2017     Chief Complaint  Patient presents with  . Tachycardia  . Back Pain      HPI Patient discharged from the hospital 2 days ago with syncope and bronchitis.  Comes in with wheezing associated with coughing and back pain.  Denies productive cough.  Denies fever chills.  Denies chest pain.  Denies pedal edema.  Has known history of atrial fibrillation and is on Coumadin. Past Medical History  Diagnosis Date  . Obesity   . Symptomatic menopausal or female climacteric states   . Kyphosis   . Gallstones   . Adenomatous polyps   . AF (atrial fibrillation) ([place])   . Hyperlipidemia     x5 years  . Hypertension     x5 years  . Hx of adenomatous colonic polyps   . Osteoporosis   . Metabolic syndrome X   . Vitamin D deficiency    Past Surgical History  Procedure Laterality Date  . Vaginal hysterectomy      total  . Bladder suspension    . Colonoscopy  multiple   Family History  Problem Relation Age of Onset  . Heart attack Mother   . Stroke Mother   . Heart attack Father   . Alcohol abuse Father   . Heart attack Other   . Stroke Other   . Breast cancer Other   . Colon cancer Neg Hx   . Stroke Brother   . Heart attack Brother   . Depression Brother     suicide   Social History  Substance Use Topics  . Smoking status: Never Smoker   . Smokeless tobacco: Never Used  . Alcohol Use: No   OB History    No data available     Review of Systems  Constitutional: Negative for fever.  Respiratory: Positive for cough and wheezing.   All other systems reviewed and are negative.     Allergies  Erythromycin and Penicillins  Home Medications   Prior to Admission medications   Medication Sig Start Date End Date Taking? Authorizing Provider  captopril (CAPOTEN) 50 MG tablet TAKE (1) TABLET TWICE A DAY FOR HIGH BLOOD PRESSURE. 12/20/14  Yes Chipper Herb,  MD  doxycycline (VIBRA-TABS) 100 MG tablet Take 1 tablet (100 mg total) by mouth every 12 (twelve) hours. 09/05/15 09/09/15 Yes Nishant Dhungel, MD  ezetimibe (ZETIA) 10 MG tablet TAKE 1 TABLET ONCE DAILY FOR CHOLESTEROL 12/20/14  Yes Chipper Herb, MD  guaiFENesin (MUCINEX) 600 MG 12 hr tablet Take 1 tablet (600 mg total) by mouth 2 (two) times daily. 09/05/15  Yes Nishant Dhungel, MD  guaiFENesin-dextromethorphan (ROBITUSSIN DM) 100-10 MG/5ML syrup Take 5 mLs by mouth every 4 (four) hours as needed for cough. 09/05/15  Yes Nishant Dhungel, MD  pravastatin (PRAVACHOL) 80 MG tablet TAKE 1 TABLET ONCE DAILY FOR CHOLESTEROL 12/20/14  Yes Chipper Herb, MD  prednisoLONE acetate (PRED FORTE) 1 % ophthalmic suspension Place 1 drop into the right eye 2 (two) times daily.  08/22/15 09/12/15 Yes Historical Provider, MD  predniSONE (DELTASONE) 20 MG tablet Take 2 tablets (40 mg total) by mouth daily with breakfast. 09/05/15 09/09/15 Yes Nishant Dhungel, MD  Vitamin D, Ergocalciferol, (DRISDOL) 50000 UNITS CAPS capsule Take 1 capsule (50,000 Units total) by mouth every 7 (seven) days. 12/22/14  Yes Chipper Herb, MD  warfarin (COUMADIN) 2.5  MG tablet TAKE 1 TABLET ONCE DAILY OR AS DIRECTED Patient taking differently: Take 1.25-2.5 mg by mouth daily. Take 2.5mg  5 days a week except on Monday and Friday take 1.25mg . 12/20/14  Yes Chipper Herb, MD  atenolol (TENORMIN) 50 MG tablet TAKE (1) TABLET TWICE A DAY. Patient not taking: Reported on 09/06/2015 12/20/14   Chipper Herb, MD   BP 156/116 mmHg  Pulse 125  Temp(Src) 98.1 F (36.7 C) (Oral)  Resp 35  SpO2 94% Physical Exam  Constitutional: She is oriented to person, place, and time. She appears well-developed and well-nourished. No distress.  HENT:  Head: Normocephalic and atraumatic.  Eyes: Pupils are equal, round, and reactive to light.  Neck: Normal range of motion.  Cardiovascular: Intact distal pulses.  An irregularly irregular rhythm present. Tachycardia  present.   Pulmonary/Chest: No respiratory distress. She has wheezes.  Abdominal: Normal appearance. She exhibits no distension.  Musculoskeletal: Normal range of motion.  Neurological: She is alert and oriented to person, place, and time. No cranial nerve deficit.  Skin: Skin is warm and dry. No rash noted.  Psychiatric: She has a normal mood and affect. Her behavior is normal.  Nursing note and vitals reviewed.   ED Course  Procedures (including critical care time) Medications  diltiazem (CARDIZEM) 1 mg/mL load via infusion 15 mg (0 mg Intravenous Hold 09/06/15 2120)  diltiazem (CARDIZEM) 100 mg in dextrose 5 % 100 mL (1 mg/mL) infusion (0 mg/hr Intravenous Hold 09/06/15 2121)  ipratropium-albuterol (DUONEB) 0.5-2.5 (3) MG/3ML nebulizer solution 3 mL (3 mLs Nebulization Given 09/06/15 2130)     Labs Review Labs Reviewed  URINALYSIS, ROUTINE W REFLEX MICROSCOPIC (NOT AT [facility]) - Abnormal; Notable for the following:    Hgb urine dipstick SMALL (*)    All other components within normal limits  CBC - Abnormal; Notable for the following:    WBC 13.8 (*)    Hemoglobin 11.6 (*)    MCH 25.3 (*)    RDW 17.0 (*)    All other components within normal limits  BASIC METABOLIC PANEL - Abnormal; Notable for the following:    Glucose, Bld 165 (*)    All other components within normal limits  URINE MICROSCOPIC-ADD ON - Abnormal; Notable for the following:    Squamous Epithelial / LPF 0-5 (*)    Bacteria, UA RARE (*)    All other components within normal limits  BRAIN NATRIURETIC PEPTIDE - Abnormal; Notable for the following:    B Natriuretic Peptide 426.0 (*)    All other components within normal limits  TROPONIN I  TROPONIN I  TROPONIN I    Imaging Review Dg Chest Portable 1 View  09/06/2015  CLINICAL DATA:  Pt had a syncopal episode 2 days ago and was admitted to hospital, was release 1 day ago - hx Afib - pt reports back pain that began today , cough and wheezing x 4 days EXAM: PORTABLE  CHEST 1 VIEW COMPARISON:  09/04/2015 FINDINGS: Shallow lung inflation. Heart size is mildly enlarged. There is perihilar peribronchial thickening. There are no focal consolidations. No definite pleural effusions. IMPRESSION: Bronchitic thickening.  Cardiomegaly. Electronically Signed   By: Nolon Nations M.D.   On: 09/06/2015 21:09   I have personally reviewed and evaluated these images and lab results as part of my medical decision-making.   EKG Interpretation   Date/Time:  Tuesday September 06 2015 19:07:30 EST Ventricular Rate:  132 PR Interval:    QRS Duration: 69 QT Interval:  363 QTC Calculation: 538 R Axis:   88 Text Interpretation:  Atrial fibrillation Borderline right axis deviation  Low voltage, precordial leads Probable anteroseptal infarct, old  Nonspecific T abnormalities, lateral leads Prolonged QT interval Baseline  wander in lead(s) V2 V3 V6 Abnormal ekg Confirmed by Audie Pinto  MD, Melisssa Donner  ([id number]) on 09/06/2015 8:18:04 PM      MDM   Final diagnoses:  Hypoxia  Wheezing        Leonard Schwartz, MD 09/06/15 2251

## 2015-09-06 NOTE — ED Notes (Signed)
Placed on 2L with ED at bedside. Sat 90-94% RA not 99%

## 2015-09-06 NOTE — ED Notes (Signed)
Communication with LAB about BNP. Lab is currently in process and takes a while for the instrument to give data.

## 2015-09-06 NOTE — ED Notes (Signed)
MD at bedside. 

## 2015-09-06 NOTE — ED Notes (Signed)
Pt here yesterday for separate issue. Today began having pain at lower back radiating into lower abdomin. Denies fever/chills, N/V/D. Does have audible wheezing/rhonchi heard across room when patient exerts herself.

## 2015-09-07 ENCOUNTER — Encounter: Payer: Self-pay | Admitting: Pharmacist

## 2015-09-07 DIAGNOSIS — E876 Hypokalemia: Secondary | ICD-10-CM | POA: Diagnosis not present

## 2015-09-07 DIAGNOSIS — I482 Chronic atrial fibrillation: Secondary | ICD-10-CM | POA: Diagnosis not present

## 2015-09-07 DIAGNOSIS — M545 Low back pain, unspecified: Secondary | ICD-10-CM | POA: Diagnosis present

## 2015-09-07 DIAGNOSIS — I4891 Unspecified atrial fibrillation: Secondary | ICD-10-CM | POA: Diagnosis not present

## 2015-09-07 DIAGNOSIS — E785 Hyperlipidemia, unspecified: Secondary | ICD-10-CM | POA: Diagnosis not present

## 2015-09-07 DIAGNOSIS — R062 Wheezing: Secondary | ICD-10-CM | POA: Diagnosis not present

## 2015-09-07 DIAGNOSIS — J40 Bronchitis, not specified as acute or chronic: Secondary | ICD-10-CM | POA: Diagnosis not present

## 2015-09-07 DIAGNOSIS — R0902 Hypoxemia: Secondary | ICD-10-CM

## 2015-09-07 DIAGNOSIS — I119 Hypertensive heart disease without heart failure: Secondary | ICD-10-CM | POA: Diagnosis not present

## 2015-09-07 DIAGNOSIS — J209 Acute bronchitis, unspecified: Secondary | ICD-10-CM | POA: Diagnosis not present

## 2015-09-07 DIAGNOSIS — I10 Essential (primary) hypertension: Secondary | ICD-10-CM | POA: Diagnosis not present

## 2015-09-07 LAB — TROPONIN I: Troponin I: <0.03 ng/mL (ref <0.031)

## 2015-09-07 LAB — GLUCOSE, CAPILLARY
GLUCOSE-CAPILLARY: 132 mg/dL — AB (ref 65–99)
Glucose-Capillary: 157 mg/dL — ABNORMAL HIGH (ref 65–99)
Glucose-Capillary: 183 mg/dL — ABNORMAL HIGH (ref 65–99)
Glucose-Capillary: 115 mg/dL — ABNORMAL HIGH (ref 65–99)

## 2015-09-07 LAB — CBG MONITORING, ED
GLUCOSE-CAPILLARY: 105 mg/dL — AB (ref 65–99)
Glucose-Capillary: 98 mg/dL (ref 65–99)

## 2015-09-07 LAB — PROTIME-INR
INR: 3.35 — ABNORMAL HIGH (ref 0.00–1.49)
Prothrombin Time: 33.3 seconds — ABNORMAL HIGH (ref 11.6–15.2)

## 2015-09-07 MED ORDER — SODIUM CHLORIDE 0.9 % IJ SOLN
3.0000 mL | Freq: Two times a day (BID) | INTRAMUSCULAR | Status: DC
  Administered 2015-09-07 – 2015-09-08 (×3): 3 mL via INTRAVENOUS

## 2015-09-07 MED ORDER — ONDANSETRON HCL 4 MG/2ML IJ SOLN: 4.0000 mg | Freq: Three times a day (TID) | INTRAMUSCULAR | Status: AC | PRN

## 2015-09-07 MED ORDER — GUAIFENESIN ER 600 MG PO TB12
600.0000 mg | ORAL_TABLET | Freq: Two times a day (BID) | ORAL | Status: DC
  Administered 2015-09-07 – 2015-09-09 (×6): 600 mg via ORAL
  Filled 2015-09-07 (×6): qty 1

## 2015-09-07 MED ORDER — ALBUTEROL SULFATE (2.5 MG/3ML) 0.083% IN NEBU
2.5000 mg | INHALATION_SOLUTION | RESPIRATORY_TRACT | Status: DC | PRN
  Administered 2015-09-07: 2.5 mg via RESPIRATORY_TRACT
  Filled 2015-09-07: qty 3

## 2015-09-07 MED ORDER — INSULIN ASPART 100 UNIT/ML ~~LOC~~ SOLN
0.0000 [IU] | SUBCUTANEOUS | Status: DC
  Administered 2015-09-07: 2 [IU] via SUBCUTANEOUS
  Administered 2015-09-07: 1 [IU] via SUBCUTANEOUS
  Administered 2015-09-07 – 2015-09-08 (×3): 2 [IU] via SUBCUTANEOUS
  Administered 2015-09-09: 1 [IU] via SUBCUTANEOUS

## 2015-09-07 MED ORDER — ATENOLOL 50 MG PO TABS
50.0000 mg | ORAL_TABLET | Freq: Two times a day (BID) | ORAL | Status: DC
  Administered 2015-09-07 – 2015-09-08 (×3): 50 mg via ORAL
  Filled 2015-09-07 (×4): qty 1

## 2015-09-07 MED ORDER — PRAVASTATIN SODIUM 40 MG PO TABS
80.0000 mg | ORAL_TABLET | Freq: Every day | ORAL | Status: DC
  Administered 2015-09-07 – 2015-09-08 (×2): 80 mg via ORAL
  Filled 2015-09-07: qty 1
  Filled 2015-09-07 (×3): qty 2

## 2015-09-07 MED ORDER — ACETAMINOPHEN 325 MG PO TABS
650.0000 mg | ORAL_TABLET | Freq: Four times a day (QID) | ORAL | Status: DC | PRN
Start: 2015-09-07 — End: 2015-09-09
  Administered 2015-09-07: 650 mg via ORAL
  Filled 2015-09-07: qty 2

## 2015-09-07 MED ORDER — ENOXAPARIN SODIUM 40 MG/0.4ML ~~LOC~~ SOLN: 40.0000 mg | SUBCUTANEOUS | Status: DC

## 2015-09-07 MED ORDER — WARFARIN 1.25 MG HALF TABLET: 1.2500 mg | ORAL_TABLET | Freq: Every day | ORAL | Status: DC

## 2015-09-07 MED ORDER — PREDNISONE 20 MG PO TABS
40.0000 mg | ORAL_TABLET | Freq: Every day | ORAL | Status: DC
  Administered 2015-09-07 – 2015-09-09 (×3): 40 mg via ORAL
  Filled 2015-09-07 (×3): qty 2

## 2015-09-07 MED ORDER — DOXYCYCLINE HYCLATE 100 MG PO TABS
100.0000 mg | ORAL_TABLET | Freq: Two times a day (BID) | ORAL | Status: DC
  Administered 2015-09-07 – 2015-09-09 (×5): 100 mg via ORAL
  Filled 2015-09-07 (×5): qty 1

## 2015-09-07 MED ORDER — PREDNISOLONE ACETATE 1 % OP SUSP
1.0000 [drp] | Freq: Two times a day (BID) | OPHTHALMIC | Status: DC
  Administered 2015-09-07 – 2015-09-09 (×5): 1 [drp] via OPHTHALMIC
  Filled 2015-09-07: qty 1

## 2015-09-07 MED ORDER — LIDOCAINE 5 % EX PTCH
1.0000 | MEDICATED_PATCH | CUTANEOUS | Status: DC
  Administered 2015-09-07 – 2015-09-09 (×3): 1 via TRANSDERMAL
  Filled 2015-09-07 (×4): qty 1

## 2015-09-07 MED ORDER — CAPTOPRIL 50 MG PO TABS
50.0000 mg | ORAL_TABLET | Freq: Two times a day (BID) | ORAL | Status: DC
  Administered 2015-09-07 – 2015-09-09 (×5): 50 mg via ORAL
  Filled 2015-09-07 (×6): qty 1

## 2015-09-07 MED ORDER — EZETIMIBE 10 MG PO TABS
10.0000 mg | ORAL_TABLET | Freq: Every day | ORAL | Status: DC
Start: 2015-09-07 — End: 2015-09-09
  Administered 2015-09-07 – 2015-09-09 (×3): 10 mg via ORAL
  Filled 2015-09-07 (×3): qty 1

## 2015-09-07 MED ORDER — VITAMIN D (ERGOCALCIFEROL) 1.25 MG (50000 UNIT) PO CAPS: 50000.0000 [IU] | ORAL_CAPSULE | ORAL | Status: DC

## 2015-09-07 MED ORDER — WARFARIN - PHARMACIST DOSING INPATIENT: Freq: Every day | Status: DC

## 2015-09-07 MED ORDER — TRAMADOL HCL 50 MG PO TABS
50.0000 mg | ORAL_TABLET | Freq: Four times a day (QID) | ORAL | Status: DC | PRN
  Administered 2015-09-08: 50 mg via ORAL
  Filled 2015-09-07: qty 1

## 2015-09-07 MED ORDER — IPRATROPIUM-ALBUTEROL 0.5-2.5 (3) MG/3ML IN SOLN
3.0000 mL | RESPIRATORY_TRACT | Status: DC | PRN
  Filled 2015-09-07: qty 3

## 2015-09-07 NOTE — Evaluation (Signed)
Physical Therapy Evaluation Patient Details Name: Kari [name] MRN: [id number] DOB: February 16, 1934 Today's Date: 09/07/2015   History of Present Illness  82 yo female admitted with bronchitis. hx of Afib, lower back pain, HTN, syncope, osteoporosis.   Clinical Impression  On eval, pt required Min assist for mobility-walked ~125 feet. Pt is unsteady and at risk for falls. O2 sats 94% on RA, HR 130s-140s during ambulation. Will continue to follow. Pt may need Rw if stability does not improve over next day or so.    Follow Up Recommendations Home health PT;Supervision - Intermittent    Equipment Recommendations   (may need RW? will continue to assess)    Recommendations for Other Services       Precautions / Restrictions Precautions Precautions: Fall Precaution Comments: monitor sats, O2 sats Restrictions Weight Bearing Restrictions: No      Mobility  Bed Mobility Overal bed mobility: Needs Assistance Bed Mobility: Supine to Sit;Sit to Supine     Supine to sit: Supervision     General bed mobility comments: for safety  Transfers Overall transfer level: Needs assistance   Transfers: Sit to/from Stand Sit to Stand: Min guard         General transfer comment: close guard for safety  Ambulation/Gait Ambulation/Gait assistance: Min assist Ambulation Distance (Feet): 125 Feet Assistive device: None Gait Pattern/deviations: Step-through pattern;Decreased stride length     General Gait Details: Assist to stabilize/steady pt. Unsteady gait. O2 sats 94% on RA, HR 130s-140s with ambulation.  Stairs            Wheelchair Mobility    Modified Rankin (Stroke Patients Only)       Balance                                             Pertinent Vitals/Pain Pain Assessment: No/denies pain    Home Living Family/patient expects to be discharged to:: Private residence Living Arrangements: Spouse/significant other Available Help at Discharge:  Family Type of Home: House Home Access: Stairs to enter   Technical brewer of Steps: 2 Home Layout: One level Home Equipment: None      Prior Function Level of Independence: Independent               Hand Dominance        Extremity/Trunk Assessment   Upper Extremity Assessment: Generalized weakness           Lower Extremity Assessment: Generalized weakness      Cervical / Trunk Assessment: Normal  Communication   Communication: No difficulties  Cognition Arousal/Alertness: Awake/alert Behavior During Therapy: WFL for tasks assessed/performed Overall Cognitive Status: Within Functional Limits for tasks assessed                      General Comments      Exercises        Assessment/Plan    PT Assessment Patient needs continued PT services  PT Diagnosis Difficulty walking;Generalized weakness   PT Problem List Decreased balance;Decreased activity tolerance;Decreased mobility  PT Treatment Interventions Gait training;Functional mobility training;Therapeutic activities;Patient/family education;Balance training;Therapeutic exercise   PT Goals (Current goals can be found in the Care Plan section) Acute Rehab PT Goals Patient Stated Goal: none stated PT Goal Formulation: With patient Time For Goal Achievement: 09/21/15 Potential to Achieve Goals: Good    Frequency Min 3X/week   Barriers  to discharge        Co-evaluation               End of Session Equipment Utilized During Treatment: Gait belt Activity Tolerance: Patient tolerated treatment well Patient left: in bed;with call bell/phone within reach;with bed alarm set      Functional Assessment Tool Used: clinical judgement Functional Limitation: Mobility: Walking and moving around Mobility: Walking and Moving Around Current Status [id number]): At least 20 percent but less than 40 percent impaired, limited or restricted Mobility: Walking and Moving Around Goal Status [phone]):  At least 1 percent but less than 20 percent impaired, limited or restricted    Time: [id number] PT Time Calculation (min) (ACUTE ONLY): 26 min   Charges:   PT Evaluation $PT Eval Low Complexity: 1 Procedure     PT G Codes:   PT G-Codes **NOT FOR INPATIENT CLASS** Functional Assessment Tool Used: clinical judgement Functional Limitation: Mobility: Walking and moving around Mobility: Walking and Moving Around Current Status [id number]): At least 20 percent but less than 40 percent impaired, limited or restricted Mobility: Walking and Moving Around Goal Status [phone]): At least 1 percent but less than 20 percent impaired, limited or restricted    Weston Anna, MPT Pager: [phone]

## 2015-09-07 NOTE — Progress Notes (Signed)
Patient admitted earlier today by Dr. Fuller Plan. Please see full H&P for details.  Agree with current assessment and plan.  This is a 82 year old female with a history of atrial fibrillation on anticoagulation, hypertension, presented to the emergency department with complaints of back pain. Patient was recently discharged on 09/05/2015 after being admitted for syncopal episode and possible upper respiratory infection. Patient was started on doxycycline as well as prednisone taper at that time. The emergency department, patient was noted to have a heart rate in the 110-140 range and was started on Cardizem drip however patient was given her atenolol home dose, heart rate currently stable. Patient was admitted for bronchitis, as chest x-ray showed bronchitic thickening and cardiomegaly. Continue doxycycline and prednisone along with nebulizer treatments. Patient currently on Coumadin for atrial fibrillation, heart rate is better controlled at this time.   Patient seen and examined and currently stable. Continue current treatment. Expect discharge within 24-48hours.  Time spent: 15 minutes  Sonu Kruckenberg D.O. [facility] Pager [phone]  If 7PM-7AM, please contact night-coverage [email or web address] Password [email or web address] 09/07/2015, 2:40 PM

## 2015-09-07 NOTE — ED Notes (Signed)
Pharmacy called in regards to not receiving patient's scheduled 1000 medications after electronic notification. Patient will received medications as soon as they are available.

## 2015-09-07 NOTE — Progress Notes (Signed)
ANTICOAGULATION CONSULT NOTE - Initial Consult  Pharmacy Consult for warfarin Indication: atrial fibrillation  Allergies  Allergen Reactions  . Erythromycin Swelling and Other (See Comments)    Reaction unknown  . Penicillins Other (See Comments)    Reaction unknown  Has patient had a PCN reaction causing immediate rash, facial/tongue/throat swelling, SOB or lightheadedness with hypotension: unknown Has patient had a PCN reaction causing severe rash involving mucus membranes or skin necrosis: unknown Has patient had a PCN reaction that required hospitalization : unknown Has patient had a PCN reaction occurring within the last 10 years: no If all of the above answers are "NO", then may proceed with Cephalosporin use.     Patient Measurements:    Vital Signs: Temp: 98.7 F (37.1 C) (01/04 0804) Temp Source: Oral (01/04 0804) BP: 152/89 mmHg (01/04 0804) Pulse Rate: 90 (01/04 0804)  Labs:  Recent Labs  09/04/15 1622 09/04/15 1630 09/05/15 0504 09/06/15 1755 09/06/15 2035 09/07/15 0152 09/07/15 0759  HGB 12.3 15.0  --  11.6*  --   --   --   HCT 40.0 44.0  --  38.2  --   --   --   PLT 216  --   --  246  --   --   --   LABPROT 23.0*  --  26.6*  --   --   --  33.3*  INR 2.05*  --  2.49*  --   --   --  3.35*  CREATININE  --  0.90 0.58 0.55  --   --   --   TROPONINI  --   --   --   --  <0.03 <0.03 <0.03    Estimated Creatinine Clearance: 52.9 mL/min (by C-G formula based on Cr of 0.55).   Medical History: Past Medical History  Diagnosis Date  . Obesity   . Symptomatic menopausal or female climacteric states   . Kyphosis   . Gallstones   . Adenomatous polyps   . AF (atrial fibrillation) ([place])   . Hyperlipidemia     x5 years  . Hypertension     x5 years  . Hx of adenomatous colonic polyps   . Osteoporosis   . Metabolic syndrome X   . Vitamin D deficiency     Medications:  Scheduled:  . atenolol  50 mg Oral BID  . captopril  50 mg Oral BID  . doxycycline   100 mg Oral Q12H  . ezetimibe  10 mg Oral Daily  . guaiFENesin  600 mg Oral BID  . insulin aspart  0-9 Units Subcutaneous 6 times per day  . ipratropium-albuterol  3 mL Nebulization Q6H  . lidocaine  1 patch Transdermal Q24H  . pravastatin  80 mg Oral q1800  . prednisoLONE acetate  1 drop Right Eye BID  . predniSONE  40 mg Oral Q breakfast  . [START ON 09/12/2015] Vitamin D (Ergocalciferol)  50,000 Units Oral Q7 days   Infusions:    Assessment: 83 yoF c/o tachycardia and back pain s/p recent hospitalization with syncope and bronchitis on chronic warfarin for A-fib.  HD= 2.5 mg daily except 1.25 on Mon/Fri.  LD 1/3 0830. INR at baseline this AM is supratherapeutic  Goal of Therapy:  INR 2-3   Plan:  1) No warfarin today due to INR > 3 2) Daily INR   Adrian Saran, PharmD, BCPS Pager [phone] 09/07/2015 9:09 AM

## 2015-09-07 NOTE — ED Notes (Signed)
12:35 

## 2015-09-07 NOTE — H&P (Addendum)
[facility] History and Physical  Kari [name] [id number] DOB: [birth year] DOA: 09/06/2015  Referring physician: ED PCP: Redge Gainer, MD   Chief Complaint: Hurting  in my back  HPI:  [name] is a 82 year old female with a past medical history significant for atrial fibrillation on chronic anticoagulation therapy, hypertension, and hyperlipidemia; who presents with complaints of hurting in her back. She was just discharged from the hospital on on 09/05/2015 after being admitted for syncopal episode thought to have had a possible upper URI started on doxycycline and prednisone. However when the patient got home she noted to have pains in her back starting after lunch on(1/3). Pain radiated into her abdomen. She notes that she wasn't sent home with any kind of breathing treatments. Family noted that she still is having significant shortness of breath with wheezing, and whenever ambulating was easily fatigued. They prompted her to come back to the hospital for further evaluation with these new symptoms.  Upon arrival to the emergency department the patient was evaluated with a chest x-ray which shows bronchic thickening and cardiomegaly. Initial lab work showed WBC of 13.8 , and a BNP of 426. She reports a chronic history of atrial fibrillation and was not able to take her nighttime atenolol. Heart rates ranged from 110-140's. Initially orders were written by the ED physician for the patient be started on a Cardizem drip in which patient would have been placed in stepdown. However, the patient was given atenolol per home dose and change to a telemetry bed.    Review of Systems  Constitutional: Positive for malaise/fatigue. Negative for fever, chills and diaphoresis.  HENT: Negative for hearing loss and tinnitus.   Eyes: Negative for pain and discharge.  Respiratory: Positive for cough, sputum production, shortness of breath and wheezing.   Cardiovascular: Negative for orthopnea and  claudication.  Gastrointestinal: Negative for vomiting, abdominal pain and diarrhea.  Genitourinary: Negative for urgency and frequency.  Musculoskeletal: Positive for myalgias, back pain and joint pain.  Skin: Negative for itching and rash.  Neurological: Negative for sensory change and speech change.  Endo/Heme/Allergies: Negative for environmental allergies. Bruises/bleeds easily.  Psychiatric/Behavioral: Negative for suicidal ideas and substance abuse.    Past Medical History  Diagnosis Date  . Obesity   . Symptomatic menopausal or female climacteric states   . Kyphosis   . Gallstones   . Adenomatous polyps   . AF (atrial fibrillation) ([place])   . Hyperlipidemia     x5 years  . Hypertension     x5 years  . Hx of adenomatous colonic polyps   . Osteoporosis   . Metabolic syndrome X   . Vitamin D deficiency      Past Surgical History  Procedure Laterality Date  . Vaginal hysterectomy      total  . Bladder suspension    . Colonoscopy  multiple      Social History:  reports that she has never smoked. She has never used smokeless tobacco. She reports that she does not drink alcohol or use illicit drugs.   Allergies  Allergen Reactions  . Erythromycin Swelling and Other (See Comments)    Reaction unknown  . Penicillins Other (See Comments)    Reaction unknown  Has patient had a PCN reaction causing immediate rash, facial/tongue/throat swelling, SOB or lightheadedness with hypotension: unknown Has patient had a PCN reaction causing severe rash involving mucus membranes or skin necrosis: unknown Has patient had a PCN reaction that required hospitalization : unknown Has patient  had a PCN reaction occurring within the last 10 years: no If all of the above answers are "NO", then may proceed with Cephalosporin use.     Family History  Problem Relation Age of Onset  . Heart attack Mother   . Stroke Mother   . Heart attack Father   . Alcohol abuse Father   . Heart  attack Other   . Stroke Other   . Breast cancer Other   . Colon cancer Neg Hx   . Stroke Brother   . Heart attack Brother   . Depression Brother     suicide        Prior to Admission medications   Medication Sig Start Date End Date Taking? Authorizing Provider  captopril (CAPOTEN) 50 MG tablet TAKE (1) TABLET TWICE A DAY FOR HIGH BLOOD PRESSURE. 12/20/14  Yes Chipper Herb, MD  doxycycline (VIBRA-TABS) 100 MG tablet Take 1 tablet (100 mg total) by mouth every 12 (twelve) hours. 09/05/15 09/09/15 Yes Nishant Dhungel, MD  ezetimibe (ZETIA) 10 MG tablet TAKE 1 TABLET ONCE DAILY FOR CHOLESTEROL 12/20/14  Yes Chipper Herb, MD  guaiFENesin (MUCINEX) 600 MG 12 hr tablet Take 1 tablet (600 mg total) by mouth 2 (two) times daily. 09/05/15  Yes Nishant Dhungel, MD  guaiFENesin-dextromethorphan (ROBITUSSIN DM) 100-10 MG/5ML syrup Take 5 mLs by mouth every 4 (four) hours as needed for cough. 09/05/15  Yes Nishant Dhungel, MD  pravastatin (PRAVACHOL) 80 MG tablet TAKE 1 TABLET ONCE DAILY FOR CHOLESTEROL 12/20/14  Yes Chipper Herb, MD  prednisoLONE acetate (PRED FORTE) 1 % ophthalmic suspension Place 1 drop into the right eye 2 (two) times daily.  08/22/15 09/12/15 Yes Historical Provider, MD  predniSONE (DELTASONE) 20 MG tablet Take 2 tablets (40 mg total) by mouth daily with breakfast. 09/05/15 09/09/15 Yes Nishant Dhungel, MD  Vitamin D, Ergocalciferol, (DRISDOL) 50000 UNITS CAPS capsule Take 1 capsule (50,000 Units total) by mouth every 7 (seven) days. 12/22/14  Yes Chipper Herb, MD  warfarin (COUMADIN) 2.5 MG tablet TAKE 1 TABLET ONCE DAILY OR AS DIRECTED Patient taking differently: Take 1.25-2.5 mg by mouth daily. Take 2.5mg  5 days a week except on Monday and Friday take 1.25mg . 12/20/14  Yes Chipper Herb, MD  atenolol (TENORMIN) 50 MG tablet TAKE (1) TABLET TWICE A DAY. Patient not taking: Reported on 09/06/2015 12/20/14   Chipper Herb, MD     Physical Exam: Filed Vitals:   09/07/15 0100 09/07/15 0130  09/07/15 0200 09/07/15 0222  BP: 123/81 156/99 140/74 140/74  Pulse:    116  Temp:      TempSrc:      Resp: 19 17 18 18   SpO2: 96% 96% 96% 97%     Constitutional: Vital signs reviewed. Patient isa elderly obese female who appears to be in no acute distress. Alert and oriented x3.  Head: Normocephalic and atraumatic  Ear: TM normal bilaterally  Mouth: no erythema or exudates, MMM  Eyes: PERRL, EOMI, conjunctivae normal, No scleral icterus.  Neck: Supple, Trachea midline normal ROM, No JVD, mass, thyromegaly, or carotid bruit present.  Cardiovascular: irregular irregular rhythm Chest:  decreased aeration with scattered rhonchi and wheezes still present  Abdominal: Soft. Non-tender, non-distended, bowel sounds are normal, no masses, organomegaly, or guarding present.  GU: no CVA tenderness Musculoskeletal: No joint deformities, erythema, or stiffness, ROM full. Tenderness to palpation of the lower paraspinal muscles of the back  Ext: no edema and no cyanosis, pulses palpable bilaterally (DP  and PT)  Hematology: no cervical, inginal, or axillary adenopathy.  Neurological: A&O x3, Strenght is normal and symmetric bilaterally, cranial nerve II-XII are grossly intact, no focal motor deficit, sensory intact to light touch bilaterally.  Skin: Warm, dry and intact. No rash, cyanosis, or clubbing.  Psychiatric: Normal mood and affect. speech and behavior is normal. Judgment and thought content normal. Cognition and memory are normal.      Data Review   Micro Results No results found for this or any previous visit (from the past 240 hour(s)).  Radiology Reports Dg Chest 2 View  09/04/2015  CLINICAL DATA:  Syncope. EXAM: CHEST  2 VIEW COMPARISON:  05/03/2015 FINDINGS: Heart size and pulmonary vascularity are normal. No acute infiltrates or effusions. Minimal scarring at the left lung base laterally. Calcification in the thoracic aorta. No acute osseous abnormality. IMPRESSION: No acute disease.   Aortic atherosclerosis. Electronically Signed   By: Lorriane Shire M.D.   On: 09/04/2015 16:59   Ct Head Wo Contrast  09/04/2015  CLINICAL DATA:  82 year old hypertensive female passed out while sitting in chair. Atrial fibrillation on blood thinner. Initial encounter. EXAM: CT HEAD WITHOUT CONTRAST CT CERVICAL SPINE WITHOUT CONTRAST TECHNIQUE: Multidetector CT imaging of the head and cervical spine was performed following the standard protocol without intravenous contrast. Multiplanar CT image reconstructions of the cervical spine were also generated. COMPARISON:  None. FINDINGS: CT HEAD FINDINGS No skull fracture or intracranial hemorrhage. Prominent small vessel disease type changes. Small infarct left lenticular nucleus of indeterminate age. No CT evidence of large acute infarct. Vascular calcifications. Mild global atrophy without hydrocephalus. No intracranial mass lesion noted on this unenhanced exam. Partial opacification ethmoid sinus air cells with polypoid mucosal thickening maxillary sinuses. Post right lens replacement otherwise orbital structures unremarkable. CT CERVICAL SPINE FINDINGS No cervical spine fracture, malalignment or abnormal prevertebral soft tissue swelling. Cervical spondylotic changes most prominent C5-6. Carotid bifurcation calcifications. No worrisome neck mass or lung apical lesion identified. IMPRESSION: CT HEAD No skull fracture or intracranial hemorrhage. Prominent small vessel disease type changes. Small infarct left lenticular nucleus of indeterminate age. No CT evidence of large acute infarct. Mild global atrophy without hydrocephalus. Partial opacification ethmoid sinus air cells with polypoid mucosal thickening maxillary sinuses. CT CERVICAL SPINE No cervical spine fracture, malalignment or abnormal prevertebral soft tissue swelling. Cervical spondylotic changes most prominent C5-6. Electronically Signed   By: Genia Del M.D.   On: 09/04/2015 17:14   Ct Cervical  Spine Wo Contrast  09/04/2015  CLINICAL DATA:  82 year old hypertensive female passed out while sitting in chair. Atrial fibrillation on blood thinner. Initial encounter. EXAM: CT HEAD WITHOUT CONTRAST CT CERVICAL SPINE WITHOUT CONTRAST TECHNIQUE: Multidetector CT imaging of the head and cervical spine was performed following the standard protocol without intravenous contrast. Multiplanar CT image reconstructions of the cervical spine were also generated. COMPARISON:  None. FINDINGS: CT HEAD FINDINGS No skull fracture or intracranial hemorrhage. Prominent small vessel disease type changes. Small infarct left lenticular nucleus of indeterminate age. No CT evidence of large acute infarct. Vascular calcifications. Mild global atrophy without hydrocephalus. No intracranial mass lesion noted on this unenhanced exam. Partial opacification ethmoid sinus air cells with polypoid mucosal thickening maxillary sinuses. Post right lens replacement otherwise orbital structures unremarkable. CT CERVICAL SPINE FINDINGS No cervical spine fracture, malalignment or abnormal prevertebral soft tissue swelling. Cervical spondylotic changes most prominent C5-6. Carotid bifurcation calcifications. No worrisome neck mass or lung apical lesion identified. IMPRESSION: CT HEAD No skull fracture or intracranial  hemorrhage. Prominent small vessel disease type changes. Small infarct left lenticular nucleus of indeterminate age. No CT evidence of large acute infarct. Mild global atrophy without hydrocephalus. Partial opacification ethmoid sinus air cells with polypoid mucosal thickening maxillary sinuses. CT CERVICAL SPINE No cervical spine fracture, malalignment or abnormal prevertebral soft tissue swelling. Cervical spondylotic changes most prominent C5-6. Electronically Signed   By: Genia Del M.D.   On: 09/04/2015 17:14   Dg Chest Portable 1 View  09/06/2015  CLINICAL DATA:  Pt had a syncopal episode 2 days ago and was admitted to  hospital, was release 1 day ago - hx Afib - pt reports back pain that began today , cough and wheezing x 4 days EXAM: PORTABLE CHEST 1 VIEW COMPARISON:  09/04/2015 FINDINGS: Shallow lung inflation. Heart size is mildly enlarged. There is perihilar peribronchial thickening. There are no focal consolidations. No definite pleural effusions. IMPRESSION: Bronchitic thickening.  Cardiomegaly. Electronically Signed   By: Nolon Nations M.D.   On: 09/06/2015 21:09     CBC  Recent Labs Lab 09/04/15 1622 09/04/15 1630 09/06/15 1755  WBC 11.7*  --  13.8*  HGB 12.3 15.0 11.6*  HCT 40.0 44.0 38.2  PLT 216  --  246  MCV 82.6  --  83.4  MCH 25.4*  --  25.3*  MCHC 30.8  --  30.4  RDW 16.7*  --  17.0*  LYMPHSABS 1.2  --   --   MONOABS 0.9  --   --   EOSABS 0.1  --   --   BASOSABS 0.0  --   --     Chemistries   Recent Labs Lab 09/04/15 1630 09/05/15 0504 09/06/15 1755  NA 139 141 138  K 3.2* 3.8 3.8  CL 99* 109 107  CO2  --  24 23  GLUCOSE 156* 99 165*  BUN 14 9 8   CREATININE 0.90 0.58 0.55  CALCIUM  --  8.4* 9.1   ------------------------------------------------------------------------------------------------------------------ estimated creatinine clearance is 52.9 mL/min (by C-G formula based on Cr of 0.55). ------------------------------------------------------------------------------------------------------------------ No results for input(s): HGBA1C in the last 72 hours. ------------------------------------------------------------------------------------------------------------------ No results for input(s): CHOL, HDL, LDLCALC, TRIG, CHOLHDL, LDLDIRECT in the last 72 hours. ------------------------------------------------------------------------------------------------------------------ No results for input(s): TSH, T4TOTAL, T3FREE, THYROIDAB in the last 72 hours.  Invalid input(s):  FREET3 ------------------------------------------------------------------------------------------------------------------ No results for input(s): VITAMINB12, FOLATE, FERRITIN, TIBC, IRON, RETICCTPCT in the last 72 hours.  Coagulation profile  Recent Labs Lab 09/04/15 1622 09/05/15 0504  INR 2.05* 2.49*    No results for input(s): DDIMER in the last 72 hours.  Cardiac Enzymes  Recent Labs Lab 09/06/15 2035 09/07/15 0152  TROPONINI <0.03 <0.03   ------------------------------------------------------------------------------------------------------------------ Invalid input(s): POCBNP   CBG:  Recent Labs Lab 09/04/15 1601 09/04/15 2137 09/05/15 0738  GLUCAP 146* 134* 101*       EKG: Independently reviewed. Atrial fibrillation Assessment/Plan Active Problems: Bronchitis: Acute patient just recently discharged from hospital prescribed prednisone with doxycycline. Would recommend setting up patient to get breathing treatments if needed at discharge - Admit to telemetry bed - Continue doxycycline and prednisone - DuoNeb breathing treatments scheduled every 6 hours - Mucinex   Suspected Hypoxia: Patient with O2 sats 90-94% on room air - Continuous pulse oximetry with nasal cannula oxygen to keep O2 sats greater than 92%  - Recommend ambulatory 6 minute walk prior to discharging   Atrial fibrillation,Chronic: Heart rate in 110-140s. INR therapeutic at 2.49. Chadvasc score of 4 - Continue atenolol per home dose with  the first dose to be given now  - Continue Coumadin. Dosing per pharmacy - Discontinued diltiazem drip  Low back pain: suspect musculoskeletal in nature as pain reproducible with palpation.  - Lidoderm patch  - Physical therapy to eval and treat   Hypertension: Stable at this time patient was discontinued on valsartan-hydrochlorothiazide after suffering syncopal event for which she was just recently discharged on 09/05/2015. - Continue captopril    Hyperlipidemia - Continue statin and Zetia.  Prediabetes - CBG checks every 4 hours with sensitive a sliding scale  Code Status:   full Family Communication: bedside Disposition Plan: admit   Total time spent 55 minutes.Greater than 50% of this time was spent in counseling, explanation of diagnosis, planning of further management, and coordination of care  [facility] Pager [phone]  If 7PM-7AM, please contact night-coverage [email or web address] Password [email or web address] 09/07/2015, 3:54 AM

## 2015-09-07 NOTE — Progress Notes (Signed)
Kari Fantasia, NP on call, notified regarding pt's need for something for lower back pain. Kari [name] I

## 2015-09-08 DIAGNOSIS — I119 Hypertensive heart disease without heart failure: Secondary | ICD-10-CM | POA: Diagnosis not present

## 2015-09-08 DIAGNOSIS — M545 Low back pain: Secondary | ICD-10-CM

## 2015-09-08 DIAGNOSIS — R062 Wheezing: Secondary | ICD-10-CM

## 2015-09-08 DIAGNOSIS — R0902 Hypoxemia: Secondary | ICD-10-CM | POA: Diagnosis not present

## 2015-09-08 DIAGNOSIS — J209 Acute bronchitis, unspecified: Secondary | ICD-10-CM | POA: Diagnosis not present

## 2015-09-08 DIAGNOSIS — E785 Hyperlipidemia, unspecified: Secondary | ICD-10-CM | POA: Diagnosis not present

## 2015-09-08 DIAGNOSIS — J40 Bronchitis, not specified as acute or chronic: Secondary | ICD-10-CM

## 2015-09-08 DIAGNOSIS — E876 Hypokalemia: Secondary | ICD-10-CM | POA: Diagnosis not present

## 2015-09-08 DIAGNOSIS — I482 Chronic atrial fibrillation: Secondary | ICD-10-CM | POA: Diagnosis not present

## 2015-09-08 DIAGNOSIS — I4891 Unspecified atrial fibrillation: Secondary | ICD-10-CM

## 2015-09-08 DIAGNOSIS — I10 Essential (primary) hypertension: Secondary | ICD-10-CM

## 2015-09-08 LAB — BASIC METABOLIC PANEL
ANION GAP: 10 (ref 5–15)
BUN: 8 mg/dL (ref 6–20)
CALCIUM: 9.3 mg/dL (ref 8.9–10.3)
CO2: 26 mmol/L (ref 22–32)
Chloride: 105 mmol/L (ref 101–111)
Creatinine, Ser: 0.66 mg/dL (ref 0.44–1.00)
GLUCOSE: 97 mg/dL (ref 65–99)
POTASSIUM: 3.2 mmol/L — AB (ref 3.5–5.1)
SODIUM: 141 mmol/L (ref 135–145)

## 2015-09-08 LAB — CBC
HCT: 37.2 % (ref 36.0–46.0)
Hemoglobin: 11.4 g/dL — ABNORMAL LOW (ref 12.0–15.0)
MCH: 25.2 pg — ABNORMAL LOW (ref 26.0–34.0)
MCHC: 30.6 g/dL (ref 30.0–36.0)
MCV: 82.1 fL (ref 78.0–100.0)
PLATELETS: 260 10*3/uL (ref 150–400)
RBC: 4.53 MIL/uL (ref 3.87–5.11)
RDW: 16.7 % — AB (ref 11.5–15.5)
WBC: 10.4 10*3/uL (ref 4.0–10.5)

## 2015-09-08 LAB — PROTIME-INR
INR: 3.13 — AB (ref 0.00–1.49)
PROTHROMBIN TIME: 31.6 s — AB (ref 11.6–15.2)

## 2015-09-08 LAB — GLUCOSE, CAPILLARY
GLUCOSE-CAPILLARY: 159 mg/dL — AB (ref 65–99)
GLUCOSE-CAPILLARY: 188 mg/dL — AB (ref 65–99)
GLUCOSE-CAPILLARY: 117 mg/dL — AB (ref 65–99)
Glucose-Capillary: 102 mg/dL — ABNORMAL HIGH (ref 65–99)
Glucose-Capillary: 88 mg/dL (ref 65–99)

## 2015-09-08 LAB — MAGNESIUM: MAGNESIUM: 1.8 mg/dL (ref 1.7–2.4)

## 2015-09-08 MED ORDER — POTASSIUM CHLORIDE CRYS ER 20 MEQ PO TBCR
40.0000 meq | EXTENDED_RELEASE_TABLET | Freq: Once | ORAL | Status: AC
  Administered 2015-09-08: 40 meq via ORAL
  Filled 2015-09-08: qty 2

## 2015-09-08 MED ORDER — TRAMADOL HCL 50 MG PO TABS: 50.0000 mg | ORAL_TABLET | Freq: Four times a day (QID) | ORAL | Status: DC | PRN

## 2015-09-08 MED ORDER — ALBUTEROL SULFATE HFA 108 (90 BASE) MCG/ACT IN AERS: 2.0000 | INHALATION_SPRAY | Freq: Four times a day (QID) | RESPIRATORY_TRACT | Status: DC | PRN

## 2015-09-08 MED ORDER — LIDOCAINE 5 % EX PTCH: 1.0000 | MEDICATED_PATCH | CUTANEOUS | Status: DC

## 2015-09-08 MED ORDER — METOPROLOL TARTRATE 50 MG PO TABS
75.0000 mg | ORAL_TABLET | Freq: Two times a day (BID) | ORAL | Status: DC
  Administered 2015-09-08 – 2015-09-09 (×2): 75 mg via ORAL
  Filled 2015-09-08 (×2): qty 1

## 2015-09-08 NOTE — Progress Notes (Signed)
Pt and son agreed with whatever agency would service the area. [facility] was selected. Referral given to in house rep.

## 2015-09-08 NOTE — Progress Notes (Signed)
Patient HR going up into the 130's/140's with exertion.  MD made aware.  New orders put in.  Will continue to monitor closely.

## 2015-09-08 NOTE — Progress Notes (Signed)
[facility] PROGRESS NOTE  Kari [name] [id number] DOB: Jun 08, 1934 DOA: 09/06/2015 PCP: Kari Gainer, MD  Brief narrative 82 year female with history of A. fib on warfarin, hypertension recently hospitalized under observation after presenting with orthostatic syncope and acute bronchitis and discharged home on prednisone and doxycycline (on 09/05/2015) return back to the ED with complaint of low back pain and some persistent shortness of breath. In the ED she had a heart rate in-140 and was started on Cardizem drip for a brief duration. Admitted to hospitalist service.   Assessment/Plan: Acute persistent bronchitis Continue scheduled nebs, oral prednisone and doxycycline. Continue Mucinex. Patient ambulatory in the hallway on room air but does get tachycardic with heart rate sustaining 120s. Also wheezy this afternoon so discharge was held. Due to current treatment for now and monitor another day.  Chronic A. fib Heart rate stable at rest but worsens to 120s occasionally nonsustained to 140s. Continue home dose of atenolol. If heart rate continues to be elevated I will add Cardizem. Continue Coumadin. Dosing per pharmacy.  Low back pain Appears to musculoskeletal. Much improved with Lidoderm patch. Continue when necessary Toradol. Seen by PT and recommend home health.  Hypertension Blood pressure mildly elevated. Continue atenolol and captopril. Valsartan and at cities he were recently discontinued due to orthostatic syncope.  Hypokalemia Replenished. Check magnesium level  Hyperlipidemia Continue statin and Zetia  Prediabetes Monitor on sliding scale.  Code Status: Full code Family Communication: None at bedside Disposition Plan: Home possibly tomorrow if breathing better.   Consultants:  None  Procedures:  None  Antibiotics:  Doxycycline  HPI/Subjective: Seen and examined. Reported breathing to be better as well as wheezy again this afternoon after  ambulate in the hallway.  Objective: Filed Vitals:   09/08/15 0421 09/08/15 1300  BP: 155/95 144/91  Pulse: 112 103  Temp: 97.5 F (36.4 C) 97.7 F (36.5 C)  Resp: 20 21    Intake/Output Summary (Last 24 hours) at 09/08/15 1459 Last data filed at 09/08/15 1300  Gross per 24 hour  Intake    920 ml  Output    500 ml  Net    420 ml   Filed Weights   09/07/15 1256  Weight: 70.444 kg (155 lb 4.8 oz)    Exam:   General:  Elderly female not in distress  Cardiovascular: S1 and S2 irregular, no murmur rub or gallop  Respiratory:  scattered wheezing bilaterally  Abdomen: Soft, nondistended, nontender  Musculoskeletal: Warm, no edema  Data Reviewed: Basic Metabolic Panel:  Recent Labs Lab 09/04/15 1630 09/05/15 0504 09/06/15 1755 09/08/15 0417  NA 139 141 138 141  K 3.2* 3.8 3.8 3.2*  CL 99* 109 107 105  CO2  --  24 23 26   GLUCOSE 156* 99 165* 97  BUN 14 9 8 8   CREATININE 0.90 0.58 0.55 0.66  CALCIUM  --  8.4* 9.1 9.3   Liver Function Tests: No results for input(s): AST, ALT, ALKPHOS, BILITOT, PROT, ALBUMIN in the last 168 hours. No results for input(s): LIPASE, AMYLASE in the last 168 hours. No results for input(s): AMMONIA in the last 168 hours. CBC:  Recent Labs Lab 09/04/15 1622 09/04/15 1630 09/06/15 1755 09/08/15 0417  WBC 11.7*  --  13.8* 10.4  NEUTROABS 9.5*  --   --   --   HGB 12.3 15.0 11.6* 11.4*  HCT 40.0 44.0 38.2 37.2  MCV 82.6  --  83.4 82.1  PLT 216  --  246 260  Cardiac Enzymes:  Recent Labs Lab 09/06/15 2035 09/07/15 0152 09/07/15 0759  TROPONINI <0.03 <0.03 <0.03   BNP (last 3 results)  Recent Labs  09/06/15 1751  BNP 426.0*    ProBNP (last 3 results) No results for input(s): PROBNP in the last 8760 hours.  CBG:  Recent Labs Lab 09/07/15 2006 09/07/15 2329 09/08/15 0415 09/08/15 0741 09/08/15 1143  GLUCAP 183* 115* 102* 88 159*    No results found for this or any previous visit (from the past 240  hour(s)).   Studies: Dg Chest Portable 1 View  09/06/2015  CLINICAL DATA:  Pt had a syncopal episode 2 days ago and was admitted to hospital, was release 1 day ago - hx Afib - pt reports back pain that began today , cough and wheezing x 4 days EXAM: PORTABLE CHEST 1 VIEW COMPARISON:  09/04/2015 FINDINGS: Shallow lung inflation. Heart size is mildly enlarged. There is perihilar peribronchial thickening. There are no focal consolidations. No definite pleural effusions. IMPRESSION: Bronchitic thickening.  Cardiomegaly. Electronically Signed   By: Nolon Nations M.D.   On: 09/06/2015 21:09    Scheduled Meds: . atenolol  50 mg Oral BID  . captopril  50 mg Oral BID  . doxycycline  100 mg Oral Q12H  . ezetimibe  10 mg Oral Daily  . guaiFENesin  600 mg Oral BID  . insulin aspart  0-9 Units Subcutaneous 6 times per day  . ipratropium-albuterol  3 mL Nebulization Q6H  . lidocaine  1 patch Transdermal Q24H  . pravastatin  80 mg Oral q1800  . prednisoLONE acetate  1 drop Right Eye BID  . predniSONE  40 mg Oral Q breakfast  . sodium chloride  3 mL Intravenous Q12H  . [START ON 09/12/2015] Vitamin D (Ergocalciferol)  50,000 Units Oral Q7 days  . Warfarin - Pharmacist Dosing Inpatient   Does not apply q1800   Continuous Infusions:    Time spent: 25 minutes    Kari [name], [place]  [facility] Pager [phone]. If 7PM-7AM, please contact night-coverage at [email or web address], password [email or web address] 09/08/2015, 2:59 PM  LOS: 2 days

## 2015-09-08 NOTE — Progress Notes (Signed)
Physical Therapy Treatment Patient Details Name: Kari [name] MRN: [id number] DOB: Jan 09, 1934 Today's Date: 09/08/2015    History of Present Illness      PT Comments    HR continues to increased to 130s-140s with activity. Pt tolerated activity fairly well. Remains unsteady so encouraged pt to use RW for safety. Recommend HHPT at discharge.   Follow Up Recommendations  Home health PT;Supervision - Intermittent     Equipment Recommendations  Rolling walker with 5" wheels    Recommendations for Other Services       Precautions / Restrictions Precautions Precautions: Fall Precaution Comments: monitor sats, O2 sats Restrictions Weight Bearing Restrictions: No    Mobility  Bed Mobility Overal bed mobility: Needs Assistance Bed Mobility: Supine to Sit;Sit to Supine     Supine to sit: Modified independent (Device/Increase time) Sit to supine: Modified independent (Device/Increase time)      Transfers Overall transfer level: Needs assistance   Transfers: Sit to/from Stand Sit to Stand: Min guard         General transfer comment: close guard for safety  Ambulation/Gait Ambulation/Gait assistance: Min assist Ambulation Distance (Feet): 125 Feet Assistive device: None Gait Pattern/deviations: Step-through pattern     General Gait Details: Assist to stabilize/steady pt. Unsteady gait.  HR 130s-140s with ambulation.   Stairs            Wheelchair Mobility    Modified Rankin (Stroke Patients Only)       Balance                                    Cognition Arousal/Alertness: Awake/alert Behavior During Therapy: WFL for tasks assessed/performed Overall Cognitive Status: Within Functional Limits for tasks assessed                      Exercises General Exercises - Lower Extremity Hip Flexion/Marching: Both;10 reps;Standing;AROM Heel Raises: AROM;Both;10 reps;Standing Mini-Sqauts: AROM;10 reps;Standing    General  Comments        Pertinent Vitals/Pain      Home Living                      Prior Function            PT Goals (current goals can now be found in the care plan section) Progress towards PT goals: Progressing toward goals    Frequency  Min 3X/week    PT Plan Current plan remains appropriate    Co-evaluation             End of Session Equipment Utilized During Treatment: Gait belt Activity Tolerance: Patient tolerated treatment well Patient left: in bed;with call bell/phone within reach;with bed alarm set;with family/visitor present     Time: [id number] PT Time Calculation (min) (ACUTE ONLY): 17 min  Charges:  $Gait Training: 8-22 mins                    G Codes:      Weston Anna, MPT Pager: [phone]

## 2015-09-08 NOTE — Progress Notes (Signed)
Patient o2 sustained in the mid 90's without 02 during walk.  HR sustained in the 120's, 140's unsustained. Patient tolerated well.  MD made aware.

## 2015-09-08 NOTE — Progress Notes (Signed)
[facility] for warfarin Indication: atrial fibrillation  Allergies  Allergen Reactions  . Erythromycin Swelling and Other (See Comments)    Reaction unknown  . Penicillins Other (See Comments)    Reaction unknown  Has patient had a PCN reaction causing immediate rash, facial/tongue/throat swelling, SOB or lightheadedness with hypotension: unknown Has patient had a PCN reaction causing severe rash involving mucus membranes or skin necrosis: unknown Has patient had a PCN reaction that required hospitalization : unknown Has patient had a PCN reaction occurring within the last 10 years: no If all of the above answers are "NO", then may proceed with Cephalosporin use.     Patient Measurements: Height: 5\' 1"  (154.9 cm) Weight: 155 lb 4.8 oz (70.444 kg) IBW/kg (Calculated) : 47.8  Vital Signs: Temp: 97.5 F (36.4 C) (01/05 0421) Temp Source: Oral (01/05 0421) BP: 155/95 mmHg (01/05 0421) Pulse Rate: 112 (01/05 0421)  Labs:  Recent Labs  09/06/15 1755 09/06/15 2035 09/07/15 0152 09/07/15 0759 09/08/15 0417  HGB 11.6*  --   --   --  11.4*  HCT 38.2  --   --   --  37.2  PLT 246  --   --   --  260  LABPROT  --   --   --  33.3* 31.6*  INR  --   --   --  3.35* 3.13*  CREATININE 0.55  --   --   --  0.66  TROPONINI  --  <0.03 <0.03 <0.03  --     Estimated Creatinine Clearance: 51.1 mL/min (by C-G formula based on Cr of 0.66).   Medical History: Past Medical History  Diagnosis Date  . Obesity   . Symptomatic menopausal or female climacteric states   . Kyphosis   . Gallstones   . Adenomatous polyps   . AF (atrial fibrillation) ([place])   . Hyperlipidemia     x5 years  . Hypertension     x5 years  . Hx of adenomatous colonic polyps   . Osteoporosis   . Metabolic syndrome X   . Vitamin D deficiency     Medications:  Scheduled:  . atenolol  50 mg Oral BID  . captopril  50 mg Oral BID  . doxycycline  100 mg Oral Q12H  . ezetimibe   10 mg Oral Daily  . guaiFENesin  600 mg Oral BID  . insulin aspart  0-9 Units Subcutaneous 6 times per day  . ipratropium-albuterol  3 mL Nebulization Q6H  . lidocaine  1 patch Transdermal Q24H  . potassium chloride  40 mEq Oral Once  . pravastatin  80 mg Oral q1800  . prednisoLONE acetate  1 drop Right Eye BID  . predniSONE  40 mg Oral Q breakfast  . sodium chloride  3 mL Intravenous Q12H  . [START ON 09/12/2015] Vitamin D (Ergocalciferol)  50,000 Units Oral Q7 days  . Warfarin - Pharmacist Dosing Inpatient   Does not apply q1800   Infusions:    Assessment: 76 yoF c/o tachycardia and back pain s/p recent hospitalization with syncope and bronchitis on chronic warfarin for A-fib. INR supra-therapeutic on admission.  PTA warfarin dose: 2.5 mg daily except 1.25 on Mon/Fri.  LD 1/3 0830.   09/08/2015:  INR remains >3, but trending down CBC stable.  No bleeding noted.  Renal function stable. Doxycycline day#3 for URI  Goal of Therapy:  INR 2-3   Plan:  1) No warfarin today due to INR >  3 2) Daily INR 3) Lovenox 40mg  d/c'd on admission for elevated INR.   Netta Cedars, PharmD, BCPS Pager: [phone] 09/08/2015 8:52 AM

## 2015-09-09 DIAGNOSIS — J209 Acute bronchitis, unspecified: Secondary | ICD-10-CM | POA: Diagnosis not present

## 2015-09-09 DIAGNOSIS — R0902 Hypoxemia: Secondary | ICD-10-CM | POA: Diagnosis not present

## 2015-09-09 DIAGNOSIS — I10 Essential (primary) hypertension: Secondary | ICD-10-CM | POA: Diagnosis not present

## 2015-09-09 DIAGNOSIS — E876 Hypokalemia: Secondary | ICD-10-CM | POA: Diagnosis not present

## 2015-09-09 DIAGNOSIS — I4891 Unspecified atrial fibrillation: Secondary | ICD-10-CM | POA: Diagnosis not present

## 2015-09-09 DIAGNOSIS — J40 Bronchitis, not specified as acute or chronic: Secondary | ICD-10-CM | POA: Diagnosis not present

## 2015-09-09 DIAGNOSIS — I119 Hypertensive heart disease without heart failure: Secondary | ICD-10-CM | POA: Diagnosis not present

## 2015-09-09 DIAGNOSIS — R062 Wheezing: Secondary | ICD-10-CM | POA: Diagnosis not present

## 2015-09-09 DIAGNOSIS — I482 Chronic atrial fibrillation: Secondary | ICD-10-CM | POA: Diagnosis not present

## 2015-09-09 DIAGNOSIS — M545 Low back pain: Secondary | ICD-10-CM | POA: Diagnosis not present

## 2015-09-09 DIAGNOSIS — E785 Hyperlipidemia, unspecified: Secondary | ICD-10-CM | POA: Diagnosis not present

## 2015-09-09 LAB — PROTIME-INR
INR: 3 — AB (ref 0.00–1.49)
PROTHROMBIN TIME: 30.6 s — AB (ref 11.6–15.2)

## 2015-09-09 LAB — GLUCOSE, CAPILLARY
GLUCOSE-CAPILLARY: 125 mg/dL — AB (ref 65–99)
GLUCOSE-CAPILLARY: 88 mg/dL (ref 65–99)
GLUCOSE-CAPILLARY: 90 mg/dL (ref 65–99)

## 2015-09-09 MED ORDER — METOPROLOL TARTRATE 75 MG PO TABS: 75.0000 mg | ORAL_TABLET | Freq: Two times a day (BID) | ORAL | Status: DC

## 2015-09-09 NOTE — Discharge Summary (Signed)
Physician Discharge Summary  Kari [name] [id number] DOB: Mar 27, 1934 DOA: 09/06/2015  PCP: Redge Gainer, MD  Admit date: 09/06/2015 Discharge date: 09/09/2015  Time spent: 25 minutes  Recommendations for Outpatient Follow-up:  1. Follow up with PCP as scheduled. Pt will complete course of doxycycline and prednisone as prescribed during last hospital  discharge.   Discharge Diagnoses:  Principal Problem:   Acute recurrent Bronchitis  Active Problems:   Essential hypertension   Atrial fibrillation, unspecified   Hypoxia   Hyperlipidemia   Low back pain   Discharge Condition: fair  Diet recommendation: low sodium  Filed Weights   09/07/15 1256  Weight: 70.444 kg (155 lb 4.8 oz)    History of present illness:  82 year female with history of A. fib on warfarin, hypertension recently hospitalized under observation after presenting with orthostatic syncope and acute bronchitis and discharged home on prednisone and doxycycline (on 09/05/2015) return back to the ED with complaint of low back pain and some persistent shortness of breath. In the ED she had a heart rate in-140 and was started on Cardizem drip for a brief duration. Admitted to hospitalist service.  Hospital Course:  Acute persistent bronchitis Improved with  scheduled nebs, oral prednisone and doxycycline. Continue Mucinex. Patient ambulatory in the hallway on room air but does get tachycardic with heart rate sustaining 120s. HR better today. Symptoms much improved. Stable for d/c home. Will complete course of prednisone and doxycyline as prescribed during recent hospital discharge.    Chronic A. fib Heart rate stable at rest but worsens to 120s occasionally nonsustained to 140s. Continue home dose of atenolol. If heart rate continues to be elevated I will add Cardizem. Continue Coumadin. Dosing per pharmacy.  Low back pain Appears to musculoskeletal. Much improved with Lidoderm patch. Continue when necessary  Toradol. Seen by PT and recommend home health.  Hypertension Blood pressure mildly elevated. Switched atenolol to metop[rolol 75 mg bid for better HR and BP control.  continue captopril. Valsartan-HCTZ   recently discontinued due to orthostatic syncope.  Hypokalemia Replenished.   Hyperlipidemia Continue statin and Zetia  Prediabetes Stable.  Code Status: Full code Family Communication: husband and son  at bedside Disposition Plan: Home    Consultants:  None  Procedures:  None  Antibiotics:  Doxycycline  Discharge Exam: Filed Vitals:   09/09/15 0148 09/09/15 0430  BP:  157/93  Pulse: 92 71  Temp:  98 F (36.7 C)  Resp: 18 18     General: Elderly female not in distress  Cardiovascular: S1 and S2 irregular, no murmur rub or gallop  Respiratory: scattered rhonchi bilaterally  Abdomen: Soft, nondistended, nontender  Musculoskeletal: Warm, no edema  Discharge Instructions    Current Discharge Medication List    START taking these medications   Details  albuterol (PROVENTIL HFA;VENTOLIN HFA) 108 (90 Base) MCG/ACT inhaler Inhale 2 puffs into the lungs every 6 (six) hours as needed for wheezing or shortness of breath. Qty: 1 Inhaler, Refills: 3    lidocaine (LIDODERM) 5 % Place 1 patch onto the skin daily. Remove & Discard patch within 12 hours or as directed by MD Qty: 10 patch, Refills: 0    Metoprolol Tartrate 75 MG TABS Take 75 mg by mouth 2 (two) times daily. Qty: 60 tablet, Refills: 0    traMADol (ULTRAM) 50 MG tablet Take 1 tablet (50 mg total) by mouth every 6 (six) hours as needed for moderate pain (or Headache unrelieved by tylenol). Qty: 20 tablet, Refills: 0  CONTINUE these medications which have NOT CHANGED   Details  captopril (CAPOTEN) 50 MG tablet TAKE (1) TABLET TWICE A DAY FOR HIGH BLOOD PRESSURE. Qty: 60 tablet, Refills: 11    doxycycline (VIBRA-TABS) 100 MG tablet Take 1 tablet (100 mg total) by mouth every 12 (twelve)  hours. Qty: 10 tablet, Refills: 0    ezetimibe (ZETIA) 10 MG tablet TAKE 1 TABLET ONCE DAILY FOR CHOLESTEROL Qty: 30 tablet, Refills: 11    guaiFENesin (MUCINEX) 600 MG 12 hr tablet Take 1 tablet (600 mg total) by mouth 2 (two) times daily. Qty: 10 tablet, Refills: 0    guaiFENesin-dextromethorphan (ROBITUSSIN DM) 100-10 MG/5ML syrup Take 5 mLs by mouth every 4 (four) hours as needed for cough. Qty: 118 mL, Refills: 0    pravastatin (PRAVACHOL) 80 MG tablet TAKE 1 TABLET ONCE DAILY FOR CHOLESTEROL Qty: 30 tablet, Refills: 11    prednisoLONE acetate (PRED FORTE) 1 % ophthalmic suspension Place 1 drop into the right eye 2 (two) times daily.     predniSONE (DELTASONE) 20 MG tablet Take 2 tablets (40 mg total) by mouth daily with breakfast. Qty: 10 tablet, Refills: 0    Vitamin D, Ergocalciferol, (DRISDOL) 50000 UNITS CAPS capsule Take 1 capsule (50,000 Units total) by mouth every 7 (seven) days. Qty: 12 capsule, Refills: 0    warfarin (COUMADIN) 2.5 MG tablet TAKE 1 TABLET ONCE DAILY OR AS DIRECTED Qty: 30 tablet, Refills: 11      STOP taking these medications     atenolol (TENORMIN) 50 MG tablet        Allergies  Allergen Reactions  . Erythromycin Swelling and Other (See Comments)    Reaction unknown  . Penicillins Other (See Comments)    Reaction unknown  Has patient had a PCN reaction causing immediate rash, facial/tongue/throat swelling, SOB or lightheadedness with hypotension: unknown Has patient had a PCN reaction causing severe rash involving mucus membranes or skin necrosis: unknown Has patient had a PCN reaction that required hospitalization : unknown Has patient had a PCN reaction occurring within the last 10 years: no If all of the above answers are "NO", then may proceed with Cephalosporin use.    Follow-up Information    Follow up with Redge Gainer, MD On 09/29/2015.   Specialty:  Family Medicine   Contact information:   [place]  [phone]        The results of significant diagnostics from this hospitalization (including imaging, microbiology, ancillary and laboratory) are listed below for reference.    Significant Diagnostic Studies: Dg Chest 2 View  09/04/2015  CLINICAL DATA:  Syncope. EXAM: CHEST  2 VIEW COMPARISON:  05/03/2015 FINDINGS: Heart size and pulmonary vascularity are normal. No acute infiltrates or effusions. Minimal scarring at the left lung base laterally. Calcification in the thoracic aorta. No acute osseous abnormality. IMPRESSION: No acute disease.  Aortic atherosclerosis. Electronically Signed   By: Lorriane Shire M.D.   On: 09/04/2015 16:59   Ct Head Wo Contrast  09/04/2015  CLINICAL DATA:  82 year old hypertensive female passed out while sitting in chair. Atrial fibrillation on blood thinner. Initial encounter. EXAM: CT HEAD WITHOUT CONTRAST CT CERVICAL SPINE WITHOUT CONTRAST TECHNIQUE: Multidetector CT imaging of the head and cervical spine was performed following the standard protocol without intravenous contrast. Multiplanar CT image reconstructions of the cervical spine were also generated. COMPARISON:  None. FINDINGS: CT HEAD FINDINGS No skull fracture or intracranial hemorrhage. Prominent small vessel disease type changes. Small infarct  left lenticular nucleus of indeterminate age. No CT evidence of large acute infarct. Vascular calcifications. Mild global atrophy without hydrocephalus. No intracranial mass lesion noted on this unenhanced exam. Partial opacification ethmoid sinus air cells with polypoid mucosal thickening maxillary sinuses. Post right lens replacement otherwise orbital structures unremarkable. CT CERVICAL SPINE FINDINGS No cervical spine fracture, malalignment or abnormal prevertebral soft tissue swelling. Cervical spondylotic changes most prominent C5-6. Carotid bifurcation calcifications. No worrisome neck mass or lung apical lesion identified. IMPRESSION: CT HEAD No skull  fracture or intracranial hemorrhage. Prominent small vessel disease type changes. Small infarct left lenticular nucleus of indeterminate age. No CT evidence of large acute infarct. Mild global atrophy without hydrocephalus. Partial opacification ethmoid sinus air cells with polypoid mucosal thickening maxillary sinuses. CT CERVICAL SPINE No cervical spine fracture, malalignment or abnormal prevertebral soft tissue swelling. Cervical spondylotic changes most prominent C5-6. Electronically Signed   By: Genia Del M.D.   On: 09/04/2015 17:14   Ct Cervical Spine Wo Contrast  09/04/2015  CLINICAL DATA:  82 year old hypertensive female passed out while sitting in chair. Atrial fibrillation on blood thinner. Initial encounter. EXAM: CT HEAD WITHOUT CONTRAST CT CERVICAL SPINE WITHOUT CONTRAST TECHNIQUE: Multidetector CT imaging of the head and cervical spine was performed following the standard protocol without intravenous contrast. Multiplanar CT image reconstructions of the cervical spine were also generated. COMPARISON:  None. FINDINGS: CT HEAD FINDINGS No skull fracture or intracranial hemorrhage. Prominent small vessel disease type changes. Small infarct left lenticular nucleus of indeterminate age. No CT evidence of large acute infarct. Vascular calcifications. Mild global atrophy without hydrocephalus. No intracranial mass lesion noted on this unenhanced exam. Partial opacification ethmoid sinus air cells with polypoid mucosal thickening maxillary sinuses. Post right lens replacement otherwise orbital structures unremarkable. CT CERVICAL SPINE FINDINGS No cervical spine fracture, malalignment or abnormal prevertebral soft tissue swelling. Cervical spondylotic changes most prominent C5-6. Carotid bifurcation calcifications. No worrisome neck mass or lung apical lesion identified. IMPRESSION: CT HEAD No skull fracture or intracranial hemorrhage. Prominent small vessel disease type changes. Small infarct left  lenticular nucleus of indeterminate age. No CT evidence of large acute infarct. Mild global atrophy without hydrocephalus. Partial opacification ethmoid sinus air cells with polypoid mucosal thickening maxillary sinuses. CT CERVICAL SPINE No cervical spine fracture, malalignment or abnormal prevertebral soft tissue swelling. Cervical spondylotic changes most prominent C5-6. Electronically Signed   By: Genia Del M.D.   On: 09/04/2015 17:14   Dg Chest Portable 1 View  09/06/2015  CLINICAL DATA:  Pt had a syncopal episode 2 days ago and was admitted to hospital, was release 1 day ago - hx Afib - pt reports back pain that began today , cough and wheezing x 4 days EXAM: PORTABLE CHEST 1 VIEW COMPARISON:  09/04/2015 FINDINGS: Shallow lung inflation. Heart size is mildly enlarged. There is perihilar peribronchial thickening. There are no focal consolidations. No definite pleural effusions. IMPRESSION: Bronchitic thickening.  Cardiomegaly. Electronically Signed   By: Nolon Nations M.D.   On: 09/06/2015 21:09    Microbiology: No results found for this or any previous visit (from the past 240 hour(s)).   Labs: Basic Metabolic Panel:  Recent Labs Lab 09/04/15 1630 09/05/15 0504 09/06/15 1755 09/08/15 0417 09/08/15 1530  NA 139 141 138 141  --   K 3.2* 3.8 3.8 3.2*  --   CL 99* 109 107 105  --   CO2  --  24 23 26   --   GLUCOSE 156* 99 165* 97  --  BUN 14 9 8 8   --   CREATININE 0.90 0.58 0.55 0.66  --   CALCIUM  --  8.4* 9.1 9.3  --   MG  --   --   --   --  1.8   Liver Function Tests: No results for input(s): AST, ALT, ALKPHOS, BILITOT, PROT, ALBUMIN in the last 168 hours. No results for input(s): LIPASE, AMYLASE in the last 168 hours. No results for input(s): AMMONIA in the last 168 hours. CBC:  Recent Labs Lab 09/04/15 1622 09/04/15 1630 09/06/15 1755 09/08/15 0417  WBC 11.7*  --  13.8* 10.4  NEUTROABS 9.5*  --   --   --   HGB 12.3 15.0 11.6* 11.4*  HCT 40.0 44.0 38.2 37.2   MCV 82.6  --  83.4 82.1  PLT 216  --  246 260   Cardiac Enzymes:  Recent Labs Lab 09/06/15 2035 09/07/15 0152 09/07/15 0759  TROPONINI <0.03 <0.03 <0.03   BNP: BNP (last 3 results)  Recent Labs  09/06/15 1751  BNP 426.0*    ProBNP (last 3 results) No results for input(s): PROBNP in the last 8760 hours.  CBG:  Recent Labs Lab 09/08/15 1631 09/08/15 2048 09/09/15 0037 09/09/15 0425 09/09/15 0801  GLUCAP 188* 117* 125* 88 90       Signed:  Louellen Molder MD   [facility] 09/09/2015, 9:25 AM

## 2015-09-09 NOTE — Progress Notes (Signed)
[facility] for warfarin Indication: atrial fibrillation  Allergies  Allergen Reactions  . Erythromycin Swelling and Other (See Comments)    Reaction unknown  . Penicillins Other (See Comments)    Reaction unknown  Has patient had a PCN reaction causing immediate rash, facial/tongue/throat swelling, SOB or lightheadedness with hypotension: unknown Has patient had a PCN reaction causing severe rash involving mucus membranes or skin necrosis: unknown Has patient had a PCN reaction that required hospitalization : unknown Has patient had a PCN reaction occurring within the last 10 years: no If all of the above answers are "NO", then may proceed with Cephalosporin use.     Patient Measurements: Height: 5\' 1"  (154.9 cm) Weight: 155 lb 4.8 oz (70.444 kg) IBW/kg (Calculated) : 47.8  Vital Signs: Temp: 98 F (36.7 C) (01/06 0430) Temp Source: Oral (01/06 0430) BP: 157/93 mmHg (01/06 0430) Pulse Rate: 71 (01/06 0430)  Labs:  Recent Labs  09/06/15 1755 09/06/15 2035 09/07/15 0152 09/07/15 0759 09/08/15 0417 09/09/15 0415  HGB 11.6*  --   --   --  11.4*  --   HCT 38.2  --   --   --  37.2  --   PLT 246  --   --   --  260  --   LABPROT  --   --   --  33.3* 31.6* 30.6*  INR  --   --   --  3.35* 3.13* 3.00*  CREATININE 0.55  --   --   --  0.66  --   TROPONINI  --  <0.03 <0.03 <0.03  --   --     Estimated Creatinine Clearance: 51.1 mL/min (by C-G formula based on Cr of 0.66).   Medical History: Past Medical History  Diagnosis Date  . Obesity   . Symptomatic menopausal or female climacteric states   . Kyphosis   . Gallstones   . Adenomatous polyps   . AF (atrial fibrillation) ([place])   . Hyperlipidemia     x5 years  . Hypertension     x5 years  . Hx of adenomatous colonic polyps   . Osteoporosis   . Metabolic syndrome X   . Vitamin D deficiency     Medications:  PTA warfarin dose: 2.5 mg daily except 1.25 on Mon/Fri.  LD 1/3  0830.   Assessment: 37 yoF c/o tachycardia and back pain s/p recent hospitalization with syncope and bronchitis on chronic warfarin for A-fib. INR supra-therapeutic on admission.   09/09/2015:  INR 3.0  CBC stable.  No bleeding noted.  Renal function stable Doxycycline day#4 for URI Cardiac diet ordered- patient eating 60-100%  Goal of Therapy:  INR 2-3   Plan:  1) Hold warfarin x1 more day 2) Daily INR 3) If discharge home, recommend resume previous home regimen tomorrow 09/10/15 (she has been therapeutic on this regimen >38months per outpatient flowsheet)  Netta Cedars, PharmD, BCPS Pager: [phone] 09/09/2015 9:31 AM

## 2015-09-13 DIAGNOSIS — H25012 Cortical age-related cataract, left eye: Secondary | ICD-10-CM | POA: Insufficient documentation

## 2015-09-13 DIAGNOSIS — H2512 Age-related nuclear cataract, left eye: Secondary | ICD-10-CM | POA: Insufficient documentation

## 2015-09-20 DIAGNOSIS — Z79899 Other long term (current) drug therapy: Secondary | ICD-10-CM | POA: Diagnosis not present

## 2015-09-20 DIAGNOSIS — H2512 Age-related nuclear cataract, left eye: Secondary | ICD-10-CM | POA: Diagnosis not present

## 2015-09-20 DIAGNOSIS — Z90710 Acquired absence of both cervix and uterus: Secondary | ICD-10-CM | POA: Diagnosis not present

## 2015-09-20 DIAGNOSIS — Z883 Allergy status to other anti-infective agents status: Secondary | ICD-10-CM | POA: Diagnosis not present

## 2015-09-20 DIAGNOSIS — I4891 Unspecified atrial fibrillation: Secondary | ICD-10-CM | POA: Diagnosis not present

## 2015-09-20 DIAGNOSIS — E785 Hyperlipidemia, unspecified: Secondary | ICD-10-CM | POA: Diagnosis not present

## 2015-09-20 DIAGNOSIS — Z7901 Long term (current) use of anticoagulants: Secondary | ICD-10-CM | POA: Diagnosis not present

## 2015-09-20 DIAGNOSIS — Z880 Allergy status to penicillin: Secondary | ICD-10-CM | POA: Diagnosis not present

## 2015-09-20 DIAGNOSIS — I10 Essential (primary) hypertension: Secondary | ICD-10-CM | POA: Diagnosis not present

## 2015-09-20 DIAGNOSIS — H3530 Unspecified macular degeneration: Secondary | ICD-10-CM | POA: Diagnosis not present

## 2015-09-20 DIAGNOSIS — H25812 Combined forms of age-related cataract, left eye: Secondary | ICD-10-CM | POA: Diagnosis not present

## 2015-09-20 DIAGNOSIS — H25012 Cortical age-related cataract, left eye: Secondary | ICD-10-CM | POA: Diagnosis not present

## 2015-09-21 ENCOUNTER — Encounter: Payer: Self-pay | Admitting: Pharmacist

## 2015-09-28 ENCOUNTER — Ambulatory Visit (INDEPENDENT_AMBULATORY_CARE_PROVIDER_SITE_OTHER): Payer: Medicare Other | Admitting: Pharmacist

## 2015-09-28 ENCOUNTER — Ambulatory Visit: Payer: Self-pay | Admitting: Family Medicine

## 2015-09-28 DIAGNOSIS — I4891 Unspecified atrial fibrillation: Secondary | ICD-10-CM | POA: Diagnosis not present

## 2015-09-28 LAB — POCT INR: INR: 2.8

## 2015-09-28 NOTE — Patient Instructions (Signed)
Anticoagulation Dose Instructions as of 09/28/2015      Kari [name] Tue Wed Thu Fri Sat   New Dose 2.5 mg 1.25 mg 2.5 mg 2.5 mg 2.5 mg 1.25 mg 2.5 mg    Description        Continue warfarin dose of 2.5mg  - take 1/2 tablet on mondays and fridays.  Take 1 tablet all other days.     INR was 2.8 today

## 2015-09-30 ENCOUNTER — Encounter: Payer: Self-pay | Admitting: Family Medicine

## 2015-09-30 ENCOUNTER — Ambulatory Visit (INDEPENDENT_AMBULATORY_CARE_PROVIDER_SITE_OTHER): Payer: Medicare Other | Admitting: Family Medicine

## 2015-09-30 ENCOUNTER — Ambulatory Visit: Payer: Self-pay | Admitting: Family Medicine

## 2015-09-30 VITALS — BP 142/80 | HR 68 | Temp 97.4°F | Ht 61.0 in | Wt 154.0 lb

## 2015-09-30 DIAGNOSIS — R638 Other symptoms and signs concerning food and fluid intake: Secondary | ICD-10-CM | POA: Insufficient documentation

## 2015-09-30 DIAGNOSIS — E876 Hypokalemia: Secondary | ICD-10-CM | POA: Diagnosis not present

## 2015-09-30 DIAGNOSIS — I10 Essential (primary) hypertension: Secondary | ICD-10-CM | POA: Diagnosis not present

## 2015-09-30 DIAGNOSIS — R55 Syncope and collapse: Secondary | ICD-10-CM

## 2015-09-30 DIAGNOSIS — I4819 Other persistent atrial fibrillation: Secondary | ICD-10-CM

## 2015-09-30 DIAGNOSIS — E785 Hyperlipidemia, unspecified: Secondary | ICD-10-CM | POA: Diagnosis not present

## 2015-09-30 DIAGNOSIS — I481 Persistent atrial fibrillation: Secondary | ICD-10-CM

## 2015-09-30 DIAGNOSIS — E559 Vitamin D deficiency, unspecified: Secondary | ICD-10-CM | POA: Diagnosis not present

## 2015-09-30 DIAGNOSIS — I4891 Unspecified atrial fibrillation: Secondary | ICD-10-CM | POA: Diagnosis not present

## 2015-09-30 DIAGNOSIS — I700 Atherosclerosis of aorta: Secondary | ICD-10-CM

## 2015-09-30 NOTE — Progress Notes (Signed)
Subjective:    Patient ID: Kari [name], female    DOB: 05/29/1934, 82 y.o.   MRN: [id number]  HPI Pt here for follow up and management of chronic medical problems which includes a fib, hypertension and hyperlipidemia. She is taking medications regularly. The patient recently was in the emergency room at the hospital after an episode of syncope. Her amlodipine and metoprolol were discontinued. She also had a diagnosis of hypokalemia. Please see discharge summary. The patient is not sure why she passed out. She had no symptoms other than just feeling bad and best when she passed out husband called the ambulance and she was taken to the emergency room. She spent the night. Said she's been back at home she's had no further symptoms or problems with chest pain shortness of breath or passing out or lightheadedness. She denies any GI symptoms or voiding symptoms. Her pulse ox upon arriving this morning was 99%. It is important to note that the patient has recently had bilateral cataract surgery and she's been using drops in her eyes. The day this happened she had eaten that morning and that day at lunch and was doing nothing but sitting and doing some writing when this passing out episode occurred.     Patient Active Problem List   Diagnosis Date Noted  . Increased BMI 09/30/2015  . Hyperlipidemia 09/07/2015  . Low back pain 09/07/2015  . Wheezing   . Hypoxia 09/06/2015  . Bronchitis 09/06/2015  . Acute bronchitis 09/05/2015  . Acute upper respiratory infection 09/05/2015  . Orthostatic syncope 09/04/2015  . Orthostatic hypotension 09/04/2015  . Thoracic aortic atherosclerosis ([place]) 05/03/2015  . Vitamin D deficiency 02/02/2015  . Pre-diabetes 02/02/2015  . Metabolic syndrome 63/89/3734  . Osteoporosis 03/11/2013  . Atrial fibrillation, unspecified 11/24/2012  . Hypokalemia 05/04/2012  . Atypical chest pain 05/04/2012  . Obesity   . Symptomatic menopausal or female climacteric states     . Kyphosis   . Gallstones   . Personal history of adenomatous colonic polyps   . HYPERLIPIDEMIA 03/29/2009  . Essential hypertension 03/29/2009   Outpatient Encounter Prescriptions as of 09/30/2015  Medication Sig  . albuterol (PROVENTIL HFA;VENTOLIN HFA) 108 (90 Base) MCG/ACT inhaler Inhale 2 puffs into the lungs every 6 (six) hours as needed for wheezing or shortness of breath.  Marland Kitchen atenolol (TENORMIN) 50 MG tablet Take by mouth.  . captopril (CAPOTEN) 50 MG tablet TAKE (1) TABLET TWICE A DAY FOR HIGH BLOOD PRESSURE.  Marland Kitchen ezetimibe (ZETIA) 10 MG tablet TAKE 1 TABLET ONCE DAILY FOR CHOLESTEROL  . ketorolac (ACULAR) 0.4 % SOLN Apply to eye.  . pravastatin (PRAVACHOL) 80 MG tablet TAKE 1 TABLET ONCE DAILY FOR CHOLESTEROL  . warfarin (COUMADIN) 2.5 MG tablet TAKE 1 TABLET ONCE DAILY OR AS DIRECTED (Patient taking differently: Take 1.25-2.5 mg by mouth daily. Take 2.35m 5 days a week except on Monday and Friday take 1.246m)  . [DISCONTINUED] guaiFENesin (MUCINEX) 600 MG 12 hr tablet Take 1 tablet (600 mg total) by mouth 2 (two) times daily.  . [DISCONTINUED] guaiFENesin-dextromethorphan (ROBITUSSIN DM) 100-10 MG/5ML syrup Take 5 mLs by mouth every 4 (four) hours as needed for cough.  . [DISCONTINUED] lidocaine (LIDODERM) 5 % Place 1 patch onto the skin daily. Remove & Discard patch within 12 hours or as directed by MD  . [DISCONTINUED] Metoprolol Tartrate 75 MG TABS Take 75 mg by mouth 2 (two) times daily.  . [DISCONTINUED] prednisoLONE acetate (PRED FORTE) 1 % ophthalmic suspension Apply to  eye.  . [DISCONTINUED] traMADol (ULTRAM) 50 MG tablet Take 1 tablet (50 mg total) by mouth every 6 (six) hours as needed for moderate pain (or Headache unrelieved by tylenol).  . [DISCONTINUED] Vitamin D, Ergocalciferol, (DRISDOL) 50000 UNITS CAPS capsule Take 1 capsule (50,000 Units total) by mouth every 7 (seven) days.   No facility-administered encounter medications on file as of 09/30/2015.      Review  of Systems  Constitutional: Negative.   HENT: Negative.   Eyes: Negative.   Respiratory: Negative.   Cardiovascular: Negative.   Gastrointestinal: Negative.   Endocrine: Negative.   Genitourinary: Negative.   Musculoskeletal: Negative.   Skin: Negative.   Allergic/Immunologic: Negative.   Neurological: Negative.   Hematological: Negative.   Psychiatric/Behavioral: Negative.        Objective:   Physical Exam  Constitutional: She is oriented to person, place, and time. She appears well-developed and well-nourished. No distress.  The patient is alert and pleasant.  HENT:  Head: Normocephalic and atraumatic.  Right Ear: External ear normal.  Left Ear: External ear normal.  Nose: Nose normal.  Mouth/Throat: Oropharynx is clear and moist. No oropharyngeal exudate.  Eyes: Conjunctivae and EOM are normal. Pupils are equal, round, and reactive to light. Right eye exhibits no discharge. Left eye exhibits no discharge. No scleral icterus.  Neck: Normal range of motion. Neck supple. No thyromegaly present.  No bruits or anterior cervical adenopathy  Cardiovascular: Normal rate, normal heart sounds and intact distal pulses.  Exam reveals no friction rub.   No murmur heard. Irregular irregular at 72/m  Pulmonary/Chest: Effort normal and breath sounds normal. No respiratory distress. She has no wheezes. She has no rales. She exhibits no tenderness.  Abdominal: Soft. Bowel sounds are normal. She exhibits no mass. There is no tenderness. There is no rebound and no guarding.  No abdominal tenderness or masses  Musculoskeletal: Normal range of motion. She exhibits no edema or tenderness.  Lymphadenopathy:    She has no cervical adenopathy.  Neurological: She is alert and oriented to person, place, and time. She has normal reflexes. No cranial nerve deficit.  Skin: Skin is warm and dry. No rash noted.  Psychiatric: She has a normal mood and affect. Her behavior is normal. Judgment and thought  content normal.  Nursing note and vitals reviewed.  BP 157/72 mmHg  Pulse 68  Temp(Src) 97.4 F (36.3 C) (Oral)  Ht _0  (1.549 m)  Wt 154 lb (69.854 kg)  BMI 29.11 kg/m2  Repeat blood pressure 142/80 right arm sitting regular cuff      Assessment & Plan:  1. Vitamin D deficiency -Continue current treatment pending results of lab work - CBC with Differential/Platelet - VITAMIN D 25 Hydroxy (Vit-D Deficiency, Fractures)  2. Persistent atrial fibrillation ([place]) -Continue current treatment pending results of lab work - CBC with Differential/Platelet  3. Thoracic aortic atherosclerosis ([place]) -Continue with aggressive therapeutic lifestyle changes cholesterol and weight management. This was discussed with the patient today. Also discussed with her watching her sodium intake. - CBC with Differential/Platelet - Ambulatory referral to Cardiology  4. Hyperlipemia -Continue with current treatment pending results of lab work - CBC with Differential/Platelet - Lipid panel - Ambulatory referral to Cardiology  5. Essential hypertension -The blood pressure was slightly elevated today but we will not make any changes and have her come back to the office in a couple weeks with home blood pressure readings and have the nurse check her blood pressure here with her cuff  and she will be checking her blood pressure with her cuff for comparison. - BMP8+EGFR - CBC with Differential/Platelet - Hepatic function panel - Ambulatory referral to Cardiology  6. Hyperlipidemia -Continue current treatment pending results of lab work  7. Atrial fibrillation, unspecified -Continue follow-up with cardiology and especially since this episode of syncope. - Ambulatory referral to Cardiology  8. Hypokalemia -This will be checked today - Ambulatory referral to Cardiology  9. Syncope, unspecified syncope type -No further episodes of syncope and the patient was cautioned to avoid any climbing and  follow-up as planned  Patient Instructions                       Medicare Annual Wellness Visit  [facility] and the medical providers at [facility] strive to bring you the best medical care.  In doing so we not only want to address your current medical conditions and concerns but also to detect new conditions early and prevent illness, disease and health-related problems.    Medicare offers a yearly Wellness Visit which allows our clinical staff to assess your need for preventative services including immunizations, lifestyle education, counseling to decrease risk of preventable diseases and screening for fall risk and other medical concerns.    This visit is provided free of charge (no copay) for all Medicare recipients. The clinical pharmacists at [facility] have begun to conduct these Wellness Visits which will also include a thorough review of all your medications.    As you primary medical provider recommend that you make an appointment for your Annual Wellness Visit if you have not done so already this year.  You may set up this appointment before you leave today or you may call back [phone] and schedule an appointment.  Please make sure when you call that you mention that you are scheduling your Annual Wellness Visit with the clinical pharmacist so that the appointment may be made for the proper length of time.     Continue current medications. Continue good therapeutic lifestyle changes which include good diet and exercise. Fall precautions discussed with patient. If an FOBT was given today- please return it to our front desk. If you are over 86 years old - you may need Prevnar 40 or the adult Pneumonia vaccine.  **Flu shots are available--- please call and schedule a FLU-CLINIC appointment**  After your visit with [place] today you will receive a survey in the mail or online from [employer] regarding your care with [place]. Please take a moment  to fill this out. Your feedback is very important to [place] as you can help [place] better understand your patient needs as well as improve your experience and satisfaction. WE CARE ABOUT YOU!!!   The patient should bring blood pressure readings act in a couple weeks for review and bring her monitor here and have her check her blood pressure here and our nurse will check her blood pressure with our monitor for comparison. She should stay off of the medications that were discontinued in the emergency room She should continue to watch her diet and sodium intake She should avoid any climbing We will make sure that she has a follow-up visit with her cardiologist sometime soon   Arrie Senate MD

## 2015-09-30 NOTE — Patient Instructions (Addendum)
Medicare Annual Wellness Visit  [facility] and the medical providers at [facility] strive to bring you the best medical care.  In doing so we not only want to address your current medical conditions and concerns but also to detect new conditions early and prevent illness, disease and health-related problems.    Medicare offers a yearly Wellness Visit which allows our clinical staff to assess your need for preventative services including immunizations, lifestyle education, counseling to decrease risk of preventable diseases and screening for fall risk and other medical concerns.    This visit is provided free of charge (no copay) for all Medicare recipients. The clinical pharmacists at [facility] have begun to conduct these Wellness Visits which will also include a thorough review of all your medications.    As you primary medical provider recommend that you make an appointment for your Annual Wellness Visit if you have not done so already this year.  You may set up this appointment before you leave today or you may call back [phone] and schedule an appointment.  Please make sure when you call that you mention that you are scheduling your Annual Wellness Visit with the clinical pharmacist so that the appointment may be made for the proper length of time.     Continue current medications. Continue good therapeutic lifestyle changes which include good diet and exercise. Fall precautions discussed with patient. If an FOBT was given today- please return it to our front desk. If you are over 63 years old - you may need Prevnar 70 or the adult Pneumonia vaccine.  **Flu shots are available--- please call and schedule a FLU-CLINIC appointment**  After your visit with [place] today you will receive a survey in the mail or online from [employer] regarding your care with [place]. Please take a moment to fill this out. Your feedback is very  important to [place] as you can help [place] better understand your patient needs as well as improve your experience and satisfaction. WE CARE ABOUT YOU!!!   The patient should bring blood pressure readings act in a couple weeks for review and bring her monitor here and have her check her blood pressure here and our nurse will check her blood pressure with our monitor for comparison. She should stay off of the medications that were discontinued in the emergency room She should continue to watch her diet and sodium intake She should avoid any climbing We will make sure that she has a follow-up visit with her cardiologist sometime soon

## 2015-10-01 LAB — BMP8+EGFR
BUN/Creatinine Ratio: 10 — ABNORMAL LOW (ref 11–26)
BUN: 7 mg/dL — AB (ref 8–27)
CHLORIDE: 102 mmol/L (ref 96–106)
CO2: 25 mmol/L (ref 18–29)
Calcium: 8.9 mg/dL (ref 8.7–10.3)
Creatinine, Ser: 0.71 mg/dL (ref 0.57–1.00)
GFR calc Af Amer: 94 mL/min/{1.73_m2} (ref >59)
GFR calc non Af Amer: 81 mL/min/{1.73_m2} (ref >59)
GLUCOSE: 110 mg/dL — AB (ref 65–99)
Potassium: 3.7 mmol/L (ref 3.5–5.2)
SODIUM: 142 mmol/L (ref 134–144)

## 2015-10-01 LAB — HEPATIC FUNCTION PANEL
ALBUMIN: 3.6 g/dL (ref 3.5–4.8)
ALT: 14 IU/L (ref 0–32)
AST: 21 IU/L (ref 0–40)
Alkaline Phosphatase: 74 IU/L (ref 39–117)
Bilirubin Total: 0.5 mg/dL (ref 0.0–1.2)
Bilirubin, Direct: 0.17 mg/dL (ref 0.00–0.40)
TOTAL PROTEIN: 6.3 g/dL (ref 6.0–8.5)

## 2015-10-01 LAB — CBC WITH DIFFERENTIAL/PLATELET
BASOS ABS: 0 10*3/uL (ref 0.0–0.2)
Basos: 0 %
EOS (ABSOLUTE): 0.2 10*3/uL (ref 0.0–0.4)
Eos: 2 %
Hematocrit: 39.1 % (ref 34.0–46.6)
Hemoglobin: 12.1 g/dL (ref 11.1–15.9)
Immature Grans (Abs): 0 10*3/uL (ref 0.0–0.1)
Immature Granulocytes: 0 %
LYMPHS ABS: 1.3 10*3/uL (ref 0.7–3.1)
Lymphs: 18 %
MCH: 26.4 pg — ABNORMAL LOW (ref 26.6–33.0)
MCHC: 30.9 g/dL — AB (ref 31.5–35.7)
MCV: 85 fL (ref 79–97)
MONOS ABS: 0.5 10*3/uL (ref 0.1–0.9)
Monocytes: 7 %
Neutrophils Absolute: 5.1 10*3/uL (ref 1.4–7.0)
Neutrophils: 73 %
PLATELETS: 199 10*3/uL (ref 150–379)
RBC: 4.58 x10E6/uL (ref 3.77–5.28)
RDW: 17.5 % — AB (ref 12.3–15.4)
WBC: 7.1 10*3/uL (ref 3.4–10.8)

## 2015-10-01 LAB — LIPID PANEL
CHOL/HDL RATIO: 3 ratio (ref 0.0–4.4)
Cholesterol, Total: 136 mg/dL (ref 100–199)
HDL: 45 mg/dL (ref >39)
LDL CALC: 70 mg/dL (ref 0–99)
TRIGLYCERIDES: 105 mg/dL (ref 0–149)
VLDL Cholesterol Cal: 21 mg/dL (ref 5–40)

## 2015-10-01 LAB — VITAMIN D 25 HYDROXY (VIT D DEFICIENCY, FRACTURES): Vit D, 25-Hydroxy: 24.2 ng/mL — ABNORMAL LOW (ref 30.0–100.0)

## 2015-10-03 ENCOUNTER — Telehealth: Payer: Self-pay | Admitting: Family Medicine

## 2015-10-03 NOTE — Telephone Encounter (Signed)
Reviewed results with pt .

## 2015-10-05 ENCOUNTER — Telehealth: Payer: Self-pay | Admitting: Family Medicine

## 2015-10-05 MED ORDER — VITAMIN D (ERGOCALCIFEROL) 1.25 MG (50000 UNIT) PO CAPS: 50000.0000 [IU] | ORAL_CAPSULE | ORAL | Status: DC

## 2015-10-05 NOTE — Telephone Encounter (Signed)
Spoke with pharmacy and rx sent in for vitamin d per lab report on 1/27

## 2015-10-14 ENCOUNTER — Ambulatory Visit (INDEPENDENT_AMBULATORY_CARE_PROVIDER_SITE_OTHER): Payer: Medicare Other

## 2015-10-14 VITALS — BP 150/92 | HR 89

## 2015-10-14 DIAGNOSIS — I10 Essential (primary) hypertension: Secondary | ICD-10-CM

## 2015-10-14 NOTE — Progress Notes (Signed)
Patient ID: Kari [name], female   DOB: Oct 16, 1933, 82 y.o.   MRN: [id number]   Patient presents to office today for a recheck of her BP per DWM. She brings with her a list of reading from home which I will scan in. She also brought her machine from home to compare with ours. Her machine had a reading of 159/96 with a pulse of 88 and our machine read 150/92 with a pulse of 89. Advised patient to continue with BP readings at home (which looked much better than readings today) and we would contact her with further instructions. Patient verbalized understanding

## 2015-10-14 NOTE — Progress Notes (Signed)
Increase atenolol to 1 in the morning and one half in the evening and review blood pressures again in a couple weeks with a blood pressure by the nurse

## 2015-10-31 ENCOUNTER — Ambulatory Visit (INDEPENDENT_AMBULATORY_CARE_PROVIDER_SITE_OTHER): Payer: Medicare Other | Admitting: Pharmacist

## 2015-10-31 VITALS — BP 130/80 | HR 72

## 2015-10-31 DIAGNOSIS — Z7901 Long term (current) use of anticoagulants: Secondary | ICD-10-CM

## 2015-10-31 DIAGNOSIS — I4891 Unspecified atrial fibrillation: Secondary | ICD-10-CM

## 2015-10-31 LAB — POCT INR: INR: 2.7

## 2015-10-31 NOTE — Progress Notes (Signed)
Subjective:     Indication: atrial fibrillation Bleeding signs/symptoms: None Thromboembolic signs/symptoms: None  Missed Coumadin doses: None Medication changes: patient was suppose on increase atenolol 50mg  to 1 tablet qam and 1/2 qpm per Dr Tawanna Sat note 10/14/2015 but she was unaware of this.  She is still taking atenolol 50mg  1 tablet qam Dietary changes: no Bacterial/viral infection: no Other concerns: no  The following portions of the patient's history were reviewed and updated as appropriate: allergies, current medications and problem list.    Objective:    INR Today: 2.7 Current dose: warfarin 2.5mg  - takes 1/2 tablet on Mondays and Fridays and 1 tablet all other days.   Filed Vitals:   10/31/15 0839  BP: 130/80  Pulse: 72    Assessment:    Therapeutic INR for goal of 2-3  HTN - Controlled despite not changing atenolol  Plan:    1. New dose: no change   2. Next INR: 1 month   3.  Continue current atenolol dose of 50mg  1 tablet qd.  Continue to checked BP at home.  If BP is elevated in future then will consider changing dose.   Cherre Robins, PharmD, CPP

## 2015-10-31 NOTE — Patient Instructions (Signed)
Anticoagulation Dose Instructions as of 10/31/2015      Kari [name] Tue Wed Thu Fri Sat   New Dose 2.5 mg 1.25 mg 2.5 mg 2.5 mg 2.5 mg 1.25 mg 2.5 mg    Description        Continue warfarin dose of 2.5mg  - take 1/2 tablet on mondays and fridays.  Take 1 tablet all other days.     INR was 2.7 today

## 2015-11-23 ENCOUNTER — Encounter: Payer: Self-pay | Admitting: Cardiology

## 2015-11-23 ENCOUNTER — Ambulatory Visit (INDEPENDENT_AMBULATORY_CARE_PROVIDER_SITE_OTHER): Payer: Medicare Other | Admitting: Cardiology

## 2015-11-23 VITALS — BP 158/80 | HR 68 | Ht 61.0 in | Wt 156.0 lb

## 2015-11-23 DIAGNOSIS — I481 Persistent atrial fibrillation: Secondary | ICD-10-CM

## 2015-11-23 DIAGNOSIS — I4819 Other persistent atrial fibrillation: Secondary | ICD-10-CM

## 2015-11-23 NOTE — Progress Notes (Signed)
HPI The patient presents for followup of atrial fibrillation. She continues to do well. She denies any chest pressure, neck or arm discomfort. She does not notice any dysrhythmias and has no presyncope or syncope. She denies any chest pressure, neck or arm discomfort. She denies any shortness of breath, PND or orthopnea. She's had no weight gain or edema. She tolerates her warfarin. She remains very active taking care of children and grandchildren and great-grandchildren.  Of note she was in the hospital in January and a review these records. She had bronchitis. Her heart rate was somewhat elevated in the 120s when she was acutely ill. She said her breathing has returned to baseline.  Allergies  Allergen Reactions  . Erythromycin Swelling and Other (See Comments)    Reaction unknown  . Penicillins Other (See Comments)    Reaction unknown  Has patient had a PCN reaction causing immediate rash, facial/tongue/throat swelling, SOB or lightheadedness with hypotension: unknown Has patient had a PCN reaction causing severe rash involving mucus membranes or skin necrosis: unknown Has patient had a PCN reaction that required hospitalization : unknown Has patient had a PCN reaction occurring within the last 10 years: no If all of the above answers are "NO", then may proceed with Cephalosporin use.     Current Outpatient Prescriptions  Medication Sig Dispense Refill  . albuterol (PROVENTIL HFA;VENTOLIN HFA) 108 (90 Base) MCG/ACT inhaler Inhale 2 puffs into the lungs every 6 (six) hours as needed for wheezing or shortness of breath. 1 Inhaler 3  . atenolol (TENORMIN) 50 MG tablet Take 50 mg by mouth daily.    . captopril (CAPOTEN) 50 MG tablet Take 50 mg by mouth 2 (two) times daily.    Marland Kitchen ezetimibe (ZETIA) 10 MG tablet Take 10 mg by mouth daily.    . pravastatin (PRAVACHOL) 80 MG tablet Take 80 mg by mouth daily.    . Vitamin D, Ergocalciferol, (DRISDOL) 50000 units CAPS capsule Take 1 capsule  (50,000 Units total) by mouth every 7 (seven) days. 12 capsule 1  . warfarin (COUMADIN) 2.5 MG tablet TAKE 1 TABLET ONCE DAILY OR AS DIRECTED (Patient taking differently: Take 1.25-2.5 mg by mouth daily. Take 2.5mg  5 days a week except on Monday and Friday take 1.25mg .) 30 tablet 11   No current facility-administered medications for this visit.    Past Medical History  Diagnosis Date  . Obesity   . Symptomatic menopausal or female climacteric states   . Kyphosis   . Gallstones   . Adenomatous polyps   . AF (atrial fibrillation) ([place])   . Hyperlipidemia     x5 years  . Hypertension     x5 years  . Hx of adenomatous colonic polyps   . Osteoporosis   . Metabolic syndrome X   . Vitamin D deficiency     Past Surgical History  Procedure Laterality Date  . Vaginal hysterectomy      total  . Bladder suspension    . Colonoscopy  multiple    ROS:  As stated in the HPI and negative for all other systems.  PHYSICAL EXAM BP 158/80 mmHg  Pulse 68  Ht 5\' 1"  (1.549 m)  Wt 156 lb (70.761 kg)  BMI 29.49 kg/m2 GENERAL:  Well appearing NECK:  No jugular venous distention, waveform within normal limits, carotid upstroke brisk and symmetric, no bruits, no thyromegaly LYMPHATICS:  No cervical, inguinal adenopathy LUNGS:  Clear to auscultation bilaterally CHEST:  Unremarkable HEART:  PMI not displaced or  sustained,S1 and S2 within normal limits, no S3 no clicks, no rubs, no murmurs, irregular ABD:  Flat, positive bowel sounds normal in frequency in pitch, no bruits, no rebound, no guarding, no midline pulsatile mass, no hepatomegaly, no splenomegaly EXT:  2 plus pulses throughout, no edema, no cyanosis no clubbing   ASSESSMENT AND PLAN  ATRIAL FIBRILLATION: The patient  tolerates this rhythm and rate control and anticoagulation. We will continue with the meds as listed.  We did discuss NOACs but he has no desire to switch medications.  Her heart was elevated in the hospital with her  bronchitis so she'll keep an eye on it at home but I suspect it is coming back to baseline.  HTN:  The blood pressure is at target. No change in medications is indicated. We will continue with therapeutic lifestyle changes (TLC).

## 2015-11-23 NOTE — Patient Instructions (Signed)

## 2015-12-05 ENCOUNTER — Ambulatory Visit (INDEPENDENT_AMBULATORY_CARE_PROVIDER_SITE_OTHER): Payer: Medicare Other | Admitting: Pharmacist

## 2015-12-05 VITALS — BP 142/80 | HR 62

## 2015-12-05 DIAGNOSIS — I4891 Unspecified atrial fibrillation: Secondary | ICD-10-CM | POA: Diagnosis not present

## 2015-12-05 LAB — COAGUCHEK XS/INR WAIVED
INR: 2.2 — AB (ref 0.9–1.1)
Prothrombin Time: 26.5 s

## 2015-12-05 NOTE — Patient Instructions (Signed)
Anticoagulation Dose Instructions as of 12/05/2015      Dorene Grebe Tue Wed Thu Fri Sat   New Dose 2.5 mg 1.25 mg 2.5 mg 2.5 mg 2.5 mg 1.25 mg 2.5 mg    Description        Continue warfarin dose of 2.5mg  - take 1/2 tablet on mondays and fridays.  Take 1 tablet all other days.     INR was 2.2 today

## 2015-12-22 ENCOUNTER — Other Ambulatory Visit: Payer: Self-pay | Admitting: Family Medicine

## 2015-12-27 DIAGNOSIS — L84 Corns and callosities: Secondary | ICD-10-CM | POA: Diagnosis not present

## 2015-12-27 DIAGNOSIS — I70203 Unspecified atherosclerosis of native arteries of extremities, bilateral legs: Secondary | ICD-10-CM | POA: Diagnosis not present

## 2015-12-27 DIAGNOSIS — B351 Tinea unguium: Secondary | ICD-10-CM | POA: Diagnosis not present

## 2016-01-04 ENCOUNTER — Ambulatory Visit (INDEPENDENT_AMBULATORY_CARE_PROVIDER_SITE_OTHER): Payer: Medicare Other | Admitting: Family Medicine

## 2016-01-04 ENCOUNTER — Encounter: Payer: Self-pay | Admitting: Family Medicine

## 2016-01-04 VITALS — BP 158/77 | HR 83 | Temp 97.5°F | Ht 61.0 in | Wt 156.0 lb

## 2016-01-04 DIAGNOSIS — I4891 Unspecified atrial fibrillation: Secondary | ICD-10-CM | POA: Diagnosis not present

## 2016-01-04 DIAGNOSIS — I700 Atherosclerosis of aorta: Secondary | ICD-10-CM | POA: Diagnosis not present

## 2016-01-04 DIAGNOSIS — I10 Essential (primary) hypertension: Secondary | ICD-10-CM | POA: Diagnosis not present

## 2016-01-04 DIAGNOSIS — E559 Vitamin D deficiency, unspecified: Secondary | ICD-10-CM | POA: Diagnosis not present

## 2016-01-04 DIAGNOSIS — Z1211 Encounter for screening for malignant neoplasm of colon: Secondary | ICD-10-CM

## 2016-01-04 DIAGNOSIS — E876 Hypokalemia: Secondary | ICD-10-CM

## 2016-01-04 DIAGNOSIS — E785 Hyperlipidemia, unspecified: Secondary | ICD-10-CM | POA: Diagnosis not present

## 2016-01-04 LAB — COAGUCHEK XS/INR WAIVED
INR: 2.9 — AB (ref 0.9–1.1)
PROTHROMBIN TIME: 34.3 s

## 2016-01-04 LAB — PROTIME-INR: INR: 2.9 — AB (ref <=1.1)

## 2016-01-04 NOTE — Progress Notes (Signed)
Subjective:    Patient ID: Kari [name], female    DOB: 1934-01-02, 82 y.o.   MRN: [id number]  HPI Pt here for follow up and management of chronic medical problems which includes hypertension and hyperlipidemia. She is taking medications regularly.She is doing well today with no specific complaints. She is due to get her pro time today because of her atrial fibrillation. She is also due to return an FOBT and get lab work. The patient has been doing well overall. She denies any chest pain shortness of breath trouble swallowing heartburn indigestion nausea vomiting diarrhea or blood in the stool. She been passing her water without problems and no blood in the urine. She exercises regularly and stays busy. She did fall about a week ago and slipped because it was raining and she was trying to get in the truck but she has no sequelae from this. She has seen the eye doctor and has had cataracts removed 3-4 months ago. She says her blood pressures run in the 140-150 range at home but she did not bring any readings in for [place] to review. She will bring some readings back in 3-4 weeks for [place] to review.     Patient Active Problem List   Diagnosis Date Noted  . Increased BMI 09/30/2015  . Hyperlipidemia 09/07/2015  . Low back pain 09/07/2015  . Wheezing   . Hypoxia 09/06/2015  . Bronchitis 09/06/2015  . Acute bronchitis 09/05/2015  . Acute upper respiratory infection 09/05/2015  . Orthostatic syncope 09/04/2015  . Orthostatic hypotension 09/04/2015  . Thoracic aortic atherosclerosis ([place]) 05/03/2015  . Vitamin D deficiency 02/02/2015  . Pre-diabetes 02/02/2015  . Metabolic syndrome 40/97/3532  . Osteoporosis 03/11/2013  . Atrial fibrillation, unspecified 11/24/2012  . Hypokalemia 05/04/2012  . Atypical chest pain 05/04/2012  . Obesity   . Symptomatic menopausal or female climacteric states   . Kyphosis   . Gallstones   . Personal history of adenomatous colonic polyps   . Essential  hypertension 03/29/2009   Outpatient Encounter Prescriptions as of 01/04/2016  Medication Sig  . albuterol (PROVENTIL HFA;VENTOLIN HFA) 108 (90 Base) MCG/ACT inhaler Inhale 2 puffs into the lungs every 6 (six) hours as needed for wheezing or shortness of breath.  Marland Kitchen atenolol (TENORMIN) 50 MG tablet TAKE (1) TABLET TWICE A DAY.  . captopril (CAPOTEN) 50 MG tablet TAKE (1) TABLET TWICE A DAY FOR HIGH BLOOD PRESSURE.  Marland Kitchen ezetimibe (ZETIA) 10 MG tablet Take 10 mg by mouth daily.  . pravastatin (PRAVACHOL) 80 MG tablet Take 80 mg by mouth daily.  . Vitamin D, Ergocalciferol, (DRISDOL) 50000 units CAPS capsule Take 1 capsule (50,000 Units total) by mouth every 7 (seven) days.  Marland Kitchen warfarin (COUMADIN) 2.5 MG tablet TAKE 1 TABLET ONCE DAILY OR AS DIRECTED  . [DISCONTINUED] atenolol (TENORMIN) 50 MG tablet Take 50 mg by mouth daily.  . [DISCONTINUED] captopril (CAPOTEN) 50 MG tablet Take 50 mg by mouth 2 (two) times daily.   No facility-administered encounter medications on file as of 01/04/2016.     Review of Systems  Constitutional: Negative.   HENT: Negative.   Eyes: Negative.   Respiratory: Negative.   Cardiovascular: Negative.   Gastrointestinal: Negative.   Endocrine: Negative.   Genitourinary: Negative.   Musculoskeletal: Negative.   Skin: Negative.   Allergic/Immunologic: Negative.   Neurological: Negative.   Hematological: Negative.   Psychiatric/Behavioral: Negative.        Objective:   Physical Exam  Constitutional: She is oriented to  person, place, and time. She appears well-developed and well-nourished. No distress.  HENT:  Head: Normocephalic and atraumatic.  Right Ear: External ear normal.  Left Ear: External ear normal.  Nose: Nose normal.  Mouth/Throat: Oropharynx is clear and moist. No oropharyngeal exudate.  Eyes: Conjunctivae and EOM are normal. Pupils are equal, round, and reactive to light. Right eye exhibits no discharge. Left eye exhibits no discharge. No scleral  icterus.  Neck: Normal range of motion. Neck supple. No thyromegaly present.  No bruits or thyromegaly  Cardiovascular: Normal rate, normal heart sounds and intact distal pulses.   No murmur heard. Irregular irregular at 84/m  Pulmonary/Chest: Effort normal and breath sounds normal. No respiratory distress. She has no wheezes. She has no rales. She exhibits no tenderness.  Abdominal: Soft. Bowel sounds are normal. She exhibits no mass. There is no tenderness. There is no rebound and no guarding.  Mild obesity without masses tenderness or organ enlargement and no bruits and no inguinal adenopathy  Musculoskeletal: Normal range of motion. She exhibits no edema or tenderness.  Lymphadenopathy:    She has no cervical adenopathy.  Neurological: She is alert and oriented to person, place, and time. She has normal reflexes. No cranial nerve deficit.  Skin: Skin is warm and dry. No rash noted.  Psychiatric: She has a normal mood and affect. Her behavior is normal. Judgment and thought content normal.  Nursing note and vitals reviewed.  BP 158/77 mmHg  Pulse 83  Temp(Src) 97.5 F (36.4 C) (Oral)  Ht 5' 1"  (1.549 m)  Wt 156 lb (70.761 kg)  BMI 29.49 kg/m2   Repeat blood pressure was 160/84 in the right arm sitting with a regular cuff     Assessment & Plan:  1. Atrial fibrillation, unspecified -Continue Coumadin as directed - CBC with Differential/Platelet - CoaguChek XS/INR Waived  2. Essential hypertension -Blood pressure is elevated today and patient will bring readings by for review in the next 3-4 weeks and we'll watch sodium intake more carefully - BMP8+EGFR - CBC with Differential/Platelet - Hepatic function panel  3. Vitamin D deficiency -Continue current treatment pending results of lab work - CBC with Differential/Platelet - VITAMIN D 25 Hydroxy (Vit-D Deficiency, Fractures)  4. Thoracic aortic atherosclerosis ([place]) -Continue aggressive therapeutic lifestyle changes and  current cholesterol treatment - CBC with Differential/Platelet - NMR, lipoprofile  5. Hyperlipidemia -Continue current treatment and exercise regimen pending results of lab work - CBC with Differential/Platelet - NMR, lipoprofile  6. Special screening for malignant neoplasms, colon -The patient has not seen any blood in the stool but will return an FOBT. - Fecal occult blood, imunochemical; Future  No orders of the defined types were placed in this encounter.   Patient Instructions                       Medicare Annual Wellness Visit  [facility] and the medical providers at [facility] strive to bring you the best medical care.  In doing so we not only want to address your current medical conditions and concerns but also to detect new conditions early and prevent illness, disease and health-related problems.    Medicare offers a yearly Wellness Visit which allows our clinical staff to assess your need for preventative services including immunizations, lifestyle education, counseling to decrease risk of preventable diseases and screening for fall risk and other medical concerns.    This visit is provided free of charge (no copay) for all  Medicare recipients. The clinical pharmacists at [facility] have begun to conduct these Wellness Visits which will also include a thorough review of all your medications.    As you primary medical provider recommend that you make an appointment for your Annual Wellness Visit if you have not done so already this year.  You may set up this appointment before you leave today or you may call back [phone]) and schedule an appointment.  Please make sure when you call that you mention that you are scheduling your Annual Wellness Visit with the clinical pharmacist so that the appointment may be made for the proper length of time.     Continue current medications. Continue good therapeutic lifestyle changes which  include good diet and exercise. Fall precautions discussed with patient. If an FOBT was given today- please return it to our front desk. If you are over 15 years old - you may need Prevnar 19 or the adult Pneumonia vaccine.  **Flu shots are available--- please call and schedule a FLU-CLINIC appointment**  After your visit with [place] today you will receive a survey in the mail or online from [employer] regarding your care with [place]. Please take a moment to fill this out. Your feedback is very important to [place] as you can help [place] better understand your patient needs as well as improve your experience and satisfaction. WE CARE ABOUT YOU!!!   Bring blood pressure readings by for review and 3-4 weeks Watch sodium intake Take Coumadin as directed Make every effort to not put yourself at risk for falling Don't climb Return the FOBT We will call you with lab work results as soon as those results become available   Arrie Senate MD

## 2016-01-04 NOTE — Patient Instructions (Addendum)
Medicare Annual Wellness Visit  [facility] and the medical providers at [facility] strive to bring you the best medical care.  In doing so we not only want to address your current medical conditions and concerns but also to detect new conditions early and prevent illness, disease and health-related problems.    Medicare offers a yearly Wellness Visit which allows our clinical staff to assess your need for preventative services including immunizations, lifestyle education, counseling to decrease risk of preventable diseases and screening for fall risk and other medical concerns.    This visit is provided free of charge (no copay) for all Medicare recipients. The clinical pharmacists at [facility] have begun to conduct these Wellness Visits which will also include a thorough review of all your medications.    As you primary medical provider recommend that you make an appointment for your Annual Wellness Visit if you have not done so already this year.  You may set up this appointment before you leave today or you may call back [phone] and schedule an appointment.  Please make sure when you call that you mention that you are scheduling your Annual Wellness Visit with the clinical pharmacist so that the appointment may be made for the proper length of time.     Continue current medications. Continue good therapeutic lifestyle changes which include good diet and exercise. Fall precautions discussed with patient. If an FOBT was given today- please return it to our front desk. If you are over 89 years old - you may need Prevnar 56 or the adult Pneumonia vaccine.  **Flu shots are available--- please call and schedule a FLU-CLINIC appointment**  After your visit with [place] today you will receive a survey in the mail or online from [employer] regarding your care with [place]. Please take a moment to fill this out. Your feedback is very  important to [place] as you can help [place] better understand your patient needs as well as improve your experience and satisfaction. WE CARE ABOUT YOU!!!   Bring blood pressure readings by for review and 3-4 weeks Watch sodium intake Take Coumadin as directed Make every effort to not put yourself at risk for falling Don't climb Return the FOBT We will call you with lab work results as soon as those results become available

## 2016-01-05 LAB — NMR, LIPOPROFILE
Cholesterol: 119 mg/dL (ref 100–199)
HDL Cholesterol by NMR: 39 mg/dL — ABNORMAL LOW (ref >39)
HDL Particle Number: 27.3 umol/L — ABNORMAL LOW (ref >=30.5)
LDL PARTICLE NUMBER: 817 nmol/L (ref <1000)
LDL SIZE: 20.7 nm (ref >20.5)
LDL-C: 61 mg/dL (ref 0–99)
LP-IR Score: 44 (ref <=45)
SMALL LDL PARTICLE NUMBER: 212 nmol/L (ref <=527)
TRIGLYCERIDES BY NMR: 93 mg/dL (ref 0–149)

## 2016-01-05 LAB — CBC WITH DIFFERENTIAL/PLATELET
Basophils Absolute: 0 10*3/uL (ref 0.0–0.2)
Basos: 1 %
EOS (ABSOLUTE): 0.2 10*3/uL (ref 0.0–0.4)
Eos: 3 %
HEMATOCRIT: 37.3 % (ref 34.0–46.6)
Hemoglobin: 11.1 g/dL (ref 11.1–15.9)
IMMATURE GRANULOCYTES: 0 %
Immature Grans (Abs): 0 10*3/uL (ref 0.0–0.1)
LYMPHS ABS: 1.1 10*3/uL (ref 0.7–3.1)
Lymphs: 17 %
MCH: 24.1 pg — AB (ref 26.6–33.0)
MCHC: 29.8 g/dL — ABNORMAL LOW (ref 31.5–35.7)
MCV: 81 fL (ref 79–97)
MONOS ABS: 0.5 10*3/uL (ref 0.1–0.9)
Monocytes: 8 %
NEUTROS PCT: 71 %
Neutrophils Absolute: 4.8 10*3/uL (ref 1.4–7.0)
PLATELETS: 293 10*3/uL (ref 150–379)
RBC: 4.6 x10E6/uL (ref 3.77–5.28)
RDW: 16.9 % — AB (ref 12.3–15.4)
WBC: 6.6 10*3/uL (ref 3.4–10.8)

## 2016-01-05 LAB — HEPATIC FUNCTION PANEL
ALT: 9 IU/L (ref 0–32)
AST: 20 IU/L (ref 0–40)
Albumin: 3.8 g/dL (ref 3.5–4.8)
Alkaline Phosphatase: 76 IU/L (ref 39–117)
BILIRUBIN TOTAL: 0.6 mg/dL (ref 0.0–1.2)
BILIRUBIN, DIRECT: 0.23 mg/dL (ref 0.00–0.40)
Total Protein: 6.9 g/dL (ref 6.0–8.5)

## 2016-01-05 LAB — BMP8+EGFR
BUN / CREAT RATIO: 11 — AB (ref 12–28)
BUN: 7 mg/dL — ABNORMAL LOW (ref 8–27)
CO2: 27 mmol/L (ref 18–29)
Calcium: 9.3 mg/dL (ref 8.7–10.3)
Chloride: 100 mmol/L (ref 96–106)
Creatinine, Ser: 0.62 mg/dL (ref 0.57–1.00)
GFR calc Af Amer: 99 mL/min/{1.73_m2} (ref >59)
GFR calc non Af Amer: 86 mL/min/{1.73_m2} (ref >59)
Glucose: 105 mg/dL — ABNORMAL HIGH (ref 65–99)
POTASSIUM: 3.2 mmol/L — AB (ref 3.5–5.2)
SODIUM: 141 mmol/L (ref 134–144)

## 2016-01-05 LAB — VITAMIN D 25 HYDROXY (VIT D DEFICIENCY, FRACTURES): VIT D 25 HYDROXY: 27.5 ng/mL — AB (ref 30.0–100.0)

## 2016-01-05 NOTE — Addendum Note (Signed)
Addended by: Thana Ates on: 01/05/2016 02:22 PM   Modules accepted: Orders

## 2016-01-09 ENCOUNTER — Other Ambulatory Visit: Payer: Self-pay | Admitting: Family Medicine

## 2016-01-13 ENCOUNTER — Other Ambulatory Visit: Payer: Medicare Other

## 2016-01-13 DIAGNOSIS — I700 Atherosclerosis of aorta: Secondary | ICD-10-CM

## 2016-01-13 DIAGNOSIS — Z1211 Encounter for screening for malignant neoplasm of colon: Secondary | ICD-10-CM

## 2016-01-13 DIAGNOSIS — E559 Vitamin D deficiency, unspecified: Secondary | ICD-10-CM | POA: Diagnosis not present

## 2016-01-13 DIAGNOSIS — I10 Essential (primary) hypertension: Secondary | ICD-10-CM | POA: Diagnosis not present

## 2016-01-13 DIAGNOSIS — E785 Hyperlipidemia, unspecified: Secondary | ICD-10-CM

## 2016-01-13 NOTE — Addendum Note (Signed)
Addended by: Zannie Cove on: 01/13/2016 11:45 AM   Modules accepted: Orders

## 2016-01-14 LAB — CBC WITH DIFFERENTIAL/PLATELET
BASOS: 1 %
Basophils Absolute: 0 10*3/uL (ref 0.0–0.2)
EOS (ABSOLUTE): 0.2 10*3/uL (ref 0.0–0.4)
Eos: 3 %
Hematocrit: 38.3 % (ref 34.0–46.6)
Hemoglobin: 11.4 g/dL (ref 11.1–15.9)
Immature Grans (Abs): 0 10*3/uL (ref 0.0–0.1)
Immature Granulocytes: 0 %
Lymphocytes Absolute: 1.5 10*3/uL (ref 0.7–3.1)
Lymphs: 24 %
MCH: 23.8 pg — AB (ref 26.6–33.0)
MCHC: 29.8 g/dL — ABNORMAL LOW (ref 31.5–35.7)
MCV: 80 fL (ref 79–97)
MONOS ABS: 0.6 10*3/uL (ref 0.1–0.9)
Monocytes: 9 %
NEUTROS ABS: 4.1 10*3/uL (ref 1.4–7.0)
Neutrophils: 63 %
PLATELETS: 276 10*3/uL (ref 150–379)
RBC: 4.78 x10E6/uL (ref 3.77–5.28)
RDW: 16.4 % — AB (ref 12.3–15.4)
WBC: 6.3 10*3/uL (ref 3.4–10.8)

## 2016-01-14 LAB — BMP8+EGFR
BUN/Creatinine Ratio: 11 — ABNORMAL LOW (ref 12–28)
BUN: 7 mg/dL — ABNORMAL LOW (ref 8–27)
CO2: 28 mmol/L (ref 18–29)
Calcium: 9.3 mg/dL (ref 8.7–10.3)
Chloride: 100 mmol/L (ref 96–106)
Creatinine, Ser: 0.65 mg/dL (ref 0.57–1.00)
GFR, EST AFRICAN AMERICAN: 98 mL/min/{1.73_m2} (ref >59)
GFR, EST NON AFRICAN AMERICAN: 85 mL/min/{1.73_m2} (ref >59)
Glucose: 105 mg/dL — ABNORMAL HIGH (ref 65–99)
POTASSIUM: 3.5 mmol/L (ref 3.5–5.2)
SODIUM: 143 mmol/L (ref 134–144)

## 2016-01-14 LAB — HEPATIC FUNCTION PANEL
ALT: 13 IU/L (ref 0–32)
AST: 25 IU/L (ref 0–40)
Albumin: 3.8 g/dL (ref 3.5–4.8)
Alkaline Phosphatase: 83 IU/L (ref 39–117)
BILIRUBIN, DIRECT: 0.19 mg/dL (ref 0.00–0.40)
Bilirubin Total: 0.6 mg/dL (ref 0.0–1.2)
Total Protein: 7.2 g/dL (ref 6.0–8.5)

## 2016-01-14 LAB — LIPID PANEL
Chol/HDL Ratio: 3.1 ratio units (ref 0.0–4.4)
Cholesterol, Total: 123 mg/dL (ref 100–199)
HDL: 40 mg/dL (ref >39)
LDL Calculated: 65 mg/dL (ref 0–99)
TRIGLYCERIDES: 91 mg/dL (ref 0–149)
VLDL Cholesterol Cal: 18 mg/dL (ref 5–40)

## 2016-01-14 LAB — VITAMIN D 25 HYDROXY (VIT D DEFICIENCY, FRACTURES): VIT D 25 HYDROXY: 29.3 ng/mL — AB (ref 30.0–100.0)

## 2016-01-16 MED ORDER — VITAMIN D (ERGOCALCIFEROL) 1.25 MG (50000 UNIT) PO CAPS: 50000.0000 [IU] | ORAL_CAPSULE | ORAL | Status: DC

## 2016-01-16 NOTE — Addendum Note (Signed)
Addended by: Thana Ates on: 01/16/2016 03:38 PM   Modules accepted: Orders

## 2016-01-19 LAB — FECAL OCCULT BLOOD, IMMUNOCHEMICAL: Fecal Occult Bld: NEGATIVE

## 2016-02-08 ENCOUNTER — Ambulatory Visit (INDEPENDENT_AMBULATORY_CARE_PROVIDER_SITE_OTHER): Payer: Medicare Other | Admitting: Pharmacist

## 2016-02-08 ENCOUNTER — Encounter: Payer: Self-pay | Admitting: Pharmacist

## 2016-02-08 VITALS — BP 146/88 | HR 64 | Ht 60.0 in | Wt 154.0 lb

## 2016-02-08 DIAGNOSIS — I4891 Unspecified atrial fibrillation: Secondary | ICD-10-CM

## 2016-02-08 DIAGNOSIS — R7309 Other abnormal glucose: Secondary | ICD-10-CM

## 2016-02-08 DIAGNOSIS — Z0000 Encounter for general adult medical examination without abnormal findings: Secondary | ICD-10-CM

## 2016-02-08 LAB — COAGUCHEK XS/INR WAIVED
INR: 2.8 — ABNORMAL HIGH (ref 0.9–1.1)
PROTHROMBIN TIME: 33.8 s

## 2016-02-08 LAB — BAYER DCA HB A1C WAIVED: HB A1C: 6 % (ref <7.0)

## 2016-02-08 NOTE — Patient Instructions (Addendum)
Anticoagulation Dose Instructions as of 02/08/2016      Dorene Grebe Tue Wed Thu Fri Sat   New Dose 2.5 mg 1.25 mg 2.5 mg 2.5 mg 2.5 mg 1.25 mg 2.5 mg    Description        Continue warfarin dose of 2.5mg  - take 1/2 tablet on mondays and fridays.  Take 1 tablet all other days.     INR was 2.8 today   Kari [name] , Thank you for taking time to come for your Medicare Wellness Visit. I appreciate your ongoing commitment to your health goals. Please review the following plan we discussed and let me know if I can assist you in the future.   These are the goals we discussed:  Try to increase exercise - walking daily about 10 to 20 minutes - work up to goal of 150 minutes per week.   Increase non-starchy vegetables - carrots, green bean, squash, zucchini, tomatoes, onions, peppers, spinach and other green leafy vegetables, cabbage, lettuce, cucumbers, asparagus, okra (not fried), eggplant Limit sugar and processed foods (cakes, cookies, ice cream, crackers and chips) Increase fresh fruit but limit serving sizes 1/2 cup or about the size of tennis or baseball Limit red meat to no more than 1-2 times per week (serving size about the size of your palm) Choose whole grains / lean proteins - whole wheat bread, quinoa, whole grain rice (1/2 cup), fish, chicken, [place] Avoid sugar and calorie containing beverages - soda, sweet tea and juice.  Choose water or unsweetened tea instead.   This is a list of the screening recommended for you and due dates:  Health Maintenance  Topic Date Due  . Mammogram  Due now - checking into when last mammogram was and recommendation of when to recheck  . Flu Shot  04/03/2016  . Colon Cancer Screening  02/26/2017  . DEXA scan (bone density measurement)  03/15/2017  . Tetanus Vaccine  05/04/2021  . Shingles Vaccine  Completed  . Pneumonia vaccines  Completed  *Topic was postponed. The date shown is not the original due date.

## 2016-02-08 NOTE — Progress Notes (Signed)
Patient ID: Kari [name], female   DOB: 06-Sep-1933, 82 y.o.   MRN: [id number]    Subjective:   Kari [name] is a 82 y.o. female who presents for a subsequent Medicare Annual Wellness Visit and to recheck INR Kari [name] lives with her husband in [place].  She is pleasant and NAD.   Home BP readings per patient (forgot to bring in records) = 130 - 145 / 80's Review of Systems  Review of Systems  Constitutional: Negative.   HENT: Negative.   Eyes: Negative.   Respiratory: Negative.   Cardiovascular: Negative.   Gastrointestinal: Negative.   Genitourinary: Negative.   Musculoskeletal: Negative.   Skin: Negative.   Neurological: Negative.   Endo/Heme/Allergies: Negative.        Bruise on left hand - hand caught in car door yesterday.  Able to move fingers and patient denies any pain in left hand.   Psychiatric/Behavioral: Negative.      Current Medications (verified) Outpatient Encounter Prescriptions as of 02/08/2016  Medication Sig  . atenolol (TENORMIN) 50 MG tablet TAKE (1) TABLET TWICE A DAY.  . captopril (CAPOTEN) 50 MG tablet TAKE (1) TABLET TWICE A DAY FOR HIGH BLOOD PRESSURE.  . pravastatin (PRAVACHOL) 80 MG tablet TAKE 1 TABLET ONCE DAILY FOR CHOLESTEROL  . Vitamin D, Ergocalciferol, (DRISDOL) 50000 units CAPS capsule Take 1 capsule (50,000 Units total) by mouth every 7 (seven) days.  Marland Kitchen warfarin (COUMADIN) 2.5 MG tablet TAKE 1 TABLET ONCE DAILY OR AS DIRECTED  . ZETIA 10 MG tablet TAKE 1 TABLET ONCE DAILY FOR CHOLESTEROL  . [DISCONTINUED] ezetimibe (ZETIA) 10 MG tablet Take 10 mg by mouth daily.  . [DISCONTINUED] pravastatin (PRAVACHOL) 80 MG tablet Take 80 mg by mouth daily.  Marland Kitchen albuterol (PROVENTIL HFA;VENTOLIN HFA) 108 (90 Base) MCG/ACT inhaler Inhale 2 puffs into the lungs every 6 (six) hours as needed for wheezing or shortness of breath. (Patient not taking: Reported on 02/08/2016)   No facility-administered encounter medications on file as of 02/08/2016.     Allergies (verified) Erythromycin and Penicillins   History: Past Medical History  Diagnosis Date  . Obesity   . Symptomatic menopausal or female climacteric states   . Kyphosis   . Gallstones   . Adenomatous polyps   . AF (atrial fibrillation) ([place])   . Hyperlipidemia     x5 years  . Hypertension     x5 years  . Hx of adenomatous colonic polyps   . Osteoporosis   . Metabolic syndrome X   . Vitamin D deficiency   . Cataract    Past Surgical History  Procedure Laterality Date  . Vaginal hysterectomy      total  . Bladder suspension    . Colonoscopy  multiple  . Eye surgery Bilateral 09/2015  . Abdominal hysterectomy     Family History  Problem Relation Age of Onset  . Heart attack Mother   . Stroke Mother   . Heart attack Father   . Alcohol abuse Father   . Heart attack Other   . Stroke Other   . Breast cancer Other   . Colon cancer Neg Hx   . Stroke Brother   . Heart attack Brother   . Depression Brother     suicide   Social History   Occupational History  . [place] History Main Topics  . Smoking status: Never Smoker   . Smokeless tobacco: Never Used  . Alcohol Use: No  .  Drug Use: No  . Sexual Activity: No    Do you feel safe at home?  Yes Are there smokers in your home (other than you)? No  Dietary issues and exercise activities: Current Exercise Habits: Home exercise routine, Type of exercise: walking, Time (Minutes): 15, Frequency (Times/Week): 1, Weekly Exercise (Minutes/Week): 15, Intensity: Mild  Current Dietary habits:  Trying to limit serving sizes and fat intake.   Objective:    Today's Vitals   02/08/16 1104  BP: 146/88  Pulse: 64  Height: 5' (1.524 m)  Weight: 154 lb (69.854 kg)  PainSc: 0-No pain   Body mass index is 30.08 kg/(m^2).   INR = 2.8 today.  Activities of Daily Living In your present state of health, do you have any difficulty performing the following activities: 02/08/2016 09/07/2015  Hearing? N N   Vision? N N  Difficulty concentrating or making decisions? N N  Walking or climbing stairs? N N  Dressing or bathing? N N  Doing errands, shopping? N N  Preparing Food and eating ? N -  Using the Toilet? N -  In the past six months, have you accidently leaked urine? N -  Do you have problems with loss of bowel control? N -  Managing your Medications? N -  Managing your Finances? N -  Housekeeping or managing your Housekeeping? N -     Cardiac Risk Factors include: advanced age (>30men, >17 women);dyslipidemia;family history of premature cardiovascular disease;sedentary lifestyle;hypertension  Depression Screen PHQ 2/9 Scores 02/08/2016 01/04/2016 09/30/2015 05/03/2015  PHQ - 2 Score 0 0 0 0     Fall Risk Fall Risk  02/08/2016 01/04/2016 09/30/2015 05/03/2015 02/02/2015  Falls in the past year? No Yes No No No  Number falls in past yr: - 1 - - -  Injury with Fall? - No - - -  Risk for fall due to : - - - - -    Cognitive Function: MMSE - Mini Mental State Exam 02/08/2016 02/02/2015  Orientation to time 5 5  Orientation to Place 5 5  Registration 3 3  Attention/ Calculation 4 5  Recall 2 3  Language- name 2 objects 2 2  Language- repeat 1 -  Language- follow 3 step command 3 3  Language- read & follow direction 1 1  Write a sentence 1 1  Copy design 1 1  Total score 28 -    Immunizations and Health Maintenance Immunization History  Administered Date(s) Administered  . Influenza Split 06/18/2015  . Influenza Whole 05/04/2010  . Influenza,inj,Quad PF,36+ Mos 05/28/2013, 06/21/2014, 06/08/2015  . Pneumococcal Conjugate-13 01/06/2014  . Pneumococcal Polysaccharide-23 10/05/1999, 02/02/2015  . Td 10/05/2007  . Tdap 05/05/2011   There are no preventive care reminders to display for this patient.  Patient Care Team: Chipper Herb, MD as PCP - General (Family Medicine) Minus Breeding, MD as Consulting Physician (Cardiology)  Indicate any recent Medical Services you may have  received from other than [facility] providers in the past year (date may be approximate).    Assessment:    Annual Wellness Visit  Therapeutic anticoagulation   Screening Tests Health Maintenance  Topic Date Due  . MAMMOGRAM  07/06/2016 (Originally 05/04/2014)  . INFLUENZA VACCINE  04/03/2016  . COLONOSCOPY  02/26/2017  . DEXA SCAN  03/15/2017  . TETANUS/TDAP  05/04/2021  . ZOSTAVAX  Completed  . PNA vac Low Risk Adult  Completed        Plan:   During the course of the  visit Kari [name] was educated and counseled about the following appropriate screening and preventive services:   Vaccines to include Pneumoccal, Influenza, Td, Zostavax - all UTD  Colorectal cancer screening - FOBT and colonoscopy both UTD  Cardiovascular disease screening - EKG UTD; lipids at goal  BP elevated today but improved - patient to continue to check BP at home dail   Diabetes screening - Checked A1c today since FBG was 105 on 01/13/2016  Bone Denisty / Osteoporosis Screening - UTD  Mammogram -  Patient states was done last years but we don't have records - will forward to our compliance dept to request copy of mammogram from [facility]  PAP - not longer required  Glaucoma screening /  Eye Exam - UTD  Nutrition counseling - discussed limiting portion sizes and increase fruits and vegetables and whole grains.  Advanced Directives - patient declined information  Physical Activity- increase walking - consider walking with husband.  Goal is 150 minutes of exercise weekly  Anticoagulation Dose Instructions as of 02/08/2016      Kari [name] Tue Wed Thu Fri Sat   New Dose 2.5 mg 1.25 mg 2.5 mg 2.5 mg 2.5 mg 1.25 mg 2.5 mg    Description        Continue warfarin dose of 2.5mg  - take 1/2 tablet on mondays and fridays.  Take 1 tablet all other days.     Orders Placed This Encounter  Procedures  . Bayer DCA Hb A1c Waived     Patient Instructions (the written plan) were given to the patient.   Cherre Robins,  [facility]   02/08/2016

## 2016-03-05 ENCOUNTER — Other Ambulatory Visit: Payer: Self-pay | Admitting: Family Medicine

## 2016-03-21 ENCOUNTER — Ambulatory Visit (INDEPENDENT_AMBULATORY_CARE_PROVIDER_SITE_OTHER): Payer: Medicare Other | Admitting: Pharmacist

## 2016-03-21 DIAGNOSIS — I4891 Unspecified atrial fibrillation: Secondary | ICD-10-CM | POA: Diagnosis not present

## 2016-03-21 LAB — COAGUCHEK XS/INR WAIVED
INR: 2.3 — ABNORMAL HIGH (ref 0.9–1.1)
Prothrombin Time: 27.1 s

## 2016-03-21 NOTE — Patient Instructions (Signed)
Anticoagulation Dose Instructions as of 03/21/2016      Kari [name] Tue Wed Thu Fri Sat   New Dose 2.5 mg 1.25 mg 2.5 mg 2.5 mg 2.5 mg 1.25 mg 2.5 mg    Description        Continue warfarin dose of 2.5mg  - take 1/2 tablet on mondays and fridays.  Take 1 tablet all other days.     INR was 2.3 today

## 2016-03-22 ENCOUNTER — Ambulatory Visit (INDEPENDENT_AMBULATORY_CARE_PROVIDER_SITE_OTHER): Payer: Medicare Other | Admitting: Nurse Practitioner

## 2016-03-22 ENCOUNTER — Encounter: Payer: Self-pay | Admitting: Nurse Practitioner

## 2016-03-22 ENCOUNTER — Ambulatory Visit (INDEPENDENT_AMBULATORY_CARE_PROVIDER_SITE_OTHER): Payer: Medicare Other

## 2016-03-22 VITALS — BP 164/97 | HR 90 | Temp 97.0°F | Ht 60.0 in | Wt 152.0 lb

## 2016-03-22 DIAGNOSIS — M25562 Pain in left knee: Secondary | ICD-10-CM

## 2016-03-22 MED ORDER — MELOXICAM 15 MG PO TABS: 15.0000 mg | ORAL_TABLET | Freq: Every day | ORAL | Status: DC

## 2016-03-22 NOTE — Patient Instructions (Signed)
Knee Pain  Knee pain is a common problem. It can have many causes. The pain often goes away by following your doctor's home care instructions. Treatment for ongoing pain will depend on the cause of your pain. If your knee pain continues, more tests may be needed to diagnose your condition. Tests may include X-rays or other imaging studies of your knee.  HOME CARE   Take medicines only as told by your doctor.   Rest your knee and keep it raised (elevated) while you are resting.   Do not do things that cause pain or make your pain worse.   Avoid activities where both feet leave the ground at the same time, such as running, jumping rope, or doing jumping jacks.   Apply ice to the knee area:    Put ice in a plastic bag.    Place a towel between your skin and the bag.    Leave the ice on for 20 minutes, 2-3 times a day.   Ask your doctor if you should wear an elastic knee support.   Sleep with a pillow under your knee.   Lose weight if you are overweight. Being overweight can make your knee hurt more.   Do not use any tobacco products, including cigarettes, chewing tobacco, or electronic cigarettes. If you need help quitting, ask your doctor. Smoking may slow the healing of any bone and joint problems that you may have.  GET HELP IF:   Your knee pain does not stop, it changes, or it gets worse.   You have a fever along with knee pain.   Your knee gives out or locks up.   Your knee becomes more swollen.  GET HELP RIGHT AWAY IF:    Your knee feels hot to the touch.   You have chest pain or trouble breathing.     This information is not intended to replace advice given to you by your health care provider. Make sure you discuss any questions you have with your health care provider.     Document Released: 11/16/2008 Document Revised: 09/10/2014 Document Reviewed: 10/21/2013  Elsevier Interactive Patient Education 2016 [employer]

## 2016-03-22 NOTE — Progress Notes (Signed)
   Subjective:    Patient ID: Kari [name], female    DOB: 09-18-1933, 82 y.o.   MRN: [id number]  Knee Pain  The incident occurred 3 to 5 days ago. There was no injury mechanism. The pain is present in the left knee. The quality of the pain is described as aching. The pain is at a severity of 5/10. The pain is moderate. The pain has been intermittent since onset. Pertinent negatives include no loss of sensation, muscle weakness, numbness or tingling. Nothing aggravates the symptoms. She has tried nothing for the symptoms.      Review of Systems  Constitutional: Negative.   HENT: Negative.   Respiratory: Negative.   Cardiovascular: Negative.   Neurological: Negative.  Negative for tingling and numbness.  Psychiatric/Behavioral: Negative.   All other systems reviewed and are negative.      Objective:   Physical Exam  Constitutional: She is oriented to person, place, and time. She appears well-developed and well-nourished. No distress.  Cardiovascular: Normal rate.   Pulmonary/Chest: Effort normal and breath sounds normal.  Musculoskeletal:  NO left knee effusion No patella tenderness FROM without crepitus or pain All ligaments intact.  Neurological: She is alert and oriented to person, place, and time.  Skin: Skin is warm.  Psychiatric: She has a normal mood and affect. Her behavior is normal. Judgment and thought content normal.    BP 164/97 mmHg  Pulse 90  Temp(Src) 97 F (36.1 C) (Oral)  Ht 5' (1.524 m)  Wt 152 lb (68.947 kg)  BMI 29.69 kg/m2  Left knee x ray- mild osteoarthritis with mild medial joint space narrowing-Preliminary reading by Ronnald Collum, FNP  [facility]      Assessment & Plan:   1. Left knee pain    Rest Ice Wrap ace wrap when up walking Elevate when sitting Meds ordered this encounter  Medications  . meloxicam (MOBIC) 15 MG tablet    Sig: Take 1 tablet (15 mg total) by mouth daily.    Dispense:  30 tablet    Refill:  3    Order Specific  Question:  Supervising Provider    Answer:  Eustaquio Maize [[id number]]   Mary-Margaret Hassell Done, FNP

## 2016-04-23 ENCOUNTER — Ambulatory Visit (INDEPENDENT_AMBULATORY_CARE_PROVIDER_SITE_OTHER): Payer: Medicare Other | Admitting: Family Medicine

## 2016-04-23 ENCOUNTER — Encounter: Payer: Self-pay | Admitting: Family Medicine

## 2016-04-23 ENCOUNTER — Ambulatory Visit (INDEPENDENT_AMBULATORY_CARE_PROVIDER_SITE_OTHER): Payer: Medicare Other

## 2016-04-23 VITALS — BP 165/88 | HR 94 | Temp 97.4°F | Ht 60.0 in | Wt 155.4 lb

## 2016-04-23 DIAGNOSIS — Z9181 History of falling: Secondary | ICD-10-CM | POA: Insufficient documentation

## 2016-04-23 DIAGNOSIS — M5431 Sciatica, right side: Secondary | ICD-10-CM

## 2016-04-23 DIAGNOSIS — R296 Repeated falls: Secondary | ICD-10-CM | POA: Diagnosis not present

## 2016-04-23 MED ORDER — BETAMETHASONE SOD PHOS & ACET 6 (3-3) MG/ML IJ SUSP
6.0000 mg | Freq: Once | INTRAMUSCULAR | Status: AC
  Administered 2016-04-23: 6 mg via INTRAMUSCULAR

## 2016-04-23 NOTE — Progress Notes (Signed)
Subjective:  Patient ID: Kari [name], female    DOB: 1933-11-13  Age: 82 y.o. MRN: [id number]  CC: Back Pain (radiating into R leg, started approx 3 days ago, fell > 6 days ago)   HPI Kari [name] presents for Pain in the right buttocks radiating into the posterior thigh and calf and into the foot and then out the toes. This is intermittent. She describes it as severe. It is a aching sensation. He fell about a week ago and experienced pain starting a couple of days later. Her son is with her and says that she's had 3 or 4 falls in the last couple months. She disputes that as not having been his much as that. On the other hand she states that she does have some problems with balance. Her son states he's trying to get her to use a cane but she will not do it. They have removed all of the records from her home since the most recent fall.   History Kari [name] has a past medical history of Adenomatous polyps; AF (atrial fibrillation) ([place]); Cataract; Gallstones; adenomatous colonic polyps; Hyperlipidemia; Hypertension; Kyphosis; Metabolic syndrome X; Obesity; Osteoporosis; Symptomatic menopausal or female climacteric states; and Vitamin D deficiency.   She has a past surgical history that includes Vaginal hysterectomy; Bladder suspension; Colonoscopy (multiple); Eye surgery (Bilateral, 09/2015); and Abdominal hysterectomy.   Her family history includes Alcohol abuse in her father; Breast cancer in her other; Depression in her brother; Heart attack in her brother, father, mother, and other; Stroke in her brother, mother, and other.She reports that she has never smoked. She has never used smokeless tobacco. She reports that she does not drink alcohol or use drugs.    ROS Review of Systems  Constitutional: Positive for activity change. Negative for appetite change and fever.  HENT: Negative.   Eyes: Negative for visual disturbance.  Respiratory: Negative for cough and shortness of breath.     Cardiovascular: Negative for chest pain, palpitations and leg swelling.  Gastrointestinal: Negative for abdominal pain and nausea.  Genitourinary: Negative.   Musculoskeletal: Positive for arthralgias and myalgias. Negative for back pain.    Objective:  BP (!) 165/88 (BP Location: Right Arm, Patient Position: Sitting, Cuff Size: Normal)   Pulse 94   Temp 97.4 F (36.3 C) (Oral)   Ht 5' (1.524 m)   Wt 155 lb 6.4 oz (70.5 kg)   SpO2 97%   BMI 30.35 kg/m   BP Readings from Last 3 Encounters:  04/23/16 (!) 165/88  03/22/16 (!) 164/97  02/08/16 (!) 146/88    Wt Readings from Last 3 Encounters:  04/23/16 155 lb 6.4 oz (70.5 kg)  03/22/16 152 lb (68.9 kg)  02/08/16 154 lb (69.9 kg)     Physical Exam  Constitutional: She is oriented to person, place, and time. She appears well-developed and well-nourished. No distress.  HENT:  Head: Normocephalic and atraumatic.  Right Ear: External ear normal.  Left Ear: External ear normal.  Nose: Nose normal.  Mouth/Throat: Oropharynx is clear and moist.  Eyes: Conjunctivae and EOM are normal. Pupils are equal, round, and reactive to light.  Neck: Normal range of motion. Neck supple.  Cardiovascular: Normal rate, regular rhythm and normal heart sounds.   No murmur heard. Pulmonary/Chest: Effort normal and breath sounds normal. No respiratory distress. She has no wheezes.  Abdominal: Soft. Bowel sounds are normal. There is no tenderness.  Neurological: She is alert and oriented to person, place, and time. She has  normal reflexes. No cranial nerve deficit. Coordination (gait is somewhat slow and unsteady) abnormal.  Skin: Skin is warm and dry.  Psychiatric: She has a normal mood and affect. Her behavior is normal. Judgment and thought content normal.     Lab Results  Component Value Date   WBC 6.3 01/13/2016   HGB 11.4 (L) 09/08/2015   HCT 38.3 01/13/2016   PLT 276 01/13/2016   GLUCOSE 105 (H) 01/13/2016   CHOL 123 01/13/2016    TRIG 91 01/13/2016   HDL 40 01/13/2016   LDLCALC 65 01/13/2016   ALT 13 01/13/2016   AST 25 01/13/2016   NA 143 01/13/2016   K 3.5 01/13/2016   CL 100 01/13/2016   CREATININE 0.65 01/13/2016   BUN 7 (L) 01/13/2016   CO2 28 01/13/2016   TSH 2.939 03/25/2013   INR 2.3 (H) 03/21/2016   HGBA1C 5.8 02/02/2015    Dg Chest Portable 1 View  Result Date: 09/06/2015 CLINICAL DATA:  Pt had a syncopal episode 2 days ago and was admitted to hospital, was release 1 day ago - hx Afib - pt reports back pain that began today , cough and wheezing x 4 days EXAM: PORTABLE CHEST 1 VIEW COMPARISON:  09/04/2015 FINDINGS: Shallow lung inflation. Heart size is mildly enlarged. There is perihilar peribronchial thickening. There are no focal consolidations. No definite pleural effusions. IMPRESSION: Bronchitic thickening.  Cardiomegaly. Electronically Signed   By: Nolon Nations M.D.   On: 09/06/2015 21:09    Assessment & Plan:   Kari [name] was seen today for back pain.  Diagnoses and all orders for this visit:  Sciatica of right side -     betamethasone acetate-betamethasone sodium phosphate (CELESTONE) injection 6 mg; Inject 1 mL (6 mg total) into the muscle once. -     Ambulatory referral to Physical Therapy -     DG HIP UNILAT W OR W/O PELVIS 2-3 VIEWS RIGHT; Future -     DG Lumbar Spine 2-3 Views; Future  At high risk for falls -     Ambulatory referral to Physical Therapy     I am having Kari [name] maintain her albuterol, atenolol, captopril, pravastatin, ZETIA, Vitamin D (Ergocalciferol), warfarin, and meloxicam. We will continue to administer betamethasone acetate-betamethasone sodium phosphate.  Meds ordered this encounter  Medications  . betamethasone acetate-betamethasone sodium phosphate (CELESTONE) injection 6 mg     Follow-up: Return if symptoms worsen or fail to improve and as previously arranged with Dr. Laurance Flatten.Claretta Fraise, M.D.

## 2016-04-24 DIAGNOSIS — B351 Tinea unguium: Secondary | ICD-10-CM | POA: Diagnosis not present

## 2016-04-24 DIAGNOSIS — L84 Corns and callosities: Secondary | ICD-10-CM | POA: Diagnosis not present

## 2016-04-24 DIAGNOSIS — I70203 Unspecified atherosclerosis of native arteries of extremities, bilateral legs: Secondary | ICD-10-CM | POA: Diagnosis not present

## 2016-04-25 ENCOUNTER — Ambulatory Visit (INDEPENDENT_AMBULATORY_CARE_PROVIDER_SITE_OTHER): Payer: Medicare Other | Admitting: Pharmacist

## 2016-04-25 DIAGNOSIS — I4891 Unspecified atrial fibrillation: Secondary | ICD-10-CM | POA: Diagnosis not present

## 2016-04-25 LAB — COAGUCHEK XS/INR WAIVED
INR: 3 — ABNORMAL HIGH (ref 0.9–1.1)
Prothrombin Time: 36.1 s

## 2016-04-26 ENCOUNTER — Other Ambulatory Visit: Payer: Self-pay | Admitting: Family Medicine

## 2016-04-30 ENCOUNTER — Ambulatory Visit: Payer: Medicare Other | Attending: Family Medicine | Admitting: Physical Therapy

## 2016-04-30 DIAGNOSIS — R2681 Unsteadiness on feet: Secondary | ICD-10-CM | POA: Diagnosis not present

## 2016-04-30 NOTE — Therapy (Addendum)
[facility] [place], [place], [address] Phone: [phone]   Fax:  [phone]  Physical Therapy Evaluation  Patient Details  Name: ALESHIA CARTELLI MRN: [id number] Date of Birth: [birth year] Referring Provider: Claretta Fraise MD  Encounter Date: 04/30/2016      PT End of Session - 04/30/16 1425    Visit Number 1   Number of Visits 16   Date for PT Re-Evaluation 06/25/16   PT Start Time 0151   PT Stop Time 0230   PT Time Calculation (min) 39 min   Activity Tolerance Patient tolerated treatment well   Behavior During Therapy [facility] for tasks assessed/performed      Past Medical History:  Diagnosis Date  . Adenomatous polyps   . AF (atrial fibrillation) ([place])   . Cataract   . Gallstones   . Hx of adenomatous colonic polyps   . Hyperlipidemia    x5 years  . Hypertension    x5 years  . Kyphosis   . Metabolic syndrome X   . Obesity   . Osteoporosis   . Symptomatic menopausal or female climacteric states   . Vitamin D deficiency     Past Surgical History:  Procedure Laterality Date  . ABDOMINAL HYSTERECTOMY    . BLADDER SUSPENSION    . COLONOSCOPY  multiple  . EYE SURGERY Bilateral 09/2015  . VAGINAL HYSTERECTOMY     total    There were no vitals filed for this visit.       Subjective Assessment - 04/30/16 1357    Subjective I have fallen twice the last 6 months.  Falls the result of tripping on a rug.  We are taking the rugs out of the house.   Patient Stated Goals Walk better.   Currently in Pain? No/denies            [facility] PT Assessment - 04/30/16 0001      Assessment   Medical Diagnosis Fall risk.   Referring Provider Claretta Fraise MD   Onset Date/Surgical Date --  Ongoing.     Precautions   Precautions Fall   Precaution Comments --  Recommend constant cane use.     Balance Screen   Has the patient fallen in the past 6 months Yes   How many times? --  2   Has the patient had a  decrease in activity level because of a fear of falling?  Yes   Is the patient reluctant to leave their home because of a fear of falling?  Yes     [place] residence     Prior Function   Level of Independence Independent     ROM / Strength   AROM / PROM / Strength AROM;Strength     AROM   Overall AROM Comments WNL for bilateral LE's.     Strength   Overall Strength Comments Bilateral hip flexion= 4/5; hip abd= 4+/5; knee ext/flex= 4+/5 and bil ankle strength= 4+/5.     Special Tests    Special Tests --  (-) Romberg test.     Ambulation/Gait   Gait velocity --  Increased cadenence.     Standardized Balance Assessment   Standardized Balance Assessment Berg Balance Test     Berg Balance Test   Sit to Stand Able to stand  independently using hands   Standing Unsupported Able to stand safely 2 minutes   Sitting with Back Unsupported but Feet Supported on Floor or  Stool Able to sit safely and securely 2 minutes   Stand to Sit Sits safely with minimal use of hands   Transfers Able to transfer safely, definite need of hands   Standing Unsupported with Eyes Closed Able to stand 10 seconds with supervision   Standing Ubsupported with Feet Together Able to place feet together independently and stand for 1 minute with supervision   From Standing, Reach Forward with Outstretched Arm Can reach confidently >25 cm (10")   From Standing Position, Pick up Object from [place] to pick up shoe safely and easily   From Standing Position, Turn to Look Behind Over each Shoulder Looks behind from both sides and weight shifts well   Turn 360 Degrees Able to turn 360 degrees safely in 4 seconds or less   Standing Unsupported, Alternately Place Feet on Step/Stool Able to complete 4 steps without aid or supervision   Standing Unsupported, One Foot in Front Able to take small step independently and hold 30 seconds   Standing on One Leg Tries to lift leg/unable to  hold 3 seconds but remains standing independently   Total Score 45                   [facility] Adult PT Treatment/Exercise - 04/30/16 0001      Exercises   Exercises Knee/Hip     Knee/Hip Exercises: Aerobic   Nustep Level 3 x 8 minutes.                  PT Short Term Goals - 04/30/16 1657      PT SHORT TERM GOAL #1   Title Independent with a HEP.   Time 4   Period Weeks   Status New           PT Long Term Goals - 04/30/16 1657      PT LONG TERM GOAL #1   Title Improve Berg score to 51-52/56.   Time 8   Period Weeks   Status New               Plan - 04/30/16 1417    Clinical Impression Statement The patient reports she has had 2 falls over the last 6 months.  Her Berg score= 45/56 and she demonstrates a negative Romberg test.  Minimal bialetral LE weakness.   Rehab Potential Excellent   Clinical Impairments Affecting Rehab Potential Right sciatica.   PT Frequency 2x / week   PT Duration 8 weeks      Patient will benefit from skilled therapeutic intervention in order to improve the following deficits and impairments:  Decreased balance  Visit Diagnosis: Unsteadiness on feet - Plan: PT plan of care cert/re-cert      G-Codes - 77/93/90 1658    Functional Assessment Tool Used Clinical judgement.   Functional Limitation Mobility: Walking and moving around   Mobility: Walking and Moving Around Current Status [phone]) At least 40 percent but less than 60 percent impaired, limited or restricted   Mobility: Walking and Moving Around Goal Status [phone]) At least 20 percent but less than 40 percent impaired, limited or restricted       Problem List Patient Active Problem List   Diagnosis Date Noted  . Sciatica of right side 04/23/2016  . At high risk for falls 04/23/2016  . Increased BMI 09/30/2015  . Hyperlipidemia 09/07/2015  . Low back pain 09/07/2015  . Hypoxia 09/06/2015  . Bronchitis 09/06/2015  . Orthostatic syncope 09/04/2015   .  Orthostatic hypotension 09/04/2015  . Thoracic aortic atherosclerosis ([place]) 05/03/2015  . Vitamin D deficiency 02/02/2015  . Pre-diabetes 02/02/2015  . Metabolic syndrome 70/72/1711  . Osteoporosis 03/11/2013  . Atrial fibrillation, unspecified 11/24/2012  . Hypokalemia 05/04/2012  . Atypical chest pain 05/04/2012  . Obesity   . Symptomatic menopausal or female climacteric states   . Kyphosis   . Gallstones   . Personal history of adenomatous colonic polyps   . Essential hypertension 03/29/2009   PHYSICAL THERAPY DISCHARGE SUMMARY  Visits from Start of Care: 1.  Current functional level related to goals / functional outcomes: See above.   Remaining deficits: See above.   Education / Equipment:  Plan: Patient agrees to discharge.  Patient goals were not met. Patient is being discharged due to not returning since the last visit.  ?????       Saraiyah Hemminger, [place] MPT 04/30/2016, 5:01 PM  [facility] [address] [place], [place], [address] Phone: [phone]   Fax:  [phone]  Name: ACADIA THAMMAVONG MRN: [id number] Date of Birth: 1934/07/21

## 2016-05-02 NOTE — Progress Notes (Signed)
PT evaluation addendum Late entry for 09/07/15 PT diagnosis    09/07/15 1659  PT Assessment  PT Therapy Diagnosis  Difficulty walking;Generalized weakness (unsteadiness on feet)  PT Recommendation/Assessment Patient needs continued PT services  PT Problem List Decreased balance;Decreased activity tolerance;Decreased mobility   05/02/2016 [facility], [place]

## 2016-05-09 ENCOUNTER — Other Ambulatory Visit: Payer: Self-pay | Admitting: Family Medicine

## 2016-05-25 ENCOUNTER — Ambulatory Visit (INDEPENDENT_AMBULATORY_CARE_PROVIDER_SITE_OTHER): Payer: Medicare Other | Admitting: Family Medicine

## 2016-05-25 ENCOUNTER — Encounter: Payer: Self-pay | Admitting: Family Medicine

## 2016-05-25 VITALS — BP 142/78 | HR 90 | Temp 98.0°F | Ht 60.0 in | Wt 154.0 lb

## 2016-05-25 DIAGNOSIS — I700 Atherosclerosis of aorta: Secondary | ICD-10-CM

## 2016-05-25 DIAGNOSIS — I4891 Unspecified atrial fibrillation: Secondary | ICD-10-CM | POA: Diagnosis not present

## 2016-05-25 DIAGNOSIS — E785 Hyperlipidemia, unspecified: Secondary | ICD-10-CM | POA: Diagnosis not present

## 2016-05-25 DIAGNOSIS — I10 Essential (primary) hypertension: Secondary | ICD-10-CM

## 2016-05-25 DIAGNOSIS — E559 Vitamin D deficiency, unspecified: Secondary | ICD-10-CM | POA: Diagnosis not present

## 2016-05-25 DIAGNOSIS — Z23 Encounter for immunization: Secondary | ICD-10-CM | POA: Diagnosis not present

## 2016-05-25 NOTE — Progress Notes (Signed)
Subjective:    Patient ID: Kari [name], female    DOB: 12-30-1933, 82 y.o.   MRN: [id number]  HPI Pt here for follow up and management of chronic medical problems which includes hypertension and a fib. She is taking medications regularly.The patient is doing well with no complaints. She stays active. She does check her blood pressures at home but does not remember exactly what they have been running but says they've been running in the 130s over the 70s. She has these readings but did not bring them with her to the office today. She denies any chest pain pressure palpitations or tightness. She has no trouble with her intestinal tract with swallowing heartburn indigestion nausea vomiting diarrhea or blood in the stool. She is passing her water without problems. She had cataracts worked on in both eyes in January and does not need her glasses as much. The patient is positive it and stays active.     Patient Active Problem List   Diagnosis Date Noted  . Sciatica of right side 04/23/2016  . At high risk for falls 04/23/2016  . Increased BMI 09/30/2015  . Hyperlipidemia 09/07/2015  . Low back pain 09/07/2015  . Hypoxia 09/06/2015  . Bronchitis 09/06/2015  . Orthostatic syncope 09/04/2015  . Orthostatic hypotension 09/04/2015  . Thoracic aortic atherosclerosis ([place]) 05/03/2015  . Vitamin D deficiency 02/02/2015  . Pre-diabetes 02/02/2015  . Metabolic syndrome 30/03/6225  . Osteoporosis 03/11/2013  . Atrial fibrillation, unspecified 11/24/2012  . Hypokalemia 05/04/2012  . Atypical chest pain 05/04/2012  . Obesity   . Symptomatic menopausal or female climacteric states   . Kyphosis   . Gallstones   . Personal history of adenomatous colonic polyps   . Essential hypertension 03/29/2009   Outpatient Encounter Prescriptions as of 05/25/2016  Medication Sig  . albuterol (PROVENTIL HFA;VENTOLIN HFA) 108 (90 Base) MCG/ACT inhaler Inhale 2 puffs into the lungs every 6 (six) hours as needed  for wheezing or shortness of breath.  Marland Kitchen atenolol (TENORMIN) 50 MG tablet TAKE (1) TABLET TWICE A DAY.  . captopril (CAPOTEN) 50 MG tablet TAKE (1) TABLET TWICE A DAY FOR HIGH BLOOD PRESSURE.  . meloxicam (MOBIC) 15 MG tablet Take 1 tablet (15 mg total) by mouth daily.  . pravastatin (PRAVACHOL) 80 MG tablet TAKE 1 TABLET ONCE DAILY FOR CHOLESTEROL  . Vitamin D, Ergocalciferol, (DRISDOL) 50000 units CAPS capsule Take 1 capsule (50,000 Units total) by mouth every 7 (seven) days.  Marland Kitchen warfarin (COUMADIN) 2.5 MG tablet TAKE 1 TABLET ONCE DAILY OR AS DIRECTED  . ZETIA 10 MG tablet TAKE 1 TABLET ONCE DAILY FOR CHOLESTEROL   No facility-administered encounter medications on file as of 05/25/2016.       Review of Systems  Constitutional: Negative.   HENT: Negative.   Eyes: Negative.   Respiratory: Negative.   Cardiovascular: Negative.   Gastrointestinal: Negative.   Endocrine: Negative.   Genitourinary: Negative.   Musculoskeletal: Negative.   Skin: Negative.   Allergic/Immunologic: Negative.   Neurological: Negative.   Hematological: Negative.   Psychiatric/Behavioral: Negative.        Objective:   Physical Exam  Constitutional: She is oriented to person, place, and time. She appears well-developed and well-nourished. No distress.  Relaxed, Pleasant and alert  HENT:  Head: Normocephalic and atraumatic.  Right Ear: External ear normal.  Left Ear: External ear normal.  Nose: Nose normal.  Mouth/Throat: Oropharynx is clear and moist.  Eyes: Conjunctivae and EOM are normal. Pupils are equal,  round, and reactive to light. Right eye exhibits no discharge. Left eye exhibits no discharge. No scleral icterus.  Neck: Normal range of motion. Neck supple. No thyromegaly present.  No bruits or thyromegaly  Cardiovascular: Normal rate, normal heart sounds and intact distal pulses.   No murmur heard. The heart is irregular irregular at 60-72/m  Pulmonary/Chest: Effort normal and breath sounds  normal. No respiratory distress. She has no wheezes. She has no rales.  Clear anteriorly and posteriorly  Abdominal: Soft. Bowel sounds are normal. She exhibits no mass. There is no tenderness. There is no rebound and no guarding.  No organ enlargement masses or tenderness and no bruits  Musculoskeletal: Normal range of motion. She exhibits no edema.  Lymphadenopathy:    She has no cervical adenopathy.  Neurological: She is alert and oriented to person, place, and time. She has normal reflexes. No cranial nerve deficit.  Skin: Skin is warm and dry. No rash noted.  Psychiatric: She has a normal mood and affect. Her behavior is normal. Judgment and thought content normal.  Nursing note and vitals reviewed.  BP (!) 158/95 (BP Location: Right Arm)   Pulse 90   Temp 98 F (36.7 C) (Oral)   Ht 5' (1.524 m)   Wt 154 lb (69.9 kg)   BMI 30.08 kg/m         Assessment & Plan:  1. Atrial fibrillation, unspecified -Continue current treatment and follow-up with cardiology as planned - CBC with Differential/Platelet  2. Essential hypertension -The repeat blood pressure today was 142/78 in the right arm sitting with a large cuff. She will bring blood pressure readings by for review that she has at home and we'll continue to monitor blood pressures at home and bring these readings to the next visit. - CBC with Differential/Platelet - BMP8+EGFR - Hepatic function panel  3. Thoracic aortic atherosclerosis ([place]) -Continue with aggressive therapeutic lifestyle changes and pravastatin and Zetia - CBC with Differential/Platelet - Lipid panel  4. Vitamin D deficiency -Continue with vitamin D replacement pending results of lab work - CBC with Differential/Platelet - VITAMIN D 25 Hydroxy (Vit-D Deficiency, Fractures)  5. Hyperlipidemia -Continue with as aggressive therapeutic lifestyle changes as possible and current statin drug pending results of lab work - CBC with Differential/Platelet -  Lipid panel   Patient Instructions                       Medicare Annual Wellness Visit  [facility] and the medical providers at [facility] strive to bring you the best medical care.  In doing so we not only want to address your current medical conditions and concerns but also to detect new conditions early and prevent illness, disease and health-related problems.    Medicare offers a yearly Wellness Visit which allows our clinical staff to assess your need for preventative services including immunizations, lifestyle education, counseling to decrease risk of preventable diseases and screening for fall risk and other medical concerns.    This visit is provided free of charge (no copay) for all Medicare recipients. The clinical pharmacists at [facility] have begun to conduct these Wellness Visits which will also include a thorough review of all your medications.    As you primary medical provider recommend that you make an appointment for your Annual Wellness Visit if you have not done so already this year.  You may set up this appointment before you leave today or you may  call back ([phone]) and schedule an appointment.  Please make sure when you call that you mention that you are scheduling your Annual Wellness Visit with the clinical pharmacist so that the appointment may be made for the proper length of time.     Continue current medications. Continue good therapeutic lifestyle changes which include good diet and exercise. Fall precautions discussed with patient. If an FOBT was given today- please return it to our front desk. If you are over 42 years old - you may need Prevnar 49 or the adult Pneumonia vaccine.  **Flu shots are available--- please call and schedule a FLU-CLINIC appointment**  After your visit with [place] today you will receive a survey in the mail or online from [employer] regarding your care with [place]. Please take a moment to  fill this out. Your feedback is very important to [place] as you can help [place] better understand your patient needs as well as improve your experience and satisfaction. WE CARE ABOUT YOU!!!   Stay active physically and do not put yourself at risk for falling The flu shot that you received today may make your arm sore. Monitor blood pressures closely at home and watch sodium intake We will call with lab work results as soon as those results become available     Arrie Senate MD

## 2016-05-25 NOTE — Patient Instructions (Addendum)
Medicare Annual Wellness Visit  [facility] and the medical providers at [facility] strive to bring you the best medical care.  In doing so we not only want to address your current medical conditions and concerns but also to detect new conditions early and prevent illness, disease and health-related problems.    Medicare offers a yearly Wellness Visit which allows our clinical staff to assess your need for preventative services including immunizations, lifestyle education, counseling to decrease risk of preventable diseases and screening for fall risk and other medical concerns.    This visit is provided free of charge (no copay) for all Medicare recipients. The clinical pharmacists at [facility] have begun to conduct these Wellness Visits which will also include a thorough review of all your medications.    As you primary medical provider recommend that you make an appointment for your Annual Wellness Visit if you have not done so already this year.  You may set up this appointment before you leave today or you may call back [phone] and schedule an appointment.  Please make sure when you call that you mention that you are scheduling your Annual Wellness Visit with the clinical pharmacist so that the appointment may be made for the proper length of time.     Continue current medications. Continue good therapeutic lifestyle changes which include good diet and exercise. Fall precautions discussed with patient. If an FOBT was given today- please return it to our front desk. If you are over 84 years old - you may need Prevnar 33 or the adult Pneumonia vaccine.  **Flu shots are available--- please call and schedule a FLU-CLINIC appointment**  After your visit with [place] today you will receive a survey in the mail or online from [employer] regarding your care with [place]. Please take a moment to fill this out. Your feedback is very  important to [place] as you can help [place] better understand your patient needs as well as improve your experience and satisfaction. WE CARE ABOUT YOU!!!   Stay active physically and do not put yourself at risk for falling The flu shot that you received today may make your arm sore. Monitor blood pressures closely at home and watch sodium intake We will call with lab work results as soon as those results become available

## 2016-05-26 LAB — CBC WITH DIFFERENTIAL/PLATELET
Basophils Absolute: 0 x10E3/uL (ref 0.0–0.2)
Basos: 1 %
EOS (ABSOLUTE): 0.3 x10E3/uL (ref 0.0–0.4)
Eos: 5 %
Hematocrit: 39.6 % (ref 34.0–46.6)
Hemoglobin: 11.6 g/dL (ref 11.1–15.9)
Immature Grans (Abs): 0 x10E3/uL (ref 0.0–0.1)
Immature Granulocytes: 0 %
Lymphocytes Absolute: 1.2 x10E3/uL (ref 0.7–3.1)
Lymphs: 18 %
MCH: 23.5 pg — ABNORMAL LOW (ref 26.6–33.0)
MCHC: 29.3 g/dL — ABNORMAL LOW (ref 31.5–35.7)
MCV: 80 fL (ref 79–97)
Monocytes Absolute: 0.5 x10E3/uL (ref 0.1–0.9)
Monocytes: 8 %
Neutrophils Absolute: 4.5 x10E3/uL (ref 1.4–7.0)
Neutrophils: 68 %
Platelets: 236 x10E3/uL (ref 150–379)
RBC: 4.94 x10E6/uL (ref 3.77–5.28)
RDW: 19 % — ABNORMAL HIGH (ref 12.3–15.4)
WBC: 6.5 x10E3/uL (ref 3.4–10.8)

## 2016-05-26 LAB — HEPATIC FUNCTION PANEL
ALBUMIN: 3.8 g/dL (ref 3.5–4.7)
ALT: 8 IU/L (ref 0–32)
AST: 19 IU/L (ref 0–40)
Alkaline Phosphatase: 68 IU/L (ref 39–117)
BILIRUBIN TOTAL: 0.5 mg/dL (ref 0.0–1.2)
Bilirubin, Direct: 0.16 mg/dL (ref 0.00–0.40)
TOTAL PROTEIN: 6.4 g/dL (ref 6.0–8.5)

## 2016-05-26 LAB — LIPID PANEL
CHOL/HDL RATIO: 2.8 ratio (ref 0.0–4.4)
Cholesterol, Total: 124 mg/dL (ref 100–199)
HDL: 45 mg/dL (ref >39)
LDL Calculated: 57 mg/dL (ref 0–99)
TRIGLYCERIDES: 109 mg/dL (ref 0–149)
VLDL CHOLESTEROL CAL: 22 mg/dL (ref 5–40)

## 2016-05-26 LAB — BMP8+EGFR
BUN/Creatinine Ratio: 13 (ref 12–28)
BUN: 7 mg/dL — ABNORMAL LOW (ref 8–27)
CALCIUM: 9 mg/dL (ref 8.7–10.3)
CHLORIDE: 100 mmol/L (ref 96–106)
CO2: 28 mmol/L (ref 18–29)
Creatinine, Ser: 0.54 mg/dL — ABNORMAL LOW (ref 0.57–1.00)
GFR calc Af Amer: 103 mL/min/{1.73_m2} (ref >59)
GFR calc non Af Amer: 89 mL/min/{1.73_m2} (ref >59)
GLUCOSE: 108 mg/dL — AB (ref 65–99)
POTASSIUM: 3.6 mmol/L (ref 3.5–5.2)
SODIUM: 142 mmol/L (ref 134–144)

## 2016-05-26 LAB — VITAMIN D 25 HYDROXY (VIT D DEFICIENCY, FRACTURES): VIT D 25 HYDROXY: 21.9 ng/mL — AB (ref 30.0–100.0)

## 2016-05-29 ENCOUNTER — Telehealth: Payer: Self-pay | Admitting: Family Medicine

## 2016-05-29 NOTE — Telephone Encounter (Signed)
Patient aware of results.

## 2016-06-06 ENCOUNTER — Other Ambulatory Visit: Payer: Self-pay | Admitting: Family Medicine

## 2016-06-08 ENCOUNTER — Other Ambulatory Visit: Payer: Self-pay

## 2016-06-08 ENCOUNTER — Emergency Department (HOSPITAL_COMMUNITY)
Admission: EM | Admit: 2016-06-08 | Discharge: 2016-06-08 | Disposition: A | Discharge status: Home / Self Care | Payer: Medicare Other | Attending: Emergency Medicine | Admitting: Emergency Medicine

## 2016-06-08 ENCOUNTER — Encounter (HOSPITAL_COMMUNITY): Payer: Self-pay

## 2016-06-08 ENCOUNTER — Emergency Department (HOSPITAL_COMMUNITY): Payer: Medicare Other

## 2016-06-08 DIAGNOSIS — I10 Essential (primary) hypertension: Secondary | ICD-10-CM | POA: Insufficient documentation

## 2016-06-08 DIAGNOSIS — S0083XA Contusion of other part of head, initial encounter: Secondary | ICD-10-CM | POA: Insufficient documentation

## 2016-06-08 DIAGNOSIS — S0990XA Unspecified injury of head, initial encounter: Secondary | ICD-10-CM | POA: Diagnosis present

## 2016-06-08 DIAGNOSIS — Y939 Activity, unspecified: Secondary | ICD-10-CM | POA: Insufficient documentation

## 2016-06-08 DIAGNOSIS — Z79899 Other long term (current) drug therapy: Secondary | ICD-10-CM | POA: Insufficient documentation

## 2016-06-08 DIAGNOSIS — W19XXXA Unspecified fall, initial encounter: Secondary | ICD-10-CM

## 2016-06-08 DIAGNOSIS — Z7901 Long term (current) use of anticoagulants: Secondary | ICD-10-CM | POA: Diagnosis not present

## 2016-06-08 DIAGNOSIS — W06XXXA Fall from bed, initial encounter: Secondary | ICD-10-CM | POA: Insufficient documentation

## 2016-06-08 DIAGNOSIS — Y929 Unspecified place or not applicable: Secondary | ICD-10-CM | POA: Insufficient documentation

## 2016-06-08 DIAGNOSIS — Y999 Unspecified external cause status: Secondary | ICD-10-CM | POA: Diagnosis not present

## 2016-06-08 DIAGNOSIS — E876 Hypokalemia: Secondary | ICD-10-CM | POA: Insufficient documentation

## 2016-06-08 DIAGNOSIS — S0003XA Contusion of scalp, initial encounter: Secondary | ICD-10-CM | POA: Diagnosis not present

## 2016-06-08 DIAGNOSIS — S199XXA Unspecified injury of neck, initial encounter: Secondary | ICD-10-CM | POA: Diagnosis not present

## 2016-06-08 LAB — CBC WITH DIFFERENTIAL/PLATELET
BASOS ABS: 0 10*3/uL (ref 0.0–0.1)
BASOS PCT: 0 %
EOS ABS: 0.1 10*3/uL (ref 0.0–0.7)
Eosinophils Relative: 1 %
HCT: 41.1 % (ref 36.0–46.0)
HEMOGLOBIN: 12.5 g/dL (ref 12.0–15.0)
Lymphocytes Relative: 11 %
Lymphs Abs: 1 10*3/uL (ref 0.7–4.0)
MCH: 24.5 pg — ABNORMAL LOW (ref 26.0–34.0)
MCHC: 30.4 g/dL (ref 30.0–36.0)
MCV: 80.4 fL (ref 78.0–100.0)
Monocytes Absolute: 0.5 10*3/uL (ref 0.1–1.0)
Monocytes Relative: 5 %
NEUTROS PCT: 83 %
Neutro Abs: 7.5 10*3/uL (ref 1.7–7.7)
Platelets: 241 10*3/uL (ref 150–400)
RBC: 5.11 MIL/uL (ref 3.87–5.11)
RDW: 18.1 % — ABNORMAL HIGH (ref 11.5–15.5)
WBC: 9.1 10*3/uL (ref 4.0–10.5)

## 2016-06-08 LAB — PROTIME-INR
INR: 2.36
PROTHROMBIN TIME: 26.2 s — AB (ref 11.4–15.2)

## 2016-06-08 LAB — URINE MICROSCOPIC-ADD ON

## 2016-06-08 LAB — URINALYSIS, ROUTINE W REFLEX MICROSCOPIC
BILIRUBIN URINE: NEGATIVE
Glucose, UA: NEGATIVE mg/dL
Ketones, ur: NEGATIVE mg/dL
NITRITE: NEGATIVE
SPECIFIC GRAVITY, URINE: 1.01 (ref 1.005–1.030)
pH: 8 (ref 5.0–8.0)

## 2016-06-08 LAB — BASIC METABOLIC PANEL
ANION GAP: 7 (ref 5–15)
BUN: 6 mg/dL (ref 6–20)
CALCIUM: 9 mg/dL (ref 8.9–10.3)
CO2: 31 mmol/L (ref 22–32)
CREATININE: 0.55 mg/dL (ref 0.44–1.00)
Chloride: 102 mmol/L (ref 101–111)
GFR calc Af Amer: >60 mL/min (ref >60)
Glucose, Bld: 122 mg/dL — ABNORMAL HIGH (ref 65–99)
Potassium: 2.9 mmol/L — ABNORMAL LOW (ref 3.5–5.1)
Sodium: 140 mmol/L (ref 135–145)

## 2016-06-08 MED ORDER — CIPROFLOXACIN HCL 500 MG PO TABS: 500.0000 mg | ORAL_TABLET | Freq: Two times a day (BID) | ORAL | 0 refills | Status: DC

## 2016-06-08 MED ORDER — POTASSIUM CHLORIDE CRYS ER 20 MEQ PO TBCR
40.0000 meq | EXTENDED_RELEASE_TABLET | Freq: Once | ORAL | Status: AC
  Administered 2016-06-08: 40 meq via ORAL
  Filled 2016-06-08: qty 2

## 2016-06-08 NOTE — Discharge Instructions (Signed)
Your potassium is low, please increase foods high in potassium. Your urine tests shows possible infection. I have sent a culture to the lab. Use cipro 2 times daily with food. Please return if any increase in headache or changes in your general condition.

## 2016-06-08 NOTE — ED Notes (Signed)
Pt made aware to return if symptoms worsen or if any life threatening symptoms occur.   

## 2016-06-08 NOTE — ED Provider Notes (Signed)
[facility] DEPT Provider Note   CSN: [id number] Arrival date & time: 06/08/16  0845     History   Chief Complaint Chief Complaint  Patient presents with  . Fall    HPI Kari [name] is a 82 y.o. female.  Patient is an 82 year old female who presents to the emergency department with a complaint of a fall.  The patient states that she got up to go to the bathroom, she slipped off the edge of the bed, and hit her head on a nightstand. There was no loss of consciousness. She denies any neck pain, chest pain, abdominal pain, pelvis pain, or extremity pain. She denies any nausea, or vomiting. There was no loss of consciousness reported. Patient states she came because she is on a blood thinner and hit her head. She denies any vision changes, or difficulty with speech, or difficulty with balance.      Past Medical History:  Diagnosis Date  . Adenomatous polyps   . AF (atrial fibrillation) ([place])   . Cataract   . Gallstones   . Hx of adenomatous colonic polyps   . Hyperlipidemia    x5 years  . Hypertension    x5 years  . Kyphosis   . Metabolic syndrome X   . Obesity   . Osteoporosis   . Symptomatic menopausal or female climacteric states   . Vitamin D deficiency     Patient Active Problem List   Diagnosis Date Noted  . Sciatica of right side 04/23/2016  . At high risk for falls 04/23/2016  . Increased BMI 09/30/2015  . Hyperlipidemia 09/07/2015  . Low back pain 09/07/2015  . Hypoxia 09/06/2015  . Bronchitis 09/06/2015  . Orthostatic syncope 09/04/2015  . Orthostatic hypotension 09/04/2015  . Thoracic aortic atherosclerosis ([place]) 05/03/2015  . Vitamin D deficiency 02/02/2015  . Pre-diabetes 02/02/2015  . Metabolic syndrome [id number]  . Osteoporosis 03/11/2013  . Atrial fibrillation, unspecified 11/24/2012  . Hypokalemia 05/04/2012  . Atypical chest pain 05/04/2012  . Obesity   . Symptomatic menopausal or female climacteric states   . Kyphosis   .  Gallstones   . Personal history of adenomatous colonic polyps   . Essential hypertension 03/29/2009    Past Surgical History:  Procedure Laterality Date  . ABDOMINAL HYSTERECTOMY    . BLADDER SUSPENSION    . COLONOSCOPY  multiple  . EYE SURGERY Bilateral 09/2015  . VAGINAL HYSTERECTOMY     total    OB History    No data available       Home Medications    Prior to Admission medications   Medication Sig Start Date End Date Taking? Authorizing Provider  albuterol (PROVENTIL HFA;VENTOLIN HFA) 108 (90 Base) MCG/ACT inhaler Inhale 2 puffs into the lungs every 6 (six) hours as needed for wheezing or shortness of breath. 09/08/15   Nishant Dhungel, MD  atenolol (TENORMIN) 50 MG tablet TAKE (1) TABLET TWICE A DAY. 06/06/16   Chipper Herb, MD  captopril (CAPOTEN) 50 MG tablet TAKE (1) TABLET TWICE A DAY FOR HIGH BLOOD PRESSURE. 04/26/16   Chipper Herb, MD  meloxicam (MOBIC) 15 MG tablet Take 1 tablet (15 mg total) by mouth daily. 03/22/16   Mary-Margaret Hassell Done, FNP  pravastatin (PRAVACHOL) 80 MG tablet TAKE 1 TABLET ONCE DAILY FOR CHOLESTEROL 01/10/16   Chipper Herb, MD  Vitamin D, Ergocalciferol, (DRISDOL) 50000 units CAPS capsule Take 1 capsule (50,000 Units total) by mouth every 7 (seven) days. 01/16/16   Estella Husk  Laurance Flatten, MD  warfarin (COUMADIN) 2.5 MG tablet TAKE 1 TABLET ONCE DAILY OR AS DIRECTED 03/05/16   Chipper Herb, MD  ZETIA 10 MG tablet TAKE 1 TABLET ONCE DAILY FOR CHOLESTEROL 01/10/16   Chipper Herb, MD    Family History Family History  Problem Relation Age of Onset  . Heart attack Mother   . Stroke Mother   . Heart attack Father   . Alcohol abuse Father   . Stroke Brother   . Heart attack Brother   . Depression Brother     suicide  . Heart attack Other   . Stroke Other   . Breast cancer Other   . Colon cancer Neg Hx     Social History Social History  Substance Use Topics  . Smoking status: Never Smoker  . Smokeless tobacco: Never Used  . Alcohol use No      Allergies   Erythromycin and Penicillins   Review of Systems Review of Systems  Cardiovascular: Positive for palpitations.  Musculoskeletal: Positive for arthralgias and back pain.  All other systems reviewed and are negative.    Physical Exam Updated Vital Signs BP 157/79 (BP Location: Left Arm)   Pulse 90   Temp 98 F (36.7 C) (Oral)   Resp 16   Ht 5\' 1"  (1.549 m)   Wt 68 kg   BMI 28.34 kg/m   Physical Exam  Constitutional: She appears well-developed and well-nourished. No distress.  HENT:  Head: Normocephalic. Head is with contusion. Head is without Battle's sign.    Right Ear: External ear normal.  Left Ear: External ear normal.  Eyes: Conjunctivae are normal. Right eye exhibits no discharge. Left eye exhibits no discharge. No scleral icterus.  Neck: Neck supple. No tracheal deviation present.  Cardiovascular: Normal rate, regular rhythm and intact distal pulses.  Exam reveals no gallop and no friction rub.   Pulmonary/Chest: Effort normal and breath sounds normal. No stridor. No respiratory distress. She has no wheezes. She has no rales.  Abdominal: Soft. Bowel sounds are normal. She exhibits no distension. There is no tenderness. There is no rebound and no guarding.  Musculoskeletal: She exhibits no edema or tenderness.  Kyphosis present, but no pain to palpation of the cervical, thoracic, or lumbar spine. There is some straight leg raise pain, but patient states that she has a history of sciatica.  There is no pain to movement of the pelvis on. There is no shortening appreciated of the lower extremities. There is full range of motion of the upper extremities.  Neurological: She is alert. She has normal strength. No cranial nerve deficit (no facial droop, extraocular movements intact, no slurred speech) or sensory deficit. She exhibits normal muscle tone. She displays no seizure activity. Coordination normal.  Skin: Skin is warm and dry. No rash noted.   Psychiatric: She has a normal mood and affect.  Nursing note and vitals reviewed.    ED Treatments / Results  Labs (all labs ordered are listed, but only abnormal results are displayed) Labs Reviewed  PROTIME-INR - Abnormal; Notable for the following:       Result Value   Prothrombin Time 26.2 (*)    All other components within normal limits  BASIC METABOLIC PANEL - Abnormal; Notable for the following:    Potassium 2.9 (*)    Glucose, Bld 122 (*)    All other components within normal limits  CBC WITH DIFFERENTIAL/PLATELET - Abnormal; Notable for the following:    MCH 24.5 (*)  RDW 18.1 (*)    All other components within normal limits  URINALYSIS, ROUTINE W REFLEX MICROSCOPIC (NOT AT [facility])    EKG  EKG Interpretation None       Radiology No results found.  Procedures Procedures (including critical care time)  Medications Ordered in ED Medications - No data to display   Initial Impression / Assessment and Plan / ED Course Patient seen with me by  Dr Wilson Singer.  I have reviewed the triage vital signs and the nursing notes.  Pertinent labs & imaging results that were available during my care of the patient were reviewed by me and considered in my medical decision making (see chart for details).  Clinical Course    *I have reviewed nursing notes, vital signs, and all appropriate lab and imaging results for this patient.**  Final Clinical Impressions(s) / ED Diagnoses  Patient's blood pressure is elevated at 158/112, otherwise the vital signs are within normal limits. Pulse oximetry is 97% on room.  The PT/INR shows INR to be 2.36. The basic metabolic panel shows the potassium to be low at 2.9. Oral potassium given in the emergency department. The remainder of the basic metabolic panel was essentially within normal limits. The complete blood count was nonacute. The urine analysis reveals on too many to count epithelial cells, and to many to count white blood cells.  There many bacteria present. There is a large leukocyte esterase present. A culture of the urine has been sent to the lab. The patient will be started on Cipro until cultures return. Patient is allergic to penicillins.  The CT scan of the head and neck are negative for fracture or acute findings on. I discussed the findings with the patient and family in terms which they understand. The patient was advised to return immediately if any change in headache, balance, dizziness, difficulty with speaking or swallowing, or changes in general condition. The patient will use an ice pack to the hematoma area on, and have her doctor to keep a close check on her PT/INR ratios. Also discussed the need for foods high in potassium, and the use of Cipro 2 times daily. Patient is in agreement with this discharge plan.    Final diagnoses:  None    New Prescriptions New Prescriptions   No medications on file     Lily Kocher, PA-C 06/08/16 [place], MD 06/11/16 [phone]

## 2016-06-08 NOTE — ED Notes (Signed)
Pt has hematoma size of quarter on left forehead.

## 2016-06-08 NOTE — ED Triage Notes (Signed)
Pt reports she fell this am around 0400 while trying to get back in bed and hit her head on the night stand.  Pt has hematoma to left side of forehead.  Denies any n/v or dizziness.  Reports head feels sore.  Pt went to PCP and was sent here for evaluation.

## 2016-06-10 LAB — URINE CULTURE

## 2016-06-11 ENCOUNTER — Encounter: Payer: Self-pay | Admitting: Family Medicine

## 2016-06-11 ENCOUNTER — Ambulatory Visit (INDEPENDENT_AMBULATORY_CARE_PROVIDER_SITE_OTHER): Payer: Medicare Other | Admitting: Family Medicine

## 2016-06-11 VITALS — BP 134/84 | HR 88 | Temp 97.8°F | Ht 61.0 in | Wt 150.0 lb

## 2016-06-11 DIAGNOSIS — E8881 Metabolic syndrome: Secondary | ICD-10-CM | POA: Diagnosis not present

## 2016-06-11 DIAGNOSIS — S0083XA Contusion of other part of head, initial encounter: Secondary | ICD-10-CM

## 2016-06-11 DIAGNOSIS — E876 Hypokalemia: Secondary | ICD-10-CM | POA: Diagnosis not present

## 2016-06-11 DIAGNOSIS — Z09 Encounter for follow-up examination after completed treatment for conditions other than malignant neoplasm: Secondary | ICD-10-CM | POA: Diagnosis not present

## 2016-06-11 DIAGNOSIS — I10 Essential (primary) hypertension: Secondary | ICD-10-CM | POA: Diagnosis not present

## 2016-06-11 DIAGNOSIS — I4891 Unspecified atrial fibrillation: Secondary | ICD-10-CM | POA: Diagnosis not present

## 2016-06-11 LAB — POCT INR: INR: 3.2

## 2016-06-11 LAB — COAGUCHEK XS/INR WAIVED
INR: 3.2 — AB (ref 0.9–1.1)
Prothrombin Time: 38.7 s

## 2016-06-11 NOTE — Patient Instructions (Addendum)
Move more slowly Make sure you have a night light in the bedroom Always stand before you start moving The pro time had the blood being a little bit on the thin side and we will adjust your Coumadin today and have your pro time rechecked in a week   Anticoagulation Dose Instructions as of 06/11/2016      Kari [name] Tue Wed Thu Fri Sat   New Dose 2.5 mg 1.25 mg Hold 2.5 mg 2.5 mg 1.25 mg 2.5 mg    Description   Hold coumadin tomorrow as she has already taken 1/2 tab today 06/11/16. Then resume regular schedule on Wednesday

## 2016-06-11 NOTE — Progress Notes (Signed)
 Subjective:    Patient ID: Kari [name], female    DOB: 09/04/1933, 82 y.o.   MRN: [id number]  HPI Patient here today for hospital follow up from [facility] last Friday. She was seen there for a fall and hematoma to her face. The patient comes to the visit today with her daughter. She has a large left orbital hematoma that has swelling in her left cheek and left mandible area. The patient denies any chest pain shortness of breath GI symptoms nausea vomiting diarrhea or blood in the stool. She denies any trouble passing her water. She denies any trouble with headaches blurred vision or bleeding issues other than where she was hit with the fall. She clearly remembers what happened. She had both hands on the bed and her left hand slipped off the bed and she lost her balance and hit the furniture beside the bed. She actually went back to bed and did not realize how severe her injury was into she woke up the next morning. She has not had any neck pain. She was treated on its from here to the emergency room for the CT scan and that was reviewed with the patient and her daughter today and did not show any intracerebral blood clot or hemorrhage or stroke. It did indicate atherosclerosis. There were degenerative changes in the cervical spine but otherwise no changes in the cervical spine.    Patient Active Problem List   Diagnosis Date Noted  . Sciatica of right side 04/23/2016  . At high risk for falls 04/23/2016  . Increased BMI 09/30/2015  . Hyperlipidemia 09/07/2015  . Low back pain 09/07/2015  . Hypoxia 09/06/2015  . Bronchitis 09/06/2015  . Orthostatic syncope 09/04/2015  . Orthostatic hypotension 09/04/2015  . Thoracic aortic atherosclerosis ([facility]) 05/03/2015  . Vitamin D deficiency 02/02/2015  . Pre-diabetes 02/02/2015  . Metabolic syndrome 01/06/2014  . Osteoporosis 03/11/2013  . Atrial fibrillation, unspecified 11/24/2012  . Hypokalemia 05/04/2012  . Atypical chest pain 05/04/2012  .  Obesity   . Symptomatic menopausal or female climacteric states   . Kyphosis   . Gallstones   . Personal history of adenomatous colonic polyps   . Essential hypertension 03/29/2009   Outpatient Encounter Prescriptions as of 06/11/2016  Medication Sig  . albuterol (PROVENTIL HFA;VENTOLIN HFA) 108 (90 Base) MCG/ACT inhaler Inhale 2 puffs into the lungs every 6 (six) hours as needed for wheezing or shortness of breath.  . atenolol (TENORMIN) 50 MG tablet TAKE (1) TABLET TWICE A DAY.  . captopril (CAPOTEN) 50 MG tablet TAKE (1) TABLET TWICE A DAY FOR HIGH BLOOD PRESSURE.  . ciprofloxacin (CIPRO) 500 MG tablet Take 1 tablet (500 mg total) by mouth 2 (two) times daily.  . meloxicam (MOBIC) 15 MG tablet Take 1 tablet (15 mg total) by mouth daily.  . pravastatin (PRAVACHOL) 80 MG tablet TAKE 1 TABLET ONCE DAILY FOR CHOLESTEROL  . Vitamin D, Ergocalciferol, (DRISDOL) 50000 units CAPS capsule Take 1 capsule (50,000 Units total) by mouth every 7 (seven) days. (Patient taking differently: Take 50,000 Units by mouth every 7 (seven) days. Take on Monday's.)  . warfarin (COUMADIN) 2.5 MG tablet Take 1.25-2.5 mg by mouth daily. Take 0.5 tablet on Monday and Friday. Take 1 tablet on all the other days.  . ZETIA 10 MG tablet TAKE 1 TABLET ONCE DAILY FOR CHOLESTEROL   No facility-administered encounter medications on file as of 06/11/2016.       Review of Systems  Constitutional: Negative.     HENT: Negative.   Eyes: Negative.   Respiratory: Negative.   Cardiovascular: Negative.   Gastrointestinal: Negative.   Endocrine: Negative.   Genitourinary: Negative.        Recent UTI - on antibiotic  Musculoskeletal: Negative.   Skin: Positive for color change (bruising to face).  Allergic/Immunologic: Negative.   Neurological: Negative.   Hematological: Negative.   Psychiatric/Behavioral: Negative.        Objective:   Physical Exam  Constitutional: She is oriented to person, place, and time. She appears  well-developed and well-nourished. No distress.  HENT:  Head: Normocephalic.  Right Ear: External ear normal.  Left Ear: External ear normal.  Nose: Nose normal.  Mouth/Throat: Oropharynx is clear and moist. No oropharyngeal exudate.  There is a large contusion in the left side of her face and slightly to the right side of the face. There is a hematoma around the left orbit with swelling and to the left mandible area.  Eyes: Conjunctivae and EOM are normal. Pupils are equal, round, and reactive to light. Right eye exhibits no discharge. Left eye exhibits no discharge. No scleral icterus.  Neck: Normal range of motion. Neck supple. No thyromegaly present.  Cardiovascular: Normal rate and normal heart sounds.   No murmur heard. The heart remains irregular at about 84/m  Pulmonary/Chest: Effort normal and breath sounds normal. No respiratory distress. She has no wheezes. She has no rales.  Clear anteriorly and posteriorly and no sign of any bruising on her torso.  Musculoskeletal: Normal range of motion. She exhibits no edema.  Lymphadenopathy:    She has no cervical adenopathy.  Neurological: She is alert and oriented to person, place, and time. She has normal reflexes. No cranial nerve deficit.  Skin: Skin is warm and dry. No rash noted.  Psychiatric: She has a normal mood and affect. Her behavior is normal. Judgment and thought content normal.  The patient's mood is positive. She absolutely denies any complaints.  Nursing note and vitals reviewed.  BP 134/84 (BP Location: Left Arm)   Pulse 88   Temp 97.8 F (36.6 C) (Oral)   Ht 5' 1" (1.549 m)   Wt 150 lb (68 kg)   BMI 28.34 kg/m         Assessment & Plan:  1. Hospital discharge follow-up -The patient is doing well following her fall. Both of her children have been monitoring her closely. - CBC with Differential/Platelet - BMP8+EGFR - CoaguChek XS/INR Waived  2. Facial hematoma, initial encounter -The facial hematoma is  large but the patient was reassured that with time this would go away.  3. Hypokalemia -Check BMP today.  4. Increased INR -Hold Coumadin 1 day and recheck pro time in one week  Patient Instructions  Move more slowly Make sure you have a night light in the bedroom Always stand before you start moving The pro time had the blood being a little bit on the thin side and we will adjust your Coumadin today and have your pro time rechecked in a week  Don W. Moore MD   

## 2016-06-12 ENCOUNTER — Telehealth (HOSPITAL_BASED_OUTPATIENT_CLINIC_OR_DEPARTMENT_OTHER): Payer: Self-pay | Admitting: Emergency Medicine

## 2016-06-12 LAB — CBC WITH DIFFERENTIAL/PLATELET
BASOS ABS: 0 10*3/uL (ref 0.0–0.2)
BASOS: 0 %
EOS (ABSOLUTE): 0.1 10*3/uL (ref 0.0–0.4)
Eos: 2 %
Hematocrit: 37.5 % (ref 34.0–46.6)
Hemoglobin: 11.1 g/dL (ref 11.1–15.9)
IMMATURE GRANS (ABS): 0 10*3/uL (ref 0.0–0.1)
IMMATURE GRANULOCYTES: 0 %
LYMPHS: 20 %
Lymphocytes Absolute: 1.4 10*3/uL (ref 0.7–3.1)
MCH: 23.6 pg — AB (ref 26.6–33.0)
MCHC: 29.6 g/dL — ABNORMAL LOW (ref 31.5–35.7)
MCV: 80 fL (ref 79–97)
MONOCYTES: 9 %
Monocytes Absolute: 0.6 10*3/uL (ref 0.1–0.9)
NEUTROS ABS: 4.7 10*3/uL (ref 1.4–7.0)
Neutrophils: 69 %
PLATELETS: 240 10*3/uL (ref 150–379)
RBC: 4.71 x10E6/uL (ref 3.77–5.28)
RDW: 18.7 % — ABNORMAL HIGH (ref 12.3–15.4)
WBC: 6.8 10*3/uL (ref 3.4–10.8)

## 2016-06-12 LAB — BMP8+EGFR
BUN / CREAT RATIO: 13 (ref 12–28)
BUN: 8 mg/dL (ref 8–27)
CALCIUM: 9.4 mg/dL (ref 8.7–10.3)
CHLORIDE: 97 mmol/L (ref 96–106)
CO2: 28 mmol/L (ref 18–29)
CREATININE: 0.64 mg/dL (ref 0.57–1.00)
GFR calc Af Amer: 97 mL/min/{1.73_m2} (ref >59)
GFR calc non Af Amer: 85 mL/min/{1.73_m2} (ref >59)
GLUCOSE: 105 mg/dL — AB (ref 65–99)
Potassium: 3.7 mmol/L (ref 3.5–5.2)
SODIUM: 140 mmol/L (ref 134–144)

## 2016-06-12 NOTE — Telephone Encounter (Signed)
Post ED Visit - Positive Culture Follow-up  Culture report reviewed by antimicrobial stewardship pharmacist:  []  Elenor Quinones, Pharm.D. []  Heide Guile, Pharm.D., BCPS [x]  Parks Neptune, Pharm.D. []  Alycia Rossetti, Pharm.D., BCPS []  [place], [place].D., BCPS, AAHIVP []  Legrand Como, Pharm.D., BCPS, AAHIVP []  Milus Glazier, Pharm.D. []  Stephens November, Pharm.D.  Positive urine culture Treated with ciprofloxacin, organism sensitive to the same and no further patient follow-up is required at this time.  Hazle Nordmann 06/12/2016, 11:48 AM

## 2016-06-14 ENCOUNTER — Other Ambulatory Visit: Payer: Self-pay | Admitting: Family Medicine

## 2016-06-18 ENCOUNTER — Ambulatory Visit (INDEPENDENT_AMBULATORY_CARE_PROVIDER_SITE_OTHER): Payer: Medicare Other | Admitting: Pharmacist

## 2016-06-18 DIAGNOSIS — N390 Urinary tract infection, site not specified: Secondary | ICD-10-CM

## 2016-06-18 DIAGNOSIS — I4891 Unspecified atrial fibrillation: Secondary | ICD-10-CM

## 2016-06-18 LAB — URINALYSIS, COMPLETE
Bilirubin, UA: NEGATIVE
GLUCOSE, UA: NEGATIVE
Nitrite, UA: NEGATIVE
SPEC GRAV UA: 1.02 (ref 1.005–1.030)
Urobilinogen, Ur: 1 mg/dL (ref 0.2–1.0)
pH, UA: 8 — ABNORMAL HIGH (ref 5.0–7.5)

## 2016-06-18 LAB — COAGUCHEK XS/INR WAIVED
INR: 2.1 — ABNORMAL HIGH (ref 0.9–1.1)
PROTHROMBIN TIME: 25.3 s

## 2016-06-18 LAB — MICROSCOPIC EXAMINATION: WBC, UA: >30 /hpf — AB (ref 0–?)

## 2016-06-18 MED ORDER — DOXYCYCLINE HYCLATE 100 MG PO TABS: 100.0000 mg | ORAL_TABLET | Freq: Two times a day (BID) | ORAL | 0 refills | Status: DC

## 2016-06-18 NOTE — Addendum Note (Signed)
Addended by: Cherre Robins on: 06/18/2016 04:55 PM   Modules accepted: Orders

## 2016-06-18 NOTE — Progress Notes (Addendum)
Subjective:     Indication: atrial fibrillation Bleeding signs/symptoms: None Thromboembolic signs/symptoms: None  Missed Coumadin doses: missed 1 dose due to INR of 3.1 1 week ago Medication changes: yes - took cipro for UTI for 5 days last week Dietary changes: no Bacterial/viral infection: yes - UTI Other concerns: no Patient was hospitalized 06/08/2016 due to fall at home.  She was also found to have UTI while at hospital and cirpo given.  She has bruised from fall but is healing well.    Objective:    INR Today: 2.1 Current dose: warfarin 2.5.g - take 1/2 tablet mondays and fridays.  Take 1 tablet all other days.    Assessment:    Therapeutic INR for goal of 2-3   Follow up UTI  Plan:    1. New dose: no change   2. Next INR: 1 month   3.  Patient to leave urine sample today per Dr Tawanna Sat order but she was unable to void.  She will take specimen cup and return later today.    Urine specimen showed continued presence of bacteria and leukocytes.  Not enough sample to culture.  Will have patient bring in another sample tomorrow.  Discussed with Chevis Pretty, NP.  She suggested start doxycycline.  Patient ID: Kari [name], female   DOB: [birth year], 82 y.o.   MRN: [id number]

## 2016-06-19 ENCOUNTER — Other Ambulatory Visit (INDEPENDENT_AMBULATORY_CARE_PROVIDER_SITE_OTHER): Payer: Medicare Other

## 2016-06-19 DIAGNOSIS — R300 Dysuria: Secondary | ICD-10-CM | POA: Diagnosis not present

## 2016-06-21 LAB — URINE CULTURE

## 2016-06-27 ENCOUNTER — Encounter: Payer: Self-pay | Admitting: Pharmacist

## 2016-06-28 ENCOUNTER — Encounter: Payer: Self-pay | Admitting: *Deleted

## 2016-07-17 ENCOUNTER — Encounter: Payer: Self-pay | Admitting: Pharmacist

## 2016-07-19 ENCOUNTER — Ambulatory Visit (INDEPENDENT_AMBULATORY_CARE_PROVIDER_SITE_OTHER): Payer: Medicare Other | Admitting: Pharmacist

## 2016-07-19 DIAGNOSIS — I4891 Unspecified atrial fibrillation: Secondary | ICD-10-CM

## 2016-07-19 LAB — COAGUCHEK XS/INR WAIVED
INR: 2.5 — AB (ref 0.9–1.1)
PROTHROMBIN TIME: 29.4 s

## 2016-08-17 ENCOUNTER — Ambulatory Visit (INDEPENDENT_AMBULATORY_CARE_PROVIDER_SITE_OTHER): Payer: Medicare Other | Admitting: Family Medicine

## 2016-08-17 ENCOUNTER — Ambulatory Visit (INDEPENDENT_AMBULATORY_CARE_PROVIDER_SITE_OTHER): Payer: Medicare Other

## 2016-08-17 ENCOUNTER — Telehealth: Payer: Self-pay | Admitting: Family Medicine

## 2016-08-17 VITALS — BP 130/68 | HR 102 | Temp 97.3°F | Ht 61.0 in | Wt 155.5 lb

## 2016-08-17 DIAGNOSIS — M79601 Pain in right arm: Secondary | ICD-10-CM | POA: Diagnosis not present

## 2016-08-17 DIAGNOSIS — I10 Essential (primary) hypertension: Secondary | ICD-10-CM

## 2016-08-17 DIAGNOSIS — B029 Zoster without complications: Secondary | ICD-10-CM | POA: Diagnosis not present

## 2016-08-17 MED ORDER — TRAMADOL HCL 50 MG PO TABS: 50.0000 mg | ORAL_TABLET | Freq: Four times a day (QID) | ORAL | Status: DC | PRN

## 2016-08-17 MED ORDER — VALACYCLOVIR HCL 1 G PO TABS: 1000.0000 mg | ORAL_TABLET | Freq: Three times a day (TID) | ORAL | 0 refills | Status: DC

## 2016-08-17 NOTE — Progress Notes (Signed)
Subjective:  Patient ID: Kari [name], female    DOB: 30-Oct-1933  Age: 82 y.o. MRN: [id number]  CC: Arm Pain (pt having right arm pain for the past few days)   HPI METTIE BAIZ presents for Onset 3 days ago of a 5/10 headache. It started at the elbow didn't seem to move to the wrist and after that to the shoulder. Now it hurts in all 3 places intermittently. She denies any relation to position or activity. There is no known injury. It does make activity a little bit harder but she is still active using that extremity.   History Madigan has a past medical history of Adenomatous polyps; AF (atrial fibrillation) ([place]); Cataract; Gallstones; adenomatous colonic polyps; Hyperlipidemia; Hypertension; Kyphosis; Metabolic syndrome X; Obesity; Osteoporosis; Symptomatic menopausal or female climacteric states; and Vitamin D deficiency.   She has a past surgical history that includes Vaginal hysterectomy; Bladder suspension; Colonoscopy (multiple); Eye surgery (Bilateral, 09/2015); and Abdominal hysterectomy.   Her family history includes Alcohol abuse in her father; Breast cancer in her other; Depression in her brother; Heart attack in her brother, father, mother, and other; Stroke in her brother, mother, and other.She reports that she has never smoked. She has never used smokeless tobacco. She reports that she does not drink alcohol or use drugs.    ROS Review of Systems  Constitutional: Negative for activity change, appetite change and fever.  HENT: Negative for congestion, rhinorrhea and sore throat.   Eyes: Negative for visual disturbance.  Respiratory: Negative for cough and shortness of breath.   Cardiovascular: Negative for chest pain and palpitations.  Gastrointestinal: Negative for abdominal pain, diarrhea and nausea.  Genitourinary: Negative for dysuria.  Musculoskeletal: Positive for arthralgias. Negative for myalgias.    Objective:  BP 130/68   Pulse (!) 102   Temp 97.3 F  (36.3 C) (Oral)   Ht 5\' 1"  (1.549 m)   Wt 155 lb 8 oz (70.5 kg)   BMI 29.38 kg/m   BP Readings from Last 3 Encounters:  08/17/16 130/68  06/11/16 134/84  06/08/16 (!) 158/112    Wt Readings from Last 3 Encounters:  08/17/16 155 lb 8 oz (70.5 kg)  06/11/16 150 lb (68 kg)  06/08/16 150 lb (68 kg)     Physical Exam  Constitutional: She is oriented to person, place, and time. She appears well-developed and well-nourished. No distress.  HENT:  Head: Normocephalic and atraumatic.  Right Ear: External ear normal.  Left Ear: External ear normal.  Nose: Nose normal.  Mouth/Throat: Oropharynx is clear and moist.  Eyes: Conjunctivae and EOM are normal. Pupils are equal, round, and reactive to light.  Neck: Normal range of motion. Neck supple. No thyromegaly present.  Cardiovascular: Normal rate, regular rhythm and normal heart sounds.   No murmur heard. Pulmonary/Chest: Effort normal and breath sounds normal. No respiratory distress. She has no wheezes. She has no rales.  Abdominal: Soft. Bowel sounds are normal. She exhibits no distension. There is no tenderness.  Lymphadenopathy:    She has no cervical adenopathy.  Neurological: She is alert and oriented to person, place, and time. She has normal reflexes.  Skin: Skin is warm and dry. Rash (vesicular eruption in clusters at antecubital fossa R) noted.  Psychiatric: She has a normal mood and affect. Her behavior is normal. Judgment and thought content normal.     Lab Results  Component Value Date   WBC 6.8 06/11/2016   HGB 12.5 06/08/2016   HCT 37.5  06/11/2016   PLT 240 06/11/2016   GLUCOSE 105 (H) 06/11/2016   CHOL 124 05/25/2016   TRIG 109 05/25/2016   HDL 45 05/25/2016   LDLCALC 57 05/25/2016   ALT 8 05/25/2016   AST 19 05/25/2016   NA 140 06/11/2016   K 3.7 06/11/2016   CL 97 06/11/2016   CREATININE 0.64 06/11/2016   BUN 8 06/11/2016   CO2 28 06/11/2016   TSH 2.939 03/25/2013   INR 2.5 (H) 07/19/2016   HGBA1C  5.8 02/02/2015    Ct Head Wo Contrast  Result Date: 06/08/2016 CLINICAL DATA:  82 year old female status post fall at 0400 hours while trying to get back and head. Struck head on night stand. Left forehead hematoma. On Coumadin. Initial encounter. EXAM: CT HEAD WITHOUT CONTRAST CT CERVICAL SPINE WITHOUT CONTRAST TECHNIQUE: Multidetector CT imaging of the head and cervical spine was performed following the standard protocol without intravenous contrast. Multiplanar CT image reconstructions of the cervical spine were also generated. COMPARISON:  None. Head and cervical spine CT 09/04/2015. FINDINGS: CT HEAD FINDINGS Brain: Stable cerebral volume. Mild subarachnoid CSF is stable along both anterior convexities. No acute intracranial hemorrhage identified. No midline shift, mass effect, or evidence of intracranial mass lesion. Confluent bilateral cerebral white matter hypodensity. Less pronounced heterogeneity in the deep gray matter nuclei. Stable gray-white matter differentiation throughout the brain. No cortically based acute infarct identified. Vascular: Calcified atherosclerosis at the skull base. No suspicious intracranial vascular hyperdensity. Skull: Calvarium intact. Sinuses/Orbits: Stable with mild paranasal sinus mucosal thickening. Tympanic cavities and mastoids are clear. Other: Calcified atherosclerosis at the skull base. Calvarium intact. Left frontal bone intact. Left frontal sinus is clear. Other scalp and visible orbits soft tissues appear within normal limits. CT CERVICAL SPINE FINDINGS Alignment: Mildly improved upper cervical lordosis. Mild anterolisthesis of C4 on C5 is stable. Widespread facet hypertrophy greater on the left. Cervicothoracic junction alignment is within normal limits. Bilateral posterior element alignment is within normal limits. Skull base and vertebrae: Visualized skull base is intact. No atlanto-occipital dissociation. Normal C1-C2 alignment. No cervical spine fracture  identified. Soft tissues and spinal canal: No prevertebral fluid or swelling. No visible canal hematoma. Negative noncontrast neck soft tissues aside from calcified carotid atherosclerosis. Disc levels: Multilevel disc and endplate degeneration. No significant spinal stenosis suspected. Upper chest: Visible upper thoracic levels appear grossly intact. Negative lung apices. Other: IMPRESSION: 1. Forehead scalp hematoma without underlying fracture. 2. Stable non contrast CT appearance of the brain. Advanced white matter changes favored due to chronic small vessel disease. 3. Stable cervical spine. No acute fractureidentified. Ligamentous injury is not excluded. Electronically Signed   By: Genevie Ann M.D.   On: 06/08/2016 11:35   Ct Cervical Spine Wo Contrast  Result Date: 06/08/2016 CLINICAL DATA:  82 year old female status post fall at 0400 hours while trying to get back and head. Struck head on night stand. Left forehead hematoma. On Coumadin. Initial encounter. EXAM: CT HEAD WITHOUT CONTRAST CT CERVICAL SPINE WITHOUT CONTRAST TECHNIQUE: Multidetector CT imaging of the head and cervical spine was performed following the standard protocol without intravenous contrast. Multiplanar CT image reconstructions of the cervical spine were also generated. COMPARISON:  None. Head and cervical spine CT 09/04/2015. FINDINGS: CT HEAD FINDINGS Brain: Stable cerebral volume. Mild subarachnoid CSF is stable along both anterior convexities. No acute intracranial hemorrhage identified. No midline shift, mass effect, or evidence of intracranial mass lesion. Confluent bilateral cerebral white matter hypodensity. Less pronounced heterogeneity in the deep gray matter nuclei.  Stable gray-white matter differentiation throughout the brain. No cortically based acute infarct identified. Vascular: Calcified atherosclerosis at the skull base. No suspicious intracranial vascular hyperdensity. Skull: Calvarium intact. Sinuses/Orbits: Stable with  mild paranasal sinus mucosal thickening. Tympanic cavities and mastoids are clear. Other: Calcified atherosclerosis at the skull base. Calvarium intact. Left frontal bone intact. Left frontal sinus is clear. Other scalp and visible orbits soft tissues appear within normal limits. CT CERVICAL SPINE FINDINGS Alignment: Mildly improved upper cervical lordosis. Mild anterolisthesis of C4 on C5 is stable. Widespread facet hypertrophy greater on the left. Cervicothoracic junction alignment is within normal limits. Bilateral posterior element alignment is within normal limits. Skull base and vertebrae: Visualized skull base is intact. No atlanto-occipital dissociation. Normal C1-C2 alignment. No cervical spine fracture identified. Soft tissues and spinal canal: No prevertebral fluid or swelling. No visible canal hematoma. Negative noncontrast neck soft tissues aside from calcified carotid atherosclerosis. Disc levels: Multilevel disc and endplate degeneration. No significant spinal stenosis suspected. Upper chest: Visible upper thoracic levels appear grossly intact. Negative lung apices. Other: IMPRESSION: 1. Forehead scalp hematoma without underlying fracture. 2. Stable non contrast CT appearance of the brain. Advanced white matter changes favored due to chronic small vessel disease. 3. Stable cervical spine. No acute fractureidentified. Ligamentous injury is not excluded. Electronically Signed   By: Genevie Ann M.D.   On: 06/08/2016 11:35    Assessment & Plan:   Isobel was seen today for arm pain.  Diagnoses and all orders for this visit:  Essential hypertension  Pain, arm, right -     DG Shoulder Right; Future -     DG Elbow 2 Views Right; Future -     DG Wrist Complete Right; Future  Herpes zoster without complication  Other orders -     valACYclovir (VALTREX) 1000 MG tablet; Take 1 tablet (1,000 mg total) by mouth 3 (three) times daily. -     traMADol (ULTRAM) 50 MG tablet; Take 1 tablet (50 mg total)  by mouth 4 (four) times daily as needed for moderate pain.   I am having Ms. Blanch Media start on valACYclovir and traMADol. I am also having her maintain her albuterol, pravastatin, ZETIA, Vitamin D (Ergocalciferol), meloxicam, captopril, atenolol, and warfarin.  Meds ordered this encounter  Medications  . valACYclovir (VALTREX) 1000 MG tablet    Sig: Take 1 tablet (1,000 mg total) by mouth 3 (three) times daily.    Dispense:  21 tablet    Refill:  0  . traMADol (ULTRAM) 50 MG tablet    Sig: Take 1 tablet (50 mg total) by mouth 4 (four) times daily as needed for moderate pain.    Dispense:  60 tablet    Refill:  02     Follow-up: No Follow-up on file.  Claretta Fraise, M.D.

## 2016-08-17 NOTE — Telephone Encounter (Signed)
Pt having R arm pain appt scheduled

## 2016-08-22 ENCOUNTER — Ambulatory Visit (INDEPENDENT_AMBULATORY_CARE_PROVIDER_SITE_OTHER): Payer: Medicare Other | Admitting: Pharmacist

## 2016-08-22 DIAGNOSIS — I4891 Unspecified atrial fibrillation: Secondary | ICD-10-CM | POA: Diagnosis not present

## 2016-08-22 LAB — COAGUCHEK XS/INR WAIVED
INR: 2.1 — AB (ref 0.9–1.1)
PROTHROMBIN TIME: 25.2 s

## 2016-09-07 ENCOUNTER — Encounter (HOSPITAL_COMMUNITY): Payer: Self-pay

## 2016-09-07 ENCOUNTER — Emergency Department (HOSPITAL_COMMUNITY)
Admission: EM | Admit: 2016-09-07 | Discharge: 2016-09-08 | Disposition: A | Discharge status: Home / Self Care | Payer: Medicare Other | Attending: Emergency Medicine | Admitting: Emergency Medicine

## 2016-09-07 DIAGNOSIS — X58XXXA Exposure to other specified factors, initial encounter: Secondary | ICD-10-CM | POA: Diagnosis not present

## 2016-09-07 DIAGNOSIS — N390 Urinary tract infection, site not specified: Secondary | ICD-10-CM | POA: Insufficient documentation

## 2016-09-07 DIAGNOSIS — Y999 Unspecified external cause status: Secondary | ICD-10-CM | POA: Insufficient documentation

## 2016-09-07 DIAGNOSIS — Y9301 Activity, walking, marching and hiking: Secondary | ICD-10-CM | POA: Diagnosis not present

## 2016-09-07 DIAGNOSIS — Z79899 Other long term (current) drug therapy: Secondary | ICD-10-CM | POA: Diagnosis not present

## 2016-09-07 DIAGNOSIS — I10 Essential (primary) hypertension: Secondary | ICD-10-CM | POA: Diagnosis not present

## 2016-09-07 DIAGNOSIS — M545 Low back pain: Secondary | ICD-10-CM | POA: Diagnosis present

## 2016-09-07 DIAGNOSIS — Z7901 Long term (current) use of anticoagulants: Secondary | ICD-10-CM | POA: Insufficient documentation

## 2016-09-07 DIAGNOSIS — K5730 Diverticulosis of large intestine without perforation or abscess without bleeding: Secondary | ICD-10-CM | POA: Diagnosis not present

## 2016-09-07 DIAGNOSIS — S39012A Strain of muscle, fascia and tendon of lower back, initial encounter: Secondary | ICD-10-CM | POA: Insufficient documentation

## 2016-09-07 DIAGNOSIS — Y929 Unspecified place or not applicable: Secondary | ICD-10-CM | POA: Diagnosis not present

## 2016-09-07 LAB — URINALYSIS, ROUTINE W REFLEX MICROSCOPIC
BACTERIA UA: NONE SEEN
Bilirubin Urine: NEGATIVE
GLUCOSE, UA: NEGATIVE mg/dL
Ketones, ur: 5 mg/dL — AB
NITRITE: NEGATIVE
PH: 5 (ref 5.0–8.0)
Protein, ur: 30 mg/dL — AB
SPECIFIC GRAVITY, URINE: 1.023 (ref 1.005–1.030)

## 2016-09-07 NOTE — ED Triage Notes (Signed)
Patient complaining of pain in right lower back that started this evening around 6 pm.  Was walking when her back started hurting, did not report a fall or any other injury.  Denies problems with bladder or bowels.

## 2016-09-08 ENCOUNTER — Emergency Department (HOSPITAL_COMMUNITY): Payer: Medicare Other

## 2016-09-08 DIAGNOSIS — S39012A Strain of muscle, fascia and tendon of lower back, initial encounter: Secondary | ICD-10-CM | POA: Diagnosis not present

## 2016-09-08 DIAGNOSIS — K5730 Diverticulosis of large intestine without perforation or abscess without bleeding: Secondary | ICD-10-CM | POA: Diagnosis not present

## 2016-09-08 MED ORDER — CEPHALEXIN 500 MG PO CAPS: 500.0000 mg | ORAL_CAPSULE | Freq: Four times a day (QID) | ORAL | 0 refills | Status: DC

## 2016-09-08 NOTE — Discharge Instructions (Signed)
Drink plenty of water.  Follow-up with your primary doctor for recheck.  Return to ER for any worsening symptoms

## 2016-09-08 NOTE — ED Notes (Signed)
From CT 

## 2016-09-09 ENCOUNTER — Emergency Department (HOSPITAL_COMMUNITY)
Admission: EM | Admit: 2016-09-09 | Discharge: 2016-09-09 | Disposition: A | Discharge status: Home / Self Care | Payer: Medicare Other | Attending: Emergency Medicine | Admitting: Emergency Medicine

## 2016-09-09 ENCOUNTER — Encounter (HOSPITAL_COMMUNITY): Payer: Self-pay | Admitting: Emergency Medicine

## 2016-09-09 DIAGNOSIS — S3992XA Unspecified injury of lower back, initial encounter: Secondary | ICD-10-CM | POA: Diagnosis present

## 2016-09-09 DIAGNOSIS — I10 Essential (primary) hypertension: Secondary | ICD-10-CM | POA: Insufficient documentation

## 2016-09-09 DIAGNOSIS — Y929 Unspecified place or not applicable: Secondary | ICD-10-CM | POA: Insufficient documentation

## 2016-09-09 DIAGNOSIS — Y999 Unspecified external cause status: Secondary | ICD-10-CM | POA: Diagnosis not present

## 2016-09-09 DIAGNOSIS — X58XXXA Exposure to other specified factors, initial encounter: Secondary | ICD-10-CM | POA: Diagnosis not present

## 2016-09-09 DIAGNOSIS — Z7901 Long term (current) use of anticoagulants: Secondary | ICD-10-CM | POA: Diagnosis not present

## 2016-09-09 DIAGNOSIS — Z791 Long term (current) use of non-steroidal anti-inflammatories (NSAID): Secondary | ICD-10-CM | POA: Insufficient documentation

## 2016-09-09 DIAGNOSIS — Y939 Activity, unspecified: Secondary | ICD-10-CM | POA: Insufficient documentation

## 2016-09-09 DIAGNOSIS — S39012A Strain of muscle, fascia and tendon of lower back, initial encounter: Secondary | ICD-10-CM | POA: Diagnosis not present

## 2016-09-09 LAB — COMPREHENSIVE METABOLIC PANEL
ALBUMIN: 3.7 g/dL (ref 3.5–5.0)
ALK PHOS: 63 U/L (ref 38–126)
ALT: 12 U/L — AB (ref 14–54)
AST: 23 U/L (ref 15–41)
Anion gap: 7 (ref 5–15)
BUN: 10 mg/dL (ref 6–20)
CO2: 30 mmol/L (ref 22–32)
CREATININE: 0.64 mg/dL (ref 0.44–1.00)
Calcium: 9.2 mg/dL (ref 8.9–10.3)
Chloride: 100 mmol/L — ABNORMAL LOW (ref 101–111)
GFR calc Af Amer: >60 mL/min (ref >60)
GFR calc non Af Amer: >60 mL/min (ref >60)
GLUCOSE: 127 mg/dL — AB (ref 65–99)
Potassium: 3.4 mmol/L — ABNORMAL LOW (ref 3.5–5.1)
SODIUM: 137 mmol/L (ref 135–145)
Total Bilirubin: 0.4 mg/dL (ref 0.3–1.2)
Total Protein: 7.5 g/dL (ref 6.5–8.1)

## 2016-09-09 LAB — CBC WITH DIFFERENTIAL/PLATELET
BASOS PCT: 0 %
Basophils Absolute: 0 10*3/uL (ref 0.0–0.1)
Eosinophils Absolute: 0.1 10*3/uL (ref 0.0–0.7)
Eosinophils Relative: 2 %
HCT: 40.1 % (ref 36.0–46.0)
HEMOGLOBIN: 12.4 g/dL (ref 12.0–15.0)
LYMPHS ABS: 1.4 10*3/uL (ref 0.7–4.0)
Lymphocytes Relative: 19 %
MCH: 25.7 pg — AB (ref 26.0–34.0)
MCHC: 30.9 g/dL (ref 30.0–36.0)
MCV: 83 fL (ref 78.0–100.0)
Monocytes Absolute: 0.6 10*3/uL (ref 0.1–1.0)
Monocytes Relative: 9 %
NEUTROS ABS: 5.1 10*3/uL (ref 1.7–7.7)
NEUTROS PCT: 70 %
Platelets: 287 10*3/uL (ref 150–400)
RBC: 4.83 MIL/uL (ref 3.87–5.11)
RDW: 17.2 % — ABNORMAL HIGH (ref 11.5–15.5)
WBC: 7.2 10*3/uL (ref 4.0–10.5)

## 2016-09-09 LAB — URINALYSIS, ROUTINE W REFLEX MICROSCOPIC
BILIRUBIN URINE: NEGATIVE
Glucose, UA: NEGATIVE mg/dL
Ketones, ur: NEGATIVE mg/dL
NITRITE: NEGATIVE
Protein, ur: NEGATIVE mg/dL
SPECIFIC GRAVITY, URINE: 1.014 (ref 1.005–1.030)
pH: 6 (ref 5.0–8.0)

## 2016-09-09 LAB — URINE CULTURE: Culture: NO GROWTH

## 2016-09-09 MED ORDER — HYDROCODONE-ACETAMINOPHEN 5-325 MG PO TABS: 1.0000 | ORAL_TABLET | Freq: Four times a day (QID) | ORAL | 0 refills | Status: DC | PRN

## 2016-09-09 MED ORDER — ONDANSETRON HCL 4 MG/2ML IJ SOLN
4.0000 mg | Freq: Once | INTRAMUSCULAR | Status: AC
  Administered 2016-09-09: 4 mg via INTRAVENOUS
  Filled 2016-09-09: qty 2

## 2016-09-09 MED ORDER — HYDROMORPHONE HCL 1 MG/ML IJ SOLN
0.5000 mg | Freq: Once | INTRAMUSCULAR | Status: AC
  Administered 2016-09-09: 0.5 mg via INTRAVENOUS
  Filled 2016-09-09: qty 1

## 2016-09-09 NOTE — ED Triage Notes (Signed)
Pt states that she was here Friday night with pain in lower back related to her kidneys.  She is still having pain, she is urinating normally.  She states that the pain got better, but has now gotten worse.

## 2016-09-09 NOTE — Discharge Instructions (Signed)
Follow up with your md this week for recheck  °

## 2016-09-09 NOTE — ED Provider Notes (Signed)
[facility] DEPT Provider Note   CSN: [id number] Arrival date & time: 09/09/16  1622     History   Chief Complaint Chief Complaint  Patient presents with  . Back Pain    HPI Kari [name] is a 83 y.o. female.  Patient complains of right-sided lower back pain. She was diagnosed with UTI yesterday and had a CT scan of her abdomen that was unremarkable. She's been taken tramadol without relief.   The history is provided by the patient. No language interpreter was used.  Back Pain   This is a recurrent problem. The current episode started 2 days ago. The problem occurs constantly. The problem has not changed since onset.The pain is associated with no known injury. The pain is present in the lumbar spine. The quality of the pain is described as aching. Pertinent negatives include no chest pain, no headaches and no abdominal pain.    Past Medical History:  Diagnosis Date  . Adenomatous polyps   . AF (atrial fibrillation) ([place])   . Cataract   . Gallstones   . Hx of adenomatous colonic polyps   . Hyperlipidemia    x5 years  . Hypertension    x5 years  . Kyphosis   . Metabolic syndrome X   . Obesity   . Osteoporosis   . Symptomatic menopausal or female climacteric states   . Vitamin D deficiency     Patient Active Problem List   Diagnosis Date Noted  . Sciatica of right side 04/23/2016  . At high risk for falls 04/23/2016  . Increased BMI 09/30/2015  . Hyperlipidemia 09/07/2015  . Low back pain 09/07/2015  . Hypoxia 09/06/2015  . Orthostatic syncope 09/04/2015  . Orthostatic hypotension 09/04/2015  . Thoracic aortic atherosclerosis ([place]) 05/03/2015  . Vitamin D deficiency 02/02/2015  . Pre-diabetes 02/02/2015  . Metabolic syndrome [id number]  . Osteoporosis 03/11/2013  . Atrial fibrillation, unspecified 11/24/2012  . Hypokalemia 05/04/2012  . Obesity   . Symptomatic menopausal or female climacteric states   . Kyphosis   . Gallstones   . Personal history of  adenomatous colonic polyps   . Essential hypertension 03/29/2009    Past Surgical History:  Procedure Laterality Date  . ABDOMINAL HYSTERECTOMY    . BLADDER SUSPENSION    . COLONOSCOPY  multiple  . EYE SURGERY Bilateral 09/2015  . VAGINAL HYSTERECTOMY     total    OB History    No data available       Home Medications    Prior to Admission medications   Medication Sig Start Date End Date Taking? Authorizing Provider  atenolol (TENORMIN) 50 MG tablet TAKE (1) TABLET TWICE A DAY. 06/06/16  Yes Chipper Herb, MD  captopril (CAPOTEN) 50 MG tablet TAKE (1) TABLET TWICE A DAY FOR HIGH BLOOD PRESSURE. 04/26/16  Yes Chipper Herb, MD  cephALEXin (KEFLEX) 500 MG capsule Take 1 capsule (500 mg total) by mouth 4 (four) times daily. For 7 days 09/08/16  Yes Tammy Triplett, PA-C  meloxicam (MOBIC) 15 MG tablet Take 1 tablet (15 mg total) by mouth daily. 03/22/16  Yes Mary-Margaret Hassell Done, FNP  pravastatin (PRAVACHOL) 80 MG tablet TAKE 1 TABLET ONCE DAILY FOR CHOLESTEROL 01/10/16  Yes Chipper Herb, MD  traMADol (ULTRAM) 50 MG tablet Take 1 tablet (50 mg total) by mouth 4 (four) times daily as needed for moderate pain. 08/17/16  Yes Claretta Fraise, MD  Vitamin D, Ergocalciferol, (DRISDOL) 50000 units CAPS capsule Take 1 capsule (50,000  Units total) by mouth every 7 (seven) days. Patient taking differently: Take 50,000 Units by mouth every 7 (seven) days. Take on Monday's. 01/16/16  Yes Chipper Herb, MD  warfarin (COUMADIN) 2.5 MG tablet TAKE 1 TABLET ONCE DAILY OR AS DIRECTED Patient taking differently: TAKE 1 TABLET ONCE DAILY 06/14/16  Yes Chipper Herb, MD  ZETIA 10 MG tablet TAKE 1 TABLET ONCE DAILY FOR CHOLESTEROL 01/10/16  Yes Chipper Herb, MD  HYDROcodone-acetaminophen (NORCO/VICODIN) 5-325 MG tablet Take 1 tablet by mouth every 6 (six) hours as needed. 09/09/16   Milton Ferguson, MD  valACYclovir (VALTREX) 1000 MG tablet Take 1 tablet (1,000 mg total) by mouth 3 (three) times daily. Patient  not taking: Reported on 09/07/2016 08/17/16   Claretta Fraise, MD    Family History Family History  Problem Relation Age of Onset  . Heart attack Mother   . Stroke Mother   . Heart attack Father   . Alcohol abuse Father   . Stroke Brother   . Heart attack Brother   . Depression Brother     suicide  . Heart attack Other   . Stroke Other   . Breast cancer Other   . Colon cancer Neg Hx     Social History Social History  Substance Use Topics  . Smoking status: Never Smoker  . Smokeless tobacco: Never Used  . Alcohol use No     Allergies   Erythromycin and Penicillins   Review of Systems Review of Systems  Constitutional: Negative for appetite change and fatigue.  HENT: Negative for congestion, ear discharge and sinus pressure.   Eyes: Negative for discharge.  Respiratory: Negative for cough.   Cardiovascular: Negative for chest pain.  Gastrointestinal: Negative for abdominal pain and diarrhea.  Genitourinary: Negative for frequency and hematuria.  Musculoskeletal: Positive for back pain.  Skin: Negative for rash.  Neurological: Negative for seizures and headaches.  Psychiatric/Behavioral: Negative for hallucinations.     Physical Exam Updated Vital Signs BP 166/75 (BP Location: Left Arm)   Pulse (!) 52   Temp 98.1 F (36.7 C) (Oral)   Resp 18   Ht 5\' 1"  (1.549 m)   Wt 150 lb (68 kg)   SpO2 97%   BMI 28.34 kg/m   Physical Exam  Constitutional: She is oriented to person, place, and time. She appears well-developed.  HENT:  Head: Normocephalic.  Eyes: Conjunctivae and EOM are normal. No scleral icterus.  Neck: Neck supple. No thyromegaly present.  Cardiovascular: Normal rate and regular rhythm.  Exam reveals no gallop and no friction rub.   No murmur heard. Pulmonary/Chest: No stridor. She has no wheezes. She has no rales. She exhibits no tenderness.  Abdominal: She exhibits no distension. There is no tenderness. There is no rebound.  Musculoskeletal:  Normal range of motion. She exhibits no edema.  Patient has a right flank area  Lymphadenopathy:    She has no cervical adenopathy.  Neurological: She is oriented to person, place, and time. She exhibits normal muscle tone. Coordination normal.  Skin: No rash noted. No erythema.  Psychiatric: She has a normal mood and affect. Her behavior is normal.     ED Treatments / Results  Labs (all labs ordered are listed, but only abnormal results are displayed) Labs Reviewed  URINALYSIS, ROUTINE W REFLEX MICROSCOPIC - Abnormal; Notable for the following:       Result Value   APPearance HAZY (*)    Hgb urine dipstick SMALL (*)  Leukocytes, UA MODERATE (*)    Bacteria, UA RARE (*)    Non Squamous Epithelial 0-5 (*)    All other components within normal limits  CBC WITH DIFFERENTIAL/PLATELET - Abnormal; Notable for the following:    MCH 25.7 (*)    RDW 17.2 (*)    All other components within normal limits  COMPREHENSIVE METABOLIC PANEL - Abnormal; Notable for the following:    Potassium 3.4 (*)    Chloride 100 (*)    Glucose, Bld 127 (*)    ALT 12 (*)    All other components within normal limits    EKG  EKG Interpretation None       Radiology Ct Renal Stone Study  Result Date: 09/08/2016 CLINICAL DATA:  83 y/o  F; pain in the right lower back. EXAM: CT ABDOMEN AND PELVIS WITHOUT CONTRAST TECHNIQUE: Multidetector CT imaging of the abdomen and pelvis was performed following the standard protocol without IV contrast. COMPARISON:  10/21/2013 CT of the abdomen and pelvis. FINDINGS: Lower chest: 8 mm subpleural right lower lobe pulmonary nodule (series 6, image 7). Moderate coronary artery calcification and mitral annular calcification. Normal heart size. Hepatobiliary: Stable lucencies in segment 2 and segment 1 of the liver compatible with cysts. Gallstones. No secondary signs of cholecystitis. No intra or extrahepatic biliary ductal dilatation. Pancreas: Unremarkable. No pancreatic  ductal dilatation or surrounding inflammatory changes. Spleen: Normal in size without focal abnormality. Adrenals/Urinary Tract: Punctate left kidney upper pole stone (series 3, image 65). No hydronephrosis or obstructive uropathy. Cortical scarring in the left kidney interpolar region posteriorly. Normal bladder. Stomach/Bowel: Stomach is within normal limits. Appendix appears normal. No evidence of bowel wall thickening, distention, or inflammatory changes. Sigmoid diverticulosis without evidence for diverticulitis. Vascular/Lymphatic: Aortic atherosclerosis with severe concentric calcification extending into bilateral iliofemoral arteries. No enlarged abdominal or pelvic lymph nodes. Reproductive: Status post hysterectomy. No adnexal masses. Other:  No ascites. Musculoskeletal: Osseous protuberance arising from the left greater trochanter of proximal femur with stable from prior CT and may represent heterotopic ossification as sequelae of old trauma or enchondroma. No soft tissue component identified. Discogenic and facet degenerative changes most pronounced in the lower lumbar spine. Grade 1 L4-5 anterolisthesis and mild S-shaped curvature of the lumbar spine. No acute osseous abnormality is evident. IMPRESSION: 1. No acute process identified as explanation for pain. 2. New right lower lobe 8 mm pulmonary nodule. Non-contrast chest CT at 6-12 months is recommended. If the nodule is stable at time of repeat CT, then future CT at 18-24 months (from today's scan) is considered optional for low-risk patients, but is recommended for high-risk patients. This recommendation follows the consensus statement: Guidelines for Management of Incidental Pulmonary Nodules Detected on CT Images: From the Fleischner Society 2017; Radiology 2017; 284:228-243. 3. Punctate left kidney upper pole nonobstructing stone. No hydronephrosis. 4. Gallstones. 5. Sigmoid diverticulosis. 6. Aortic atherosclerosis. Electronically Signed   By:  Kristine Garbe M.D.   On: 09/08/2016 01:34    Procedures Procedures (including critical care time)  Medications Ordered in ED Medications  HYDROmorphone (DILAUDID) injection 0.5 mg (0.5 mg Intravenous Given 09/09/16 1715)  ondansetron (ZOFRAN) injection 4 mg (4 mg Intravenous Given 09/09/16 1715)     Initial Impression / Assessment and Plan / ED Course  I have reviewed the triage vital signs and the nursing notes.  Pertinent labs & imaging results that were available during my care of the patient were reviewed by me and considered in my medical decision making (see chart  for details).  Clinical Course     Patient with back pain urinary tract infection and normal CT renal scan yesterday. Patient improved with Milligram of Dilaudid. She had been taking Ultram at home. Labs unremarkable patient will be put on hydrocodone and will follow-up with her PCP  Final Clinical Impressions(s) / ED Diagnoses   Final diagnoses:  Strain of lumbar region, initial encounter    New Prescriptions New Prescriptions   HYDROCODONE-ACETAMINOPHEN (NORCO/VICODIN) 5-325 MG TABLET    Take 1 tablet by mouth every 6 (six) hours as needed.     Milton Ferguson, MD 09/09/16 [phone]

## 2016-09-11 NOTE — ED Provider Notes (Signed)
[facility] DEPT Provider Note   CSN: [id number] Arrival date & time: 09/07/16  2056     History   Chief Complaint Chief Complaint  Patient presents with  . Back Pain    HPI Kari [name] is a 83 y.o. female.  HPI   Kari [name] is a 83 y.o. female who presents to the Emergency Department complaining of right lower back pain. Pain began several hours prior to arrival.  She describes an aching pain to her right lower back that is associated with movement, improves with rest.  Onset of symptoms began while walking.  She denies fall, rash, abdominal pain, urine or bowel changes, pain, numbness or weakness of the LE's.    Past Medical History:  Diagnosis Date  . Adenomatous polyps   . AF (atrial fibrillation) ([place])   . Cataract   . Gallstones   . Hx of adenomatous colonic polyps   . Hyperlipidemia    x5 years  . Hypertension    x5 years  . Kyphosis   . Metabolic syndrome X   . Obesity   . Osteoporosis   . Symptomatic menopausal or female climacteric states   . Vitamin D deficiency     Patient Active Problem List   Diagnosis Date Noted  . Sciatica of right side 04/23/2016  . At high risk for falls 04/23/2016  . Increased BMI 09/30/2015  . Hyperlipidemia 09/07/2015  . Low back pain 09/07/2015  . Hypoxia 09/06/2015  . Orthostatic syncope 09/04/2015  . Orthostatic hypotension 09/04/2015  . Thoracic aortic atherosclerosis ([place]) 05/03/2015  . Vitamin D deficiency 02/02/2015  . Pre-diabetes 02/02/2015  . Metabolic syndrome [id number]  . Osteoporosis 03/11/2013  . Atrial fibrillation, unspecified 11/24/2012  . Hypokalemia 05/04/2012  . Obesity   . Symptomatic menopausal or female climacteric states   . Kyphosis   . Gallstones   . Personal history of adenomatous colonic polyps   . Essential hypertension 03/29/2009    Past Surgical History:  Procedure Laterality Date  . ABDOMINAL HYSTERECTOMY    . BLADDER SUSPENSION    . COLONOSCOPY  multiple  . EYE  SURGERY Bilateral 09/2015  . VAGINAL HYSTERECTOMY     total    OB History    No data available       Home Medications    Prior to Admission medications   Medication Sig Start Date End Date Taking? Authorizing Provider  atenolol (TENORMIN) 50 MG tablet TAKE (1) TABLET TWICE A DAY. 06/06/16  Yes Chipper Herb, MD  captopril (CAPOTEN) 50 MG tablet TAKE (1) TABLET TWICE A DAY FOR HIGH BLOOD PRESSURE. 04/26/16  Yes Chipper Herb, MD  pravastatin (PRAVACHOL) 80 MG tablet TAKE 1 TABLET ONCE DAILY FOR CHOLESTEROL 01/10/16  Yes Chipper Herb, MD  traMADol (ULTRAM) 50 MG tablet Take 1 tablet (50 mg total) by mouth 4 (four) times daily as needed for moderate pain. 08/17/16  Yes Claretta Fraise, MD  Vitamin D, Ergocalciferol, (DRISDOL) 50000 units CAPS capsule Take 1 capsule (50,000 Units total) by mouth every 7 (seven) days. Patient taking differently: Take 50,000 Units by mouth every 7 (seven) days. Take on Monday's. 01/16/16  Yes Chipper Herb, MD  warfarin (COUMADIN) 2.5 MG tablet TAKE 1 TABLET ONCE DAILY OR AS DIRECTED Patient taking differently: TAKE 1 TABLET ONCE DAILY 06/14/16  Yes Chipper Herb, MD  ZETIA 10 MG tablet TAKE 1 TABLET ONCE DAILY FOR CHOLESTEROL 01/10/16  Yes Chipper Herb, MD  cephALEXin [facility]) 500  MG capsule Take 1 capsule (500 mg total) by mouth 4 (four) times daily. For 7 days 09/08/16   Iriana Artley, PA-C  HYDROcodone-acetaminophen (NORCO/VICODIN) 5-325 MG tablet Take 1 tablet by mouth every 6 (six) hours as needed. 09/09/16   Milton Ferguson, MD  meloxicam (MOBIC) 15 MG tablet Take 1 tablet (15 mg total) by mouth daily. 03/22/16   Mary-Margaret Hassell Done, FNP  valACYclovir (VALTREX) 1000 MG tablet Take 1 tablet (1,000 mg total) by mouth 3 (three) times daily. Patient not taking: Reported on 09/07/2016 08/17/16   Claretta Fraise, MD    Family History Family History  Problem Relation Age of Onset  . Heart attack Mother   . Stroke Mother   . Heart attack Father   . Alcohol abuse  Father   . Stroke Brother   . Heart attack Brother   . Depression Brother     suicide  . Heart attack Other   . Stroke Other   . Breast cancer Other   . Colon cancer Neg Hx     Social History Social History  Substance Use Topics  . Smoking status: Never Smoker  . Smokeless tobacco: Never Used  . Alcohol use No     Allergies   Erythromycin and Penicillins   Review of Systems Review of Systems  Constitutional: Negative for fever.  Respiratory: Negative for shortness of breath.   Gastrointestinal: Negative for abdominal pain, constipation and vomiting.  Genitourinary: Negative for decreased urine volume, difficulty urinating, dysuria, flank pain and hematuria.  Musculoskeletal: Positive for back pain. Negative for joint swelling and neck pain.  Skin: Negative for rash.  Neurological: Negative for dizziness, weakness and numbness.  All other systems reviewed and are negative.    Physical Exam Updated Vital Signs BP 147/88 (BP Location: Right Arm)   Pulse 78   Temp 97.8 F (36.6 C) (Oral)   Resp 15   Ht 5\' 1"  (1.549 m)   Wt 68 kg   SpO2 96%   BMI 28.34 kg/m   Physical Exam  Constitutional: She is oriented to person, place, and time. She appears well-developed and well-nourished. No distress.  HENT:  Head: Normocephalic and atraumatic.  Neck: Normal range of motion. Neck supple.  Cardiovascular: Normal rate, regular rhythm, normal heart sounds and intact distal pulses.   No murmur heard. Pulmonary/Chest: Effort normal and breath sounds normal. No respiratory distress.  Abdominal: Soft. She exhibits no distension. There is no tenderness.  Musculoskeletal: She exhibits tenderness. She exhibits no edema.       Lumbar back: She exhibits tenderness and pain. She exhibits normal range of motion, no swelling, no deformity, no laceration and normal pulse.  ttp of the right lower lumbar paraspinal muscles.  No spinal tenderness.  DP pulses are brisk and symmetrical.   Distal sensation intact.  Pt has 5/5 strength against resistance of bilateral lower extremities.     Neurological: She is alert and oriented to person, place, and time. She has normal strength. No sensory deficit. She exhibits normal muscle tone. Coordination and gait normal.  Reflex Scores:      Patellar reflexes are 2+ on the right side and 2+ on the left side.      Achilles reflexes are 2+ on the right side and 2+ on the left side. Skin: Skin is warm and dry. No rash noted.  Nursing note and vitals reviewed.    ED Treatments / Results  Labs (all labs ordered are listed, but only abnormal results are displayed)  Labs Reviewed  URINALYSIS, ROUTINE W REFLEX MICROSCOPIC - Abnormal; Notable for the following:       Result Value   Hgb urine dipstick SMALL (*)    Ketones, ur 5 (*)    Protein, ur 30 (*)    Leukocytes, UA TRACE (*)    All other components within normal limits  URINE CULTURE    EKG  EKG Interpretation None       Radiology  Ct Renal Stone Study  Result Date: 09/08/2016 CLINICAL DATA:  83 y/o  F; pain in the right lower back. EXAM: CT ABDOMEN AND PELVIS WITHOUT CONTRAST TECHNIQUE: Multidetector CT imaging of the abdomen and pelvis was performed following the standard protocol without IV contrast. COMPARISON:  10/21/2013 CT of the abdomen and pelvis. FINDINGS: Lower chest: 8 mm subpleural right lower lobe pulmonary nodule (series 6, image 7). Moderate coronary artery calcification and mitral annular calcification. Normal heart size. Hepatobiliary: Stable lucencies in segment 2 and segment 1 of the liver compatible with cysts. Gallstones. No secondary signs of cholecystitis. No intra or extrahepatic biliary ductal dilatation. Pancreas: Unremarkable. No pancreatic ductal dilatation or surrounding inflammatory changes. Spleen: Normal in size without focal abnormality. Adrenals/Urinary Tract: Punctate left kidney upper pole stone (series 3, image 65). No hydronephrosis or  obstructive uropathy. Cortical scarring in the left kidney interpolar region posteriorly. Normal bladder. Stomach/Bowel: Stomach is within normal limits. Appendix appears normal. No evidence of bowel wall thickening, distention, or inflammatory changes. Sigmoid diverticulosis without evidence for diverticulitis. Vascular/Lymphatic: Aortic atherosclerosis with severe concentric calcification extending into bilateral iliofemoral arteries. No enlarged abdominal or pelvic lymph nodes. Reproductive: Status post hysterectomy. No adnexal masses. Other:  No ascites. Musculoskeletal: Osseous protuberance arising from the left greater trochanter of proximal femur with stable from prior CT and may represent heterotopic ossification as sequelae of old trauma or enchondroma. No soft tissue component identified. Discogenic and facet degenerative changes most pronounced in the lower lumbar spine. Grade 1 L4-5 anterolisthesis and mild S-shaped curvature of the lumbar spine. No acute osseous abnormality is evident. IMPRESSION: 1. No acute process identified as explanation for pain. 2. New right lower lobe 8 mm pulmonary nodule. Non-contrast chest CT at 6-12 months is recommended. If the nodule is stable at time of repeat CT, then future CT at 18-24 months (from today's scan) is considered optional for low-risk patients, but is recommended for high-risk patients. This recommendation follows the consensus statement: Guidelines for Management of Incidental Pulmonary Nodules Detected on CT Images: From the Fleischner Society 2017; Radiology 2017; 284:228-243. 3. Punctate left kidney upper pole nonobstructing stone. No hydronephrosis. 4. Gallstones. 5. Sigmoid diverticulosis. 6. Aortic atherosclerosis. Electronically Signed   By: Kristine Garbe M.D.   On: 09/08/2016 01:34     Procedures Procedures (including critical care time)  Medications Ordered in ED Medications - No data to display   Initial Impression /  Assessment and Plan / ED Course  I have reviewed the triage vital signs and the nursing notes.  Pertinent labs & imaging results that were available during my care of the patient were reviewed by me and considered in my medical decision making (see chart for details).  Clinical Course     Pt is well appearing.  Vitals stable.  abd is soft and NT.  U/a indicates possible UTI.  CT scan of abd and pelvis w/o evidence of acute process. Doubt pyelo, dissection, Pt ambulates with a steady gait.  No focal neuro deficits.  abd remains soft and NT.  Pt also seen and care plan discussed with Dr. Roderic Palau.  Pt has tramadol at home.  Will tx keflex, urine culture pending.  Return precautions given.,   Final Clinical Impressions(s) / ED Diagnoses   Final diagnoses:  Strain of lumbar region, initial encounter  Urinary tract infection in elderly patient    New Prescriptions Discharge Medication List as of 09/08/2016  1:53 AM    START taking these medications   Details  cephALEXin (KEFLEX) 500 MG capsule Take 1 capsule (500 mg total) by mouth 4 (four) times daily. For 7 days, Starting Sat 09/08/2016, Print         Kenetra Hildenbrand Geiger, [place] 09/11/16 1324    Milton Ferguson, MD 09/14/16 [phone]

## 2016-09-12 ENCOUNTER — Ambulatory Visit (INDEPENDENT_AMBULATORY_CARE_PROVIDER_SITE_OTHER): Payer: Medicare Other | Admitting: Family Medicine

## 2016-09-12 ENCOUNTER — Encounter: Payer: Self-pay | Admitting: Family Medicine

## 2016-09-12 VITALS — BP 144/84 | HR 85 | Temp 97.9°F | Ht 61.0 in | Wt 151.0 lb

## 2016-09-12 DIAGNOSIS — I700 Atherosclerosis of aorta: Secondary | ICD-10-CM | POA: Diagnosis not present

## 2016-09-12 DIAGNOSIS — I10 Essential (primary) hypertension: Secondary | ICD-10-CM | POA: Diagnosis not present

## 2016-09-12 DIAGNOSIS — M5440 Lumbago with sciatica, unspecified side: Secondary | ICD-10-CM

## 2016-09-12 DIAGNOSIS — N3001 Acute cystitis with hematuria: Secondary | ICD-10-CM | POA: Diagnosis not present

## 2016-09-12 DIAGNOSIS — B029 Zoster without complications: Secondary | ICD-10-CM

## 2016-09-12 DIAGNOSIS — N200 Calculus of kidney: Secondary | ICD-10-CM | POA: Diagnosis not present

## 2016-09-12 DIAGNOSIS — R911 Solitary pulmonary nodule: Secondary | ICD-10-CM | POA: Diagnosis not present

## 2016-09-12 NOTE — Patient Instructions (Signed)
Continue to drink plenty of fluids and stay well hydrated The patient will need a repeat CT scan of the lungs in 6 months because of a basilar pulmonary nodule. If the back pain continues or recurs she may also need to have an MRI of the low back. She should take and complete the antibiotic that she is been prescribed and we should check a urine specimen after this is completed.

## 2016-09-12 NOTE — Progress Notes (Signed)
Subjective:    Patient ID: Kari [name], female    DOB: [birth year], 83 y.o.   MRN: [id number]  HPI Patient here today for hospital follow up from [facility] last week. The patient had 2 visits to the emergency room one on Friday and one on Sunday. This is because of low back pain. She was started on Keflex for urinary tract infection and did have a CT scan renal protocol and this was shown to reveal a left kidney upper pole nonobstructing stone. CT scan nausea associated some gallstones and aortic atherosclerosis. There was no finding for the pain. Something new that was found was a pulmonary nodule and they're requesting a follow-up CT scan in 6 months for stability. The patient comes to the visit today with her son. She has never really had any urinary tract symptoms. The patient is feeling better regarding her back pain. She comes to the visit today with her son Luciana Axe. All of the x-ray results were reviewed and future plans were reviewed with patient and her son. The patient says her back pain is much better.   Patient Active Problem List   Diagnosis Date Noted  . Sciatica of right side 04/23/2016  . At high risk for falls 04/23/2016  . Increased BMI 09/30/2015  . Hyperlipidemia 09/07/2015  . Low back pain 09/07/2015  . Hypoxia 09/06/2015  . Orthostatic syncope 09/04/2015  . Orthostatic hypotension 09/04/2015  . Thoracic aortic atherosclerosis ([place]) 05/03/2015  . Vitamin D deficiency 02/02/2015  . Pre-diabetes 02/02/2015  . Metabolic syndrome [id number]  . Osteoporosis 03/11/2013  . Atrial fibrillation, unspecified 11/24/2012  . Hypokalemia 05/04/2012  . Obesity   . Symptomatic menopausal or female climacteric states   . Kyphosis   . Gallstones   . Personal history of adenomatous colonic polyps   . Essential hypertension 03/29/2009   Outpatient Encounter Prescriptions as of 09/12/2016  Medication Sig  . atenolol (TENORMIN) 50 MG tablet TAKE (1) TABLET TWICE A DAY.  . captopril  (CAPOTEN) 50 MG tablet TAKE (1) TABLET TWICE A DAY FOR HIGH BLOOD PRESSURE.  . cephALEXin (KEFLEX) 500 MG capsule Take 1 capsule (500 mg total) by mouth 4 (four) times daily. For 7 days  . HYDROcodone-acetaminophen (NORCO/VICODIN) 5-325 MG tablet Take 1 tablet by mouth every 6 (six) hours as needed.  . meloxicam (MOBIC) 15 MG tablet Take 1 tablet (15 mg total) by mouth daily.  . pravastatin (PRAVACHOL) 80 MG tablet TAKE 1 TABLET ONCE DAILY FOR CHOLESTEROL  . traMADol (ULTRAM) 50 MG tablet Take 1 tablet (50 mg total) by mouth 4 (four) times daily as needed for moderate pain.  . valACYclovir (VALTREX) 1000 MG tablet Take 1 tablet (1,000 mg total) by mouth 3 (three) times daily.  . Vitamin D, Ergocalciferol, (DRISDOL) 50000 units CAPS capsule Take 1 capsule (50,000 Units total) by mouth every 7 (seven) days. (Patient taking differently: Take 50,000 Units by mouth every 7 (seven) days. Take on Monday's.)  . warfarin (COUMADIN) 2.5 MG tablet TAKE 1 TABLET ONCE DAILY OR AS DIRECTED (Patient taking differently: TAKE 1 TABLET ONCE DAILY)  . ZETIA 10 MG tablet TAKE 1 TABLET ONCE DAILY FOR CHOLESTEROL   No facility-administered encounter medications on file as of 09/12/2016.       Review of Systems  Constitutional: Negative.   HENT: Negative.   Eyes: Negative.   Respiratory: Negative.   Cardiovascular: Negative.   Gastrointestinal: Negative.   Endocrine: Negative.   Genitourinary: Negative.   Musculoskeletal: Negative.  Skin: Negative.   Allergic/Immunologic: Negative.   Neurological: Negative.   Hematological: Negative.   Psychiatric/Behavioral: Negative.        Objective:   Physical Exam  Constitutional: She is oriented to person, place, and time. She appears well-developed and well-nourished. No distress.  HENT:  Head: Normocephalic and atraumatic.  Eyes: Conjunctivae and EOM are normal. Pupils are equal, round, and reactive to light. Right eye exhibits no discharge. Left eye exhibits  no discharge. No scleral icterus.  Neck: Normal range of motion.  Abdominal: Soft. She exhibits no distension. There is no tenderness. There is no rebound and no guarding.  Musculoskeletal: Normal range of motion.  Neurological: She is alert and oriented to person, place, and time.  Skin: Skin is warm and dry. No rash noted.  The shingles rash to the right arm is healing.  Psychiatric: She has a normal mood and affect. Her behavior is normal. Judgment and thought content normal.  Nursing note and vitals reviewed.  BP (!) 149/91 (BP Location: Left Arm)   Pulse 85   Temp 97.9 F (36.6 C) (Oral)   Ht 5\' 1"  (1.549 m)   Wt 151 lb (68.5 kg)   BMI 28.53 kg/m         Assessment & Plan:  1. Acute right-sided low back pain with sciatica, sciatica laterality unspecified -This has improved after 2 visits to the emergency room and a CT scan of her abdomen and back. She does have degenerative arthritis and a nonobstructing left renal stone.  2. Nephrolithiasis -She has a nonobstructing left renal stone.  3. Herpes zoster without complication -The shingles infection to her right arm appears to be resolving  4. Essential hypertension -The blood pressure remains elevated even after being rechecked and was still 144/84 in the right arm sitting. No change in treatment.  5. Acute cystitis with hematuria -The patient will continue and complete the antibiotic of Keflex and will have a urine specimen rechecked when this is completed.  6. Thoracic aortic atherosclerosis ([place]) -Continue with aggressive therapeutic lifestyle changes and current statin drug  7. Pulmonary nodule --Follow-up with repeat CT scan in 6 months as recommended by radiology  Patient Instructions  Continue to drink plenty of fluids and stay well hydrated The patient will need a repeat CT scan of the lungs in 6 months because of a basilar pulmonary nodule. If the back pain continues or recurs she may also need to have an  MRI of the low back. She should take and complete the antibiotic that she is been prescribed and we should check a urine specimen after this is completed.  Arrie Senate MD

## 2016-09-16 ENCOUNTER — Emergency Department (HOSPITAL_COMMUNITY)
Admission: EM | Admit: 2016-09-16 | Discharge: 2016-09-17 | Disposition: A | Discharge status: Home / Self Care | Payer: Medicare Other | Attending: Emergency Medicine | Admitting: Emergency Medicine

## 2016-09-16 ENCOUNTER — Encounter (HOSPITAL_COMMUNITY): Payer: Self-pay | Admitting: Emergency Medicine

## 2016-09-16 DIAGNOSIS — K5900 Constipation, unspecified: Secondary | ICD-10-CM | POA: Diagnosis not present

## 2016-09-16 DIAGNOSIS — M545 Low back pain, unspecified: Secondary | ICD-10-CM

## 2016-09-16 DIAGNOSIS — Z7901 Long term (current) use of anticoagulants: Secondary | ICD-10-CM | POA: Insufficient documentation

## 2016-09-16 DIAGNOSIS — M62830 Muscle spasm of back: Secondary | ICD-10-CM | POA: Diagnosis not present

## 2016-09-16 DIAGNOSIS — I10 Essential (primary) hypertension: Secondary | ICD-10-CM | POA: Insufficient documentation

## 2016-09-16 DIAGNOSIS — Z79899 Other long term (current) drug therapy: Secondary | ICD-10-CM | POA: Insufficient documentation

## 2016-09-16 DIAGNOSIS — Z791 Long term (current) use of non-steroidal anti-inflammatories (NSAID): Secondary | ICD-10-CM | POA: Diagnosis not present

## 2016-09-16 NOTE — ED Notes (Signed)
Pt with back pain as before when seen 7 days ago.  Took hydrocodone an hour ago without any relief.  Reported that pt finished her antibiotic today for UTI.  Denies any burning on urination.

## 2016-09-16 NOTE — ED Triage Notes (Signed)
Pt states she started having lower back pain again around 2000.  Denies any changes to urination

## 2016-09-16 NOTE — ED Provider Notes (Signed)
[facility] DEPT Provider Note   CSN: [id number] Arrival date & time: 09/16/16  2235  By signing my name below, I, Jeanell Sparrow, attest that this documentation has been prepared under the direction and in the presence of Merrily Pew, MD . Electronically Signed: Jeanell Sparrow, Scribe. 09/16/2016. 12:03 AM.  History   Chief Complaint Chief Complaint  Patient presents with  . Back Pain   The history is provided by the patient and medical records. No language interpreter was used.   HPI Comments: Kari [name] is a 83 y.o. female who presents to the Emergency Department complaining of constant moderate right-sided lower back pain that started about 4 hours ago. Per medical record, this is her 4th ED visit in the past 10 days. She was seen for the same complaint a week ago. She completed her antibiotic course today for a UTI. She took hydrocodone PTA with minimal relief. She describes the pain as non-radiating. She has had associated constipation for the past 3 days. She ambulates with a walker at baseline. She admits to being on anti-coagulants. She denies any new trauma or other complaints.     PCP: Redge Gainer, MD  Past Medical History:  Diagnosis Date  . Adenomatous polyps   . AF (atrial fibrillation) ([place])   . Cataract   . Gallstones   . Hx of adenomatous colonic polyps   . Hyperlipidemia    x5 years  . Hypertension    x5 years  . Kyphosis   . Metabolic syndrome X   . Obesity   . Osteoporosis   . Symptomatic menopausal or female climacteric states   . Vitamin D deficiency     Patient Active Problem List   Diagnosis Date Noted  . Sciatica of right side 04/23/2016  . At high risk for falls 04/23/2016  . Increased BMI 09/30/2015  . Hyperlipidemia 09/07/2015  . Low back pain 09/07/2015  . Hypoxia 09/06/2015  . Orthostatic syncope 09/04/2015  . Orthostatic hypotension 09/04/2015  . Thoracic aortic atherosclerosis ([place]) 05/03/2015  . Vitamin D deficiency  02/02/2015  . Pre-diabetes 02/02/2015  . Metabolic syndrome [id number]  . Osteoporosis 03/11/2013  . Atrial fibrillation, unspecified 11/24/2012  . Hypokalemia 05/04/2012  . Obesity   . Symptomatic menopausal or female climacteric states   . Kyphosis   . Gallstones   . Personal history of adenomatous colonic polyps   . Essential hypertension 03/29/2009    Past Surgical History:  Procedure Laterality Date  . ABDOMINAL HYSTERECTOMY    . BLADDER SUSPENSION    . COLONOSCOPY  multiple  . EYE SURGERY Bilateral 09/2015  . VAGINAL HYSTERECTOMY     total    OB History    No data available       Home Medications    Prior to Admission medications   Medication Sig Start Date End Date Taking? Authorizing Provider  atenolol (TENORMIN) 50 MG tablet TAKE (1) TABLET TWICE A DAY. 06/06/16   Chipper Herb, MD  captopril (CAPOTEN) 50 MG tablet TAKE (1) TABLET TWICE A DAY FOR HIGH BLOOD PRESSURE. 04/26/16   Chipper Herb, MD  cephALEXin (KEFLEX) 500 MG capsule Take 1 capsule (500 mg total) by mouth 4 (four) times daily. For 7 days 09/08/16   Tammy Triplett, PA-C  HYDROcodone-acetaminophen (NORCO/VICODIN) 5-325 MG tablet Take 1 tablet by mouth every 6 (six) hours as needed. 09/09/16   Milton Ferguson, MD  meloxicam (MOBIC) 15 MG tablet Take 1 tablet (15 mg total) by mouth daily. 03/22/16  Mary-Margaret Hassell Done, FNP  methocarbamol (ROBAXIN) 500 MG tablet Take 1 tablet (500 mg total) by mouth 2 (two) times daily as needed for muscle spasms. 09/17/16   Merrily Pew, MD  pravastatin (PRAVACHOL) 80 MG tablet TAKE 1 TABLET ONCE DAILY FOR CHOLESTEROL 01/10/16   Chipper Herb, MD  traMADol (ULTRAM) 50 MG tablet Take 1 tablet (50 mg total) by mouth 4 (four) times daily as needed for moderate pain. 08/17/16   Claretta Fraise, MD  valACYclovir (VALTREX) 1000 MG tablet Take 1 tablet (1,000 mg total) by mouth 3 (three) times daily. 08/17/16   Claretta Fraise, MD  Vitamin D, Ergocalciferol, (DRISDOL) 50000 units CAPS  capsule Take 1 capsule (50,000 Units total) by mouth every 7 (seven) days. Patient taking differently: Take 50,000 Units by mouth every 7 (seven) days. Take on Monday's. 01/16/16   Chipper Herb, MD  warfarin (COUMADIN) 2.5 MG tablet TAKE 1 TABLET ONCE DAILY OR AS DIRECTED Patient taking differently: TAKE 1 TABLET ONCE DAILY 06/14/16   Chipper Herb, MD  ZETIA 10 MG tablet TAKE 1 TABLET ONCE DAILY FOR CHOLESTEROL 01/10/16   Chipper Herb, MD    Family History Family History  Problem Relation Age of Onset  . Heart attack Mother   . Stroke Mother   . Heart attack Father   . Alcohol abuse Father   . Stroke Brother   . Heart attack Brother   . Depression Brother     suicide  . Heart attack Other   . Stroke Other   . Breast cancer Other   . Colon cancer Neg Hx     Social History Social History  Substance Use Topics  . Smoking status: Never Smoker  . Smokeless tobacco: Never Used  . Alcohol use No     Allergies   Erythromycin and Penicillins   Review of Systems Review of Systems  Gastrointestinal: Positive for constipation.  Musculoskeletal: Positive for back pain.  Hematological: Bruises/bleeds easily.     Physical Exam Updated Vital Signs BP 151/82 (BP Location: Left Arm)   Pulse 63   Temp 97.5 F (36.4 C) (Oral)   Resp 18   Ht 5\' 1"  (1.549 m)   Wt 151 lb (68.5 kg)   SpO2 97%   BMI 28.53 kg/m   Physical Exam  Constitutional: She appears well-developed and well-nourished. No distress.  HENT:  Head: Normocephalic.  Eyes: Conjunctivae are normal.  Neck: Neck supple.  Cardiovascular: Normal rate and regular rhythm.   Pulmonary/Chest: Effort normal.  Abdominal: Soft. There is no tenderness.  Musculoskeletal: Normal range of motion.  TTP to right upper gluteal area with muscle spasm. No midline TTP.   Neurological: She is alert.  Motor and sensations of the BLE are intact.   Skin: Skin is warm and dry. No rash noted.  Psychiatric: She has a normal mood  and affect.  Nursing note and vitals reviewed.    ED Treatments / Results  DIAGNOSTIC STUDIES: Oxygen Saturation is 97% on RA, normal by my interpretation.    COORDINATION OF CARE: 11:10 PM- Pt advised of plan for treatment and pt agrees.  Labs (all labs ordered are listed, but only abnormal results are displayed) Labs Reviewed - No data to display  EKG  EKG Interpretation None       Radiology No results found.  Procedures Procedures (including critical care time)  Medications Ordered in ED Medications - No data to display   Initial Impression / Assessment and Plan / ED  Course  I have reviewed the triage vital signs and the nursing notes.  Pertinent labs & imaging results that were available during my care of the patient were reviewed by me and considered in my medical decision making (see chart for details).  Clinical Course     Pain gone at time of my evaluation. Likely muscular. No red flags for AAA/pyelo/fracture/cancer that may have arrived since last time she had back pain a week ago. Doubt sciatica without radiation. Will plan for supportive care, I.e. Massage, heat, robaxin, stretching and PCP follow up.   Final Clinical Impressions(s) / ED Diagnoses   Final diagnoses:  Acute right-sided low back pain without sciatica    New Prescriptions New Prescriptions   METHOCARBAMOL (ROBAXIN) 500 MG TABLET    Take 1 tablet (500 mg total) by mouth 2 (two) times daily as needed for muscle spasms.   I personally performed the services described in this documentation, which was scribed in my presence. The recorded information has been reviewed and is accurate.      Merrily Pew, MD 09/17/16 [phone]

## 2016-09-17 ENCOUNTER — Other Ambulatory Visit: Payer: Medicare Other

## 2016-09-17 ENCOUNTER — Telehealth: Payer: Self-pay | Admitting: Family Medicine

## 2016-09-17 DIAGNOSIS — M545 Low back pain: Secondary | ICD-10-CM

## 2016-09-17 MED ORDER — METHOCARBAMOL 500 MG PO TABS: 500.0000 mg | ORAL_TABLET | Freq: Two times a day (BID) | ORAL | 0 refills | Status: DC | PRN

## 2016-09-17 NOTE — Telephone Encounter (Signed)
Lm x2

## 2016-09-17 NOTE — Telephone Encounter (Signed)
Lm 1/15-jhb

## 2016-09-17 NOTE — Telephone Encounter (Signed)
error 

## 2016-09-18 DIAGNOSIS — Z87440 Personal history of urinary (tract) infections: Secondary | ICD-10-CM | POA: Diagnosis not present

## 2016-09-18 DIAGNOSIS — M545 Low back pain: Secondary | ICD-10-CM | POA: Diagnosis not present

## 2016-09-18 DIAGNOSIS — N390 Urinary tract infection, site not specified: Secondary | ICD-10-CM | POA: Diagnosis not present

## 2016-09-18 LAB — MICROSCOPIC EXAMINATION
Epithelial Cells (non renal): >10 /hpf — AB (ref 0–10)
Renal Epithel, UA: NONE SEEN /hpf

## 2016-09-18 LAB — URINALYSIS, COMPLETE
Bilirubin, UA: NEGATIVE
GLUCOSE, UA: NEGATIVE
Leukocytes, UA: NEGATIVE
NITRITE UA: NEGATIVE
SPEC GRAV UA: 1.02 (ref 1.005–1.030)
UUROB: 1 mg/dL (ref 0.2–1.0)
pH, UA: 7 (ref 5.0–7.5)

## 2016-09-20 ENCOUNTER — Other Ambulatory Visit: Payer: Self-pay | Admitting: Family Medicine

## 2016-09-20 LAB — URINE CULTURE

## 2016-10-01 ENCOUNTER — Encounter: Payer: Self-pay | Admitting: Family Medicine

## 2016-10-01 ENCOUNTER — Ambulatory Visit (INDEPENDENT_AMBULATORY_CARE_PROVIDER_SITE_OTHER): Payer: Medicare Other | Admitting: Family Medicine

## 2016-10-01 VITALS — BP 130/67 | HR 90 | Temp 97.7°F | Ht 61.0 in | Wt 150.0 lb

## 2016-10-01 DIAGNOSIS — R911 Solitary pulmonary nodule: Secondary | ICD-10-CM | POA: Diagnosis not present

## 2016-10-01 DIAGNOSIS — E7800 Pure hypercholesterolemia, unspecified: Secondary | ICD-10-CM | POA: Diagnosis not present

## 2016-10-01 DIAGNOSIS — E559 Vitamin D deficiency, unspecified: Secondary | ICD-10-CM

## 2016-10-01 DIAGNOSIS — I10 Essential (primary) hypertension: Secondary | ICD-10-CM

## 2016-10-01 DIAGNOSIS — I700 Atherosclerosis of aorta: Secondary | ICD-10-CM

## 2016-10-01 DIAGNOSIS — R05 Cough: Secondary | ICD-10-CM

## 2016-10-01 DIAGNOSIS — M5441 Lumbago with sciatica, right side: Secondary | ICD-10-CM

## 2016-10-01 DIAGNOSIS — I4891 Unspecified atrial fibrillation: Secondary | ICD-10-CM | POA: Diagnosis not present

## 2016-10-01 DIAGNOSIS — R059 Cough, unspecified: Secondary | ICD-10-CM

## 2016-10-01 NOTE — Progress Notes (Deleted)
Patient ID: Kari [name], female    DOB: [birth year], 83 y.o.   MRN: [id number]  HPI Pt here for follow up and management of chronic medical problems which includes hyperlipidemia and hypertension. He is taking medication regularly. The patient comes to the visit today with her daughter. She is not having any more back pain and therefore does not want to get an MRI of her back at this time. She does have a pulmonary nodule and will be scheduled for repeat CT scan noncontrast in 5-6 months. She has been coughing some it is a dry cough and she has not been running any fever and she is not coughing up anything. She does have a wood heat in the basement. The daughter says she likes to keep the house warmer than necessary. She denies any chest pain or shortness of breath. She denies any trouble with nausea vomiting diarrhea blood in the stool or black tarry bowel movements. She is passing her water without problems.          Patient Active Problem List   Diagnosis Date Noted  . Sciatica of right side 04/23/2016  . At high risk for falls 04/23/2016  . Increased BMI 09/30/2015  . Hyperlipidemia 09/07/2015  . Low back pain 09/07/2015  . Hypoxia 09/06/2015  . Orthostatic syncope 09/04/2015  . Orthostatic hypotension 09/04/2015  . Thoracic aortic atherosclerosis ([place]) 05/03/2015  . Vitamin D deficiency 02/02/2015  . Pre-diabetes 02/02/2015  . Metabolic syndrome 25/36/6440  . Osteoporosis 03/11/2013  . Atrial fibrillation, unspecified 11/24/2012  . Hypokalemia 05/04/2012  . Obesity   . Symptomatic menopausal or female climacteric states   . Kyphosis   . Gallstones   . Personal history of adenomatous colonic polyps   . Essential hypertension 03/29/2009       Outpatient Encounter Prescriptions as of 10/01/2016  Medication Sig  . atenolol (TENORMIN) 50 MG tablet TAKE (1) TABLET TWICE A DAY.  . captopril (CAPOTEN) 50 MG tablet TAKE (1) TABLET TWICE A DAY FOR HIGH BLOOD PRESSURE.  Marland Kitchen  ezetimibe (ZETIA) 10 MG tablet TAKE 1 TABLET ONCE DAILY FOR CHOLESTEROL  . HYDROcodone-acetaminophen (NORCO/VICODIN) 5-325 MG tablet Take 1 tablet by mouth every 6 (six) hours as needed.  . meloxicam (MOBIC) 15 MG tablet Take 1 tablet (15 mg total) by mouth daily.  . methocarbamol (ROBAXIN) 500 MG tablet Take 1 tablet (500 mg total) by mouth 2 (two) times daily as needed for muscle spasms.  . pravastatin (PRAVACHOL) 80 MG tablet TAKE 1 TABLET ONCE DAILY FOR CHOLESTEROL  . valACYclovir (VALTREX) 1000 MG tablet Take 1 tablet (1,000 mg total) by mouth 3 (three) times daily.  . Vitamin D, Ergocalciferol, (DRISDOL) 50000 units CAPS capsule Take 1 capsule (50,000 Units total) by mouth every 7 (seven) days. (Patient taking differently: Take 50,000 Units by mouth every 7 (seven) days. Take on Monday's.)  . warfarin (COUMADIN) 2.5 MG tablet TAKE 1 TABLET ONCE DAILY OR AS DIRECTED (Patient taking differently: TAKE 1 TABLET ONCE DAILY)  . traMADol (ULTRAM) 50 MG tablet Take 1 tablet (50 mg total) by mouth 4 (four) times daily as needed for moderate pain. (Patient not taking: Reported on 10/01/2016)  . [DISCONTINUED] cephALEXin (KEFLEX) 500 MG capsule Take 1 capsule (500 mg total) by mouth 4 (four) times daily. For 7 days   No facility-administered encounter medications on file as of 10/01/2016.                  Review of Systems  Constitutional:  Negative.   HENT: Negative.   Eyes: Negative.   Respiratory: Positive for cough.   Cardiovascular: Negative.   Gastrointestinal: Negative.   Endocrine: Negative.   Genitourinary: Negative.   Musculoskeletal: Negative.   Skin: Negative.   Allergic/Immunologic: Negative.   Neurological: Negative.   Hematological: Negative.   Psychiatric/Behavioral: Negative.        Objective:   Physical Exam  Constitutional: She is oriented to person, place, and time. She appears well-developed and well-nourished. No distress.  HENT:  Head: Normocephalic and  atraumatic.  Right Ear: External ear normal.  Nose: Nose normal.  Mouth/Throat: Oropharynx is clear and moist.  The patient has been trying to clean out wax in the left ear canal and did this this morning and irritated the ear canal it is bleeding currently.  Eyes: Conjunctivae and EOM are normal. Pupils are equal, round, and reactive to light. Right eye exhibits no discharge. Left eye exhibits no discharge. No scleral icterus.  Neck: Normal range of motion. Neck supple. No thyromegaly present.  Cardiovascular: Normal rate, regular rhythm, normal heart sounds and intact distal pulses.   No murmur heard. The heart is irregular irregular at 96/m  Pulmonary/Chest: Effort normal and breath sounds normal. No respiratory distress. She has no wheezes. She has no rales. She exhibits no tenderness.  The patient has a dry cough and there is no wheezes or rales.  Abdominal: Soft. Bowel sounds are normal. She exhibits no mass. There is no tenderness. There is no rebound and no guarding.  Musculoskeletal: Normal range of motion. She exhibits no edema.  Lymphadenopathy:    She has no cervical adenopathy.  Neurological: She is alert and oriented to person, place, and time. She has normal reflexes. No cranial nerve deficit.  Skin: Skin is warm and dry. No rash noted.  Psychiatric: She has a normal mood and affect. Her behavior is normal. Judgment and thought content normal.  Nursing note and vitals reviewed.  BP 130/67 (BP Location: Left Arm)   Pulse 90   Temp 97.7 F (36.5 C) (Oral)   Ht '5\' 1"'$  (1.549 m)   Wt 150 lb (68 kg)   BMI 28.34 kg/m         Assessment & Plan:  1. Essential hypertension -The blood pressure is good and she will continue with current treatment - BMP8+EGFR; Future - CBC with Differential/Platelet; Future - Hepatic function panel; Future - BMP8+EGFR - CBC with Differential/Platelet - Hepatic function panel  2. Thoracic aortic atherosclerosis ([place]) -Continue with  aggressive therapeutic lifestyle changes - CBC with Differential/Platelet; Future - CBC with Differential/Platelet  3. Vitamin D deficiency -Continue with current treatment pending results of lab work - CBC with Differential/Platelet; Future - VITAMIN D 25 Hydroxy (Vit-D Deficiency, Fractures); Future - CBC with Differential/Platelet - VITAMIN D 25 Hydroxy (Vit-D Deficiency, Fractures)  4. Pure hypercholesterolemia -Continue with current treatment and aggressive therapeutic lifestyle changes - CBC with Differential/Platelet; Future - Lipid panel; Future - CBC with Differential/Platelet - Lipid panel  5. Atrial fibrillation, unspecified type [facility]) -Follow-up with cardiology as planned and continue with current blood thinner - CBC with Differential/Platelet; Future - CBC with Differential/Platelet  6. Right-sided low back pain with right-sided sciatica, unspecified chronicity -If problems from this recur the patient will call back and we will arrange for an MRI.  7. Cough -Take Mucinex and use nasal saline along with keeping the house cooler if possible. Also use cool mist humidifier.  8. Solitary pulmonary nodule -Repeat noncontrast CT of  lung in 5-6 months  Patient Instructions                       Medicare Annual Wellness Visit  [facility] and the medical providers at [facility] strive to bring you the best medical care.  In doing so we not only want to address your current medical conditions and concerns but also to detect new conditions early and prevent illness, disease and health-related problems.    Medicare offers a yearly Wellness Visit which allows our clinical staff to assess your need for preventative services including immunizations, lifestyle education, counseling to decrease risk of preventable diseases and screening for fall risk and other medical concerns.    This visit is provided free of charge (no copay) for all Medicare  recipients. The clinical pharmacists at [facility] have begun to conduct these Wellness Visits which will also include a thorough review of all your medications.    As you primary medical provider recommend that you make an appointment for your Annual Wellness Visit if you have not done so already this year.  You may set up this appointment before you leave today or you may call back ([phone]) and schedule an appointment.  Please make sure when you call that you mention that you are scheduling your Annual Wellness Visit with the clinical pharmacist so that the appointment may be made for the proper length of time.     Continue current medications. Continue good therapeutic lifestyle changes which include good diet and exercise. Fall precautions discussed with patient. If an FOBT was given today- please return it to our front desk. If you are over 22 years old - you may need Prevnar 85 or the adult Pneumonia vaccine.  **Flu shots are available--- please call and schedule a FLU-CLINIC appointment**  After your visit with [place] today you will receive a survey in the mail or online from [employer] regarding your care with [place]. Please take a moment to fill this out. Your feedback is very important to [place] as you can help [place] better understand your patient needs as well as improve your experience and satisfaction. WE CARE ABOUT YOU!!!  Please do not forget to remember that you will need a noncontrast CT of the lung to follow-up on the pulmonary nodule in 5-6 months This winter continue to drink plenty of fluids and stay well hydrated and keep the house as cool as possible Use a cool mist humidifier in the house.   Arrie Senate MD

## 2016-10-01 NOTE — Progress Notes (Deleted)
Subjective:    Patient ID: Kari [name], female    DOB: May 14, 1934, 84 y.o.   MRN: [id number]  HPI Pt here for follow up and management of chronic medical problems which includes hyperlipidemia and hypertension. He is taking medication regularly. The patient comes to the visit today with her daughter. She is not having any more back pain and therefore does not want to get an MRI of her back at this time. She does have a pulmonary nodule and will be scheduled for repeat CT scan noncontrast in 5-6 months. She has been coughing some it is a dry cough and she has not been running any fever and she is not coughing up anything. She does have a wood heat in the basement. The daughter says she likes to keep the house warmer than necessary. She denies any chest pain or shortness of breath. She denies any trouble with nausea vomiting diarrhea blood in the stool or black tarry bowel movements. She is passing her water without problems.      Patient Active Problem List   Diagnosis Date Noted  . Sciatica of right side 04/23/2016  . At high risk for falls 04/23/2016  . Increased BMI 09/30/2015  . Hyperlipidemia 09/07/2015  . Low back pain 09/07/2015  . Hypoxia 09/06/2015  . Orthostatic syncope 09/04/2015  . Orthostatic hypotension 09/04/2015  . Thoracic aortic atherosclerosis ([place]) 05/03/2015  . Vitamin D deficiency 02/02/2015  . Pre-diabetes 02/02/2015  . Metabolic syndrome [id number]  . Osteoporosis 03/11/2013  . Atrial fibrillation, unspecified 11/24/2012  . Hypokalemia 05/04/2012  . Obesity   . Symptomatic menopausal or female climacteric states   . Kyphosis   . Gallstones   . Personal history of adenomatous colonic polyps   . Essential hypertension 03/29/2009       Review of Systems  Constitutional: Negative.   HENT: Negative.   Eyes: Negative.   Respiratory: Positive for cough.   Cardiovascular: Negative.   Gastrointestinal: Negative.   Endocrine: Negative.   Genitourinary:  Negative.   Musculoskeletal: Negative.   Skin: Negative.   Allergic/Immunologic: Negative.   Neurological: Negative.   Hematological: Negative.   Psychiatric/Behavioral: Negative.        Objective:   Physical Exam  Constitutional: She is oriented to person, place, and time. She appears well-developed and well-nourished. No distress.  HENT:  Head: Normocephalic and atraumatic.  Right Ear: External ear normal.  Nose: Nose normal.  Mouth/Throat: Oropharynx is clear and moist.  The patient has been trying to clean out wax in the left ear canal and did this this morning and irritated the ear canal it is bleeding currently.  Eyes: Conjunctivae and EOM are normal. Pupils are equal, round, and reactive to light. Right eye exhibits no discharge. Left eye exhibits no discharge. No scleral icterus.  Neck: Normal range of motion. Neck supple. No thyromegaly present.  Cardiovascular: Normal rate, regular rhythm, normal heart sounds and intact distal pulses.   No murmur heard. The heart is irregular irregular at 96/m  Pulmonary/Chest: Effort normal and breath sounds normal. No respiratory distress. She has no wheezes. She has no rales. She exhibits no tenderness.  The patient has a dry cough and there is no wheezes or rales.  Abdominal: Soft. Bowel sounds are normal. She exhibits no mass. There is no tenderness. There is no rebound and no guarding.  Musculoskeletal: Normal range of motion. She exhibits no edema.  Lymphadenopathy:    She has no cervical adenopathy.  Neurological: She is  alert and oriented to person, place, and time. She has normal reflexes. No cranial nerve deficit.  Skin: Skin is warm and dry. No rash noted.  Psychiatric: She has a normal mood and affect. Her behavior is normal. Judgment and thought content normal.  Nursing note and vitals reviewed.

## 2016-10-01 NOTE — Progress Notes (Deleted)
Patient ID: Kari [name], female    DOB: [birth year], 83 y.o.   MRN: [id number]  HPI Pt here for follow up and management of chronic medical problems which includes hyperlipidemia and hypertension. He is taking medication regularly. The patient comes to the visit today with her daughter. She is not having any more back pain and therefore does not want to get an MRI of her back at this time. She does have a pulmonary nodule and will be scheduled for repeat CT scan noncontrast in 5-6 months. She has been coughing some it is a dry cough and she has not been running any fever and she is not coughing up anything. She does have a wood heat in the basement. The daughter says she likes to keep the house warmer than necessary. She denies any chest pain or shortness of breath. She denies any trouble with nausea vomiting diarrhea blood in the stool or black tarry bowel movements. She is passing her water without problems.          Patient Active Problem List   Diagnosis Date Noted  . Sciatica of right side 04/23/2016  . At high risk for falls 04/23/2016  . Increased BMI 09/30/2015  . Hyperlipidemia 09/07/2015  . Low back pain 09/07/2015  . Hypoxia 09/06/2015  . Orthostatic syncope 09/04/2015  . Orthostatic hypotension 09/04/2015  . Thoracic aortic atherosclerosis ([place]) 05/03/2015  . Vitamin D deficiency 02/02/2015  . Pre-diabetes 02/02/2015  . Metabolic syndrome 25/36/6440  . Osteoporosis 03/11/2013  . Atrial fibrillation, unspecified 11/24/2012  . Hypokalemia 05/04/2012  . Obesity   . Symptomatic menopausal or female climacteric states   . Kyphosis   . Gallstones   . Personal history of adenomatous colonic polyps   . Essential hypertension 03/29/2009       Outpatient Encounter Prescriptions as of 10/01/2016  Medication Sig  . atenolol (TENORMIN) 50 MG tablet TAKE (1) TABLET TWICE A DAY.  . captopril (CAPOTEN) 50 MG tablet TAKE (1) TABLET TWICE A DAY FOR HIGH BLOOD PRESSURE.  Marland Kitchen  ezetimibe (ZETIA) 10 MG tablet TAKE 1 TABLET ONCE DAILY FOR CHOLESTEROL  . HYDROcodone-acetaminophen (NORCO/VICODIN) 5-325 MG tablet Take 1 tablet by mouth every 6 (six) hours as needed.  . meloxicam (MOBIC) 15 MG tablet Take 1 tablet (15 mg total) by mouth daily.  . methocarbamol (ROBAXIN) 500 MG tablet Take 1 tablet (500 mg total) by mouth 2 (two) times daily as needed for muscle spasms.  . pravastatin (PRAVACHOL) 80 MG tablet TAKE 1 TABLET ONCE DAILY FOR CHOLESTEROL  . valACYclovir (VALTREX) 1000 MG tablet Take 1 tablet (1,000 mg total) by mouth 3 (three) times daily.  . Vitamin D, Ergocalciferol, (DRISDOL) 50000 units CAPS capsule Take 1 capsule (50,000 Units total) by mouth every 7 (seven) days. (Patient taking differently: Take 50,000 Units by mouth every 7 (seven) days. Take on Monday's.)  . warfarin (COUMADIN) 2.5 MG tablet TAKE 1 TABLET ONCE DAILY OR AS DIRECTED (Patient taking differently: TAKE 1 TABLET ONCE DAILY)  . traMADol (ULTRAM) 50 MG tablet Take 1 tablet (50 mg total) by mouth 4 (four) times daily as needed for moderate pain. (Patient not taking: Reported on 10/01/2016)  . [DISCONTINUED] cephALEXin (KEFLEX) 500 MG capsule Take 1 capsule (500 mg total) by mouth 4 (four) times daily. For 7 days   No facility-administered encounter medications on file as of 10/01/2016.                  Review of Systems  Constitutional:  Negative.   HENT: Negative.   Eyes: Negative.   Respiratory: Positive for cough.   Cardiovascular: Negative.   Gastrointestinal: Negative.   Endocrine: Negative.   Genitourinary: Negative.   Musculoskeletal: Negative.   Skin: Negative.   Allergic/Immunologic: Negative.   Neurological: Negative.   Hematological: Negative.   Psychiatric/Behavioral: Negative.        Objective:   Physical Exam  Constitutional: She is oriented to person, place, and time. She appears well-developed and well-nourished. No distress.  HENT:  Head: Normocephalic and  atraumatic.  Right Ear: External ear normal.  Nose: Nose normal.  Mouth/Throat: Oropharynx is clear and moist.  The patient has been trying to clean out wax in the left ear canal and did this this morning and irritated the ear canal it is bleeding currently.  Eyes: Conjunctivae and EOM are normal. Pupils are equal, round, and reactive to light. Right eye exhibits no discharge. Left eye exhibits no discharge. No scleral icterus.  Neck: Normal range of motion. Neck supple. No thyromegaly present.  Cardiovascular: Normal rate, regular rhythm, normal heart sounds and intact distal pulses.   No murmur heard. The heart is irregular irregular at 96/m  Pulmonary/Chest: Effort normal and breath sounds normal. No respiratory distress. She has no wheezes. She has no rales. She exhibits no tenderness.  The patient has a dry cough and there is no wheezes or rales.  Abdominal: Soft. Bowel sounds are normal. She exhibits no mass. There is no tenderness. There is no rebound and no guarding.  Musculoskeletal: Normal range of motion. She exhibits no edema.  Lymphadenopathy:    She has no cervical adenopathy.  Neurological: She is alert and oriented to person, place, and time. She has normal reflexes. No cranial nerve deficit.  Skin: Skin is warm and dry. No rash noted.  Psychiatric: She has a normal mood and affect. Her behavior is normal. Judgment and thought content normal.  Nursing note and vitals reviewed.  BP 130/67 (BP Location: Left Arm)   Pulse 90   Temp 97.7 F (36.5 C) (Oral)   Ht 5\' 1"  (1.549 m)   Wt 150 lb (68 kg)   BMI 28.34 kg/m         Assessment & Plan:  1. Essential hypertension -The blood pressure is good and she will continue with current treatment - BMP8+EGFR; Future - CBC with Differential/Platelet; Future - Hepatic function panel; Future - BMP8+EGFR - CBC with Differential/Platelet - Hepatic function panel  2. Thoracic aortic atherosclerosis (HCC) -Continue with  aggressive therapeutic lifestyle changes - CBC with Differential/Platelet; Future - CBC with Differential/Platelet  3. Vitamin D deficiency -Continue with current treatment pending results of lab work - CBC with Differential/Platelet; Future - VITAMIN D 25 Hydroxy (Vit-D Deficiency, Fractures); Future - CBC with Differential/Platelet - VITAMIN D 25 Hydroxy (Vit-D Deficiency, Fractures)  4. Pure hypercholesterolemia -Continue with current treatment and aggressive therapeutic lifestyle changes - CBC with Differential/Platelet; Future - Lipid panel; Future - CBC with Differential/Platelet - Lipid panel  5. Atrial fibrillation, unspecified type [facility]) -Follow-up with cardiology as planned and continue with current blood thinner - CBC with Differential/Platelet; Future - CBC with Differential/Platelet  6. Right-sided low back pain with right-sided sciatica, unspecified chronicity -If problems from this recur the patient will call back and we will arrange for an MRI.  7. Cough -Take Mucinex and use nasal saline along with keeping the house cooler if possible. Also use cool mist humidifier.  8. Solitary pulmonary nodule -Repeat noncontrast CT of  lung in 5-6 months  Patient Instructions                       Medicare Annual Wellness Visit  [facility] and the medical providers at [facility] strive to bring you the best medical care.  In doing so we not only want to address your current medical conditions and concerns but also to detect new conditions early and prevent illness, disease and health-related problems.    Medicare offers a yearly Wellness Visit which allows our clinical staff to assess your need for preventative services including immunizations, lifestyle education, counseling to decrease risk of preventable diseases and screening for fall risk and other medical concerns.    This visit is provided free of charge (no copay) for all Medicare  recipients. The clinical pharmacists at [facility] have begun to conduct these Wellness Visits which will also include a thorough review of all your medications.    As you primary medical provider recommend that you make an appointment for your Annual Wellness Visit if you have not done so already this year.  You may set up this appointment before you leave today or you may call back [phone] and schedule an appointment.  Please make sure when you call that you mention that you are scheduling your Annual Wellness Visit with the clinical pharmacist so that the appointment may be made for the proper length of time.     Continue current medications. Continue good therapeutic lifestyle changes which include good diet and exercise. Fall precautions discussed with patient. If an FOBT was given today- please return it to our front desk. If you are over 22 years old - you may need Prevnar 85 or the adult Pneumonia vaccine.  **Flu shots are available--- please call and schedule a FLU-CLINIC appointment**  After your visit with [place] today you will receive a survey in the mail or online from [employer] regarding your care with [place]. Please take a moment to fill this out. Your feedback is very important to [place] as you can help [place] better understand your patient needs as well as improve your experience and satisfaction. WE CARE ABOUT YOU!!!  Please do not forget to remember that you will need a noncontrast CT of the lung to follow-up on the pulmonary nodule in 5-6 months This winter continue to drink plenty of fluids and stay well hydrated and keep the house as cool as possible Use a cool mist humidifier in the house.   Arrie Senate MD

## 2016-10-01 NOTE — Patient Instructions (Addendum)
Medicare Annual Wellness Visit  [facility] and the medical providers at [facility] strive to bring you the best medical care.  In doing so we not only want to address your current medical conditions and concerns but also to detect new conditions early and prevent illness, disease and health-related problems.    Medicare offers a yearly Wellness Visit which allows our clinical staff to assess your need for preventative services including immunizations, lifestyle education, counseling to decrease risk of preventable diseases and screening for fall risk and other medical concerns.    This visit is provided free of charge (no copay) for all Medicare recipients. The clinical pharmacists at [facility] have begun to conduct these Wellness Visits which will also include a thorough review of all your medications.    As you primary medical provider recommend that you make an appointment for your Annual Wellness Visit if you have not done so already this year.  You may set up this appointment before you leave today or you may call back [phone] and schedule an appointment.  Please make sure when you call that you mention that you are scheduling your Annual Wellness Visit with the clinical pharmacist so that the appointment may be made for the proper length of time.     Continue current medications. Continue good therapeutic lifestyle changes which include good diet and exercise. Fall precautions discussed with patient. If an FOBT was given today- please return it to our front desk. If you are over 43 years old - you may need Prevnar 16 or the adult Pneumonia vaccine.  **Flu shots are available--- please call and schedule a FLU-CLINIC appointment**  After your visit with [place] today you will receive a survey in the mail or online from [employer] regarding your care with [place]. Please take a moment to fill this out. Your feedback is very  important to [place] as you can help [place] better understand your patient needs as well as improve your experience and satisfaction. WE CARE ABOUT YOU!!!  Please do not forget to remember that you will need a noncontrast CT of the lung to follow-up on the pulmonary nodule in 5-6 months This winter continue to drink plenty of fluids and stay well hydrated and keep the house as cool as possible Use a cool mist humidifier in the house.

## 2016-10-02 ENCOUNTER — Ambulatory Visit (INDEPENDENT_AMBULATORY_CARE_PROVIDER_SITE_OTHER): Payer: Medicare Other | Admitting: Pharmacist

## 2016-10-02 DIAGNOSIS — I4891 Unspecified atrial fibrillation: Secondary | ICD-10-CM

## 2016-10-02 LAB — CBC WITH DIFFERENTIAL/PLATELET
BASOS: 0 %
Basophils Absolute: 0 10*3/uL (ref 0.0–0.2)
EOS (ABSOLUTE): 0.3 10*3/uL (ref 0.0–0.4)
EOS: 6 %
HEMATOCRIT: 38.5 % (ref 34.0–46.6)
HEMOGLOBIN: 12 g/dL (ref 11.1–15.9)
IMMATURE GRANULOCYTES: 0 %
Immature Grans (Abs): 0 10*3/uL (ref 0.0–0.1)
LYMPHS ABS: 1 10*3/uL (ref 0.7–3.1)
Lymphs: 16 %
MCH: 25.3 pg — ABNORMAL LOW (ref 26.6–33.0)
MCHC: 31.2 g/dL — ABNORMAL LOW (ref 31.5–35.7)
MCV: 81 fL (ref 79–97)
MONOCYTES: 9 %
Monocytes Absolute: 0.5 10*3/uL (ref 0.1–0.9)
NEUTROS PCT: 69 %
Neutrophils Absolute: 4.1 10*3/uL (ref 1.4–7.0)
Platelets: 255 10*3/uL (ref 150–379)
RBC: 4.74 x10E6/uL (ref 3.77–5.28)
RDW: 17.1 % — ABNORMAL HIGH (ref 12.3–15.4)
WBC: 6 10*3/uL (ref 3.4–10.8)

## 2016-10-02 LAB — HEPATIC FUNCTION PANEL
ALK PHOS: 75 IU/L (ref 39–117)
ALT: 9 IU/L (ref 0–32)
AST: 17 IU/L (ref 0–40)
Albumin: 3.9 g/dL (ref 3.5–4.7)
Bilirubin Total: 0.5 mg/dL (ref 0.0–1.2)
Bilirubin, Direct: 0.15 mg/dL (ref 0.00–0.40)
Total Protein: 6.9 g/dL (ref 6.0–8.5)

## 2016-10-02 LAB — BMP8+EGFR
BUN/Creatinine Ratio: 10 — ABNORMAL LOW (ref 12–28)
BUN: 7 mg/dL — AB (ref 8–27)
CO2: 25 mmol/L (ref 18–29)
CREATININE: 0.69 mg/dL (ref 0.57–1.00)
Calcium: 9.2 mg/dL (ref 8.7–10.3)
Chloride: 99 mmol/L (ref 96–106)
GFR calc Af Amer: 95 mL/min/{1.73_m2} (ref >59)
GFR, EST NON AFRICAN AMERICAN: 82 mL/min/{1.73_m2} (ref >59)
GLUCOSE: 132 mg/dL — AB (ref 65–99)
Potassium: 3.4 mmol/L — ABNORMAL LOW (ref 3.5–5.2)
Sodium: 141 mmol/L (ref 134–144)

## 2016-10-02 LAB — LIPID PANEL
CHOLESTEROL TOTAL: 122 mg/dL (ref 100–199)
Chol/HDL Ratio: 3.1 ratio units (ref 0.0–4.4)
HDL: 39 mg/dL — ABNORMAL LOW (ref >39)
LDL CALC: 61 mg/dL (ref 0–99)
TRIGLYCERIDES: 110 mg/dL (ref 0–149)
VLDL CHOLESTEROL CAL: 22 mg/dL (ref 5–40)

## 2016-10-02 LAB — COAGUCHEK XS/INR WAIVED
INR: 2.3 — ABNORMAL HIGH (ref 0.9–1.1)
PROTHROMBIN TIME: 27.6 s

## 2016-10-02 LAB — VITAMIN D 25 HYDROXY (VIT D DEFICIENCY, FRACTURES): Vit D, 25-Hydroxy: 34.1 ng/mL (ref 30.0–100.0)

## 2016-10-02 NOTE — Progress Notes (Signed)
Patient ID: Kari [name], female    DOB: Jan 12, 1934, 83 y.o.   MRN: [id number]  HPI Pt here for follow up and management of chronic medical problems which includes hyperlipidemia and hypertension. He is taking medication regularly.The patient has had less back pain. We had decided previously that we will send her to have an MRI of her LS-spine. Since she is doing better, we will defer this and this is what she would like. She does complain of a cough today. She will get lab work today. She comes to the visit today with her daughter Kari [name]. She is doing better with her back. She denies any chest pain or shortness of breath. She denies any trouble with heartburn indigestion nausea vomiting diarrhea or blood in the stool. She is passing her water without problems.         Patient Active Problem List   Diagnosis Date Noted  . Sciatica of right side 04/23/2016  . At high risk for falls 04/23/2016  . Increased BMI 09/30/2015  . Hyperlipidemia 09/07/2015  . Low back pain 09/07/2015  . Hypoxia 09/06/2015  . Orthostatic syncope 09/04/2015  . Orthostatic hypotension 09/04/2015  . Thoracic aortic atherosclerosis ([place]) 05/03/2015  . Vitamin D deficiency 02/02/2015  . Pre-diabetes 02/02/2015  . Metabolic syndrome [id number]  . Osteoporosis 03/11/2013  . Atrial fibrillation, unspecified 11/24/2012  . Hypokalemia 05/04/2012  . Obesity   . Symptomatic menopausal or female climacteric states   . Kyphosis   . Gallstones   . Personal history of adenomatous colonic polyps   . Essential hypertension 03/29/2009       Outpatient Encounter Prescriptions as of 10/01/2016  Medication Sig  . atenolol (TENORMIN) 50 MG tablet TAKE (1) TABLET TWICE A DAY.  . captopril (CAPOTEN) 50 MG tablet TAKE (1) TABLET TWICE A DAY FOR HIGH BLOOD PRESSURE.  Marland Kitchen ezetimibe (ZETIA) 10 MG tablet TAKE 1 TABLET ONCE DAILY FOR CHOLESTEROL  . HYDROcodone-acetaminophen (NORCO/VICODIN) 5-325 MG tablet Take 1 tablet by mouth  every 6 (six) hours as needed.  . meloxicam (MOBIC) 15 MG tablet Take 1 tablet (15 mg total) by mouth daily.  . methocarbamol (ROBAXIN) 500 MG tablet Take 1 tablet (500 mg total) by mouth 2 (two) times daily as needed for muscle spasms.  . pravastatin (PRAVACHOL) 80 MG tablet TAKE 1 TABLET ONCE DAILY FOR CHOLESTEROL  . valACYclovir (VALTREX) 1000 MG tablet Take 1 tablet (1,000 mg total) by mouth 3 (three) times daily.  . Vitamin D, Ergocalciferol, (DRISDOL) 50000 units CAPS capsule Take 1 capsule (50,000 Units total) by mouth every 7 (seven) days. (Patient taking differently: Take 50,000 Units by mouth every 7 (seven) days. Take on Monday's.)  . warfarin (COUMADIN) 2.5 MG tablet TAKE 1 TABLET ONCE DAILY OR AS DIRECTED (Patient taking differently: TAKE 1 TABLET ONCE DAILY)  . traMADol (ULTRAM) 50 MG tablet Take 1 tablet (50 mg total) by mouth 4 (four) times daily as needed for moderate pain. (Patient not taking: Reported on 10/01/2016)  . [DISCONTINUED] cephALEXin (KEFLEX) 500 MG capsule Take 1 capsule (500 mg total) by mouth 4 (four) times daily. For 7 days   No facility-administered encounter medications on file as of 10/01/2016.                  Review of Systems  Constitutional: Negative.   HENT: Negative.   Eyes: Negative.   Respiratory: Positive for cough.   Cardiovascular: Negative.   Gastrointestinal: Negative.   Endocrine: Negative.   Genitourinary: Negative.  Musculoskeletal: Negative.   Skin: Negative.   Allergic/Immunologic: Negative.   Neurological: Negative.   Hematological: Negative.   Psychiatric/Behavioral: Negative.        Objective:   Physical Exam  Constitutional: She is oriented to person, place, and time. She appears well-developed and well-nourished. No distress.  HENT:  Head: Normocephalic and atraumatic.  Right Ear: External ear normal.  Nose: Nose normal.  Mouth/Throat: Oropharynx is clear and moist.  The patient has been trying to clean out wax  in the left ear canal and did this this morning and irritated the ear canal it is bleeding currently.  Eyes: Conjunctivae and EOM are normal. Pupils are equal, round, and reactive to light. Right eye exhibits no discharge. Left eye exhibits no discharge. No scleral icterus.  Neck: Normal range of motion. Neck supple. No thyromegaly present.  Cardiovascular: Normal rate, regular rhythm, normal heart sounds and intact distal pulses.   No murmur heard. The heart is irregular irregular at 96/m  Pulmonary/Chest: Effort normal and breath sounds normal. No respiratory distress. She has no wheezes. She has no rales. She exhibits no tenderness.  The patient has a dry cough and there is no wheezes or rales.  Abdominal: Soft. Bowel sounds are normal. She exhibits no mass. There is no tenderness. There is no rebound and no guarding.  Musculoskeletal: Normal range of motion. She exhibits no edema.  Lymphadenopathy:    She has no cervical adenopathy.  Neurological: She is alert and oriented to person, place, and time. She has normal reflexes. No cranial nerve deficit.  Skin: Skin is warm and dry. No rash noted.  Psychiatric: She has a normal mood and affect. Her behavior is normal. Judgment and thought content normal.  Nursing note and vitals reviewed.   BP 130/67 (BP Location: Left Arm)   Pulse 90   Temp 97.7 F (36.5 C) (Oral)   Ht 5\' 1"  (1.549 m)   Wt 150 lb (68 kg)   BMI 28.34 kg/m   Arrie Senate MD

## 2016-10-03 ENCOUNTER — Other Ambulatory Visit: Payer: Self-pay | Admitting: *Deleted

## 2016-10-03 DIAGNOSIS — R799 Abnormal finding of blood chemistry, unspecified: Secondary | ICD-10-CM

## 2016-10-03 MED ORDER — POTASSIUM CHLORIDE ER 10 MEQ PO TBCR: 10.0000 meq | EXTENDED_RELEASE_TABLET | Freq: Every day | ORAL | 5 refills | Status: DC

## 2016-10-05 ENCOUNTER — Other Ambulatory Visit: Payer: Self-pay | Admitting: *Deleted

## 2016-10-05 MED ORDER — WARFARIN SODIUM 2.5 MG PO TABS: ORAL_TABLET | ORAL | 6 refills | Status: DC

## 2016-10-29 ENCOUNTER — Other Ambulatory Visit: Payer: Self-pay | Admitting: Family Medicine

## 2016-11-06 ENCOUNTER — Ambulatory Visit (INDEPENDENT_AMBULATORY_CARE_PROVIDER_SITE_OTHER): Payer: Medicare Other | Admitting: Family Medicine

## 2016-11-06 ENCOUNTER — Encounter: Payer: Self-pay | Admitting: Family Medicine

## 2016-11-06 VITALS — BP 139/88 | HR 80 | Temp 97.3°F | Ht 61.0 in | Wt 149.2 lb

## 2016-11-06 DIAGNOSIS — N3000 Acute cystitis without hematuria: Secondary | ICD-10-CM

## 2016-11-06 DIAGNOSIS — R109 Unspecified abdominal pain: Secondary | ICD-10-CM | POA: Diagnosis not present

## 2016-11-06 LAB — URINALYSIS
BILIRUBIN UA: NEGATIVE
GLUCOSE, UA: NEGATIVE
Nitrite, UA: NEGATIVE
SPEC GRAV UA: 1.015 (ref 1.005–1.030)
UUROB: 1 mg/dL (ref 0.2–1.0)
pH, UA: 7 (ref 5.0–7.5)

## 2016-11-06 MED ORDER — CEPHALEXIN 500 MG PO CAPS
500.0000 mg | ORAL_CAPSULE | Freq: Four times a day (QID) | ORAL | 0 refills | Status: DC
Start: 2016-11-06 — End: 2016-12-05

## 2016-11-06 NOTE — Progress Notes (Signed)
BP 139/88   Pulse 80   Temp 97.3 F (36.3 C) (Oral)   Ht 5\' 1"  (1.549 m)   Wt 149 lb 4 oz (67.7 kg)   BMI 28.20 kg/m    Subjective:    Patient ID: Kari [name], female    DOB: 03/03/1934, 83 y.o.   MRN: [id number]  HPI: Kari [name] is a 83 y.o. female presenting on 11/06/2016 for Right sided lower back pain (has been to ER 3 times since January with this pain; hurts worse when she is sitting)   HPI Right flank pain Patient is coming in today with right flank pain that's been going on this time for the past 5 days. She has had this on 3 separate occasions over the past few months and one time they told her was muscles in the upper muscle relaxant and pain killer. It improved after that time and then the second time they diagnosed her with a urinary tract infection and treat her with an antibiotic and again improved from that time but this time is started back up again 5 days ago. She says her pain is 8 out of 10 and is worse with sitting then was standing but she can't stand all day either because eventually it will start hurting as well. Also during one of her hospital visits they did a CT scan with diagnosed her with a renal stone that was still in the renal pelvis on her right side. She denies any hematuria but does have some urinary frequency. She denies any dysuria or abdominal pain. She denies any fevers or chills or shortness of breath or wheezing.  Relevant past medical, surgical, family and social history reviewed and updated as indicated. Interim medical history since our last visit reviewed. Allergies and medications reviewed and updated.  Review of Systems  Constitutional: Negative for chills and fever.  Respiratory: Negative for chest tightness and shortness of breath.   Cardiovascular: Negative for chest pain and leg swelling.  Gastrointestinal: Negative for abdominal pain, blood in stool, constipation, diarrhea, nausea and vomiting.  Genitourinary: Positive for flank  pain. Negative for decreased urine volume, difficulty urinating, dysuria, frequency, hematuria, urgency, vaginal bleeding, vaginal discharge and vaginal pain.  Musculoskeletal: Positive for back pain. Negative for gait problem.  Skin: Negative for rash.  Neurological: Negative for light-headedness and headaches.  Psychiatric/Behavioral: Negative for agitation and behavioral problems.  All other systems reviewed and are negative.   Per HPI unless specifically indicated above     Objective:    BP 139/88   Pulse 80   Temp 97.3 F (36.3 C) (Oral)   Ht 5\' 1"  (1.549 m)   Wt 149 lb 4 oz (67.7 kg)   BMI 28.20 kg/m   Wt Readings from Last 3 Encounters:  11/06/16 149 lb 4 oz (67.7 kg)  10/01/16 150 lb (68 kg)  09/16/16 151 lb (68.5 kg)    Physical Exam  Constitutional: She is oriented to person, place, and time. She appears well-developed and well-nourished. No distress.  Eyes: Conjunctivae are normal.  Cardiovascular: Normal rate, regular rhythm, normal heart sounds and intact distal pulses.   No murmur heard. Pulmonary/Chest: Effort normal and breath sounds normal. No respiratory distress. She has no wheezes. She has no rales.  Abdominal: Soft. Bowel sounds are normal. She exhibits no distension. There is no hepatosplenomegaly. There is no tenderness. There is no rigidity, no rebound, no guarding and no CVA tenderness (No reproducible tenderness on exam).  Musculoskeletal: Normal  range of motion. She exhibits no edema or tenderness (No reproducible tenderness on exam).  Neurological: She is alert and oriented to person, place, and time. Coordination normal.  Skin: Skin is warm and dry. No rash noted. She is not diaphoretic.  Psychiatric: She has a normal mood and affect. Her behavior is normal.  Nursing note and vitals reviewed.   Results for orders placed or performed in visit on 10/02/16  CoaguChek XS/INR Waived  Result Value Ref Range   INR 2.3 (H) 0.9 - 1.1   Prothrombin Time  27.6 sec      Assessment & Plan:   Problem List Items Addressed This Visit    None    Visit Diagnoses    Acute cystitis without hematuria    -  Primary   Relevant Medications   cephALEXin (KEFLEX) 500 MG capsule   Other Relevant Orders   Urinalysis (Completed)   Urine culture       Follow up plan: Return if symptoms worsen or fail to improve.  Counseling provided for all of the vaccine components Orders Placed This Encounter  Procedures  . Urine culture  . Urinalysis    Caryl Pina, MD [facility] 11/06/2016, 11:19 AM

## 2016-11-07 ENCOUNTER — Ambulatory Visit (INDEPENDENT_AMBULATORY_CARE_PROVIDER_SITE_OTHER): Payer: Medicare Other | Admitting: Pharmacist

## 2016-11-07 DIAGNOSIS — I4891 Unspecified atrial fibrillation: Secondary | ICD-10-CM | POA: Diagnosis not present

## 2016-11-07 LAB — COAGUCHEK XS/INR WAIVED
INR: 2.4 — ABNORMAL HIGH (ref 0.9–1.1)
PROTHROMBIN TIME: 28.8 s

## 2016-11-08 LAB — URINE CULTURE

## 2016-11-20 DIAGNOSIS — B351 Tinea unguium: Secondary | ICD-10-CM | POA: Diagnosis not present

## 2016-11-20 DIAGNOSIS — I70203 Unspecified atherosclerosis of native arteries of extremities, bilateral legs: Secondary | ICD-10-CM | POA: Diagnosis not present

## 2016-11-20 DIAGNOSIS — L84 Corns and callosities: Secondary | ICD-10-CM | POA: Diagnosis not present

## 2016-11-26 DIAGNOSIS — H3530 Unspecified macular degeneration: Secondary | ICD-10-CM | POA: Diagnosis not present

## 2016-12-03 ENCOUNTER — Other Ambulatory Visit: Payer: Self-pay | Admitting: Family Medicine

## 2016-12-04 DIAGNOSIS — J069 Acute upper respiratory infection, unspecified: Secondary | ICD-10-CM | POA: Diagnosis not present

## 2016-12-04 DIAGNOSIS — R05 Cough: Secondary | ICD-10-CM | POA: Diagnosis not present

## 2016-12-04 DIAGNOSIS — J301 Allergic rhinitis due to pollen: Secondary | ICD-10-CM | POA: Diagnosis not present

## 2016-12-05 ENCOUNTER — Emergency Department (HOSPITAL_COMMUNITY): Payer: Medicare Other

## 2016-12-05 ENCOUNTER — Ambulatory Visit (INDEPENDENT_AMBULATORY_CARE_PROVIDER_SITE_OTHER): Payer: Medicare Other | Admitting: Pharmacist

## 2016-12-05 ENCOUNTER — Encounter (HOSPITAL_COMMUNITY): Payer: Self-pay

## 2016-12-05 ENCOUNTER — Observation Stay (HOSPITAL_COMMUNITY)
Admission: EM | Admit: 2016-12-05 | Discharge: 2016-12-06 | Disposition: A | Discharge status: Home / Self Care | Payer: Medicare Other | Attending: Internal Medicine | Admitting: Internal Medicine

## 2016-12-05 DIAGNOSIS — E876 Hypokalemia: Secondary | ICD-10-CM | POA: Diagnosis not present

## 2016-12-05 DIAGNOSIS — I4891 Unspecified atrial fibrillation: Secondary | ICD-10-CM | POA: Diagnosis present

## 2016-12-05 DIAGNOSIS — Z881 Allergy status to other antibiotic agents status: Secondary | ICD-10-CM | POA: Insufficient documentation

## 2016-12-05 DIAGNOSIS — E860 Dehydration: Secondary | ICD-10-CM

## 2016-12-05 DIAGNOSIS — Z79899 Other long term (current) drug therapy: Secondary | ICD-10-CM | POA: Diagnosis not present

## 2016-12-05 DIAGNOSIS — Z66 Do not resuscitate: Secondary | ICD-10-CM | POA: Diagnosis not present

## 2016-12-05 DIAGNOSIS — E8881 Metabolic syndrome: Secondary | ICD-10-CM | POA: Insufficient documentation

## 2016-12-05 DIAGNOSIS — N951 Menopausal and female climacteric states: Secondary | ICD-10-CM | POA: Diagnosis not present

## 2016-12-05 DIAGNOSIS — E559 Vitamin D deficiency, unspecified: Secondary | ICD-10-CM | POA: Insufficient documentation

## 2016-12-05 DIAGNOSIS — Z880 Allergy status to penicillin: Secondary | ICD-10-CM | POA: Insufficient documentation

## 2016-12-05 DIAGNOSIS — I10 Essential (primary) hypertension: Secondary | ICD-10-CM | POA: Insufficient documentation

## 2016-12-05 DIAGNOSIS — E785 Hyperlipidemia, unspecified: Secondary | ICD-10-CM | POA: Diagnosis not present

## 2016-12-05 DIAGNOSIS — I481 Persistent atrial fibrillation: Secondary | ICD-10-CM | POA: Diagnosis not present

## 2016-12-05 DIAGNOSIS — Z7901 Long term (current) use of anticoagulants: Secondary | ICD-10-CM | POA: Insufficient documentation

## 2016-12-05 DIAGNOSIS — R531 Weakness: Secondary | ICD-10-CM | POA: Diagnosis present

## 2016-12-05 DIAGNOSIS — E669 Obesity, unspecified: Secondary | ICD-10-CM | POA: Insufficient documentation

## 2016-12-05 DIAGNOSIS — J9601 Acute respiratory failure with hypoxia: Secondary | ICD-10-CM

## 2016-12-05 DIAGNOSIS — R000 Tachycardia, unspecified: Secondary | ICD-10-CM | POA: Diagnosis not present

## 2016-12-05 DIAGNOSIS — Z6829 Body mass index (BMI) 29.0-29.9, adult: Secondary | ICD-10-CM | POA: Diagnosis not present

## 2016-12-05 DIAGNOSIS — R05 Cough: Secondary | ICD-10-CM | POA: Diagnosis not present

## 2016-12-05 DIAGNOSIS — J101 Influenza due to other identified influenza virus with other respiratory manifestations: Secondary | ICD-10-CM | POA: Diagnosis not present

## 2016-12-05 DIAGNOSIS — I4819 Other persistent atrial fibrillation: Secondary | ICD-10-CM

## 2016-12-05 LAB — CBC WITH DIFFERENTIAL/PLATELET
Basophils Absolute: 0 10*3/uL (ref 0.0–0.1)
Basophils Relative: 0 %
Eosinophils Absolute: 0 10*3/uL (ref 0.0–0.7)
Eosinophils Relative: 0 %
HEMATOCRIT: 38.5 % (ref 36.0–46.0)
HEMOGLOBIN: 12.1 g/dL (ref 12.0–15.0)
LYMPHS ABS: 0.9 10*3/uL (ref 0.7–4.0)
LYMPHS PCT: 10 %
MCH: 24.8 pg — AB (ref 26.0–34.0)
MCHC: 31.4 g/dL (ref 30.0–36.0)
MCV: 79.1 fL (ref 78.0–100.0)
MONOS PCT: 8 %
Monocytes Absolute: 0.7 10*3/uL (ref 0.1–1.0)
NEUTROS ABS: 7.1 10*3/uL (ref 1.7–7.7)
Neutrophils Relative %: 82 %
Platelets: 223 10*3/uL (ref 150–400)
RBC: 4.87 MIL/uL (ref 3.87–5.11)
RDW: 16.7 % — ABNORMAL HIGH (ref 11.5–15.5)
WBC: 8.7 10*3/uL (ref 4.0–10.5)

## 2016-12-05 LAB — I-STAT CG4 LACTIC ACID, ED: Lactic Acid, Venous: 1.27 mmol/L (ref 0.5–1.9)

## 2016-12-05 LAB — COAGUCHEK XS/INR WAIVED
INR: 2.1 — ABNORMAL HIGH (ref 0.9–1.1)
PROTHROMBIN TIME: 24.7 s

## 2016-12-05 MED ORDER — LEVOFLOXACIN IN D5W 750 MG/150ML IV SOLN
750.0000 mg | Freq: Once | INTRAVENOUS | Status: AC
  Administered 2016-12-05: 750 mg via INTRAVENOUS
  Filled 2016-12-05: qty 150

## 2016-12-05 MED ORDER — VANCOMYCIN HCL IN DEXTROSE 1-5 GM/200ML-% IV SOLN
1000.0000 mg | Freq: Once | INTRAVENOUS | Status: AC
  Administered 2016-12-05: 1000 mg via INTRAVENOUS
  Filled 2016-12-05: qty 200

## 2016-12-05 MED ORDER — ALBUTEROL SULFATE (2.5 MG/3ML) 0.083% IN NEBU
5.0000 mg | INHALATION_SOLUTION | Freq: Once | RESPIRATORY_TRACT | Status: AC
  Administered 2016-12-06: 5 mg via RESPIRATORY_TRACT
  Filled 2016-12-05: qty 6

## 2016-12-05 MED ORDER — SODIUM CHLORIDE 0.9 % IV BOLUS (SEPSIS)
1000.0000 mL | Freq: Once | INTRAVENOUS | Status: AC
  Administered 2016-12-06: 1000 mL via INTRAVENOUS

## 2016-12-05 MED ORDER — ACETAMINOPHEN 325 MG PO TABS
650.0000 mg | ORAL_TABLET | Freq: Once | ORAL | Status: AC
  Administered 2016-12-05: 650 mg via ORAL
  Filled 2016-12-05: qty 2

## 2016-12-05 MED ORDER — SODIUM CHLORIDE 0.9 % IV BOLUS (SEPSIS): 250.0000 mL | Freq: Once | INTRAVENOUS | Status: DC

## 2016-12-05 MED ORDER — SODIUM CHLORIDE 0.9 % IV BOLUS (SEPSIS)
1000.0000 mL | Freq: Once | INTRAVENOUS | Status: AC
  Administered 2016-12-05: 1000 mL via INTRAVENOUS

## 2016-12-05 NOTE — ED Triage Notes (Signed)
Family reports pt started having weakness in both legs yesterday along with a generalized weakness.  Pt saw her pmd for a cough yesterday and was given several meds for same.  Family felt her speech was "slower than normal" not necessarily slurred.  Pt also has c/o sob.

## 2016-12-05 NOTE — ED Provider Notes (Signed)
[facility] DEPT Provider Note   CSN: [id number] Arrival date & time: 12/05/16  2307  By signing my name below, I, Margit Banda, attest that this documentation has been prepared under the direction and in the presence of Ripley Fraise, MD. Electronically Signed: Margit Banda, ED Scribe. 12/05/16. 11:36 PM.   History   Chief Complaint Chief Complaint  Patient presents with  . Weakness    HPI Kari [name] is a 83 y.o. female who presents to the Emergency Department complaining of a sudden, gradually worsening fever that started on 12/04/16. Associated sx include SOB, weakness and cough. Per pt's daughter, she went to urgent care yesterday evening (12/04/16). She was given medication to treat sx. This morning pt seemed weaker to family and that her speech was "slower than normal". Pt lives with her husband and son. Pt denies HA, diarrhea, nausea, vomiting, rash, sore throat and falling.    The history is provided by the patient and a relative. No language interpreter was used.  Weakness  This is a new problem. The current episode started 12 to 24 hours ago. The problem has been gradually worsening. There was left lower extremity focality noted. The maximum temperature recorded prior to her arrival was 100 to 100.9 F. The fever has been present for 1 to 2 days. Associated symptoms include shortness of breath. Pertinent negatives include no vomiting and no headaches.    Past Medical History:  Diagnosis Date  . Adenomatous polyps   . AF (atrial fibrillation) ([place])   . Cataract   . Gallstones   . Hx of adenomatous colonic polyps   . Hyperlipidemia    x5 years  . Hypertension    x5 years  . Kyphosis   . Metabolic syndrome X   . Obesity   . Osteoporosis   . Symptomatic menopausal or female climacteric states   . Vitamin D deficiency     Patient Active Problem List   Diagnosis Date Noted  . Sciatica of right side 04/23/2016  . At high risk for falls 04/23/2016  .  Increased BMI 09/30/2015  . Hyperlipidemia 09/07/2015  . Low back pain 09/07/2015  . Hypoxia 09/06/2015  . Orthostatic syncope 09/04/2015  . Orthostatic hypotension 09/04/2015  . Thoracic aortic atherosclerosis ([place]) 05/03/2015  . Vitamin D deficiency 02/02/2015  . Pre-diabetes 02/02/2015  . Metabolic syndrome 78/93/8101  . Osteoporosis 03/11/2013  . Atrial fibrillation, unspecified 11/24/2012  . Hypokalemia 05/04/2012  . Obesity   . Symptomatic menopausal or female climacteric states   . Kyphosis   . Gallstones   . Personal history of adenomatous colonic polyps   . Essential hypertension 03/29/2009    Past Surgical History:  Procedure Laterality Date  . ABDOMINAL HYSTERECTOMY    . BLADDER SUSPENSION    . COLONOSCOPY  multiple  . EYE SURGERY Bilateral 09/2015  . VAGINAL HYSTERECTOMY     total    OB History    No data available       Home Medications    Prior to Admission medications   Medication Sig Start Date End Date Taking? Authorizing Provider  acetaminophen (TYLENOL) 500 MG tablet Take 1,000 mg by mouth every 6 (six) hours as needed.    Historical Provider, MD  atenolol (TENORMIN) 50 MG tablet TAKE (1) TABLET TWICE A DAY. 10/29/16   Chipper Herb, MD  captopril (CAPOTEN) 50 MG tablet TAKE (1) TABLET TWICE A DAY FOR HIGH BLOOD PRESSURE. 09/20/16   Chipper Herb, MD  ezetimibe (ZETIA)  10 MG tablet TAKE 1 TABLET ONCE DAILY FOR CHOLESTEROL 09/20/16   Chipper Herb, MD  meloxicam (MOBIC) 15 MG tablet Take 1 tablet (15 mg total) by mouth daily. Patient taking differently: Take 15 mg by mouth daily as needed.  03/22/16   Mary-Margaret Hassell Done, FNP  potassium chloride (K-DUR) 10 MEQ tablet Take 1 tablet (10 mEq total) by mouth daily. 10/03/16   Chipper Herb, MD  pravastatin (PRAVACHOL) 80 MG tablet TAKE 1 TABLET ONCE DAILY FOR CHOLESTEROL 09/20/16   Chipper Herb, MD  Vitamin D, Ergocalciferol, (DRISDOL) 50000 units CAPS capsule Take 1 capsule (50,000 Units total) by  mouth every 7 (seven) days. 12/03/16   Claretta Fraise, MD  warfarin (COUMADIN) 2.5 MG tablet TAKE 1 TABLET ONCE DAILY OR AS DIRECTED 10/05/16   Chipper Herb, MD    Family History Family History  Problem Relation Age of Onset  . Heart attack Mother   . Stroke Mother   . Heart attack Father   . Alcohol abuse Father   . Stroke Brother   . Heart attack Brother   . Depression Brother     suicide  . Heart attack Other   . Stroke Other   . Breast cancer Other   . Colon cancer Neg Hx     Social History Social History  Substance Use Topics  . Smoking status: Never Smoker  . Smokeless tobacco: Never Used  . Alcohol use No     Allergies   Erythromycin and Penicillins   Review of Systems Review of Systems  Constitutional: Positive for fatigue and fever.  HENT: Negative for sore throat.   Respiratory: Positive for cough and shortness of breath.   Gastrointestinal: Negative for diarrhea, nausea and vomiting.  Skin: Negative for rash.  Neurological: Positive for weakness. Negative for headaches.  All other systems reviewed and are negative.    Physical Exam Updated Vital Signs BP (!) 146/67 (BP Location: Right Arm)   Pulse (!) 116   Temp (!) 102.2 F (39 C) (Oral)   Resp 18   Ht 5\' 1"  (1.549 m)   Wt 150 lb (68 kg)   SpO2 96%   BMI 28.34 kg/m   Physical Exam  CONSTITUTIONAL: Elderly, Ill appearing HEAD: Normocephalic/atraumatic EYES: EOMI/PERRL ENMT: Mucous membranes dry NECK: supple no meningeal signs SPINE/BACK:entire spine nontender CV: tachycardic and irregular LUNGS: tachypneic and wheezing bilaterally ABDOMEN: soft, nontender, no rebound or guarding, bowel sounds noted throughout abdomen GU:no cva tenderness NEURO: Pt is awake/alert/appropriate, moves all extremitiesx4.  No facial droop.   EXTREMITIES: pulses normal/equal, full ROM SKIN: warm, color normal PSYCH: no abnormalities of mood noted, alert and oriented to situation  ED Treatments / Results    DIAGNOSTIC STUDIES: Oxygen Saturation is 96% on RA, adequate by my interpretation.   COORDINATION OF CARE: 11:32 PM-Discussed next steps with pt which include being admitted. Pt verbalized understanding and is agreeable with the plan.    Labs (all labs ordered are listed, but only abnormal results are displayed) Labs Reviewed  COMPREHENSIVE METABOLIC PANEL - Abnormal; Notable for the following:       Result Value   Sodium 132 (*)    Potassium 3.1 (*)    Chloride 98 (*)    Glucose, Bld 132 (*)    ALT 13 (*)    All other components within normal limits  CBC WITH DIFFERENTIAL/PLATELET - Abnormal; Notable for the following:    MCH 24.8 (*)    RDW 16.7 (*)  All other components within normal limits  URINALYSIS, ROUTINE W REFLEX MICROSCOPIC - Abnormal; Notable for the following:    APPearance HAZY (*)    Hgb urine dipstick LARGE (*)    Protein, ur 100 (*)    Leukocytes, UA MODERATE (*)    Squamous Epithelial / LPF 6-30 (*)    All other components within normal limits  PROTIME-INR - Abnormal; Notable for the following:    Prothrombin Time 21.3 (*)    All other components within normal limits  INFLUENZA PANEL BY PCR (TYPE A & B) - Abnormal; Notable for the following:    Influenza A By PCR POSITIVE (*)    All other components within normal limits  CULTURE, BLOOD (ROUTINE X 2)  CULTURE, BLOOD (ROUTINE X 2)  I-STAT CG4 LACTIC ACID, ED    EKG  EKG Interpretation  Date/Time:  Wednesday December 05 2016 23:31:07 EDT Ventricular Rate:  141 PR Interval:    QRS Duration: 71 QT Interval:  303 QTC Calculation: 464 R Axis:   89 Text Interpretation:  Atrial fibrillation with rapid V-rate Borderline right axis deviation Anteroseptal infarct, old Repolarization abnormality, prob rate related Abnormal ekg Confirmed by Christy Gentles  MD, Ilian Wessell ([id number]) on 12/05/2016 11:39:03 PM       Radiology Dg Chest Port 1 View  Result Date: 12/06/2016 CLINICAL DATA:  Cough and congestion EXAM:  PORTABLE CHEST 1 VIEW COMPARISON:  09/06/2015 FINDINGS: Mild cardiomegaly with atherosclerosis. Slight central congestion. Mild bibasilar atelectasis. No pneumothorax. IMPRESSION: 1. There is mild bibasilar atelectasis 2. There is cardiomegaly with mild central congestion Electronically Signed   By: Donavan Foil M.D.   On: 12/06/2016 00:09    Procedures Procedures  CRITICAL CARE Performed by: Sharyon Cable Total critical care time: 40 minutes Critical care time was exclusive of separately billable procedures and treating other patients. Critical care was necessary to treat or prevent imminent or life-threatening deterioration. Critical care was time spent personally by me on the following activities: development of treatment plan with patient and/or surrogate as well as nursing, discussions with consultants, evaluation of patient's response to treatment, examination of patient, obtaining history from patient or surrogate, ordering and performing treatments and interventions, ordering and review of laboratory studies, ordering and review of radiographic studies, pulse oximetry and re-evaluation of patient's condition. PATIENT WITH INFLUENZA, DYSPNEA, RESPIRATORY FAILURE, AND ALSO WITH ATRIAL FIBRILLATION WITH RVR PT WILL NEED ADMISSION HEART RATE >150 AND LIKELY NEED CARDIZEM DRIP  Medications Ordered in ED Medications  levofloxacin (LEVAQUIN) IVPB 750 mg (750 mg Intravenous New Bag/Given 12/05/16 2349)  sodium chloride 0.9 % bolus 1,000 mL (0 mLs Intravenous Stopped 12/06/16 0029)    And  sodium chloride 0.9 % bolus 1,000 mL (1,000 mLs Intravenous Rate/Dose Change 12/06/16 0056)  vancomycin (VANCOCIN) IVPB 1000 mg/200 mL premix (0 mg Intravenous Stopped 12/06/16 0054)  acetaminophen (TYLENOL) tablet 650 mg (650 mg Oral Given 12/05/16 2354)  albuterol (PROVENTIL) (2.5 MG/3ML) 0.083% nebulizer solution 5 mg (5 mg Nebulization Given 12/06/16 0004)  oseltamivir (TAMIFLU) capsule 75 mg (75 mg Oral Given  12/06/16 0050)     Initial Impression / Assessment and Plan / ED Course  I have reviewed the triage vital signs and the nursing notes.  Pertinent labs & imaging results that were available during my care of the patient were reviewed by me and considered in my medical decision making (see chart for details).     1:03 AM Pt ill appearing She has influenza with associated tachypnea/wheezing She will need  admission She will also likely need cardizem drip for atrial fibrillation with RVR  tamiflu ordered D/w dr Darrick Meigs for admission Pt reports she is improved but will need to be closely monitored  Final Clinical Impressions(s) / ED Diagnoses   Final diagnoses:  Influenza A  Atrial fibrillation with rapid ventricular response (HCC)  Dehydration  Acute respiratory failure with hypoxia ([place])    New Prescriptions New Prescriptions   No medications on file   I personally performed the services described in this documentation, which was scribed in my presence. The recorded information has been reviewed and is accurate.       Ripley Fraise, MD 12/06/16 [phone]

## 2016-12-06 DIAGNOSIS — I4891 Unspecified atrial fibrillation: Secondary | ICD-10-CM | POA: Diagnosis not present

## 2016-12-06 DIAGNOSIS — E876 Hypokalemia: Secondary | ICD-10-CM | POA: Diagnosis not present

## 2016-12-06 DIAGNOSIS — I10 Essential (primary) hypertension: Secondary | ICD-10-CM | POA: Diagnosis not present

## 2016-12-06 DIAGNOSIS — J101 Influenza due to other identified influenza virus with other respiratory manifestations: Secondary | ICD-10-CM | POA: Diagnosis not present

## 2016-12-06 LAB — URINALYSIS, ROUTINE W REFLEX MICROSCOPIC
BACTERIA UA: NONE SEEN
BILIRUBIN URINE: NEGATIVE
Glucose, UA: NEGATIVE mg/dL
Ketones, ur: NEGATIVE mg/dL
NITRITE: NEGATIVE
PH: 6 (ref 5.0–8.0)
Protein, ur: 100 mg/dL — AB
SPECIFIC GRAVITY, URINE: 1.015 (ref 1.005–1.030)

## 2016-12-06 LAB — COMPREHENSIVE METABOLIC PANEL
ALK PHOS: 63 U/L (ref 38–126)
ALT: 11 U/L — AB (ref 14–54)
ALT: 13 U/L — AB (ref 14–54)
ANION GAP: 8 (ref 5–15)
AST: 23 U/L (ref 15–41)
AST: 23 U/L (ref 15–41)
Albumin: 3.1 g/dL — ABNORMAL LOW (ref 3.5–5.0)
Albumin: 3.7 g/dL (ref 3.5–5.0)
Alkaline Phosphatase: 50 U/L (ref 38–126)
Anion gap: 9 (ref 5–15)
BILIRUBIN TOTAL: 0.6 mg/dL (ref 0.3–1.2)
BUN: 10 mg/dL (ref 6–20)
BUN: 12 mg/dL (ref 6–20)
CHLORIDE: 104 mmol/L (ref 101–111)
CO2: 25 mmol/L (ref 22–32)
CO2: 25 mmol/L (ref 22–32)
CREATININE: 0.66 mg/dL (ref 0.44–1.00)
CREATININE: 0.66 mg/dL (ref 0.44–1.00)
Calcium: 8.3 mg/dL — ABNORMAL LOW (ref 8.9–10.3)
Calcium: 8.9 mg/dL (ref 8.9–10.3)
Chloride: 98 mmol/L — ABNORMAL LOW (ref 101–111)
GFR calc Af Amer: >60 mL/min (ref >60)
GLUCOSE: 132 mg/dL — AB (ref 65–99)
Glucose, Bld: 103 mg/dL — ABNORMAL HIGH (ref 65–99)
POTASSIUM: 3.2 mmol/L — AB (ref 3.5–5.1)
Potassium: 3.1 mmol/L — ABNORMAL LOW (ref 3.5–5.1)
SODIUM: 137 mmol/L (ref 135–145)
Sodium: 132 mmol/L — ABNORMAL LOW (ref 135–145)
TOTAL PROTEIN: 8.1 g/dL (ref 6.5–8.1)
Total Bilirubin: 0.5 mg/dL (ref 0.3–1.2)
Total Protein: 6.5 g/dL (ref 6.5–8.1)

## 2016-12-06 LAB — CBC
HCT: 34.3 % — ABNORMAL LOW (ref 36.0–46.0)
Hemoglobin: 10.5 g/dL — ABNORMAL LOW (ref 12.0–15.0)
MCH: 24.5 pg — AB (ref 26.0–34.0)
MCHC: 30.6 g/dL (ref 30.0–36.0)
MCV: 80.1 fL (ref 78.0–100.0)
PLATELETS: 194 10*3/uL (ref 150–400)
RBC: 4.28 MIL/uL (ref 3.87–5.11)
RDW: 16.9 % — AB (ref 11.5–15.5)
WBC: 5.7 10*3/uL (ref 4.0–10.5)

## 2016-12-06 LAB — INFLUENZA PANEL BY PCR (TYPE A & B)
Influenza A By PCR: POSITIVE — AB
Influenza B By PCR: NEGATIVE

## 2016-12-06 LAB — PROTIME-INR
INR: 1.82
Prothrombin Time: 21.3 seconds — ABNORMAL HIGH (ref 11.4–15.2)

## 2016-12-06 LAB — MRSA PCR SCREENING: MRSA by PCR: NEGATIVE

## 2016-12-06 MED ORDER — ONDANSETRON HCL 4 MG PO TABS: 4.0000 mg | ORAL_TABLET | Freq: Four times a day (QID) | ORAL | Status: DC | PRN

## 2016-12-06 MED ORDER — WARFARIN SODIUM 5 MG PO TABS: 5.0000 mg | ORAL_TABLET | Freq: Once | ORAL | Status: DC

## 2016-12-06 MED ORDER — OSELTAMIVIR PHOSPHATE 75 MG PO CAPS: 75.0000 mg | ORAL_CAPSULE | Freq: Two times a day (BID) | ORAL | Status: DC

## 2016-12-06 MED ORDER — OSELTAMIVIR PHOSPHATE 30 MG PO CAPS
30.0000 mg | ORAL_CAPSULE | Freq: Two times a day (BID) | ORAL | Status: DC
Start: 2016-12-07 — End: 2016-12-06

## 2016-12-06 MED ORDER — WARFARIN - PHARMACIST DOSING INPATIENT: Status: DC

## 2016-12-06 MED ORDER — POTASSIUM CHLORIDE 10 MEQ/100ML IV SOLN
10.0000 meq | INTRAVENOUS | Status: AC
  Administered 2016-12-06 (×3): 10 meq via INTRAVENOUS
  Filled 2016-12-06 (×3): qty 100

## 2016-12-06 MED ORDER — ONDANSETRON HCL 4 MG/2ML IJ SOLN: 4.0000 mg | Freq: Four times a day (QID) | INTRAMUSCULAR | Status: DC | PRN

## 2016-12-06 MED ORDER — IPRATROPIUM BROMIDE 0.02 % IN SOLN
RESPIRATORY_TRACT | Status: AC
  Filled 2016-12-06: qty 2.5

## 2016-12-06 MED ORDER — LEVALBUTEROL HCL 0.63 MG/3ML IN NEBU
0.6300 mg | INHALATION_SOLUTION | Freq: Four times a day (QID) | RESPIRATORY_TRACT | Status: DC
  Administered 2016-12-06 (×3): 0.63 mg via RESPIRATORY_TRACT
  Filled 2016-12-06 (×2): qty 3

## 2016-12-06 MED ORDER — IPRATROPIUM BROMIDE 0.02 % IN SOLN
0.5000 mg | Freq: Four times a day (QID) | RESPIRATORY_TRACT | Status: DC
  Administered 2016-12-06 (×3): 0.5 mg via RESPIRATORY_TRACT
  Filled 2016-12-06 (×2): qty 2.5

## 2016-12-06 MED ORDER — SODIUM CHLORIDE 0.9 % IV SOLN: INTRAVENOUS | Status: DC

## 2016-12-06 MED ORDER — OSELTAMIVIR PHOSPHATE 75 MG PO CAPS
75.0000 mg | ORAL_CAPSULE | Freq: Once | ORAL | Status: AC
  Administered 2016-12-06: 75 mg via ORAL
  Filled 2016-12-06: qty 1

## 2016-12-06 MED ORDER — LEVALBUTEROL HCL 0.63 MG/3ML IN NEBU
INHALATION_SOLUTION | RESPIRATORY_TRACT | Status: AC
  Filled 2016-12-06: qty 3

## 2016-12-06 MED ORDER — ATENOLOL 25 MG PO TABS
50.0000 mg | ORAL_TABLET | Freq: Two times a day (BID) | ORAL | Status: DC
  Administered 2016-12-06 (×2): 50 mg via ORAL
  Filled 2016-12-06 (×2): qty 2

## 2016-12-06 MED ORDER — DILTIAZEM HCL-DEXTROSE 100-5 MG/100ML-% IV SOLN (PREMIX)
5.0000 mg/h | INTRAVENOUS | Status: DC
  Administered 2016-12-06: 5 mg/h via INTRAVENOUS
  Filled 2016-12-06: qty 100

## 2016-12-06 MED ORDER — ACETAMINOPHEN 325 MG PO TABS: 650.0000 mg | ORAL_TABLET | Freq: Four times a day (QID) | ORAL | Status: DC | PRN

## 2016-12-06 MED ORDER — POTASSIUM CHLORIDE CRYS ER 20 MEQ PO TBCR
40.0000 meq | EXTENDED_RELEASE_TABLET | Freq: Once | ORAL | Status: AC
  Administered 2016-12-06: 40 meq via ORAL
  Filled 2016-12-06: qty 2

## 2016-12-06 MED ORDER — EZETIMIBE 10 MG PO TABS
10.0000 mg | ORAL_TABLET | Freq: Every day | ORAL | Status: DC
  Administered 2016-12-06: 10 mg via ORAL
  Filled 2016-12-06: qty 1

## 2016-12-06 MED ORDER — PRAVASTATIN SODIUM 40 MG PO TABS
80.0000 mg | ORAL_TABLET | Freq: Every day | ORAL | Status: DC
  Administered 2016-12-06: 80 mg via ORAL
  Filled 2016-12-06: qty 2

## 2016-12-06 MED ORDER — ACETAMINOPHEN 650 MG RE SUPP: 650.0000 mg | Freq: Four times a day (QID) | RECTAL | Status: DC | PRN

## 2016-12-06 MED ORDER — OSELTAMIVIR PHOSPHATE 30 MG PO CAPS: 30.0000 mg | ORAL_CAPSULE | Freq: Two times a day (BID) | ORAL | 0 refills | Status: DC

## 2016-12-06 NOTE — Progress Notes (Signed)
PHARMACY NOTE:  ANTIMICROBIAL RENAL DOSAGE ADJUSTMENT  Current antimicrobial regimen includes a mismatch between antimicrobial dosage and estimated renal function.  As per policy approved by the Pharmacy & Therapeutics and Medical Executive Committees, the antimicrobial dosage will be adjusted accordingly.  Current antimicrobial dosage:  Tamiflu 75mg  po BID  Indication: influenza:  83yo female with clcr < 36ml/min  Renal Function: Estimated Creatinine Clearance: 50.5 mL/min (by C-G formula based on SCr of 0.66 mg/dL).    Antimicrobial dosage has been changed to:  Tamiflu 30mg  po BID for clcr < 60   Additional comments:  Monitor progress, labs  Thank you for allowing pharmacy to be a part of this patient's care.  Ena Dawley, [facility] 12/06/2016 7:44 AM

## 2016-12-06 NOTE — H&P (Signed)
[facility] H&P    Patient Demographics:    Kari [name], is a 83 y.o. female  MRN: [id number]  DOB - 07/15/1934  Admit Date - 12/05/2016  Referring MD/NP/PA: Dr. Christy Gentles  Outpatient Primary MD for the patient is Redge Gainer, MD  Patient coming from: Home  Chief Complaint  Patient presents with  . Weakness      HPI:    Kari [name]  is a 83 y.o. female, With history of atrial fibrillation, hypertension, hyperlipidemia who was brought to the ED for gradually worsening fever that started on 12/04/2016. Patient also had shortness of breath and weakness. She was seen at urgent care yesterday and was provided symptomatic treatment. This morning patient seemed more weak family and speech was slower than normal. She was brought to hospital for further evaluation.  In the ED, she was found to have temperature 102.2. Influenza A PCR was positive, and patient given 1 dose of Tamiflu. She also received empiric vancomycin and Levaquin for possible pneumonia, chest x-ray shows no infiltrate. Lactic acid is 1.27  Also patient found to be in A. fib with RVR, heart rate 150s. Patient is on Coumadin for anticoagulation.  She denies chest pain, no nausea vomiting or diarrhea. No dysuria.    Review of systems:    In addition to the HPI above,  No Fever-chills, No Headache, No changes with Vision or hearing, No problems swallowing food or Liquids, No Abdominal pain, No Nausea or Vomiting, bowel movements are regular, No Blood in stool or Urine, No dysuria, No new skin rashes or bruises, No new joints pains-aches,  No new weakness, tingling, numbness in any extremity, No recent weight gain or loss, No polyuria, polydypsia or polyphagia, No significant Mental Stressors.  A full 10 point Review of Systems was done, except as stated above, all other Review of Systems were negative.   With Past History of the following :      Past Medical History:  Diagnosis Date  . Adenomatous polyps   . AF (atrial fibrillation) ([place])   . Cataract   . Gallstones   . Hx of adenomatous colonic polyps   . Hyperlipidemia    x5 years  . Hypertension    x5 years  . Kyphosis   . Metabolic syndrome X   . Obesity   . Osteoporosis   . Symptomatic menopausal or female climacteric states   . Vitamin D deficiency       Past Surgical History:  Procedure Laterality Date  . ABDOMINAL HYSTERECTOMY    . BLADDER SUSPENSION    . COLONOSCOPY  multiple  . EYE SURGERY Bilateral 09/2015  . VAGINAL HYSTERECTOMY     total      Social History:      Social History  Substance Use Topics  . Smoking status: Never Smoker  . Smokeless tobacco: Never Used  . Alcohol use No       Family History :     Family History  Problem Relation Age of Onset  . Heart attack Mother   .  Stroke Mother   . Heart attack Father   . Alcohol abuse Father   . Stroke Brother   . Heart attack Brother   . Depression Brother     suicide  . Heart attack Other   . Stroke Other   . Breast cancer Other   . Colon cancer Neg Hx       Home Medications:   Prior to Admission medications   Medication Sig Start Date End Date Taking? Authorizing Provider  acetaminophen (TYLENOL) 500 MG tablet Take 1,000 mg by mouth every 6 (six) hours as needed.    Historical Provider, MD  atenolol (TENORMIN) 50 MG tablet TAKE (1) TABLET TWICE A DAY. 10/29/16   Chipper Herb, MD  captopril (CAPOTEN) 50 MG tablet TAKE (1) TABLET TWICE A DAY FOR HIGH BLOOD PRESSURE. 09/20/16   Chipper Herb, MD  ezetimibe (ZETIA) 10 MG tablet TAKE 1 TABLET ONCE DAILY FOR CHOLESTEROL 09/20/16   Chipper Herb, MD  meloxicam (MOBIC) 15 MG tablet Take 1 tablet (15 mg total) by mouth daily. Patient taking differently: Take 15 mg by mouth daily as needed.  03/22/16   Mary-Margaret Hassell Done, FNP  potassium chloride (K-DUR) 10 MEQ tablet Take 1 tablet (10 mEq total) by mouth daily. 10/03/16    Chipper Herb, MD  pravastatin (PRAVACHOL) 80 MG tablet TAKE 1 TABLET ONCE DAILY FOR CHOLESTEROL 09/20/16   Chipper Herb, MD  Vitamin D, Ergocalciferol, (DRISDOL) 50000 units CAPS capsule Take 1 capsule (50,000 Units total) by mouth every 7 (seven) days. 12/03/16   Claretta Fraise, MD  warfarin (COUMADIN) 2.5 MG tablet TAKE 1 TABLET ONCE DAILY OR AS DIRECTED 10/05/16   Chipper Herb, MD     Allergies:     Allergies  Allergen Reactions  . Erythromycin Swelling and Other (See Comments)    Reaction unknown  . Penicillins Other (See Comments)    Reaction unknown  Has patient had a PCN reaction causing immediate rash, facial/tongue/throat swelling, SOB or lightheadedness with hypotension: unknown Has patient had a PCN reaction causing severe rash involving mucus membranes or skin necrosis: unknown Has patient had a PCN reaction that required hospitalization : unknown Has patient had a PCN reaction occurring within the last 10 years: no If all of the above answers are "NO", then may proceed with Cephalosporin use.      Physical Exam:   Vitals  Blood pressure 110/74, pulse (!) 101, temperature (!) 102.2 F (39 C), temperature source Oral, resp. rate (!) 23, height 5\' 1"  (1.549 m), weight 68 kg (150 lb), SpO2 93 %.  1.  General: Appears in no acute distress  2. Psychiatric:  Intact judgement and  insight, awake alert, oriented x 3.  3. Neurologic: No focal neurological deficits, all cranial nerves intact.Strength 5/5 all 4 extremities, sensation intact all 4 extremities, plantars down going.  4. Eyes :  anicteric sclerae, moist conjunctivae with no lid lag. PERRLA.  5. ENMT:  Oropharynx clear with moist mucous membranes and good dentition  6. Neck:  supple, no cervical lymphadenopathy appriciated, No thyromegaly  7. Respiratory : Normal respiratory effort, bilateral rhonchi auscultated.  8. Cardiovascular : RRR, no gallops, rubs or murmurs, no leg edema  9.  Gastrointestinal:  Positive bowel sounds, abdomen soft, non-tender to palpation,no hepatosplenomegaly, no rigidity or guarding       10. Skin:  No cyanosis, normal texture and turgor, no rash, lesions or ulcers  11.Musculoskeletal:  Good muscle tone,  joints appear normal ,  no effusions,  normal range of motion    Data Review:    CBC  Recent Labs Lab 12/05/16 2331  WBC 8.7  HGB 12.1  HCT 38.5  PLT 223  MCV 79.1  MCH 24.8*  MCHC 31.4  RDW 16.7*  LYMPHSABS 0.9  MONOABS 0.7  EOSABS 0.0  BASOSABS 0.0   ------------------------------------------------------------------------------------------------------------------  Chemistries   Recent Labs Lab 12/05/16 2331  NA 132*  K 3.1*  CL 98*  CO2 25  GLUCOSE 132*  BUN 12  CREATININE 0.66  CALCIUM 8.9  AST 23  ALT 13*  ALKPHOS 63  BILITOT 0.6   ------------------------------------------------------------------------------------------------------------------  ------------------------------------------------------------------------------------------------------------------ GFR: Estimated Creatinine Clearance: 49.5 mL/min (by C-G formula based on SCr of 0.66 mg/dL). Liver Function Tests:  Recent Labs Lab 12/05/16 2331  AST 23  ALT 13*  ALKPHOS 63  BILITOT 0.6  PROT 8.1  ALBUMIN 3.7   No results for input(s): LIPASE, AMYLASE in the last 168 hours. No results for input(s): AMMONIA in the last 168 hours. Coagulation Profile:  Recent Labs Lab 12/05/16 0834 12/05/16 2331  INR 2.1* 1.82     --------------------------------------------------------------------------------------------------------------- Urine analysis:    Component Value Date/Time   COLORURINE YELLOW 12/06/2016 0015   APPEARANCEUR HAZY (A) 12/06/2016 0015   APPEARANCEUR Clear 11/06/2016 1108   LABSPEC 1.015 12/06/2016 0015   PHURINE 6.0 12/06/2016 0015   GLUCOSEU NEGATIVE 12/06/2016 0015   HGBUR LARGE (A) 12/06/2016 0015   BILIRUBINUR  NEGATIVE 12/06/2016 0015   BILIRUBINUR Negative 11/06/2016 1108   KETONESUR NEGATIVE 12/06/2016 0015   PROTEINUR 100 (A) 12/06/2016 0015   NITRITE NEGATIVE 12/06/2016 0015   LEUKOCYTESUR MODERATE (A) 12/06/2016 0015   LEUKOCYTESUR 3+ (A) 11/06/2016 1108      Imaging Results:    Dg Chest Port 1 View  Result Date: 12/06/2016 CLINICAL DATA:  Cough and congestion EXAM: PORTABLE CHEST 1 VIEW COMPARISON:  09/06/2015 FINDINGS: Mild cardiomegaly with atherosclerosis. Slight central congestion. Mild bibasilar atelectasis. No pneumothorax. IMPRESSION: 1. There is mild bibasilar atelectasis 2. There is cardiomegaly with mild central congestion Electronically Signed   By: Donavan Foil M.D.   On: 12/06/2016 00:09    My personal review of EKG: Rhythm - atrial fibrillation with RVR   Assessment & Plan:    Active Problems:   Essential hypertension   Hypokalemia   Atrial fibrillation (HCC)   Influenza A   1. Influenza A- patient was started on Tamiflu 75 mg by mouth twice a day, chest x-ray shows no infiltrate. We'll start Xopenex and ipratropium nebulizer every 6 hours. 2. Atrial fibrillation with RVR- patient takes atenolol 50 mg twice a day at home, will continue with atenolol and also start Cardizem drip, 5-15 mg per hour, titrate to keep heart rate less than 100. Continue Coumadin per pharmacy 3. Hypokalemia- potassium is 3.1, will start KCl 10 mg IV 3. 4. Hyperlipidemia-continue Pravachol, Zetia 5. Hypertension-continue atenolol, hold captopril.   DVT Prophylaxis-   Patient on warfarin  AM Labs Ordered, also please review Full Orders  Family Communication: Admission, patients condition and plan of care including tests being ordered have been discussed with the patient and her daughter at bedside* who indicate understanding and agree with the plan and Code Status.  Code Status:  DO NOT RESUSCITATE  Admission status: Observation  Time spent in minutes : 60 minutes   Naveh Rickles S  M.D on 12/06/2016 at 1:24 AM  Between 7am to 7pm - Pager - [phone]. After 7pm go to [email or web address] - password  [facility] - Office  [phone]

## 2016-12-06 NOTE — Progress Notes (Signed)
ANTICOAGULATION CONSULT NOTE - Initial Consult  Pharmacy Consult for Coumadin (home med) Indication: atrial fibrillation  Allergies  Allergen Reactions  . Erythromycin Swelling and Other (See Comments)    Reaction unknown  . Penicillins Other (See Comments)    Reaction unknown  Has patient had a PCN reaction causing immediate rash, facial/tongue/throat swelling, SOB or lightheadedness with hypotension: unknown Has patient had a PCN reaction causing severe rash involving mucus membranes or skin necrosis: unknown Has patient had a PCN reaction that required hospitalization : unknown Has patient had a PCN reaction occurring within the last 10 years: no If all of the above answers are "NO", then may proceed with Cephalosporin use.    Patient Measurements: Height: 5\' 1"  (154.9 cm) Weight: 155 lb 13.8 oz (70.7 kg) IBW/kg (Calculated) : 47.8  Vital Signs: Temp: 97.5 F (36.4 C) (04/05 0728) Temp Source: Axillary (04/05 0728) BP: 99/54 (04/05 0800) Pulse Rate: 68 (04/05 0900)  Labs:  Recent Labs  12/05/16 0834 12/05/16 2331 12/06/16 0520  HGB  --  12.1 10.5*  HCT  --  38.5 34.3*  PLT  --  223 194  LABPROT  --  21.3*  --   INR 2.1* 1.82  --   CREATININE  --  0.66 0.66   Estimated Creatinine Clearance: 50.5 mL/min (by C-G formula based on SCr of 0.66 mg/dL).  Medical History: Past Medical History:  Diagnosis Date  . Adenomatous polyps   . AF (atrial fibrillation) ([place])   . Cataract   . Gallstones   . Hx of adenomatous colonic polyps   . Hyperlipidemia    x5 years  . Hypertension    x5 years  . Kyphosis   . Metabolic syndrome X   . Obesity   . Osteoporosis   . Symptomatic menopausal or female climacteric states   . Vitamin D deficiency    Medications:  Prescriptions Prior to Admission  Medication Sig Dispense Refill Last Dose  . acetaminophen (TYLENOL) 500 MG tablet Take 1,000 mg by mouth every 6 (six) hours as needed.   unknown  . atenolol (TENORMIN) 50 MG  tablet TAKE (1) TABLET TWICE A DAY. 30 tablet 11 12/05/2016 at 0900  . captopril (CAPOTEN) 50 MG tablet TAKE (1) TABLET TWICE A DAY FOR HIGH BLOOD PRESSURE. 60 tablet 5 12/05/2016 at Unknown time  . ezetimibe (ZETIA) 10 MG tablet TAKE 1 TABLET ONCE DAILY FOR CHOLESTEROL 30 tablet 5 12/05/2016 at Unknown time  . meloxicam (MOBIC) 15 MG tablet Take 1 tablet (15 mg total) by mouth daily. (Patient taking differently: Take 15 mg by mouth daily as needed. ) 30 tablet 3 unknown  . potassium chloride (K-DUR) 10 MEQ tablet Take 1 tablet (10 mEq total) by mouth daily. 30 tablet 5 12/05/2016 at Unknown time  . pravastatin (PRAVACHOL) 80 MG tablet TAKE 1 TABLET ONCE DAILY FOR CHOLESTEROL 30 tablet 5 12/05/2016 at Unknown time  . Vitamin D, Ergocalciferol, (DRISDOL) 50000 units CAPS capsule Take 1 capsule (50,000 Units total) by mouth every 7 (seven) days. 12 capsule 0 Past Week at Unknown time  . warfarin (COUMADIN) 2.5 MG tablet TAKE 1 TABLET ONCE DAILY OR AS DIRECTED 30 tablet 6 12/05/2016 at 0900   Assessment: 83yo female on chronic Coumadin for h/o afib.  INR below goal on admission.   Anticoagulation Dose Instructions as of 12/05/2016    Total Sun Mon Tue Wed Thu Fri Sat  New Dose 15 mg 2.5 mg 1.25 mg 2.5 mg 2.5 mg 2.5 mg  1.25 mg 2.5 mg    (2.5 mg x 1) (2.5 mg x 0.5) (2.5 mg x 1) (2.5 mg x 1) (2.5 mg x 1) (2.5 mg x 0.5) (2.5 mg x 1)   Goal of Therapy:  INR 2-3 Monitor platelets by anticoagulation protocol: Yes   Plan:  Coumadin 5mg  x 1 today (dose increase to boost INR) INR daily  [place] Crane, Wanya Bangura A 12/06/2016,10:36 AM

## 2016-12-06 NOTE — Discharge Summary (Signed)
Physician Discharge Summary  JOCELINE HINCHCLIFF PXT:[id number] DOB: 1934-06-19 DOA: 12/05/2016  PCP: Redge Gainer, MD  Admit date: 12/05/2016 Discharge date: 12/06/2016  Time spent: 45 minutes  Recommendations for Outpatient Follow-up:  -Will be discharged home today.  -Advised to continue tamiflu and follow up with PCP in 2 weeks.  Discharge Diagnoses:  Active Problems:   Essential hypertension   Hypokalemia   Atrial fibrillation ([place])   Influenza A   Discharge Condition: Stable and improved  Filed Weights   12/05/16 2313 12/06/16 0230  Weight: 68 kg (150 lb) 70.7 kg (155 lb 13.8 oz)    History of present illness:  As per Dr. Darrick Meigs on 4/5: Kari [name]  is a 83 y.o. female, With history of atrial fibrillation, hypertension, hyperlipidemia who was brought to the ED for gradually worsening fever that started on 12/04/2016. Patient also had shortness of breath and weakness. She was seen at urgent care yesterday and was provided symptomatic treatment. This morning patient seemed more weak family and speech was slower than normal. She was brought to hospital for further evaluation.  In the ED, she was found to have temperature 102.2. Influenza A PCR was positive, and patient given 1 dose of Tamiflu. She also received empiric vancomycin and Levaquin for possible pneumonia, chest x-ray shows no infiltrate. Lactic acid is 1.27  Also patient found to be in A. fib with RVR, heart rate 150s. Patient is on Coumadin for anticoagulation.  She denies chest pain, no nausea vomiting or diarrhea. No dysuria.  Hospital Course:   Influenza A -Continue tamiiflu. -Advised rest, fluids and symptomatic management.  A Fib with RVR -Required a cardizem drip transiently. -Suspect RVR due to influenza illness. -Continue atenolol and warfarin.  Rest of chronic medical issues have been stable during this short hospitalization.  Procedures:  None   Consultations:  None  Discharge  Instructions  Discharge Instructions    Diet - low sodium heart healthy    Complete by:  As directed    Increase activity slowly    Complete by:  As directed      Allergies as of 12/06/2016      Reactions   Erythromycin Swelling, Other (See Comments)   Reaction unknown   Penicillins Other (See Comments)   Reaction unknown  Has patient had a PCN reaction causing immediate rash, facial/tongue/throat swelling, SOB or lightheadedness with hypotension: unknown Has patient had a PCN reaction causing severe rash involving mucus membranes or skin necrosis: unknown Has patient had a PCN reaction that required hospitalization : unknown Has patient had a PCN reaction occurring within the last 10 years: no If all of the above answers are "NO", then may proceed with Cephalosporin use.      Medication List    TAKE these medications   acetaminophen 500 MG tablet Commonly known as:  TYLENOL Take 1,000 mg by mouth every 6 (six) hours as needed.   atenolol 50 MG tablet Commonly known as:  TENORMIN TAKE (1) TABLET TWICE A DAY.   captopril 50 MG tablet Commonly known as:  CAPOTEN TAKE (1) TABLET TWICE A DAY FOR HIGH BLOOD PRESSURE.   ezetimibe 10 MG tablet Commonly known as:  ZETIA TAKE 1 TABLET ONCE DAILY FOR CHOLESTEROL   meloxicam 15 MG tablet Commonly known as:  MOBIC Take 1 tablet (15 mg total) by mouth daily. What changed:  when to take this  reasons to take this   oseltamivir 30 MG capsule Commonly known as:  TAMIFLU  Take 1 capsule (30 mg total) by mouth 2 (two) times daily. Start taking on:  12/07/2016   potassium chloride 10 MEQ tablet Commonly known as:  K-DUR Take 1 tablet (10 mEq total) by mouth daily.   pravastatin 80 MG tablet Commonly known as:  PRAVACHOL TAKE 1 TABLET ONCE DAILY FOR CHOLESTEROL   Vitamin D (Ergocalciferol) 50000 units Caps capsule Commonly known as:  DRISDOL Take 1 capsule (50,000 Units total) by mouth every 7 (seven) days.   warfarin 2.5 MG  tablet Commonly known as:  COUMADIN TAKE 1 TABLET ONCE DAILY OR AS DIRECTED      Allergies  Allergen Reactions  . Erythromycin Swelling and Other (See Comments)    Reaction unknown  . Penicillins Other (See Comments)    Reaction unknown  Has patient had a PCN reaction causing immediate rash, facial/tongue/throat swelling, SOB or lightheadedness with hypotension: unknown Has patient had a PCN reaction causing severe rash involving mucus membranes or skin necrosis: unknown Has patient had a PCN reaction that required hospitalization : unknown Has patient had a PCN reaction occurring within the last 10 years: no If all of the above answers are "NO", then may proceed with Cephalosporin use.    Follow-up Information    Redge Gainer, MD. Schedule an appointment as soon as possible for a visit in 2 week(s).   Specialty:  Family Medicine Contact information: [place] [address] [phone]            The results of significant diagnostics from this hospitalization (including imaging, microbiology, ancillary and laboratory) are listed below for reference.    Significant Diagnostic Studies: Dg Chest Port 1 View  Result Date: 12/06/2016 CLINICAL DATA:  Cough and congestion EXAM: PORTABLE CHEST 1 VIEW COMPARISON:  09/06/2015 FINDINGS: Mild cardiomegaly with atherosclerosis. Slight central congestion. Mild bibasilar atelectasis. No pneumothorax. IMPRESSION: 1. There is mild bibasilar atelectasis 2. There is cardiomegaly with mild central congestion Electronically Signed   By: Donavan Foil M.D.   On: 12/06/2016 00:09    Microbiology: Recent Results (from the past 240 hour(s))  Blood Culture (routine x 2)     Status: None (Preliminary result)   Collection Time: 12/05/16 11:39 PM  Result Value Ref Range Status   Specimen Description RIGHT ANTECUBITAL  Final   Special Requests   Final    BOTTLES DRAWN AEROBIC AND ANAEROBIC Blood Culture adequate volume   Culture NO  GROWTH < 12 HOURS  Final   Report Status PENDING  Incomplete  Blood Culture (routine x 2)     Status: None (Preliminary result)   Collection Time: 12/05/16 11:48 PM  Result Value Ref Range Status   Specimen Description BLOOD LEFT ARM  Final   Special Requests   Final    BOTTLES DRAWN AEROBIC AND ANAEROBIC Blood Culture adequate volume   Culture NO GROWTH < 12 HOURS  Final   Report Status PENDING  Incomplete  MRSA PCR Screening     Status: None   Collection Time: 12/06/16  2:22 AM  Result Value Ref Range Status   MRSA by PCR NEGATIVE NEGATIVE Final    Comment:        The GeneXpert MRSA Assay (FDA approved for NASAL specimens only), is one component of a comprehensive MRSA colonization surveillance program. It is not intended to diagnose MRSA infection nor to guide or monitor treatment for MRSA infections.      Labs: Basic Metabolic Panel:  Recent Labs Lab 12/05/16 2331 12/06/16 0093  NA 132* 137  K 3.1* 3.2*  CL 98* 104  CO2 25 25  GLUCOSE 132* 103*  BUN 12 10  CREATININE 0.66 0.66  CALCIUM 8.9 8.3*   Liver Function Tests:  Recent Labs Lab 12/05/16 2331 12/06/16 0520  AST 23 23  ALT 13* 11*  ALKPHOS 63 50  BILITOT 0.6 0.5  PROT 8.1 6.5  ALBUMIN 3.7 3.1*   No results for input(s): LIPASE, AMYLASE in the last 168 hours. No results for input(s): AMMONIA in the last 168 hours. CBC:  Recent Labs Lab 12/05/16 2331 12/06/16 0520  WBC 8.7 5.7  NEUTROABS 7.1  --   HGB 12.1 10.5*  HCT 38.5 34.3*  MCV 79.1 80.1  PLT 223 194   Cardiac Enzymes: No results for input(s): CKTOTAL, CKMB, CKMBINDEX, TROPONINI in the last 168 hours. BNP: BNP (last 3 results) No results for input(s): BNP in the last 8760 hours.  ProBNP (last 3 results) No results for input(s): PROBNP in the last 8760 hours.  CBG: No results for input(s): GLUCAP in the last 168 hours.     SignedLelon Frohlich  [facility] Pager: [phone] 12/06/2016, 4:54  PM

## 2016-12-10 ENCOUNTER — Telehealth: Payer: Self-pay | Admitting: Family Medicine

## 2016-12-10 LAB — CULTURE, BLOOD (ROUTINE X 2)
CULTURE: NO GROWTH
Culture: NO GROWTH
Special Requests: ADEQUATE
Special Requests: ADEQUATE

## 2016-12-11 ENCOUNTER — Ambulatory Visit (INDEPENDENT_AMBULATORY_CARE_PROVIDER_SITE_OTHER): Payer: Medicare Other | Admitting: Family Medicine

## 2016-12-11 ENCOUNTER — Encounter: Payer: Self-pay | Admitting: Family Medicine

## 2016-12-11 VITALS — BP 152/98 | HR 91 | Temp 98.3°F | Ht 61.0 in | Wt 150.0 lb

## 2016-12-11 DIAGNOSIS — E876 Hypokalemia: Secondary | ICD-10-CM | POA: Diagnosis not present

## 2016-12-11 DIAGNOSIS — I4819 Other persistent atrial fibrillation: Secondary | ICD-10-CM

## 2016-12-11 DIAGNOSIS — I481 Persistent atrial fibrillation: Secondary | ICD-10-CM

## 2016-12-11 DIAGNOSIS — Z09 Encounter for follow-up examination after completed treatment for conditions other than malignant neoplasm: Secondary | ICD-10-CM

## 2016-12-11 DIAGNOSIS — J209 Acute bronchitis, unspecified: Secondary | ICD-10-CM | POA: Diagnosis not present

## 2016-12-11 DIAGNOSIS — I10 Essential (primary) hypertension: Secondary | ICD-10-CM

## 2016-12-11 DIAGNOSIS — R059 Cough, unspecified: Secondary | ICD-10-CM

## 2016-12-11 DIAGNOSIS — R05 Cough: Secondary | ICD-10-CM

## 2016-12-11 DIAGNOSIS — R062 Wheezing: Secondary | ICD-10-CM | POA: Diagnosis not present

## 2016-12-11 LAB — COAGUCHEK XS/INR WAIVED
INR: 2.1 — AB (ref 0.9–1.1)
PROTHROMBIN TIME: 25.3 s

## 2016-12-11 MED ORDER — METHYLPREDNISOLONE ACETATE 80 MG/ML IJ SUSP
60.0000 mg | Freq: Once | INTRAMUSCULAR | Status: AC
  Administered 2016-12-11: 60 mg via INTRAMUSCULAR

## 2016-12-11 MED ORDER — CEFDINIR 300 MG PO CAPS: 300.0000 mg | ORAL_CAPSULE | Freq: Two times a day (BID) | ORAL | 0 refills | Status: DC

## 2016-12-11 NOTE — Addendum Note (Signed)
Addended by: Zannie Cove on: 12/11/2016 11:34 AM   Modules accepted: Orders

## 2016-12-11 NOTE — Addendum Note (Signed)
Addended by: Earlene Plater on: 12/11/2016 11:41 AM   Modules accepted: Orders

## 2016-12-11 NOTE — Patient Instructions (Addendum)
We will call with the results of the CBC and BMP as soon as those results become available The patient is to take the antibiotic as directed, this is Omnicef 300 mg twice daily for 10 days. The patient is allergic to penicillin but has taken cephalexin in the past without problems. She should use the inhaler as directed Use the Symbicort 160/4.5  2 puffs twice a day and rinse mouth after using The patient may need to come back and repeat a ProTime because of taking the antibiotic that is prescribed. We should recheck her again in a couple weeks to make sure she is better. The family should check blood pressure readings at home and bring these readings to the next visit. She should take Mucinex maximum strength, blue and white in color, 1 twice daily with a large glass of water.

## 2016-12-11 NOTE — Progress Notes (Addendum)
Subjective:    Patient ID: Kari [name], female    DOB: [birth year], 83 y.o.   MRN: [id number]  HPI Patient here today for hospital follow up from [facility] on 12/05/16. She was diagnosed with influenza. She was discharged on Tamiflu. She also had a low potassium.. She complains today of congestion which has a yellow and green in appearance and still has a cough and still complains of weakness. She was diagnosed with influenza A. Chest x-ray at the time of her admission to the hospital shows some mild bibasilar atelectasis and cardiomegaly. Her initial blood pressure today was elevated. Her weight is up 150 pounds. She is not running any fever. She has a history of atrial fibrillation and when she went in the hospital she was in atrial fibrillation addition to having the flu. The patient is alert and feeling better but still coughing and congested and weak. She comes to the visit today with her daughter.   Patient Active Problem List   Diagnosis Date Noted  . Influenza A 12/06/2016  . Sciatica of right side 04/23/2016  . At high risk for falls 04/23/2016  . Increased BMI 09/30/2015  . Hyperlipidemia 09/07/2015  . Low back pain 09/07/2015  . Hypoxia 09/06/2015  . Orthostatic syncope 09/04/2015  . Orthostatic hypotension 09/04/2015  . Thoracic aortic atherosclerosis ([place]) 05/03/2015  . Vitamin D deficiency 02/02/2015  . Pre-diabetes 02/02/2015  . Metabolic syndrome 53/97/6734  . Osteoporosis 03/11/2013  . Atrial fibrillation ([place]) 11/24/2012  . Hypokalemia 05/04/2012  . Obesity   . Symptomatic menopausal or female climacteric states   . Kyphosis   . Gallstones   . Personal history of adenomatous colonic polyps   . Essential hypertension 03/29/2009   Outpatient Encounter Prescriptions as of 12/11/2016  Medication Sig  . acetaminophen (TYLENOL) 500 MG tablet Take 1,000 mg by mouth every 6 (six) hours as needed.  Marland Kitchen atenolol (TENORMIN) 50 MG tablet TAKE (1) TABLET TWICE A DAY.  .  captopril (CAPOTEN) 50 MG tablet TAKE (1) TABLET TWICE A DAY FOR HIGH BLOOD PRESSURE.  Marland Kitchen ezetimibe (ZETIA) 10 MG tablet TAKE 1 TABLET ONCE DAILY FOR CHOLESTEROL  . meloxicam (MOBIC) 15 MG tablet Take 1 tablet (15 mg total) by mouth daily. (Patient taking differently: Take 15 mg by mouth daily as needed. )  . oseltamivir (TAMIFLU) 30 MG capsule Take 1 capsule (30 mg total) by mouth 2 (two) times daily.  . potassium chloride (K-DUR) 10 MEQ tablet Take 1 tablet (10 mEq total) by mouth daily.  . pravastatin (PRAVACHOL) 80 MG tablet TAKE 1 TABLET ONCE DAILY FOR CHOLESTEROL  . Vitamin D, Ergocalciferol, (DRISDOL) 50000 units CAPS capsule Take 1 capsule (50,000 Units total) by mouth every 7 (seven) days.  Marland Kitchen warfarin (COUMADIN) 2.5 MG tablet TAKE 1 TABLET ONCE DAILY OR AS DIRECTED   No facility-administered encounter medications on file as of 12/11/2016.       Review of Systems  HENT: Positive for congestion.   Eyes: Negative.   Respiratory: Positive for cough.   Cardiovascular: Negative.   Gastrointestinal: Negative.   Endocrine: Negative.   Genitourinary: Negative.   Musculoskeletal: Negative.   Skin: Negative.   Allergic/Immunologic: Negative.   Neurological: Positive for weakness.  Hematological: Negative.   Psychiatric/Behavioral: Negative.        Objective:   Physical Exam  Constitutional: She is oriented to person, place, and time. She appears well-developed and well-nourished. No distress.  The patient is alert but weak and tired and  still coughing with congestion.  HENT:  Head: Atraumatic.  Right Ear: External ear normal.  Left Ear: External ear normal.  Nose: Nose normal.  Mouth/Throat: Oropharynx is clear and moist. No oropharyngeal exudate.  Eyes: Conjunctivae and EOM are normal. Pupils are equal, round, and reactive to light. Right eye exhibits no discharge. Left eye exhibits no discharge. No scleral icterus.  Neck: Normal range of motion. Neck supple. No thyromegaly  present.  Cardiovascular: Normal rate and normal heart sounds.   No murmur heard. The heart is irregular irregular at 84/m  Pulmonary/Chest: Effort normal. No respiratory distress. She has wheezes. She has no rales. She exhibits no tenderness.  Tight cough with expiratory wheezes. No rales.  Abdominal: Soft. Bowel sounds are normal. She exhibits no mass. There is no tenderness. There is no rebound and no guarding.  Musculoskeletal: Normal range of motion. She exhibits no edema.  Lymphadenopathy:    She has no cervical adenopathy.  Neurological: She is alert and oriented to person, place, and time.  Skin: Skin is warm and dry. No rash noted.  Psychiatric: She has a normal mood and affect. Her behavior is normal. Judgment and thought content normal.  Nursing note and vitals reviewed.  BP (!) 173/95 (BP Location: Right Arm)   Pulse 91   Temp 98.3 F (36.8 C) (Oral)   Ht _0  (1.549 m)   Wt 150 lb (68 kg)   BMI 28.34 kg/m   Repeat blood pressure right arm 152/98 with a regular cuff sitting      Assessment & Plan:  1. Hospital discharge follow-up -Patient is improved but still coughing and congested with some yellow sputum at times. - CBC with Differential/Platelet - BMP8+EGFR  2. Bronchitis with bronchospasm -Depo-Medrol 60 mg IM -Omnicef 300 mg twice daily for 10 days  3. Wheezing -Symbicort inhaler 160/12.5, 2 puffs twice daily and rinse mouth after using  4. Coughing -Take Mucinex maximum strength 1 twice daily with a large glass of water for cough and congestion  5. Persistent atrial fibrillation ([facility]) -Drink plenty of fluids and stay well hydrated -Repeat pro time by clinical pharmacy because of taking Z-Pak  6. Blood pressure elevated today  7. Hypokalemia -Check BMP today -Monitor blood pressures closely at home and bring readings to next visit  No orders of the defined types were placed in this encounter.  Patient Instructions  We will call with the  results of the CBC and BMP as soon as those results become available The patient is to take the antibiotic as directed She should use the inhaler as directed Use the Symbicort 160/4.5  2 puffs twice a day and rinse mouth after using The patient may need to come back and repeat a ProTime because of taking the antibiotic that is prescribed. We should recheck her again in a couple weeks to make sure she is better. The family should check blood pressure readings at home and bring these readings to the next visit. She should take Mucinex maximum strength, blue and white in color, 1 twice daily with a large glass of water.  Arrie Senate MD

## 2016-12-12 LAB — CBC WITH DIFFERENTIAL/PLATELET
Basophils Absolute: 0 10*3/uL (ref 0.0–0.2)
Basos: 0 %
EOS (ABSOLUTE): 0.1 10*3/uL (ref 0.0–0.4)
EOS: 2 %
HEMATOCRIT: 35.8 % (ref 34.0–46.6)
HEMOGLOBIN: 11.3 g/dL (ref 11.1–15.9)
IMMATURE GRANULOCYTES: 0 %
Immature Grans (Abs): 0 10*3/uL (ref 0.0–0.1)
LYMPHS: 20 %
Lymphocytes Absolute: 1.3 10*3/uL (ref 0.7–3.1)
MCH: 24.3 pg — ABNORMAL LOW (ref 26.6–33.0)
MCHC: 31.6 g/dL (ref 31.5–35.7)
MCV: 77 fL — ABNORMAL LOW (ref 79–97)
MONOCYTES: 7 %
Monocytes Absolute: 0.4 10*3/uL (ref 0.1–0.9)
NEUTROS PCT: 71 %
Neutrophils Absolute: 4.5 10*3/uL (ref 1.4–7.0)
Platelets: 256 10*3/uL (ref 150–379)
RBC: 4.65 x10E6/uL (ref 3.77–5.28)
RDW: 16.7 % — AB (ref 12.3–15.4)
WBC: 6.4 10*3/uL (ref 3.4–10.8)

## 2016-12-12 LAB — BMP8+EGFR
BUN/Creatinine Ratio: 10 — ABNORMAL LOW (ref 12–28)
BUN: 6 mg/dL — ABNORMAL LOW (ref 8–27)
CO2: 25 mmol/L (ref 18–29)
Calcium: 9.1 mg/dL (ref 8.7–10.3)
Chloride: 100 mmol/L (ref 96–106)
Creatinine, Ser: 0.6 mg/dL (ref 0.57–1.00)
GFR calc Af Amer: 100 mL/min/{1.73_m2}
GFR calc non Af Amer: 86 mL/min/{1.73_m2}
Glucose: 141 mg/dL — ABNORMAL HIGH (ref 65–99)
Potassium: 3.6 mmol/L (ref 3.5–5.2)
Sodium: 141 mmol/L (ref 134–144)

## 2016-12-13 ENCOUNTER — Telehealth: Payer: Self-pay | Admitting: Family Medicine

## 2016-12-13 NOTE — Telephone Encounter (Signed)
Dr Tawanna Sat NOTE - SAID follow up PRN  - he did not note to return in 2 weeks = she may needs labs?? LM for pt to call.

## 2016-12-13 NOTE — Telephone Encounter (Signed)
Please advise 

## 2016-12-19 ENCOUNTER — Ambulatory Visit (INDEPENDENT_AMBULATORY_CARE_PROVIDER_SITE_OTHER): Payer: Medicare Other | Admitting: Pharmacist

## 2016-12-19 DIAGNOSIS — I481 Persistent atrial fibrillation: Secondary | ICD-10-CM

## 2016-12-19 DIAGNOSIS — I4819 Other persistent atrial fibrillation: Secondary | ICD-10-CM

## 2016-12-19 LAB — COAGUCHEK XS/INR WAIVED
INR: 2.6 — AB (ref 0.9–1.1)
PROTHROMBIN TIME: 31.6 s

## 2016-12-28 ENCOUNTER — Encounter: Payer: Self-pay | Admitting: Family Medicine

## 2016-12-28 ENCOUNTER — Ambulatory Visit (INDEPENDENT_AMBULATORY_CARE_PROVIDER_SITE_OTHER): Payer: Medicare Other | Admitting: Family Medicine

## 2016-12-28 VITALS — BP 121/78 | HR 70 | Temp 97.8°F | Ht 61.0 in | Wt 149.0 lb

## 2016-12-28 DIAGNOSIS — I481 Persistent atrial fibrillation: Secondary | ICD-10-CM

## 2016-12-28 DIAGNOSIS — I4819 Other persistent atrial fibrillation: Secondary | ICD-10-CM

## 2016-12-28 DIAGNOSIS — J40 Bronchitis, not specified as acute or chronic: Secondary | ICD-10-CM

## 2016-12-28 LAB — COAGUCHEK XS/INR WAIVED
INR: 2.8 — AB (ref 0.9–1.1)
PROTHROMBIN TIME: 33 s

## 2016-12-28 LAB — POCT INR: INR: 2.8

## 2016-12-28 NOTE — Patient Instructions (Signed)
Anticoagulation Dose Instructions as of 12/28/2016      Kari [name] Tue Wed Thu Fri Sat   New Dose 2.5 mg 1.25 mg 2.5 mg 2.5 mg 2.5 mg 1.25 mg 2.5 mg    Description   Continue current warfarin dose of 2.5mg  tablets - take 1/2 tablet on Mondays and Fridays.  Take 1 tablet all other days.  INR was 2.8 today

## 2016-12-28 NOTE — Progress Notes (Signed)
BP 121/78   Pulse 70   Temp 97.8 F (36.6 C) (Oral)   Ht 5\' 1"  (1.549 m)   Wt 149 lb (67.6 kg)   BMI 28.15 kg/m    Subjective:    Patient ID: Kari [name], female    DOB: 01-Oct-1933, 83 y.o.   MRN: [id number]  HPI: Kari [name] is a 83 y.o. female presenting on 12/28/2016 for Followup bronchitis & protime   HPI A. fib and Coumadin recheck Patient is coming in for A. fib and Coumadin recheck today. She has been stable for some time and her last INR was in the 2 range. Today her INR is 2.8. She denies any bleeding or chest pain or lightheadedness or dizziness.  Bronchitis follow-up Patient is coming in for follow-up on bronchitis. She says that all of her symptoms have resolved and she is not having any coughing or shortness of breath or wheezing. She denies any fevers or chills. She is really doing very well and does not feel like she needs anything further for this.  Relevant past medical, surgical, family and social history reviewed and updated as indicated. Interim medical history since our last visit reviewed. Allergies and medications reviewed and updated.  Review of Systems  Constitutional: Negative for chills and fever.  Respiratory: Negative for cough, chest tightness, shortness of breath and wheezing.   Cardiovascular: Negative for chest pain and leg swelling.  Musculoskeletal: Negative for back pain and gait problem.  Skin: Negative for rash.  Neurological: Negative for dizziness and light-headedness.  Hematological: Does not bruise/bleed easily.  Psychiatric/Behavioral: Negative for agitation and behavioral problems.  All other systems reviewed and are negative.   Per HPI unless specifically indicated above       Objective:    BP 121/78   Pulse 70   Temp 97.8 F (36.6 C) (Oral)   Ht 5\' 1"  (1.549 m)   Wt 149 lb (67.6 kg)   BMI 28.15 kg/m   Wt Readings from Last 3 Encounters:  12/28/16 149 lb (67.6 kg)  12/11/16 150 lb (68 kg)  12/06/16 155 lb  13.8 oz (70.7 kg)    Physical Exam  Constitutional: She is oriented to person, place, and time. She appears well-developed and well-nourished. No distress.  Eyes: Conjunctivae are normal.  Cardiovascular: Normal rate, regular rhythm, normal heart sounds and intact distal pulses.   No murmur heard. Pulmonary/Chest: Effort normal and breath sounds normal. No respiratory distress. She has no wheezes. She has no rales.  Neurological: She is alert and oriented to person, place, and time. Coordination normal.  Skin: Skin is warm and dry. No rash noted. She is not diaphoretic.  Psychiatric: She has a normal mood and affect. Her behavior is normal.  Nursing note and vitals reviewed.   Results for orders placed or performed in visit on 12/28/16  POCT INR  Result Value Ref Range   INR 2.8    Anticoagulation Dose Instructions as of 12/28/2016      Dorene Grebe Tue Wed Thu Fri Sat   New Dose 2.5 mg 1.25 mg 2.5 mg 2.5 mg 2.5 mg 1.25 mg 2.5 mg    Description   Continue current warfarin dose of 2.5mg  tablets - take 1/2 tablet on Mondays and Fridays.  Take 1 tablet all other days.  INR was 2.8 today        Assessment & Plan:   Problem List Items Addressed This Visit      Cardiovascular and Mediastinum  Atrial fibrillation ([place])    Other Visit Diagnoses    Atrial fibrillation, persistent ([place])    -  Primary   Bronchitis       Patient is much improved, having no further symptoms.       Follow up plan: Return if symptoms worsen or fail to improve.  Counseling provided for all of the vaccine components Orders Placed This Encounter  Procedures  . POCT INR    Caryl Pina, MD [facility] 12/28/2016, 8:33 AM

## 2017-01-09 ENCOUNTER — Encounter: Payer: Self-pay | Admitting: Pharmacist

## 2017-01-23 ENCOUNTER — Ambulatory Visit (INDEPENDENT_AMBULATORY_CARE_PROVIDER_SITE_OTHER): Payer: Medicare Other | Admitting: Pharmacist

## 2017-01-23 DIAGNOSIS — I4819 Other persistent atrial fibrillation: Secondary | ICD-10-CM

## 2017-01-23 DIAGNOSIS — I481 Persistent atrial fibrillation: Secondary | ICD-10-CM

## 2017-01-23 LAB — COAGUCHEK XS/INR WAIVED
INR: 2.2 — AB (ref 0.9–1.1)
Prothrombin Time: 26.8 s

## 2017-02-05 ENCOUNTER — Ambulatory Visit: Payer: Medicare Other | Admitting: Family Medicine

## 2017-02-10 IMAGING — CR DG CHEST 1V PORT
1 series · 1 of 1 positions shown · non-contrast
Comparison: 09/04/2015

CLINICAL DATA: Pt had a syncopal episode 2 days ago and was
admitted to hospital, was release 1 day ago - hx Afib - pt reports
back pain that began today , cough and wheezing x 4 days

EXAM:
PORTABLE CHEST 1 VIEW

[AP]
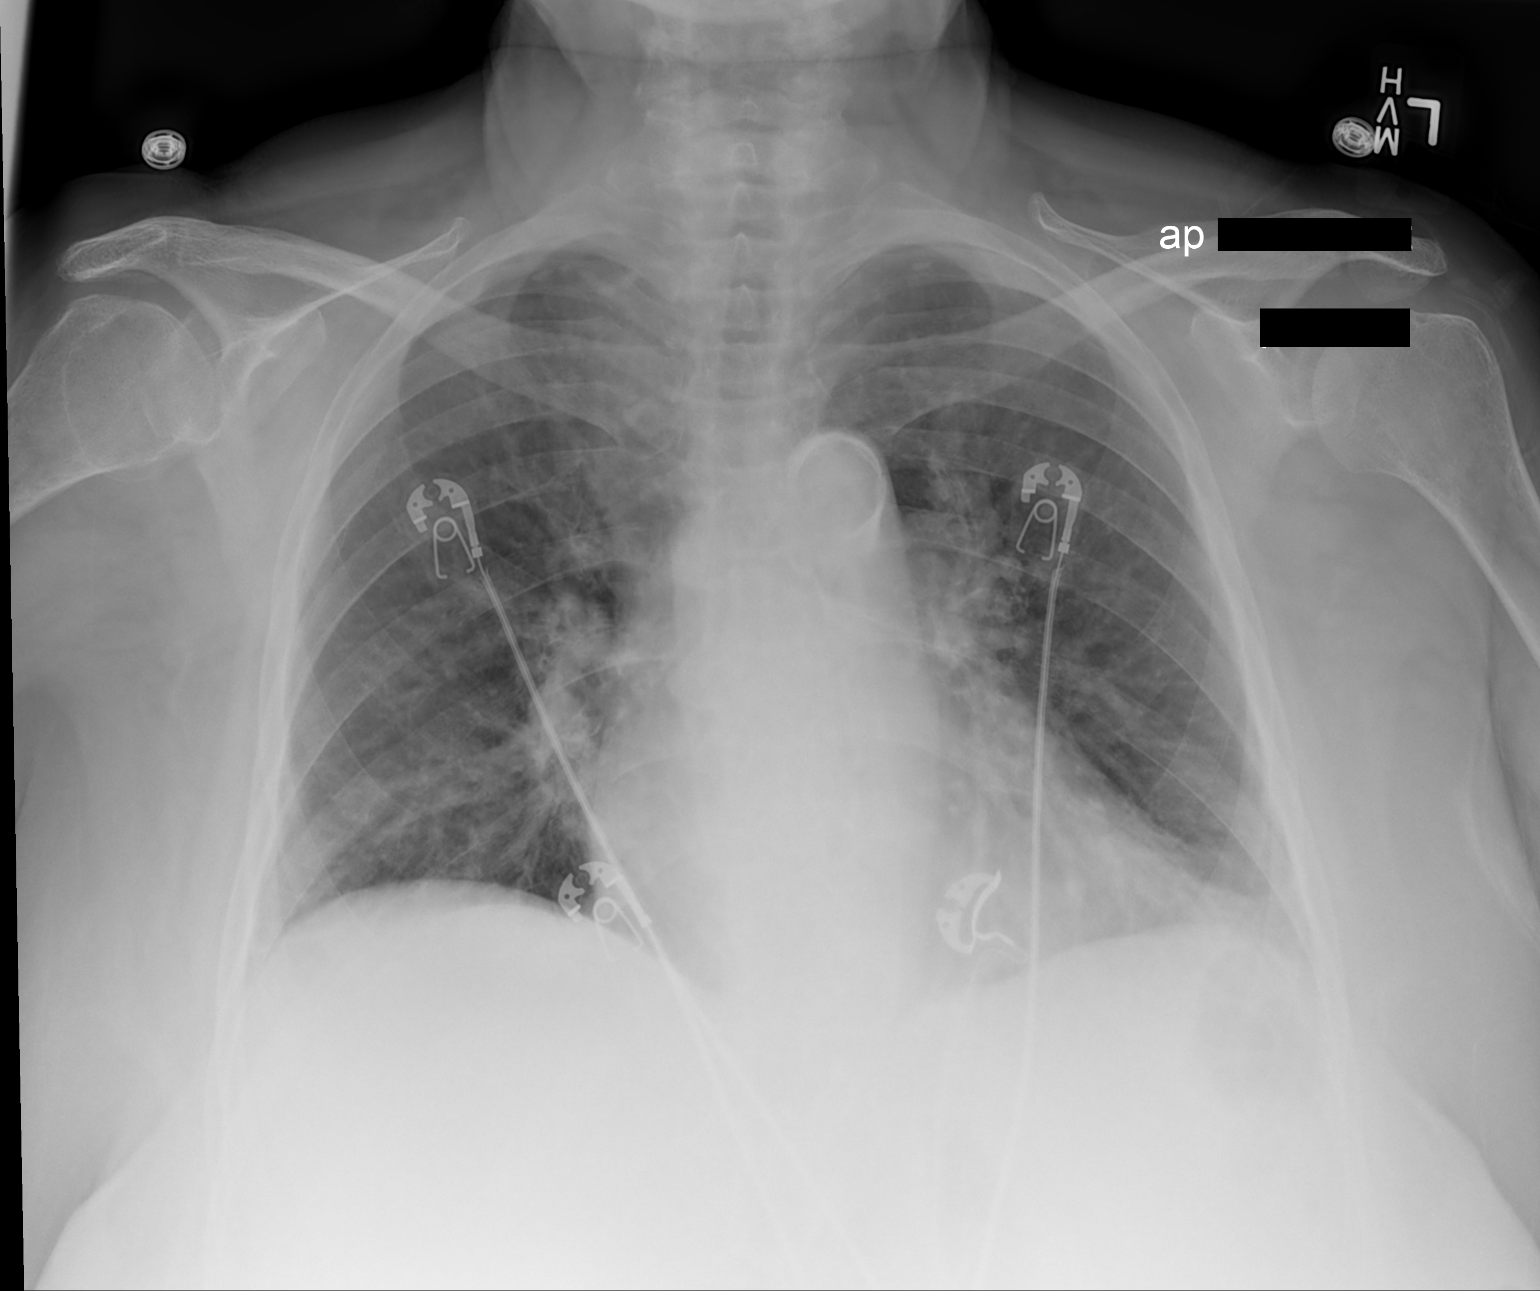

[1 of 1 positions shown; findings below may reference images not displayed]

FINDINGS: Shallow lung inflation. Heart size is mildly enlarged. There is
perihilar peribronchial thickening. There are no focal
consolidations. No definite pleural effusions.
IMPRESSION: Bronchitic thickening.  Cardiomegaly.

## 2017-02-13 ENCOUNTER — Ambulatory Visit: Payer: Self-pay | Admitting: Pharmacist

## 2017-02-19 ENCOUNTER — Encounter: Payer: Self-pay | Admitting: Family Medicine

## 2017-02-19 ENCOUNTER — Ambulatory Visit (INDEPENDENT_AMBULATORY_CARE_PROVIDER_SITE_OTHER): Payer: Medicare Other | Admitting: Family Medicine

## 2017-02-19 VITALS — BP 160/82 | HR 82 | Temp 98.3°F | Ht 61.0 in | Wt 153.0 lb

## 2017-02-19 DIAGNOSIS — I700 Atherosclerosis of aorta: Secondary | ICD-10-CM | POA: Diagnosis not present

## 2017-02-19 DIAGNOSIS — I4819 Other persistent atrial fibrillation: Secondary | ICD-10-CM

## 2017-02-19 DIAGNOSIS — I10 Essential (primary) hypertension: Secondary | ICD-10-CM | POA: Diagnosis not present

## 2017-02-19 DIAGNOSIS — R911 Solitary pulmonary nodule: Secondary | ICD-10-CM | POA: Diagnosis not present

## 2017-02-19 DIAGNOSIS — E7800 Pure hypercholesterolemia, unspecified: Secondary | ICD-10-CM | POA: Diagnosis not present

## 2017-02-19 DIAGNOSIS — I481 Persistent atrial fibrillation: Secondary | ICD-10-CM | POA: Diagnosis not present

## 2017-02-19 DIAGNOSIS — E559 Vitamin D deficiency, unspecified: Secondary | ICD-10-CM

## 2017-02-19 LAB — COAGUCHEK XS/INR WAIVED
INR: 2.9 — AB (ref 0.9–1.1)
PROTHROMBIN TIME: 34.8 s

## 2017-02-19 NOTE — Patient Instructions (Addendum)
Medicare Annual Wellness Visit  [facility] and the medical providers at [facility] strive to bring you the best medical care.  In doing so we not only want to address your current medical conditions and concerns but also to detect new conditions early and prevent illness, disease and health-related problems.    Medicare offers a yearly Wellness Visit which allows our clinical staff to assess your need for preventative services including immunizations, lifestyle education, counseling to decrease risk of preventable diseases and screening for fall risk and other medical concerns.    This visit is provided free of charge (no copay) for all Medicare recipients. The clinical pharmacists at [facility] have begun to conduct these Wellness Visits which will also include a thorough review of all your medications.    As you primary medical provider recommend that you make an appointment for your Annual Wellness Visit if you have not done so already this year.  You may set up this appointment before you leave today or you may call back ([phone]) and schedule an appointment.  Please make sure when you call that you mention that you are scheduling your Annual Wellness Visit with the clinical pharmacist so that the appointment may be made for the proper length of time.     Continue current medications. Continue good therapeutic lifestyle changes which include good diet and exercise. Fall precautions discussed with patient. If an FOBT was given today- please return it to our front desk. If you are over 40 years old - you may need Prevnar 16 or the adult Pneumonia vaccine.  **Flu shots are available--- please call and schedule a FLU-CLINIC appointment**  After your visit with [place] today you will receive a survey in the mail or online from [employer] regarding your care with [place]. Please take a moment to fill this out. Your feedback is very  important to [place] as you can help [place] better understand your patient needs as well as improve your experience and satisfaction. WE CARE ABOUT YOU!!!   Because of the abnormal chest CT we will schedule you for a repeat chest CT in July Even if this one is normal with no change you'll need another one in 18-24 months out from the initial one. This was reviewed with her daughter today. Patient was reminded to be careful not put herself at risk for falling and could not be picking up things or heavy The previous CT was reviewed with the patient and her daughter in the office today and they were given a copy of this report

## 2017-02-19 NOTE — Progress Notes (Signed)
Subjective:    Patient ID: Kari [name], female    DOB: 10-01-1933, 83 y.o.   MRN: [id number]  HPI Pt here for follow up and management of chronic medical problems which includes hyperlipidemia, hypertension and a fib. She is taking medication regularly.Over this past winter, this patient has had several visits to the hospital the last one being influenza on April 4. She had other visits prior to that for acute right-sided low back pain. She comes to the visit today with her daughter [name]. Her blood pressure is elevated today on initial reading. She is due to return an FOBT and will get lab work today. She has no specific complaints. The patient is back to her usual condition and she has no complaints today and as I said earlier she is with her daughter. She denies any chest pain or shortness of breath. She denies any trouble with nausea vomiting diarrhea blood in the stool or black tarry bowel movements. She is passing her water without problems.    Patient Active Problem List   Diagnosis Date Noted  . Influenza A 12/06/2016  . Sciatica of right side 04/23/2016  . At high risk for falls 04/23/2016  . Increased BMI 09/30/2015  . Hyperlipidemia 09/07/2015  . Low back pain 09/07/2015  . Hypoxia 09/06/2015  . Orthostatic syncope 09/04/2015  . Orthostatic hypotension 09/04/2015  . Thoracic aortic atherosclerosis ([place]) 05/03/2015  . Vitamin D deficiency 02/02/2015  . Pre-diabetes 02/02/2015  . Metabolic syndrome 27/11/5007  . Osteoporosis 03/11/2013  . Atrial fibrillation ([place]) 11/24/2012  . Hypokalemia 05/04/2012  . Obesity   . Symptomatic menopausal or female climacteric states   . Kyphosis   . Gallstones   . Personal history of adenomatous colonic polyps   . Essential hypertension 03/29/2009   Outpatient Encounter Prescriptions as of 02/19/2017  Medication Sig  . acetaminophen (TYLENOL) 500 MG tablet Take 1,000 mg by mouth every 6 (six) hours as needed.  Marland Kitchen atenolol (TENORMIN)  50 MG tablet TAKE (1) TABLET TWICE A DAY.  . captopril (CAPOTEN) 50 MG tablet TAKE (1) TABLET TWICE A DAY FOR HIGH BLOOD PRESSURE.  Marland Kitchen ezetimibe (ZETIA) 10 MG tablet TAKE 1 TABLET ONCE DAILY FOR CHOLESTEROL  . meloxicam (MOBIC) 15 MG tablet Take 1 tablet (15 mg total) by mouth daily. (Patient taking differently: Take 15 mg by mouth daily as needed. )  . potassium chloride (K-DUR) 10 MEQ tablet Take 1 tablet (10 mEq total) by mouth daily.  . pravastatin (PRAVACHOL) 80 MG tablet TAKE 1 TABLET ONCE DAILY FOR CHOLESTEROL  . Vitamin D, Ergocalciferol, (DRISDOL) 50000 units CAPS capsule Take 1 capsule (50,000 Units total) by mouth every 7 (seven) days.  Marland Kitchen warfarin (COUMADIN) 2.5 MG tablet TAKE 1 TABLET ONCE DAILY OR AS DIRECTED   No facility-administered encounter medications on file as of 02/19/2017.       Review of Systems  Constitutional: Negative.   HENT: Negative.   Eyes: Negative.   Respiratory: Negative.   Cardiovascular: Negative.   Gastrointestinal: Negative.   Endocrine: Negative.   Genitourinary: Negative.   Musculoskeletal: Negative.   Skin: Negative.   Allergic/Immunologic: Negative.   Neurological: Negative.   Hematological: Negative.   Psychiatric/Behavioral: Negative.        Objective:   Physical Exam  Constitutional: She is oriented to person, place, and time. She appears well-developed and well-nourished. No distress.  HENT:  Head: Normocephalic and atraumatic.  Left Ear: External ear normal.  Nose: Nose normal.  Mouth/Throat: Oropharynx  is clear and moist.  Ears cerumen right ear canal and some nasal congestion bilaterally  Eyes: Conjunctivae and EOM are normal. Pupils are equal, round, and reactive to light. Right eye exhibits no discharge. Left eye exhibits no discharge. No scleral icterus.  Neck: Normal range of motion. Neck supple. No thyromegaly present.  No bruits thyromegaly or anterior cervical adenopathy  Cardiovascular: Normal rate and intact distal  pulses.   No murmur heard. The heart has an irregular irregular rhythm at 84/m  Pulmonary/Chest: Effort normal and breath sounds normal. No respiratory distress. She has no wheezes. She has no rales.  Clear anteriorly and posteriorly  Abdominal: Soft. Bowel sounds are normal. She exhibits no mass. There is no tenderness. There is no rebound and no guarding.  Abdomen is obese without masses tenderness or organ enlargement or bruits  Musculoskeletal: Normal range of motion. She exhibits no edema.  Lymphadenopathy:    She has no cervical adenopathy.  Neurological: She is alert and oriented to person, place, and time. She has normal reflexes. No cranial nerve deficit.  Skin: Skin is warm and dry. No rash noted.  Psychiatric: She has a normal mood and affect. Her behavior is normal. Judgment and thought content normal.  Nursing note and vitals reviewed.  BP (!) 162/87 (BP Location: Left Arm)   Pulse 82   Temp 98.3 F (36.8 C) (Oral)   Ht 5\' 1"  (1.549 m)   Wt 153 lb (69.4 kg)   BMI 28.91 kg/m   Repeat blood pressure once 160/82 in the right arm supine      Assessment & Plan:  1. Essential hypertension -The blood pressure was elevated in the office today. The patient has been checking these readings regularly at home and the daughter confirmed that they've been running in the 120 to 1:30 range over the 70s. They will double check and make sure the readings are good at home. Patient should continue to watch her sodium intake.  2. Persistent atrial fibrillation ([place]) -She was in atrophia today at 84/m and we'll continue to follow-up with the cardiologist as planned  3. Vitamin D deficiency -Continue current treatment pending results of lab work  4. Thoracic aortic atherosclerosis ([place]) -Continue aggressive therapeutic lifestyle changes and Zetia  5. Pure hypercholesterolemia -Continue aggressive therapeutic lifestyle changes and Zetia  6. Lung nodule -The patient denies any  problems with her breathing and her chest was clear. - CT Chest Wo Contrast; Future  Patient Instructions                       Medicare Annual Wellness Visit  [facility] and the medical providers at [facility] strive to bring you the best medical care.  In doing so we not only want to address your current medical conditions and concerns but also to detect new conditions early and prevent illness, disease and health-related problems.    Medicare offers a yearly Wellness Visit which allows our clinical staff to assess your need for preventative services including immunizations, lifestyle education, counseling to decrease risk of preventable diseases and screening for fall risk and other medical concerns.    This visit is provided free of charge (no copay) for all Medicare recipients. The clinical pharmacists at [facility] have begun to conduct these Wellness Visits which will also include a thorough review of all your medications.    As you primary medical provider recommend that you make an appointment for your Annual  Wellness Visit if you have not done so already this year.  You may set up this appointment before you leave today or you may call back ([phone]) and schedule an appointment.  Please make sure when you call that you mention that you are scheduling your Annual Wellness Visit with the clinical pharmacist so that the appointment may be made for the proper length of time.     Continue current medications. Continue good therapeutic lifestyle changes which include good diet and exercise. Fall precautions discussed with patient. If an FOBT was given today- please return it to our front desk. If you are over 17 years old - you may need Prevnar 74 or the adult Pneumonia vaccine.  **Flu shots are available--- please call and schedule a FLU-CLINIC appointment**  After your visit with [place] today you will receive a survey in the mail or online  from [employer] regarding your care with [place]. Please take a moment to fill this out. Your feedback is very important to [place] as you can help [place] better understand your patient needs as well as improve your experience and satisfaction. WE CARE ABOUT YOU!!!   Because of the abnormal chest CT we will schedule you for a repeat chest CT in July Even if this one is normal with no change you'll need another one in 18-24 months out from the initial one. This was reviewed with her daughter today. Patient was reminded to be careful not put herself at risk for falling and could not be picking up things or heavy The previous CT was reviewed with the patient and her daughter in the office today and they were given a copy of this report    Arrie Senate MD

## 2017-02-20 ENCOUNTER — Other Ambulatory Visit: Payer: Self-pay | Admitting: *Deleted

## 2017-02-20 LAB — BMP8+EGFR
BUN / CREAT RATIO: 11 — AB (ref 12–28)
BUN: 7 mg/dL — AB (ref 8–27)
CO2: 23 mmol/L (ref 20–29)
CREATININE: 0.66 mg/dL (ref 0.57–1.00)
Calcium: 9.1 mg/dL (ref 8.7–10.3)
Chloride: 102 mmol/L (ref 96–106)
GFR, EST AFRICAN AMERICAN: 96 mL/min/{1.73_m2} (ref >59)
GFR, EST NON AFRICAN AMERICAN: 84 mL/min/{1.73_m2} (ref >59)
Glucose: 95 mg/dL (ref 65–99)
Potassium: 3.7 mmol/L (ref 3.5–5.2)
Sodium: 141 mmol/L (ref 134–144)

## 2017-02-20 LAB — CBC WITH DIFFERENTIAL/PLATELET
BASOS: 0 %
Basophils Absolute: 0 10*3/uL (ref 0.0–0.2)
EOS (ABSOLUTE): 0.1 10*3/uL (ref 0.0–0.4)
Eos: 2 %
HEMATOCRIT: 36.2 % (ref 34.0–46.6)
Hemoglobin: 11 g/dL — ABNORMAL LOW (ref 11.1–15.9)
IMMATURE GRANS (ABS): 0 10*3/uL (ref 0.0–0.1)
Immature Granulocytes: 0 %
Lymphocytes Absolute: 1.3 10*3/uL (ref 0.7–3.1)
Lymphs: 22 %
MCH: 23.6 pg — AB (ref 26.6–33.0)
MCHC: 30.4 g/dL — ABNORMAL LOW (ref 31.5–35.7)
MCV: 78 fL — AB (ref 79–97)
Monocytes Absolute: 0.5 10*3/uL (ref 0.1–0.9)
Monocytes: 8 %
NEUTROS ABS: 4.1 10*3/uL (ref 1.4–7.0)
Neutrophils: 68 %
PLATELETS: 253 10*3/uL (ref 150–379)
RBC: 4.66 x10E6/uL (ref 3.77–5.28)
RDW: 18 % — ABNORMAL HIGH (ref 12.3–15.4)
WBC: 6.1 10*3/uL (ref 3.4–10.8)

## 2017-02-20 LAB — LIPID PANEL
CHOL/HDL RATIO: 2.8 ratio (ref 0.0–4.4)
Cholesterol, Total: 119 mg/dL (ref 100–199)
HDL: 43 mg/dL (ref >39)
LDL CALC: 60 mg/dL (ref 0–99)
Triglycerides: 78 mg/dL (ref 0–149)
VLDL Cholesterol Cal: 16 mg/dL (ref 5–40)

## 2017-02-20 LAB — HEPATIC FUNCTION PANEL
ALT: 8 IU/L (ref 0–32)
AST: 24 IU/L (ref 0–40)
Albumin: 4.1 g/dL (ref 3.5–4.7)
Alkaline Phosphatase: 72 IU/L (ref 39–117)
Bilirubin Total: 0.6 mg/dL (ref 0.0–1.2)
Bilirubin, Direct: 0.16 mg/dL (ref 0.00–0.40)
Total Protein: 7 g/dL (ref 6.0–8.5)

## 2017-02-20 LAB — VITAMIN D 25 HYDROXY (VIT D DEFICIENCY, FRACTURES): VIT D 25 HYDROXY: 27.1 ng/mL — AB (ref 30.0–100.0)

## 2017-02-20 MED ORDER — VITAMIN D (ERGOCALCIFEROL) 1.25 MG (50000 UNIT) PO CAPS: 50000.0000 [IU] | ORAL_CAPSULE | ORAL | 1 refills | Status: DC

## 2017-02-27 ENCOUNTER — Ambulatory Visit (INDEPENDENT_AMBULATORY_CARE_PROVIDER_SITE_OTHER): Payer: Medicare Other | Admitting: Pharmacist

## 2017-02-27 ENCOUNTER — Encounter: Payer: Self-pay | Admitting: Pharmacist

## 2017-02-27 VITALS — BP 124/90 | HR 64 | Ht 60.25 in | Wt 154.0 lb

## 2017-02-27 DIAGNOSIS — Z0000 Encounter for general adult medical examination without abnormal findings: Secondary | ICD-10-CM | POA: Diagnosis not present

## 2017-02-27 DIAGNOSIS — I4819 Other persistent atrial fibrillation: Secondary | ICD-10-CM

## 2017-02-27 DIAGNOSIS — D369 Benign neoplasm, unspecified site: Secondary | ICD-10-CM

## 2017-02-27 DIAGNOSIS — M8580 Other specified disorders of bone density and structure, unspecified site: Secondary | ICD-10-CM

## 2017-02-27 DIAGNOSIS — I481 Persistent atrial fibrillation: Secondary | ICD-10-CM | POA: Diagnosis not present

## 2017-02-27 LAB — COAGUCHEK XS/INR WAIVED
INR: 2.4 — ABNORMAL HIGH (ref 0.9–1.1)
Prothrombin Time: 29.3 s

## 2017-02-27 NOTE — Patient Instructions (Addendum)
[name] , Thank you for taking time to come for your Medicare Wellness Visit. I appreciate your ongoing commitment to your health goals. Please review the following plan we discussed and let me know if I can assist you in the future.   These are the goals we discussed: Recommend physical therapy for balance assessment and leg strengthening to prevent falls in future.     This is a list of the screening recommended for you and due dates:  Health Maintenance  Topic Date Due  . Colon Cancer Screening  02/26/2017  . Mammogram  10/02/2019*  . DEXA scan (bone density measurement)  03/15/2017  . Flu Shot  04/03/2017  . Tetanus Vaccine  05/04/2021  . Pneumonia vaccines  Completed  *Topic was postponed. The date shown is not the original due date.    Fall Prevention in the Home Falls can cause injuries and can affect people from all age groups. There are many simple things that you can do to make your home safe and to help prevent falls. What can I do on the outside of my home?  Regularly repair the edges of walkways and driveways and fix any cracks.  Remove high doorway thresholds.  Trim any shrubbery on the main path into your home.  Use bright outdoor lighting.  Clear walkways of debris and clutter, including tools and rocks.  Regularly check that handrails are securely fastened and in good repair. Both sides of any steps should have handrails.  Install guardrails along the edges of any raised decks or porches.  Have leaves, snow, and ice cleared regularly.  Use sand or salt on walkways during winter months.  In the garage, clean up any spills right away, including grease or oil spills. What can I do in the bathroom?  Use night lights.  Install grab bars by the toilet and in the tub and shower. Do not use towel bars as grab bars.  Use non-skid mats or decals on the floor of the tub or shower.  If you need to sit down while you are in the shower, use a plastic, non-slip  stool.  Keep the floor dry. Immediately clean up any water that spills on the floor.  Remove soap buildup in the tub or shower on a regular basis.  Attach bath mats securely with double-sided non-slip rug tape.  Remove throw rugs and other tripping hazards from the floor. What can I do in the bedroom?  Use night lights.  Make sure that a bedside light is easy to reach.  Do not use oversized bedding that drapes onto the floor.  Have a firm chair that has side arms to use for getting dressed.  Remove throw rugs and other tripping hazards from the floor. What can I do in the kitchen?  Clean up any spills right away.  Avoid walking on wet floors.  Place frequently used items in easy-to-reach places.  If you need to reach for something above you, use a sturdy step stool that has a grab bar.  Keep electrical cables out of the way.  Do not use floor polish or wax that makes floors slippery. If you have to use wax, make sure that it is non-skid floor wax.  Remove throw rugs and other tripping hazards from the floor. What can I do in the stairways?  Do not leave any items on the stairs.  Make sure that there are handrails on both sides of the stairs. Fix handrails that are broken or  loose. Make sure that handrails are as long as the stairways.  Check any carpeting to make sure that it is firmly attached to the stairs. Fix any carpet that is loose or worn.  Avoid having throw rugs at the top or bottom of stairways, or secure the rugs with carpet tape to prevent them from moving.  Make sure that you have a light switch at the top of the stairs and the bottom of the stairs. If you do not have them, have them installed. What are some other fall prevention tips?  Wear closed-toe shoes that fit well and support your feet. Wear shoes that have rubber soles or low heels.  When you use a stepladder, make sure that it is completely opened and that the sides are firmly locked. Have  someone hold the ladder while you are using it. Do not climb a closed stepladder.  Add color or contrast paint or tape to grab bars and handrails in your home. Place contrasting color strips on the first and last steps.  Use mobility aids as needed, such as canes, walkers, scooters, and crutches.  Turn on lights if it is dark. Replace any light bulbs that burn out.  Set up furniture so that there are clear paths. Keep the furniture in the same spot.  Fix any uneven floor surfaces.  Choose a carpet design that does not hide the edge of steps of a stairway.  Be aware of any and all pets.  Review your medicines with your healthcare provider. Some medicines can cause dizziness or changes in blood pressure, which increase your risk of falling. Talk with your health care provider about other ways that you can decrease your risk of falls. This may include working with a physical therapist or trainer to improve your strength, balance, and endurance. This information is not intended to replace advice given to you by your health care provider. Make sure you discuss any questions you have with your health care provider. Document Released: 08/10/2002 Document Revised: 01/17/2016 Document Reviewed: 09/24/2014 Elsevier Interactive Patient Education  2017 [employer].

## 2017-02-27 NOTE — Progress Notes (Signed)
Patient ID: [name], female   DOB: Jul 26, 1934, 83 y.o.   MRN: [id number]     Subjective:   LYNSI DOONER is a 83 y.o. female who presents for an Subsequent  Medicare Annual Wellness Visit and to recheck INR Mrs. Sardinas is on chronic anticoagulation therapy for atrial fibrillation.  She is currently taking warfarin 2.5mg  tablets - 1/2 tablet on mondays and fridays and 1 tablet all other days. She denies any s/s of bleeding.  No misses doses.   Social History: Occupational history: homemaker Marital history: Married Husband and son live with patient.  She attends church regularly.  She enjoys working puzzles at home.  Alcohol/Tobacco/Substances: none  Review of Systems  Review of Systems  Constitutional: Negative.   Cardiovascular: Negative.   Gastrointestinal: Negative.   Musculoskeletal: Positive for falls.  Skin: Negative.   Psychiatric/Behavioral: Negative.      Current Medications (verified) Outpatient Encounter Prescriptions as of 02/27/2017  Medication Sig  . acetaminophen (TYLENOL) 500 MG tablet Take 1,000 mg by mouth every 6 (six) hours as needed.  Marland Kitchen atenolol (TENORMIN) 50 MG tablet TAKE (1) TABLET TWICE A DAY.  . captopril (CAPOTEN) 50 MG tablet TAKE (1) TABLET TWICE A DAY FOR HIGH BLOOD PRESSURE.  Marland Kitchen ezetimibe (ZETIA) 10 MG tablet TAKE 1 TABLET ONCE DAILY FOR CHOLESTEROL  . meloxicam (MOBIC) 15 MG tablet Take 1 tablet (15 mg total) by mouth daily. (Patient taking differently: Take 15 mg by mouth daily as needed. )  . potassium chloride (K-DUR) 10 MEQ tablet Take 1 tablet (10 mEq total) by mouth daily.  . pravastatin (PRAVACHOL) 80 MG tablet TAKE 1 TABLET ONCE DAILY FOR CHOLESTEROL  . Vitamin D, Ergocalciferol, (DRISDOL) 50000 units CAPS capsule Take 1 capsule (50,000 Units total) by mouth every 7 (seven) days.  Marland Kitchen warfarin (COUMADIN) 2.5 MG tablet TAKE 1 TABLET ONCE DAILY OR AS DIRECTED   No facility-administered encounter medications on file as of 02/27/2017.      Allergies (verified) Erythromycin and Penicillins   History: Past Medical History:  Diagnosis Date  . Adenomatous polyps   . AF (atrial fibrillation) ([place])   . Cataract   . Gallstones   . Hx of adenomatous colonic polyps   . Hyperlipidemia    x5 years  . Hypertension    x5 years  . Kyphosis   . Metabolic syndrome X   . Obesity   . Osteoporosis   . Symptomatic menopausal or female climacteric states   . Vitamin D deficiency    Past Surgical History:  Procedure Laterality Date  . ABDOMINAL HYSTERECTOMY    . BLADDER SUSPENSION    . COLONOSCOPY  multiple  . EYE SURGERY Bilateral 09/2015  . VAGINAL HYSTERECTOMY     total   Family History  Problem Relation Age of Onset  . Heart attack Mother   . Stroke Mother   . Heart attack Father   . Alcohol abuse Father   . Hypertension Sister   . Stroke Brother   . Heart attack Brother   . Depression Brother        suicide  . Heart attack Other   . Stroke Other   . Breast cancer Other   . Colon cancer Neg Hx    Social History   Occupational History  . [place] History Main Topics  . Smoking status: Never Smoker  . Smokeless tobacco: Never Used  . Alcohol use No  . Drug use: No  . Sexual  activity: No    Do you feel safe at home?  Yes Are there smokers in your home (other than you)? No  Dietary issues and exercise activities: Current Exercise Habits: Home exercise routine, Type of exercise: strength training/weights;stretching, Time (Minutes): 15, Frequency (Times/Week): 3, Weekly Exercise (Minutes/Week): 45, Intensity: Mild  Current Dietary habits:  Eats regular meals - usually 3 per day. Family eats out about 5 times per week.  She does try to keep serving sizes small and limit sodium intake. She tries to be consistent with green leafy vegetable intake due to warfarin therapy  Objective:    Today's Vitals   02/27/17 0921  BP: 124/90  Pulse: 64  Weight: 154 lb (69.9 kg)  Height: 5' 0.25" (1.53 m)   PainSc: 0-No pain   Body mass index is 29.83 kg/m.   INR was 2.4 today  Activities of Daily Living In your present state of health, do you have any difficulty performing the following activities: 02/27/2017 12/06/2016  Hearing? N N  Vision? N N  Difficulty concentrating or making decisions? N N  Walking or climbing stairs? Y N  Dressing or bathing? N N  Doing errands, shopping? N N  Preparing Food and eating ? N -  Using the Toilet? N -  In the past six months, have you accidently leaked urine? N -  Do you have problems with loss of bowel control? N -  Managing your Medications? N -  Managing your Finances? N -  Housekeeping or managing your Housekeeping? N -  Some recent data might be hidden     Cardiac Risk Factors include: advanced age (>46men, >40 women);dyslipidemia;hypertension  Depression Screen PHQ 2/9 Scores 02/27/2017 02/19/2017 12/28/2016 12/11/2016  PHQ - 2 Score 0 0 0 0     Fall Risk Fall Risk  02/27/2017 02/19/2017 12/28/2016 12/11/2016 11/06/2016  Falls in the past year? Yes Yes Yes Yes Yes  Number falls in past yr: 1 1 1  - 1  Injury with Fall? Yes No No No No  Risk for fall due to : History of fall(s);Impaired balance/gait - - - -  Follow up Falls prevention discussed - - - -   Discussed falls:  Patient is not using assistance for walking (cane, walker etc) She mentioned that she does not go to flea market any more because she is afraid of falling.   Cognitive Function: MMSE - Mini Mental State Exam 02/08/2016 02/02/2015  Orientation to time 5 5  Orientation to Place 5 5  Registration 3 3  Attention/ Calculation 4 5  Recall 2 3  Language- name 2 objects 2 2  Language- repeat 1 -  Language- follow 3 step command 3 3  Language- read & follow direction 1 1  Write a sentence 1 1  Copy design 1 1  Total score 28 -    Immunizations and Health Maintenance Immunization History  Administered Date(s) Administered  . Influenza Split 06/18/2015  . Influenza Whole  05/04/2010  . Influenza, High Dose Seasonal PF 05/25/2016  . Influenza,inj,Quad PF,36+ Mos 05/28/2013, 06/21/2014, 06/08/2015  . Pneumococcal Conjugate-13 01/06/2014  . Pneumococcal Polysaccharide-23 10/05/1999, 02/02/2015  . Td 10/05/2007  . Tdap 05/05/2011   Health Maintenance Due  Topic Date Due  . COLONOSCOPY  02/26/2017    Patient Care Team: Chipper Herb, MD as PCP - General (Family Medicine) Minus Breeding, MD as Consulting Physician (Cardiology) Gatha Mayer, MD as Consulting Physician (Gastroenterology)  Indicate any recent Medical Services you may have  received from other than [facility] providers in the past year (date may be approximate).    Assessment:    Annual Wellness Visit  Therapeutic Anticoagulation  Screening Tests Health Maintenance  Topic Date Due  . COLONOSCOPY  02/26/2017  . MAMMOGRAM  10/02/2019 (Originally 05/04/2014)  . DEXA SCAN  03/15/2017  . INFLUENZA VACCINE  04/03/2017  . TETANUS/TDAP  05/04/2021  . PNA vac Low Risk Adult  Completed        Plan:   During the course of the visit Cheray was educated and counseled about the following appropriate screening and preventive services:   Vaccines to include Pneumoccal, Influenza, Td and Shingles - discussed Shingrix with patient - declined  Colorectal cancer screening - sent referral to GI (history of adenomatous polyp, per PCP recommended GI consult for recommendation of repeat colonoscopy)  Cardiovascular disease screening  Diabetes screening  Bone Denisty / Osteoporosis Screening - ordered due July 2018  Mammogram - declined  Glaucoma screening / Eye Exam  Nutrition counseling  Advanced Directives  Physical Activity  Discussed fall prevention.  I recommended PT for balance assessment and treatment - patient declined.  Continue current warfarin dose.  Recheck INR in 1 month.    Goals    None       Patient Instructions (the written plan) were given to the patient.    Cherre Robins, PharmD   02/27/2017

## 2017-03-05 ENCOUNTER — Ambulatory Visit (HOSPITAL_COMMUNITY)
Admission: RE | Admit: 2017-03-05 | Discharge: 2017-03-05 | Disposition: A | Discharge status: Home / Self Care | Payer: Medicare Other | Source: Ambulatory Visit | Attending: Family Medicine | Admitting: Family Medicine

## 2017-03-05 DIAGNOSIS — I2510 Atherosclerotic heart disease of native coronary artery without angina pectoris: Secondary | ICD-10-CM | POA: Insufficient documentation

## 2017-03-05 DIAGNOSIS — I700 Atherosclerosis of aorta: Secondary | ICD-10-CM | POA: Insufficient documentation

## 2017-03-05 DIAGNOSIS — R911 Solitary pulmonary nodule: Secondary | ICD-10-CM

## 2017-03-05 DIAGNOSIS — R918 Other nonspecific abnormal finding of lung field: Secondary | ICD-10-CM | POA: Insufficient documentation

## 2017-03-07 ENCOUNTER — Encounter: Payer: Self-pay | Admitting: Family Medicine

## 2017-03-13 ENCOUNTER — Telehealth: Payer: Self-pay | Admitting: Family Medicine

## 2017-03-13 NOTE — Telephone Encounter (Signed)
Patients daughter notified of CT results and that last colonoscopy report does state to have it repeated in 5 years which would make her due now. Daughter states that you mentioned last time that she may never need a colonoscopy again after the last one and they would like your opinion on whether she should have one or not. Please advise and route to pool A. Advised you were on vacation and I would send a message.

## 2017-03-15 NOTE — Telephone Encounter (Signed)
Pt notified of Dr Tawanna Sat recommendation Verbalizes understanding

## 2017-03-15 NOTE — Telephone Encounter (Signed)
Please schedule repeat colonoscopy for this patient and let the patient's daughter no of this need and referral. Schedule with previous gastroenterologist.

## 2017-03-19 ENCOUNTER — Other Ambulatory Visit: Payer: Medicare Other

## 2017-03-21 ENCOUNTER — Encounter: Payer: Self-pay | Admitting: Internal Medicine

## 2017-04-03 ENCOUNTER — Ambulatory Visit (INDEPENDENT_AMBULATORY_CARE_PROVIDER_SITE_OTHER): Payer: Medicare Other | Admitting: Pharmacist

## 2017-04-03 ENCOUNTER — Ambulatory Visit (INDEPENDENT_AMBULATORY_CARE_PROVIDER_SITE_OTHER): Payer: Medicare Other

## 2017-04-03 DIAGNOSIS — M8580 Other specified disorders of bone density and structure, unspecified site: Secondary | ICD-10-CM

## 2017-04-03 DIAGNOSIS — I4819 Other persistent atrial fibrillation: Secondary | ICD-10-CM

## 2017-04-03 DIAGNOSIS — M810 Age-related osteoporosis without current pathological fracture: Secondary | ICD-10-CM | POA: Diagnosis not present

## 2017-04-03 DIAGNOSIS — M8588 Other specified disorders of bone density and structure, other site: Secondary | ICD-10-CM

## 2017-04-03 DIAGNOSIS — I481 Persistent atrial fibrillation: Secondary | ICD-10-CM

## 2017-04-03 LAB — COAGUCHEK XS/INR WAIVED
INR: 2.4 — ABNORMAL HIGH (ref 0.9–1.1)
PROTHROMBIN TIME: 28.2 s

## 2017-04-03 MED ORDER — ALENDRONATE SODIUM 70 MG PO TABS: 70.0000 mg | ORAL_TABLET | ORAL | 11 refills | Status: DC

## 2017-04-03 NOTE — Progress Notes (Signed)
Patient ID: Kari [name], female   DOB: 12/24/1933, 83 y.o.   MRN: [id number]     HPI: Patient is here today to have INR rechecked and to have DEXA repeated.  Patient is taking warfarin for anticoaulation secondary to atrial fibrillation.  She is currently taking warfarin 2.5mg  tablets daily except she only take 1/2 tablet on Mondays and Fridays. Patient with osteoporosis - has not been taking any mediation for osteoporosis over the last 2 years.  Back Pain?  No       Kyphosis?  Yes Prior fracture?  Right wrist 03/2014. Fractured foot but she cannot remember when but last osteoporosis visit notes was around 2009. Med(s) for Osteoporosis/Osteopenia:  none Med(s) previously tried for Osteoporosis/Osteopenia:  Took alendronate 70mg  once weekly for about 18 months                                                             PMH: Age at menopause:  40's - surgical Hysterectomy?  Yes Oophorectomy?  Yes HRT? Yes - Former.  Type/duration: premarin Steroid Use?  No Thyroid med?  No History of cancer?  No History of digestive disorders (ie Crohn's)?  No Current or previous eating disorders?  No Last Vitamin D Result:  27.1 (06/19/20180 - patient is taking vitamin D 50,000IU weekly Last GFR Result:  84 (02/19/2017)   FH/SH: Family history of osteoporosis?  No Parent with history of hip fracture?  No Family history of breast cancer?  No Exercise?  Yes - walking daily Smoking?  No Alcohol?  No    Calcium Assessment Calcium Intake  # of servings/day  Calcium mg  Milk (8 oz) 0.5  x  300  = 150mg   Yogurt (4 oz) 0 x  200 = 0  Cheese (1 oz) 0 x  200 = 0  Other Calcium sources   250mg   Ca supplement none = 0   Estimated calcium intake per day 400mg     DEXA Results Date of Test T-Score for AP Spine L1-L4 T-Score for Neck of Left Hip T-Score for Left forearm / Radius  04/03/2017 0.3 -2.5 -4.5  03/16/2015 0.3 -2.3 --  03/11/2013 0.0 -2.2 --  02/28/2011 0.4 -3.0 --  06/21/2010 0.4 -2.7 --   05/12/2018 0.8 -0.9 --  04/22/2006 0.8 -0.8 --   INR was 2.4 today  Assessment: Osteoporosis with decreased BMD and history of fractures Therapeutic anticoagulation  Recommendations: 1.  Start  alendronate (FOSAMAX) 70mg  take 1 tablet weekly 2.  recommend calcium 1200mg  daily through supplementation or diet.  3.  continue weight bearing exercise - 30 minutes at least 4 days per week.   4.  Counseled and educated about fall risk and prevention. 5.  Continue current warfarin dose and recheck in 4 to 6 weeks.  Recheck DEXA:  2 years  Time spent counseling patient:  30 minutes

## 2017-04-03 NOTE — Patient Instructions (Addendum)
Anticoagulation Warfarin Dose Instructions as of 04/03/2017      Kari [name] Tue Wed Thu Fri Sat   New Dose 2.5 mg 1.25 mg 2.5 mg 2.5 mg 2.5 mg 1.25 mg 2.5 mg    Description   Continue current warfarin dose of 2.36m tablets - take 1/2 tablet on Mondays and Fridays.  Take 1 tablet all other days.  INR was 2.4 today    Calcium & Vitamin D: The Facts  Why is calcium and vitamin D consumption important? Calcium: . Most Americans do not consume adequate amounts of calcium! Calcium is required for proper muscle function, nerve communication, bone support, and many other functions in the body.  . The body uses bones as a source of calcium. Bones 'remodel' themselves continuously - the body constantly breaks bone down to release calcium and rebuilds bones by replacing calcium in the bone later.  . As we get older, the rate of bone breakdown occurs faster than bone rebuilding which could lead to osteopenia, osteoporosis, and possible fractures.   Vitamin D: . People naturally make vitamin D in the body when sunlight hits the skin and triggers a process that leads to vitamin D production. This natural vitamin D production requires about 10-15 minutes of sun exposure on the hands, arms, and face at least 2-3 times per week. However, due to decreased sun exposure and the use of sunscreen, most people will need to get additional vitamin D from foods or supplements. Your doctor can measure your body's vitamin D level through a simple blood test to determine your daily vitamin D needs.  . Vitamin D is used to help the body absorb calcium, maintain bone health, help the immune system, and reduce inflammation. It also plays a role in muscle performance, balance and risk of falling.  . Vitamin D deficiency can lead to osteomalacia or softening of the bones, bone pain, and muscle weakness.   The recommended daily allowance of Calcium and Vitamin D varies for different age groups. Age group Calcium (mg) Vitamin D (IU)   Females and Males: Age 620-50 1000 mg 600 IU  Females: Age 81- 891200 mg 600 IU  Males: Age 81-701000 mg 600 IU  Females and Males: Age 61+ 1200 mg 800 IU  Pregnant/lactating Females age 81-501000 mg 600 IU   How much Calcium do you get in your diet? Calcium Intake # of servings per day  Total calcium (mg)  Skim milk, 2% milk (1 cup) _________ x 300 mg   Yogurt (1 small container) _________ x 200 mg   Cheese (1oz) _________ x 200 mg   Cottage Cheese (1 cup)             ________ x 150 mg   Almond milk (1 cup) _________ x 450 mg   Fortified Orange Juice (1 cup) _________ x 300 mg   Broccoli or spinach ( 1 cup) _________ x 100 mg   Salmon (3 oz) _________ x 150 mg    Almonds (1/4 cup) _______ x 90 mg      How do we get Calcium and Vitamin D in our diet? Calcium: . Obtaining calcium from the diet is the most preferred way to reach the recommended daily goal. If this goal is not reached through diet, calcium supplements are available.  . Calcium is found in many foods including: dairy products, dark leafy vegetables (like broccoli, kale, and spinach), fish, and fortified products like juices and cereals.  . The food  label will have a %DV (percent daily value) listed showing the amount of calcium per serving. To determine the total mg per serving, simply replace the % with zero (0).  For example, Almond Breeze almond milk contains 45% DV of calcium or 428m per 1 cup.  . You can increase the amount of calcium in your diet by using more calcium products in your daily meals. Use yogurt and fruit to make smoothies or use yogurt to top baked potatoes or make whipped potatoes. Sprinkle low fat cheese onto salads or into egg white omelets. You can even add non-fat dry milk powder (3048mcalcium per 1/3 cup) to hot cereals, meat loaf, soups, or potatoes.  . Calcium supplements come in many forms including tablets, chewables, and gummies. Be sure to read the label to determine the correct number of  tablets per serving and whether or not to take the supplement with food.  . Calcium carbonate products (Oscal, Caltrate, and Viactiv) are generally better absorbed when taken with food while calcium citrate products like Citracal can be taken with or without food.  . The body can only absorb about 600 mg of calcium at one time. It is recommended to take calcium supplements in small amounts several times per day.  However, taking it all at once is better than not taking it at all. . Increasing your intake of calcium is essential for bone health, but may also lead to some side effects like constipation, increased gas, bloating or abdominal cramping. To help reduce these side effects, start with 1 tablet per day and slowly increase your intake of the supplement to the recommended doses. It is also recommended that you drink plenty of water each day. Vitamin D: . Very few foods naturally contain vitamin D. However, it is found in saltwater fish (like tuna, salmon and mackerel), beef liver, egg yolks, cheese and vitamin D fortified foods (like yogurt, cereals, orange juice and milk) . The amount of vitamin D in each food or product is listed as %DV on the product label. To determine the total amount of vitamin D per serving, drop the % sign and multiply the number by 4. For example, 1 cup of Almond Breeze almond milk contains 25% DV vitamin D or 100 IU per serving (25 x 4 =100). . Vitamin D is also found in multivitamins and supplements and may be listed as ergocalciferol (vitamin D2) or cholecalciferol (vitamin D3). Each of these forms of vitamin D are equivalent and the daily recommended intake will vary based on your age and the vitamin D levels in your body. Follow your doctor's recommendation for vitamin D intake.                    Exercise for Strong Bones  Exercise is important to build and maintain strong bones / bone density.  There are 2 types of exercises that are important to building and  maintaining strong bones:  Weight- bearing and muscle-stregthening.  Weight-bearing Exercises  These exercises include activities that make you move against gravity while staying upright. Weight-bearing exercises can be high-impact or low-impact.  High-impact weight-bearing exercises help build bones and keep them strong. If you have broken a bone due to osteoporosis or are at risk of breaking a bone, you may need to avoid high-impact exercises. If you're not sure, you should check with your healthcare provider.  Examples of high-impact weight-bearing exercises are: Dancing  Doing high-impact aerobics  Hiking  Jogging/running  Jumping Rope  Stair climbing  Tennis  Low-impact weight-bearing exercises can also help keep bones strong and are a safe alternative if you cannot do high-impact exercises.   Examples of low-impact weight-bearing exercises are: Using elliptical training machines  Doing low-impact aerobics  Using stair-step machines  Fast walking on a treadmill or outside   Muscle-Strengthening Exercises These exercises include activities where you move your body, a weight or some other resistance against gravity. They are also known as resistance exercises and include: Lifting weights  Using elastic exercise bands  Using weight machines  Lifting your own body weight  Functional movements, such as standing and rising up on your toes  Yoga and Pilates can also improve strength, balance and flexibility. However, certain positions may not be safe for people with osteoporosis or those at increased risk of broken bones. For example, exercises that have you bend forward may increase the chance of breaking a bone in the spine.   Non-Impact Exercises There are other types of exercises that can help prevent falls.  Non-impact exercises can help you to improve balance, posture and how well you move in everyday activities. Some of these exercises include: Balance exercises that  strengthen your legs and test your balance, such as Tai Chi, can decrease your risk of falls.  Posture exercises that improve your posture and reduce rounded or "sloping" shoulders can help you decrease the chance of breaking a bone, especially in the spine.  Functional exercises that improve how well you move can help you with everyday activities and decrease your chance of falling and breaking a bone. For example, if you have trouble getting up from a chair or climbing stairs, you should do these activities as exercises.   **A physical therapist can teach you balance, posture and functional exercises. He/she can also help you learn which exercises are safe and appropriate for you.  [facility] has a physical therapy office in [place] in front of our office and referrals can be made for assessments and treatment as needed and strength and balance training.  If you would like to have an assessment with [facility] and our physical therapy team please let a nurse or provider know.

## 2017-04-09 NOTE — Progress Notes (Signed)
HPI The patient presents for followup of atrial fibrillation.  Since I last saw her she has had shingles, a fall with trauma and was in the hospital in April with flu.  With this she did not have any obvious cardiac complications.  She is in atrial fib.  She does not feel this.  The patient denies any new symptoms such as chest discomfort, neck or arm discomfort. There has been no new shortness of breath, PND or orthopnea. There have been no reported palpitations, presyncope or syncope.  She is minimally active.  She does do some cooking.    Allergies  Allergen Reactions  . Erythromycin Swelling and Other (See Comments)    Reaction unknown  . Penicillins Other (See Comments)    Reaction unknown  Has patient had a PCN reaction causing immediate rash, facial/tongue/throat swelling, SOB or lightheadedness with hypotension: unknown Has patient had a PCN reaction causing severe rash involving mucus membranes or skin necrosis: unknown Has patient had a PCN reaction that required hospitalization : unknown Has patient had a PCN reaction occurring within the last 10 years: no If all of the above answers are "NO", then may proceed with Cephalosporin use.     Current Outpatient Prescriptions  Medication Sig Dispense Refill  . acetaminophen (TYLENOL) 500 MG tablet Take 1,000 mg by mouth every 6 (six) hours as needed.    Marland Kitchen alendronate (FOSAMAX) 70 MG tablet Take 1 tablet (70 mg total) by mouth once a week. Take with a full glass of water on an empty stomach. 4 tablet 11  . captopril (CAPOTEN) 50 MG tablet TAKE (1) TABLET TWICE A DAY FOR HIGH BLOOD PRESSURE. 60 tablet 5  . ezetimibe (ZETIA) 10 MG tablet TAKE 1 TABLET ONCE DAILY FOR CHOLESTEROL 30 tablet 5  . meloxicam (MOBIC) 15 MG tablet Take 1 tablet (15 mg total) by mouth daily. (Patient taking differently: Take 15 mg by mouth daily as needed. ) 30 tablet 3  . potassium chloride (K-DUR) 10 MEQ tablet Take 1 tablet (10 mEq total) by mouth daily. 30  tablet 5  . pravastatin (PRAVACHOL) 80 MG tablet TAKE 1 TABLET ONCE DAILY FOR CHOLESTEROL 30 tablet 5  . Vitamin D, Ergocalciferol, (DRISDOL) 50000 units CAPS capsule Take 1 capsule (50,000 Units total) by mouth every 7 (seven) days. 12 capsule 1  . warfarin (COUMADIN) 2.5 MG tablet TAKE 1 TABLET ONCE DAILY OR AS DIRECTED 30 tablet 6   No current facility-administered medications for this visit.     Past Medical History:  Diagnosis Date  . Adenomatous polyps   . AF (atrial fibrillation) ([place])   . Cataract   . Gallstones   . Hx of adenomatous colonic polyps   . Hyperlipidemia    x5 years  . Hypertension    x5 years  . Kyphosis   . Metabolic syndrome X   . Obesity   . Osteoporosis   . Symptomatic menopausal or female climacteric states   . Vitamin D deficiency     Past Surgical History:  Procedure Laterality Date  . ABDOMINAL HYSTERECTOMY    . BLADDER SUSPENSION    . COLONOSCOPY  multiple  . EYE SURGERY Bilateral 09/2015  . VAGINAL HYSTERECTOMY     total    ROS:  As stated in the HPI and negative for all other systems.  PHYSICAL EXAM BP (!) 164/80   Pulse 76   Ht 5\' 1"  (1.549 m)   Wt 156 lb (70.8 kg)   BMI 29.48  kg/m   GENERAL:  Well appearing NECK:  No jugular venous distention, waveform within normal limits, carotid upstroke brisk and symmetric, no bruits, no thyromegaly LUNGS:  Clear to auscultation bilaterally BACK:  No CVA tenderness CHEST:  Unremarkable HEART:  PMI not displaced or sustained,S1 and S2 within normal limits, no S3, no clicks, no rubs, no murmurs, irregular ABD:  Flat, positive bowel sounds normal in frequency in pitch, no bruits, no rebound, no guarding, no midline pulsatile mass, no hepatomegaly, no splenomegaly EXT:  Mild edema  EKG:  Atrial fib, rate 76, axis WNL, intervals WNL, no acute ST T wave changes.  04/11/2017  ASSESSMENT AND PLAN  ATRIAL FIBRILLATION:  Ms. ELISANDRA DESHMUKH has a CHA2DS2 - VASc score of 4.  She tolerates  anticoagulation.  No change in therapy is indicated.    HTN:  Her BP is elevated today.  However, her husband takes her BP every night and it is OK less than 140/90.  Given this I will not titrate her meds further.

## 2017-04-10 ENCOUNTER — Ambulatory Visit (INDEPENDENT_AMBULATORY_CARE_PROVIDER_SITE_OTHER): Payer: Medicare Other | Admitting: Cardiology

## 2017-04-10 VITALS — BP 164/80 | HR 76 | Ht 61.0 in | Wt 156.0 lb

## 2017-04-10 DIAGNOSIS — I10 Essential (primary) hypertension: Secondary | ICD-10-CM | POA: Diagnosis not present

## 2017-04-10 DIAGNOSIS — I482 Chronic atrial fibrillation: Secondary | ICD-10-CM | POA: Diagnosis not present

## 2017-04-10 DIAGNOSIS — I4821 Permanent atrial fibrillation: Secondary | ICD-10-CM

## 2017-04-10 NOTE — Patient Instructions (Signed)
Medication Instructions:  The current medical regimen is effective;  continue present plan and medications.  Follow-Up: Follow up in 1 year with Dr. Hochrein in [place].  You will receive a letter in the mail 2 months before you are due.  Please call us when you receive this letter to schedule your follow up appointment.  If you need a refill on your cardiac medications before your next appointment, please call your pharmacy.  Thank you for choosing [facility]!!     

## 2017-04-11 ENCOUNTER — Encounter: Payer: Self-pay | Admitting: Cardiology

## 2017-04-15 ENCOUNTER — Other Ambulatory Visit: Payer: Self-pay | Admitting: Family Medicine

## 2017-04-15 ENCOUNTER — Encounter: Payer: Self-pay | Admitting: Family Medicine

## 2017-04-17 ENCOUNTER — Other Ambulatory Visit: Payer: Self-pay | Admitting: *Deleted

## 2017-04-17 NOTE — Addendum Note (Signed)
Addended by: Talbert Nan on: 04/17/2017 09:27 AM   Modules accepted: Orders

## 2017-05-01 ENCOUNTER — Other Ambulatory Visit: Payer: Self-pay | Admitting: Family Medicine

## 2017-05-11 ENCOUNTER — Telehealth: Payer: Self-pay | Admitting: Family Medicine

## 2017-05-11 MED ORDER — CEPHALEXIN 500 MG PO CAPS: 500.0000 mg | ORAL_CAPSULE | Freq: Three times a day (TID) | ORAL | 0 refills | Status: DC

## 2017-05-11 NOTE — Telephone Encounter (Signed)
Patient of [facility]. Please advise

## 2017-05-11 NOTE — Telephone Encounter (Signed)
Pt with concern for UTI, weekend coverage.   Will send keflex and recommend appt on Monday if symptoms not improving.   Penicillin allergic but has tolerated omnicef and keflex  Laroy Apple, MD [facility] 05/11/2017, 11:18 AM

## 2017-05-11 NOTE — Telephone Encounter (Signed)
Daughter notified that rx sent to pharmacy

## 2017-05-11 NOTE — Telephone Encounter (Signed)
What symptoms do you have? Urine has smell to it  How long have you been sick?since last night  Have you been seen for this problem? no  If your provider decides to give you a prescription, which pharmacy would you like for it to be sent to? [facility], pt is out of town and is not able to make it today   Patient informed that this information will be sent to the clinical staff for review and that they should receive a follow up call.

## 2017-05-13 ENCOUNTER — Ambulatory Visit: Payer: Medicare Other | Admitting: Family Medicine

## 2017-05-13 NOTE — Progress Notes (Signed)
Subjective: CC: urinary symptoms PCP: Chipper Herb, MD Kari [name] is a 83 y.o. female presenting to clinic today for:  1. Urinary symptoms/ GI symptoms Patient reports onset of urine odor and discoloration, 4 ago.  She contacted the on-call provider, Dr. Wendi Snipes, who agreed to send her in [place]. She reports that she had taken this for a couple of days and became nauseated. She just resumed taking medication this morning. Her daughter would like to proceed with a urinalysis and urine culture for completion. Endorses 1 episode of nausea, vomiting and several episodes of diarrhea.  Per her daughter several members of her family have similar symptoms.  She is tolerating PO intake without difficulty and hydrating well.  Denies hematuria,vaginal bleeding or discharge, abdominal pain/ back pain.  She has a history of GBS in her urine in 2017. She's had 2 urine cultures since then but had no growth despite abnormal urinalysis.  Allergies  Allergen Reactions  . Erythromycin Swelling and Other (See Comments)    Reaction unknown  . Penicillins Other (See Comments)    Reaction unknown  Has patient had a PCN reaction causing immediate rash, facial/tongue/throat swelling, SOB or lightheadedness with hypotension: unknown Has patient had a PCN reaction causing severe rash involving mucus membranes or skin necrosis: unknown Has patient had a PCN reaction that required hospitalization : unknown Has patient had a PCN reaction occurring within the last 10 years: no If all of the above answers are "NO", then may proceed with Cephalosporin use.    Past Medical History:  Diagnosis Date  . Adenomatous polyps   . AF (atrial fibrillation) ([place])   . Cataract   . Gallstones   . Hx of adenomatous colonic polyps   . Hyperlipidemia    x5 years  . Hypertension    x5 years  . Kyphosis   . Metabolic syndrome X   . Obesity   . Osteoporosis   . Symptomatic menopausal or female climacteric states     . Vitamin D deficiency    Family History  Problem Relation Age of Onset  . Heart attack Mother   . Stroke Mother   . Heart attack Father   . Alcohol abuse Father   . Hypertension Sister   . Stroke Brother   . Heart attack Brother   . Depression Brother        suicide  . Heart attack Other   . Stroke Other   . Breast cancer Other   . Colon cancer Neg Hx    Social Hx: never smoker.Current medications reviewed.   ROS: Per HPI  Objective: Office vital signs reviewed. BP (!) 158/95   Pulse 98   Temp 97.8 F (36.6 C) (Oral)   Ht 5\' 1"  (1.549 m)   Wt 156 lb (70.8 kg)   BMI 29.48 kg/m   Physical Examination:  General: Awake, alert, well nourished, well appearing female, No acute distress HEENT: Normal    Eyes: sclera white    Throat: moist mucus membranes Pulm: normal work of breathing on room air GI: soft, non-tender, non-distended, bowel sounds present x4, no hepatomegaly, no splenomegaly, no masses GU: no suprapubic TTP MSK: no CVA TTP  No results found for this or any previous visit (from the past 24 hour(s)).  Assessment/ Plan: 83 y.o. female   1. Acute cystitis with hematuria UA with Leuks, bacteria, protein.  Urine micro with RBCs, bacteria, epithelial cells. I do not think that she is actually failed Keflex but  rather has not taken a sufficient course to clear up infection. I reviewed her 3 previous urine cultures. It appears the last 2 were actually negative. Though she does have a history of strep agalactiae on an old urine culture. I have increased her Keflex to 4 times daily until finished. She is essentially asymptomatic except for foul odor to urine. No evidence of pyelonephritis on exam or in history of present illness. I encouraged her to hydrate well. Would consider repeat urinalysis given protein and red blood cells on UA today. Strict return precautions were reviewed with patient and her daughter. - Urinalysis, Complete - Urine Culture  2. Viral  gastroenteritis She has several sick contacts with similar symptoms. I do not think that the nausea and vomiting are related to her urinary tract infection or to the Keflex, as discontinuation of Keflex made no difference in her symptoms. She has not had any recurrent vomiting today. No red flag signs including blood in vomit or stool. She is afebrile with no vital sign abnormalities to suggest systemic infection. I encouraged her to hydrate well, eats small bland meals. Follow up with primary care provider as needed.   Orders Placed This Encounter  Procedures  . Urine Culture  . Urinalysis, Complete   Meds ordered this encounter  Medications  . cephALEXin (KEFLEX) 500 MG capsule    Sig: Take 1 capsule (500 mg total) by mouth 4 (four) times daily.    Dispense:  21 capsule    Refill:  0   Follow up in 1-2 weeks for BP with PCP.  [name], DO [facility] [phone]

## 2017-05-14 ENCOUNTER — Ambulatory Visit (INDEPENDENT_AMBULATORY_CARE_PROVIDER_SITE_OTHER): Payer: Medicare Other | Admitting: Family Medicine

## 2017-05-14 ENCOUNTER — Encounter: Payer: Self-pay | Admitting: Family Medicine

## 2017-05-14 VITALS — BP 158/95 | HR 98 | Temp 97.8°F | Ht 61.0 in | Wt 156.0 lb

## 2017-05-14 DIAGNOSIS — A084 Viral intestinal infection, unspecified: Secondary | ICD-10-CM | POA: Diagnosis not present

## 2017-05-14 DIAGNOSIS — N3001 Acute cystitis with hematuria: Secondary | ICD-10-CM | POA: Diagnosis not present

## 2017-05-14 DIAGNOSIS — B351 Tinea unguium: Secondary | ICD-10-CM | POA: Diagnosis not present

## 2017-05-14 DIAGNOSIS — R300 Dysuria: Secondary | ICD-10-CM | POA: Diagnosis not present

## 2017-05-14 DIAGNOSIS — I70203 Unspecified atherosclerosis of native arteries of extremities, bilateral legs: Secondary | ICD-10-CM | POA: Diagnosis not present

## 2017-05-14 DIAGNOSIS — M79676 Pain in unspecified toe(s): Secondary | ICD-10-CM | POA: Diagnosis not present

## 2017-05-14 DIAGNOSIS — L84 Corns and callosities: Secondary | ICD-10-CM | POA: Diagnosis not present

## 2017-05-14 LAB — MICROSCOPIC EXAMINATION: Epithelial Cells (non renal): >10 /hpf — AB (ref 0–10)

## 2017-05-14 LAB — URINALYSIS, COMPLETE
Bilirubin, UA: NEGATIVE
GLUCOSE, UA: NEGATIVE
Nitrite, UA: NEGATIVE
PH UA: 6 (ref 5.0–7.5)
UUROB: 2 mg/dL — AB (ref 0.2–1.0)

## 2017-05-14 MED ORDER — CEPHALEXIN 500 MG PO CAPS: 500.0000 mg | ORAL_CAPSULE | Freq: Four times a day (QID) | ORAL | 0 refills | Status: DC

## 2017-05-14 NOTE — Patient Instructions (Signed)
You're your and shows persistent evidence of urinary tract infection. As we discussed there were some skin cells in your urine, which may indicate that we did not get an ideal sample of your urine. Nevertheless, this has been sent for culture. I will contact him with those results and advise you if there is a need for change in antibiotic. In the interim, I would like you to increase the cephalexin to 4 times a day until gone. Make sure that you are hydrating well. If you develop fevers, chills, abdominal pain, blood in your urine please seek immediate attention. Otherwise, plan to follow up with your primary care provider as scheduled for blood pressure or as needed.   Urinary Tract Infection, Adult A urinary tract infection (UTI) is an infection of any part of the urinary tract, which includes the kidneys, ureters, bladder, and urethra. These organs make, store, and get rid of urine in the body. UTI can be a bladder infection (cystitis) or kidney infection (pyelonephritis). What are the causes? This infection may be caused by fungi, viruses, or bacteria. Bacteria are the most common cause of UTIs. This condition can also be caused by repeated incomplete emptying of the bladder during urination. What increases the risk? This condition is more likely to develop if:  You ignore your need to urinate or hold urine for long periods of time.  You do not empty your bladder completely during urination.  You wipe back to front after urinating or having a bowel movement, if you are female.  You are uncircumcised, if you are female.  You are constipated.  You have a urinary catheter that stays in place (indwelling).  You have a weak defense (immune) system.  You have a medical condition that affects your bowels, kidneys, or bladder.  You have diabetes.  You take antibiotic medicines frequently or for long periods of time, and the antibiotics no longer work well against certain types of infections  (antibiotic resistance).  You take medicines that irritate your urinary tract.  You are exposed to chemicals that irritate your urinary tract.  You are female.  What are the signs or symptoms? Symptoms of this condition include:  Fever.  Frequent urination or passing small amounts of urine frequently.  Needing to urinate urgently.  Pain or burning with urination.  Urine that smells bad or unusual.  Cloudy urine.  Pain in the lower abdomen or back.  Trouble urinating.  Blood in the urine.  Vomiting or being less hungry than normal.  Diarrhea or abdominal pain.  Vaginal discharge, if you are female.  How is this diagnosed? This condition is diagnosed with a medical history and physical exam. You will also need to provide a urine sample to test your urine. Other tests may be done, including:  Blood tests.  Sexually transmitted disease (STD) testing.  If you have had more than one UTI, a cystoscopy or imaging studies may be done to determine the cause of the infections. How is this treated? Treatment for this condition often includes a combination of two or more of the following:  Antibiotic medicine.  Other medicines to treat less common causes of UTI.  Over-the-counter medicines to treat pain.  Drinking enough water to stay hydrated.  Follow these instructions at home:  Take over-the-counter and prescription medicines only as told by your health care provider.  If you were prescribed an antibiotic, take it as told by your health care provider. Do not stop taking the antibiotic even if you  start to feel better.  Avoid alcohol, caffeine, tea, and carbonated beverages. They can irritate your bladder.  Drink enough fluid to keep your urine clear or pale yellow.  Keep all follow-up visits as told by your health care provider. This is important.  Make sure to: ? Empty your bladder often and completely. Do not hold urine for long periods of time. ? Empty  your bladder before and after sex. ? Wipe from front to back after a bowel movement if you are female. Use each tissue one time when you wipe. Contact a health care provider if:  You have back pain.  You have a fever.  You feel nauseous or vomit.  Your symptoms do not get better after 3 days.  Your symptoms go away and then return. Get help right away if:  You have severe back pain or lower abdominal pain.  You are vomiting and cannot keep down any medicines or water. This information is not intended to replace advice given to you by your health care provider. Make sure you discuss any questions you have with your health care provider. Document Released: 05/30/2005 Document Revised: 02/01/2016 Document Reviewed: 07/11/2015 Elsevier Interactive Patient Education  2017 [employer].

## 2017-05-15 LAB — URINE CULTURE

## 2017-05-17 ENCOUNTER — Encounter: Payer: Self-pay | Admitting: Pharmacist Clinician (PhC)/ Clinical Pharmacy Specialist

## 2017-05-20 ENCOUNTER — Ambulatory Visit (INDEPENDENT_AMBULATORY_CARE_PROVIDER_SITE_OTHER): Payer: Medicare Other | Admitting: *Deleted

## 2017-05-20 DIAGNOSIS — I482 Chronic atrial fibrillation: Secondary | ICD-10-CM | POA: Diagnosis not present

## 2017-05-20 DIAGNOSIS — I4821 Permanent atrial fibrillation: Secondary | ICD-10-CM

## 2017-05-20 LAB — COAGUCHEK XS/INR WAIVED
INR: 3.7 — AB (ref 0.9–1.1)
Prothrombin Time: 44.9 s

## 2017-05-20 NOTE — Progress Notes (Addendum)
Subjective:     Indication: atrial fibrillation Bleeding signs/symptoms: None Thromboembolic signs/symptoms: None  Missed Coumadin doses: None Medication changes: yes - Finished Keflex for UTI yesterday Dietary changes: no Bacterial/viral infection: no Other concerns: no  Has taken coumadin today   Objective:    INR Today: 3.7 Current dose: 2.5 mg daily except 1.25 on Mon and Fri    Assessment:    Supratherapeutic INR for goal of 2-3   Plan:    1. New dose: Hold dose x 2 days and then restart normal dose. 2. Next INR: 2 weeks    Discussed with Chevis Pretty, FNP.  Chong Sicilian, RN  Agreed [place], FNP

## 2017-05-20 NOTE — Patient Instructions (Signed)
Anticoagulation Warfarin Dose Instructions as of 05/20/2017      Kari [name] Tue Wed Thu Fri Sat   New Dose 2.5 mg 1.25 mg 0 mg 0 mg 2.5 mg 1.25 mg 2.5 mg    Description   Hold coumadin tomorrow and Wednesday and the resume usual dose of warfarin 2.5mg  tablets - take 1/2 tablet on Mondays and Fridays.  Take 1 tablet all other days.  INR was 3.7 today, which is a little thin.   Your next appointment is on 06/03/17 at 8:15

## 2017-06-03 ENCOUNTER — Ambulatory Visit (INDEPENDENT_AMBULATORY_CARE_PROVIDER_SITE_OTHER): Payer: Medicare Other | Admitting: *Deleted

## 2017-06-03 DIAGNOSIS — Z23 Encounter for immunization: Secondary | ICD-10-CM | POA: Diagnosis not present

## 2017-06-03 DIAGNOSIS — I482 Chronic atrial fibrillation: Secondary | ICD-10-CM | POA: Diagnosis not present

## 2017-06-03 DIAGNOSIS — I4821 Permanent atrial fibrillation: Secondary | ICD-10-CM

## 2017-06-03 LAB — COAGUCHEK XS/INR WAIVED
INR: 2.1 — AB (ref 0.9–1.1)
PROTHROMBIN TIME: 24.9 s

## 2017-06-03 NOTE — Patient Instructions (Signed)
Anticoagulation Warfarin Dose Instructions as of 06/03/2017      Kari [name] Tue Wed Thu Fri Sat   New Dose 2.5 mg 1.25 mg 2.5 mg 2.5 mg 2.5 mg 1.25 mg 2.5 mg    Description   Continue taking 2.5mg  daily (1 tablet) except for 1.25mg  (1/2 tablet on Mondays and Fridays)  INR was 2.1 which is within your range of 2.0-3.0  Have your protime checked at your next visit with Dr Laurance Flatten

## 2017-06-03 NOTE — Progress Notes (Signed)
Subjective:     Indication: atrial fibrillation Bleeding signs/symptoms: None Thromboembolic signs/symptoms: None  Missed Coumadin doses: held 2 doses 2 weeks ago as instructed Medication changes: no Dietary changes: yes - Started drinking [employer] brand of Boost and it contains Vit K 22.55mcg Bacterial/viral infection: no Other concerns: no  The following portions of the patient's history were reviewed and updated as appropriate: past family history, past medical history, past social history, past surgical history and problem list.  Review of Systems Pertinent items are noted in HPI.   Objective:    INR Today: 2.1 Current dose: 2.5mg  daily except for 1.25 on Mon and Fri  Assessment:    Therapeutic INR for goal of 2-3   Plan:    1. New dose: no change   2. Next INR: 3 weeks at appt with Dr Laurance Flatten  Chong Sicilian, RN  I have discussed the patient's case with Chong Sicilian RN and agree with her documentation as above.   Laroy Apple, MD [facility] 06/03/2017, 4:34 PM

## 2017-06-10 ENCOUNTER — Other Ambulatory Visit: Payer: Self-pay | Admitting: Family Medicine

## 2017-06-24 ENCOUNTER — Encounter: Payer: Self-pay | Admitting: Family Medicine

## 2017-06-24 ENCOUNTER — Ambulatory Visit (INDEPENDENT_AMBULATORY_CARE_PROVIDER_SITE_OTHER): Payer: Medicare Other | Admitting: Family Medicine

## 2017-06-24 ENCOUNTER — Other Ambulatory Visit: Payer: Self-pay | Admitting: Family Medicine

## 2017-06-24 VITALS — BP 155/83 | HR 79 | Temp 97.2°F | Ht 61.0 in | Wt 151.0 lb

## 2017-06-24 DIAGNOSIS — I10 Essential (primary) hypertension: Secondary | ICD-10-CM | POA: Diagnosis not present

## 2017-06-24 DIAGNOSIS — E7800 Pure hypercholesterolemia, unspecified: Secondary | ICD-10-CM | POA: Diagnosis not present

## 2017-06-24 DIAGNOSIS — I481 Persistent atrial fibrillation: Secondary | ICD-10-CM | POA: Diagnosis not present

## 2017-06-24 DIAGNOSIS — I700 Atherosclerosis of aorta: Secondary | ICD-10-CM

## 2017-06-24 DIAGNOSIS — R358 Other polyuria: Secondary | ICD-10-CM | POA: Diagnosis not present

## 2017-06-24 DIAGNOSIS — E559 Vitamin D deficiency, unspecified: Secondary | ICD-10-CM

## 2017-06-24 DIAGNOSIS — Z87440 Personal history of urinary (tract) infections: Secondary | ICD-10-CM | POA: Diagnosis not present

## 2017-06-24 DIAGNOSIS — I4819 Other persistent atrial fibrillation: Secondary | ICD-10-CM

## 2017-06-24 LAB — COAGUCHEK XS/INR WAIVED
INR: 2 — ABNORMAL HIGH (ref 0.9–1.1)
PROTHROMBIN TIME: 24.5 s

## 2017-06-24 NOTE — Addendum Note (Signed)
Addended by: Zannie Cove on: 06/24/2017 12:26 PM   Modules accepted: Orders

## 2017-06-24 NOTE — Patient Instructions (Addendum)
Medicare Annual Wellness Visit  [facility] and the medical providers at [facility] strive to bring you the best medical care.  In doing so we not only want to address your current medical conditions and concerns but also to detect new conditions early and prevent illness, disease and health-related problems.    Medicare offers a yearly Wellness Visit which allows our clinical staff to assess your need for preventative services including immunizations, lifestyle education, counseling to decrease risk of preventable diseases and screening for fall risk and other medical concerns.    This visit is provided free of charge (no copay) for all Medicare recipients. The clinical pharmacists at [facility] have begun to conduct these Wellness Visits which will also include a thorough review of all your medications.    As you primary medical provider recommend that you make an appointment for your Annual Wellness Visit if you have not done so already this year.  You may set up this appointment before you leave today or you may call back ([phone]) and schedule an appointment.  Please make sure when you call that you mention that you are scheduling your Annual Wellness Visit with the clinical pharmacist so that the appointment may be made for the proper length of time.    Continue current medications. Continue good therapeutic lifestyle changes which include good diet and exercise. Fall precautions discussed with patient. If an FOBT was given today- please return it to our front desk. If you are over 36 years old - you may need Prevnar 45 or the adult Pneumonia vaccine.  **Flu shots are available--- please call and schedule a FLU-CLINIC appointment**  After your visit with [place] today you will receive a survey in the mail or online from [employer] regarding your care with [place]. Please take a moment to fill this out. Your feedback is very  important to [place] as you can help [place] better understand your patient needs as well as improve your experience and satisfaction. WE CARE ABOUT YOU!!!  Continue to be careful do not put yourself at risk for falling Continue Coumadin as currently doing and recheck the pro time again in 4 weeks We will call with lab work results including urinalysis results as soon as those results become available If the patient continues to get urinary tract infection she will need to have an appointment to see the urologist. Make sure that she continues to drink plenty of fluids and stays well-hydrated Continue to monitor blood pressures at home and bring these readings with the patient to her next visit Please have your son sign up for my chart

## 2017-06-24 NOTE — Progress Notes (Signed)
Subjective:    Patient ID: Kari [name], female    DOB: 19-Jan-1934, 83 y.o.   MRN: [id number]  HPI  Pt here for follow up and management of chronic medical problems which includes a fib, hyperlipidemia and hypertension. She is taking medication regularly.  The patient is doing well other than recovering from a recent urinary tract infection.  The patient had a CT scan done in July of this year.  There was a small irregular noncalcified right lower lobe nodule with an additional nonspecific 2 mm right upper lobe nodule.  Recommendation was that she should have a repeat CT scan in 18-24 months from the initial CT scan done in January 2018.  This would put the next scan to be somewhere about 1 year from now or in early 2020.  This will be discussed with the patient and her daughter.  The patient as usual denies any particular problems.  Her appetite has been down somewhat and her daughter is giving her some boost every other day or so.  Her weight is slightly decreased.  She did recently have the repeat CT scan in the next one will be due in about 1 year or early 2020.  Both the daughter and the patient were made aware of this and this is because of lung nodules.  The patient denies any chest pain or shortness of breath.  She also does not denies any trouble with swallowing heartburn indigestion nausea vomiting diarrhea or blood in the stool.  She denies any specific symptoms related to the urinary tract but the daughter says she usually gets tired and more lethargic when she has a urinary tract infection.  They are aware of this and this is good.  She is trying to drink more fluids.  Her INR today was good at 2.0 she will continue with current treatment and will reschedule for repeat INR in about 4 weeks.     Patient Active Problem List   Diagnosis Date Noted  . Influenza A 12/06/2016  . Sciatica of right side 04/23/2016  . At high risk for falls 04/23/2016  . Increased BMI 09/30/2015  .  Hyperlipidemia 09/07/2015  . Low back pain 09/07/2015  . Hypoxia 09/06/2015  . Orthostatic syncope 09/04/2015  . Orthostatic hypotension 09/04/2015  . Aortic atherosclerosis ([place]) 05/03/2015  . Vitamin D deficiency 02/02/2015  . Pre-diabetes 02/02/2015  . Metabolic syndrome 76/72/0947  . Osteoporosis 03/11/2013  . Atrial fibrillation ([place]) 11/24/2012  . Hypokalemia 05/04/2012  . Obesity   . Symptomatic menopausal or female climacteric states   . Kyphosis   . Gallstones   . Personal history of adenomatous colonic polyps   . Essential hypertension 03/29/2009   Outpatient Encounter Prescriptions as of 06/24/2017  Medication Sig  . acetaminophen (TYLENOL) 500 MG tablet Take 1,000 mg by mouth every 6 (six) hours as needed.  Marland Kitchen alendronate (FOSAMAX) 70 MG tablet Take 1 tablet (70 mg total) by mouth once a week. Take with a full glass of water on an empty stomach.  . captopril (CAPOTEN) 50 MG tablet TAKE (1) TABLET TWICE A DAY FOR HIGH BLOOD PRESSURE.  Marland Kitchen ezetimibe (ZETIA) 10 MG tablet TAKE 1 TABLET ONCE DAILY FOR CHOLESTEROL  . potassium chloride (K-DUR) 10 MEQ tablet TAKE 1 TABLET DAILY  . pravastatin (PRAVACHOL) 80 MG tablet TAKE 1 TABLET ONCE DAILY FOR CHOLESTEROL  . Vitamin D, Ergocalciferol, (DRISDOL) 50000 units CAPS capsule Take 1 capsule (50,000 Units total) by mouth every 7 (seven) days.  Marland Kitchen  warfarin (COUMADIN) 2.5 MG tablet TAKE 1 TABLET ONCE DAILY OR AS DIRECTED   No facility-administered encounter medications on file as of 06/24/2017.       Review of Systems  Constitutional: Negative.   HENT: Negative.   Eyes: Negative.   Respiratory: Negative.   Cardiovascular: Negative.   Gastrointestinal: Negative.   Endocrine: Negative.   Genitourinary: Negative.        Rck urine today - recent infection  Musculoskeletal: Negative.   Skin: Negative.   Allergic/Immunologic: Negative.   Neurological: Negative.   Hematological: Negative.   Psychiatric/Behavioral: Negative.         Objective:   Physical Exam  Constitutional: She is oriented to person, place, and time. She appears well-developed and well-nourished. No distress.  The patient is pleasant and alert  HENT:  Head: Normocephalic and atraumatic.  Right Ear: External ear normal.  Left Ear: External ear normal.  Nose: Nose normal.  Mouth/Throat: Oropharynx is clear and moist. No oropharyngeal exudate.  Eyes: Pupils are equal, round, and reactive to light. Conjunctivae and EOM are normal. Right eye exhibits no discharge. Left eye exhibits no discharge. No scleral icterus.  Neck: Normal range of motion. Neck supple. No thyromegaly present.  No thyromegaly bruits or adenopathy  Cardiovascular: Normal rate, regular rhythm, normal heart sounds and intact distal pulses.   No murmur heard. The pulses in her feet were present but were difficult to palpate heart has a regular rate and rhythm at 72/min  Pulmonary/Chest: Effort normal and breath sounds normal. No respiratory distress. She has no wheezes. She has no rales.  Clear anteriorly and posteriorly  Abdominal: Soft. Bowel sounds are normal. She exhibits no mass. There is no tenderness. There is no rebound and no guarding.  No abdominal tenderness masses bruits or organ enlargement  Musculoskeletal: Normal range of motion. She exhibits no edema.  Somewhat hesitant with movements but generally normal.  Lymphadenopathy:    She has no cervical adenopathy.  Neurological: She is alert and oriented to person, place, and time. She has normal reflexes. No cranial nerve deficit.  Skin: Skin is warm and dry. No rash noted.  Psychiatric: She has a normal mood and affect. Her behavior is normal. Judgment and thought content normal.  Nursing note and vitals reviewed.    BP (!) 155/83 (BP Location: Left Arm)   Pulse 79   Temp (!) 97.2 F (36.2 C) (Oral)   Ht 5' 1"  (1.549 m)   Wt 151 lb (68.5 kg)   BMI 28.53 kg/m   Home blood pressures have been running in the  606T systolic.     Assessment & Plan:  1. Essential hypertension -The systolic blood pressure was elevated today.  The daughter says that at home it runs in the 130s and this is good.  They will continue to monitor blood pressure at home she will watch her sodium intake and they will try to bring readings to the next visit. - CBC with Differential/Platelet - BMP8+EGFR - Hepatic function panel  2. Persistent atrial fibrillation ([place]) -Heart had a normal sinus rhythm today and her pro time was good at 2.0 she will continue with her current Coumadin treatment with a recheck of the pro time in 4 weeks. - CoaguChek XS/INR Waived - CBC with Differential/Platelet  3. Pure hypercholesterolemia -Continue with Zetia and as aggressive therapeutic lifestyle changes as possible - CBC with Differential/Platelet - Lipid panel  4. Vitamin D deficiency -Continue with vitamin D replacement at 50,000 units weekly  pending results of lab work - CBC with Differential/Platelet - VITAMIN D 25 Hydroxy (Vit-D Deficiency, Fractures)  5. Thoracic aortic atherosclerosis ([place]) -Continue with aggressive therapeutic lifestyle changes pending results of lab work - CBC with Differential/Platelet  6. Recent urinary tract infection -Urinalysis today.  Discussed with patient and her daughter that if she gets recurrent UTIs in an ongoing way that she will need a referral to the urologist. - Urine Culture - Urinalysis, Complete  Patient Instructions                       Medicare Annual Wellness Visit  [facility] and the medical providers at [facility] strive to bring you the best medical care.  In doing so we not only want to address your current medical conditions and concerns but also to detect new conditions early and prevent illness, disease and health-related problems.    Medicare offers a yearly Wellness Visit which allows our clinical staff to assess your need for preventative  services including immunizations, lifestyle education, counseling to decrease risk of preventable diseases and screening for fall risk and other medical concerns.    This visit is provided free of charge (no copay) for all Medicare recipients. The clinical pharmacists at [facility] have begun to conduct these Wellness Visits which will also include a thorough review of all your medications.    As you primary medical provider recommend that you make an appointment for your Annual Wellness Visit if you have not done so already this year.  You may set up this appointment before you leave today or you may call back ([phone]) and schedule an appointment.  Please make sure when you call that you mention that you are scheduling your Annual Wellness Visit with the clinical pharmacist so that the appointment may be made for the proper length of time.    Continue current medications. Continue good therapeutic lifestyle changes which include good diet and exercise. Fall precautions discussed with patient. If an FOBT was given today- please return it to our front desk. If you are over 54 years old - you may need Prevnar 37 or the adult Pneumonia vaccine.  **Flu shots are available--- please call and schedule a FLU-CLINIC appointment**  After your visit with [place] today you will receive a survey in the mail or online from [employer] regarding your care with [place]. Please take a moment to fill this out. Your feedback is very important to [place] as you can help [place] better understand your patient needs as well as improve your experience and satisfaction. WE CARE ABOUT YOU!!!  Continue to be careful do not put yourself at risk for falling Continue Coumadin as currently doing and recheck the pro time again in 4 weeks We will call with lab work results including urinalysis results as soon as those results become available If the patient continues to get urinary tract infection she will need to have an  appointment to see the urologist. Make sure that she continues to drink plenty of fluids and stays well-hydrated Continue to monitor blood pressures at home and bring these readings with the patient to her next visit Please have your son sign up for my chart  Arrie Senate MD

## 2017-06-25 ENCOUNTER — Telehealth: Payer: Self-pay | Admitting: Family Medicine

## 2017-06-25 LAB — BMP8+EGFR
BUN/Creatinine Ratio: 11 — ABNORMAL LOW (ref 12–28)
BUN: 7 mg/dL — ABNORMAL LOW (ref 8–27)
CALCIUM: 9.1 mg/dL (ref 8.7–10.3)
CO2: 23 mmol/L (ref 20–29)
Chloride: 102 mmol/L (ref 96–106)
Creatinine, Ser: 0.62 mg/dL (ref 0.57–1.00)
GFR calc Af Amer: 98 mL/min/{1.73_m2} (ref >59)
GFR calc non Af Amer: 85 mL/min/{1.73_m2} (ref >59)
GLUCOSE: 99 mg/dL (ref 65–99)
POTASSIUM: 4.7 mmol/L (ref 3.5–5.2)
SODIUM: 141 mmol/L (ref 134–144)

## 2017-06-25 LAB — LIPID PANEL
CHOLESTEROL TOTAL: 114 mg/dL (ref 100–199)
Chol/HDL Ratio: 2.8 ratio (ref 0.0–4.4)
HDL: 41 mg/dL (ref >39)
LDL Calculated: 56 mg/dL (ref 0–99)
TRIGLYCERIDES: 86 mg/dL (ref 0–149)
VLDL Cholesterol Cal: 17 mg/dL (ref 5–40)

## 2017-06-25 LAB — CBC WITH DIFFERENTIAL/PLATELET
BASOS ABS: 0 10*3/uL (ref 0.0–0.2)
Basos: 0 %
EOS (ABSOLUTE): 0.1 10*3/uL (ref 0.0–0.4)
EOS: 2 %
HEMATOCRIT: 37.1 % (ref 34.0–46.6)
HEMOGLOBIN: 11.3 g/dL (ref 11.1–15.9)
IMMATURE GRANULOCYTES: 0 %
Immature Grans (Abs): 0 10*3/uL (ref 0.0–0.1)
LYMPHS ABS: 1.3 10*3/uL (ref 0.7–3.1)
Lymphs: 16 %
MCH: 23.2 pg — ABNORMAL LOW (ref 26.6–33.0)
MCHC: 30.5 g/dL — AB (ref 31.5–35.7)
MCV: 76 fL — ABNORMAL LOW (ref 79–97)
MONOCYTES: 8 %
MONOS ABS: 0.6 10*3/uL (ref 0.1–0.9)
NEUTROS PCT: 74 %
Neutrophils Absolute: 5.7 10*3/uL (ref 1.4–7.0)
Platelets: 277 10*3/uL (ref 150–379)
RBC: 4.88 x10E6/uL (ref 3.77–5.28)
RDW: 19 % — AB (ref 12.3–15.4)
WBC: 7.7 10*3/uL (ref 3.4–10.8)

## 2017-06-25 LAB — HEPATIC FUNCTION PANEL
ALBUMIN: 4.1 g/dL (ref 3.5–4.7)
ALK PHOS: 73 IU/L (ref 39–117)
ALT: 9 IU/L (ref 0–32)
AST: 23 IU/L (ref 0–40)
BILIRUBIN TOTAL: 0.6 mg/dL (ref 0.0–1.2)
BILIRUBIN, DIRECT: 0.22 mg/dL (ref 0.00–0.40)
TOTAL PROTEIN: 7.5 g/dL (ref 6.0–8.5)

## 2017-06-25 LAB — VITAMIN D 25 HYDROXY (VIT D DEFICIENCY, FRACTURES): Vit D, 25-Hydroxy: 46 ng/mL (ref 30.0–100.0)

## 2017-06-25 NOTE — Telephone Encounter (Signed)
[name] notified of results Verbalizes understanding Per pt's daughter pt was unable to take Fosamax due to leg pain Do you want to try her on something else Please advise

## 2017-06-25 NOTE — Telephone Encounter (Signed)
Please advise 

## 2017-06-25 NOTE — Telephone Encounter (Signed)
Consider trying the patient on Prolia injections that are given twice yearly.  Please send this consideration to Cyril Mourning and call the patient's daughter [name] and let Cyril Mourning call the patient when she gets all the paperwork completed for doing this.

## 2017-06-26 LAB — URINE CULTURE

## 2017-07-04 DIAGNOSIS — I639 Cerebral infarction, unspecified: Secondary | ICD-10-CM

## 2017-07-04 HISTORY — DX: Cerebral infarction, unspecified: I63.9

## 2017-07-08 ENCOUNTER — Other Ambulatory Visit: Payer: Self-pay | Admitting: Family Medicine

## 2017-07-10 ENCOUNTER — Telehealth: Payer: Self-pay | Admitting: Family Medicine

## 2017-07-10 NOTE — Telephone Encounter (Signed)
Daughter aware of lab results and verbalizes understanding. Also is returning Kristen's call about the Prolia injections. Refer to phone note from 10/23.

## 2017-07-11 NOTE — Telephone Encounter (Signed)
Returned call to patient's daughter but phone was busy.  She wouldn't owe anything if Prolia is ordered through our office and administered here.  I will order one and put her name on it she just needs to be scheduled for the injection. I will be out of the office on 07/12/17 so I will forward this note back to the nursing pool in case her daughter calls back tomorrow.   Chong Sicilian, RN

## 2017-07-17 NOTE — Telephone Encounter (Signed)
Spoke with [name] and she is going to talk with her mother about what day will work best. She will call back to schedule the injection with the nurse. Prolia has been ordered and is in the fridge with her name on it.

## 2017-07-18 DIAGNOSIS — I639 Cerebral infarction, unspecified: Secondary | ICD-10-CM | POA: Diagnosis not present

## 2017-07-18 DIAGNOSIS — R531 Weakness: Secondary | ICD-10-CM | POA: Diagnosis not present

## 2017-07-18 DIAGNOSIS — I4891 Unspecified atrial fibrillation: Secondary | ICD-10-CM | POA: Diagnosis not present

## 2017-07-18 DIAGNOSIS — I10 Essential (primary) hypertension: Secondary | ICD-10-CM | POA: Diagnosis not present

## 2017-07-18 DIAGNOSIS — Z7901 Long term (current) use of anticoagulants: Secondary | ICD-10-CM | POA: Diagnosis not present

## 2017-07-18 DIAGNOSIS — S0990XA Unspecified injury of head, initial encounter: Secondary | ICD-10-CM | POA: Diagnosis not present

## 2017-07-18 DIAGNOSIS — R29810 Facial weakness: Secondary | ICD-10-CM | POA: Diagnosis not present

## 2017-07-18 DIAGNOSIS — G8194 Hemiplegia, unspecified affecting left nondominant side: Secondary | ICD-10-CM | POA: Diagnosis not present

## 2017-07-18 DIAGNOSIS — W19XXXA Unspecified fall, initial encounter: Secondary | ICD-10-CM | POA: Diagnosis not present

## 2017-07-18 DIAGNOSIS — S199XXA Unspecified injury of neck, initial encounter: Secondary | ICD-10-CM | POA: Diagnosis not present

## 2017-07-18 DIAGNOSIS — S299XXA Unspecified injury of thorax, initial encounter: Secondary | ICD-10-CM | POA: Diagnosis not present

## 2017-07-18 DIAGNOSIS — E785 Hyperlipidemia, unspecified: Secondary | ICD-10-CM | POA: Diagnosis not present

## 2017-07-18 DIAGNOSIS — R29708 NIHSS score 8: Secondary | ICD-10-CM | POA: Diagnosis not present

## 2017-07-19 DIAGNOSIS — R29711 NIHSS score 11: Secondary | ICD-10-CM | POA: Diagnosis not present

## 2017-07-19 DIAGNOSIS — R29818 Other symptoms and signs involving the nervous system: Secondary | ICD-10-CM | POA: Diagnosis not present

## 2017-07-19 DIAGNOSIS — I481 Persistent atrial fibrillation: Secondary | ICD-10-CM | POA: Diagnosis not present

## 2017-07-19 DIAGNOSIS — Z7901 Long term (current) use of anticoagulants: Secondary | ICD-10-CM | POA: Diagnosis not present

## 2017-07-19 DIAGNOSIS — G8194 Hemiplegia, unspecified affecting left nondominant side: Secondary | ICD-10-CM | POA: Insufficient documentation

## 2017-07-19 DIAGNOSIS — I639 Cerebral infarction, unspecified: Secondary | ICD-10-CM | POA: Diagnosis not present

## 2017-07-19 DIAGNOSIS — Z79899 Other long term (current) drug therapy: Secondary | ICD-10-CM | POA: Diagnosis not present

## 2017-07-19 DIAGNOSIS — Z90710 Acquired absence of both cervix and uterus: Secondary | ICD-10-CM | POA: Diagnosis not present

## 2017-07-19 DIAGNOSIS — Z87440 Personal history of urinary (tract) infections: Secondary | ICD-10-CM | POA: Diagnosis not present

## 2017-07-19 DIAGNOSIS — I6340 Cerebral infarction due to embolism of unspecified cerebral artery: Secondary | ICD-10-CM | POA: Diagnosis not present

## 2017-07-19 DIAGNOSIS — R29701 NIHSS score 1: Secondary | ICD-10-CM | POA: Diagnosis not present

## 2017-07-19 DIAGNOSIS — I482 Chronic atrial fibrillation: Secondary | ICD-10-CM | POA: Diagnosis not present

## 2017-07-19 DIAGNOSIS — I6990 Unspecified sequelae of unspecified cerebrovascular disease: Secondary | ICD-10-CM | POA: Diagnosis not present

## 2017-07-19 DIAGNOSIS — Z823 Family history of stroke: Secondary | ICD-10-CM | POA: Diagnosis not present

## 2017-07-19 DIAGNOSIS — I10 Essential (primary) hypertension: Secondary | ICD-10-CM | POA: Diagnosis present

## 2017-07-19 DIAGNOSIS — R4182 Altered mental status, unspecified: Secondary | ICD-10-CM | POA: Diagnosis not present

## 2017-07-19 DIAGNOSIS — M6281 Muscle weakness (generalized): Secondary | ICD-10-CM | POA: Diagnosis not present

## 2017-07-19 DIAGNOSIS — I4891 Unspecified atrial fibrillation: Secondary | ICD-10-CM | POA: Diagnosis not present

## 2017-07-21 ENCOUNTER — Telehealth: Payer: Self-pay | Admitting: Internal Medicine

## 2017-07-21 NOTE — Telephone Encounter (Signed)
Received a telephone call that Mrs. Kari [name] was admitted to [facility] with multi-infarct stroke.  INR was therapeutic on admission, however she has known chronic atrial fibrillation on warfarin.  Currently her anticoagulation is being held, however the team wishes to switch her to a direct oral anticoagulant.  I agree with these recommendations and would prefer Eliquis.  Dosing as per medication guidelines based on age, weight and renal function.  We will copy Dr. Percival Spanish on this note.  Follow-up with him after discharge in [place], [place].  Pixie Casino, MD, [facility], [place] Director of the Advanced Lipid Disorders &  Cardiovascular Risk Reduction Clinic Attending Cardiologist  Direct Dial: [phone]  Fax: [phone]  Website:  [email or web address]

## 2017-07-22 ENCOUNTER — Telehealth: Payer: Self-pay | Admitting: Family Medicine

## 2017-07-22 MED ORDER — MORPHINE SULFATE (PF) 2 MG/ML IV SOLN
2.00 | INTRAVENOUS | Status: DC
Start: ? — End: 2017-07-22

## 2017-07-22 MED ORDER — ATORVASTATIN CALCIUM 40 MG PO TABS
40.00 | ORAL_TABLET | ORAL | Status: DC
Start: 2017-07-22 — End: 2017-07-22

## 2017-07-22 MED ORDER — LIDOCAINE HCL (PF) 1 % IJ SOLN
.50 | INTRAMUSCULAR | Status: DC
Start: ? — End: 2017-07-22

## 2017-07-22 MED ORDER — SODIUM CHLORIDE 0.9 % IJ SOLN
5.00 | INTRAMUSCULAR | Status: DC
Start: ? — End: 2017-07-22

## 2017-07-22 MED ORDER — SODIUM CHLORIDE 0.9 % IJ SOLN
5.00 | INTRAMUSCULAR | Status: DC
Start: 2017-07-22 — End: 2017-07-22

## 2017-07-22 MED ORDER — ATENOLOL 50 MG PO TABS
50.00 | ORAL_TABLET | ORAL | Status: DC
Start: 2017-07-23 — End: 2017-07-22

## 2017-07-22 MED ORDER — GENERIC EXTERNAL MEDICATION
6.25 | Status: DC
Start: ? — End: 2017-07-22

## 2017-07-22 MED ORDER — MELATONIN 1 MG PO TABS
3.00 | ORAL_TABLET | ORAL | Status: DC
Start: 2017-07-22 — End: 2017-07-22

## 2017-07-22 MED ORDER — ACETAMINOPHEN 650 MG RE SUPP
650.00 | RECTAL | Status: DC
Start: ? — End: 2017-07-22

## 2017-07-22 MED ORDER — BISACODYL 10 MG RE SUPP
10.00 | RECTAL | Status: DC
Start: ? — End: 2017-07-22

## 2017-07-22 NOTE — Telephone Encounter (Signed)
Number given to Dr. Rosezella [place] office

## 2017-07-24 ENCOUNTER — Telehealth: Payer: Self-pay

## 2017-07-24 DIAGNOSIS — I639 Cerebral infarction, unspecified: Secondary | ICD-10-CM

## 2017-07-24 NOTE — Telephone Encounter (Signed)
Daughter said patient needs a referral to [facility]

## 2017-07-24 NOTE — Telephone Encounter (Signed)
It is okay to do this referral to [facility] because the patient was recently treated there for stroke symptoms.

## 2017-07-29 ENCOUNTER — Other Ambulatory Visit: Payer: Self-pay

## 2017-07-29 ENCOUNTER — Encounter: Payer: Medicare Other | Admitting: *Deleted

## 2017-07-29 ENCOUNTER — Telehealth: Payer: Self-pay

## 2017-07-29 DIAGNOSIS — I639 Cerebral infarction, unspecified: Secondary | ICD-10-CM

## 2017-07-29 DIAGNOSIS — M6281 Muscle weakness (generalized): Secondary | ICD-10-CM | POA: Diagnosis not present

## 2017-07-29 DIAGNOSIS — I6930 Unspecified sequelae of cerebral infarction: Secondary | ICD-10-CM | POA: Diagnosis not present

## 2017-07-29 NOTE — Telephone Encounter (Signed)
son said she does not need INR's anymore  Put her on a new med since stroke

## 2017-07-29 NOTE — Addendum Note (Signed)
Addended by: Zannie Cove on: 07/29/2017 10:16 AM   Modules accepted: Orders

## 2017-07-29 NOTE — Telephone Encounter (Signed)
Spoke with son - he wants neurologist in [place].  Referral placed

## 2017-07-29 NOTE — Telephone Encounter (Signed)
Patient started on Eliquis by physician at [facility] s/p CVA. Removed patient from anticoagulation tracking and removed coumadin from medication list.

## 2017-07-30 ENCOUNTER — Encounter: Payer: Self-pay | Admitting: Nurse Practitioner

## 2017-07-30 ENCOUNTER — Ambulatory Visit (INDEPENDENT_AMBULATORY_CARE_PROVIDER_SITE_OTHER): Payer: Medicare Other | Admitting: Nurse Practitioner

## 2017-07-30 ENCOUNTER — Ambulatory Visit: Payer: Medicare Other | Admitting: Family Medicine

## 2017-07-30 VITALS — BP 143/73 | HR 105 | Temp 97.1°F | Ht 61.0 in | Wt 153.0 lb

## 2017-07-30 DIAGNOSIS — J40 Bronchitis, not specified as acute or chronic: Secondary | ICD-10-CM

## 2017-07-30 DIAGNOSIS — I6930 Unspecified sequelae of cerebral infarction: Secondary | ICD-10-CM | POA: Diagnosis not present

## 2017-07-30 MED ORDER — HYDROCODONE-HOMATROPINE 5-1.5 MG/5ML PO SYRP: 5.0000 mL | ORAL_SOLUTION | Freq: Four times a day (QID) | ORAL | 0 refills | Status: DC | PRN

## 2017-07-30 NOTE — Patient Instructions (Signed)

## 2017-07-30 NOTE — Progress Notes (Addendum)
Subjective:     Kari [name] is a 83 y.o. female here for evaluation of a cough. Onset of symptoms was 4 days ago. Symptoms have been gradually worsening since that time. The cough is barky, dry and nonproductive and is aggravated by nothing. Associated symptoms include: none. Patient does not have a history of asthma. Patient does not have a history of environmental allergens. Patient has not traveled recently. Patient does not have a history of smoking. Patient has had a previous chest x-ray. Patient has had a PPD done.  The following portions of the patient's history were reviewed and updated as appropriate: allergies, current medications, past family history, past medical history, past social history, past surgical history and problem list.  * patient did have a stroke 1 week ago and has appointment with neurologist coming up. She is doing well but needs order for PT.  Review of Systems Pertinent items noted in HPI and remainder of comprehensive ROS otherwise negative.    Objective:     BP (!) 143/73   Pulse (!) 105   Temp (!) 97.1 F (36.2 C) (Oral)   Ht 5\' 1"  (1.549 m)   Wt 153 lb (69.4 kg)   BMI 28.91 kg/m  General appearance: alert and cooperative Eyes: conjunctivae/corneas clear. PERRL, EOM's intact. Fundi benign. Ears: normal TM's and external ear canals both ears Nose: Nares normal. Septum midline. Mucosa normal. No drainage or sinus tenderness. Throat: lips, mucosa, and tongue normal; teeth and gums normal Neck: no adenopathy, no carotid bruit, no JVD, supple, symmetrical, trachea midline and thyroid not enlarged, symmetric, no tenderness/mass/nodules Lungs: dry cough Heart: regular rate and rhythm, S1, S2 normal, no murmur, click, rub or gallop    Assessment:    Acute Bronchitis   S/P CVA   Plan:     1. Take meds as prescribed 2. Use a cool mist humidifier especially during the winter months and when heat has been humid. 3. Use saline nose sprays frequently 4.  Saline irrigations of the nose can be very helpful if done frequently.  * 4X daily for 1 week*  * Use of a nettie pot can be helpful with this. Follow directions with this* 5. Drink plenty of fluids 6. Keep thermostat turn down low 7.For any cough or congestion  Use plain Mucinex- regular strength or max strength is fine   * Children- consult with Pharmacist for dosing 8. For fever or aces or pains- take tylenol or ibuprofen appropriate for age and weight.  * for fevers greater than 101 orally you may alternate ibuprofen and tylenol every  3 hours.   Meds ordered this encounter  Medications  . HYDROcodone-homatropine (HYCODAN) 5-1.5 MG/5ML syrup    Sig: Take 5 mLs by mouth every 6 (six) hours as needed for cough.    Dispense:  120 mL    Refill:  0    Order Specific Question:   Supervising Provider    Answer:   [facility] records reviewed Orders sent pt   Mary-Margaret Hassell Done, FNP

## 2017-07-31 ENCOUNTER — Telehealth: Payer: Self-pay | Admitting: *Deleted

## 2017-07-31 NOTE — Telephone Encounter (Signed)
VM from [facility] Pt would like to do her PT here, can an order be placed Please advise

## 2017-07-31 NOTE — Telephone Encounter (Signed)
Please order physical therapy here with order from whomever ordered it in the first place.  It may been ordered by the neurologist because of her recent stroke.

## 2017-08-01 ENCOUNTER — Encounter: Payer: Self-pay | Admitting: Physician Assistant

## 2017-08-01 ENCOUNTER — Ambulatory Visit (INDEPENDENT_AMBULATORY_CARE_PROVIDER_SITE_OTHER): Payer: Medicare Other | Admitting: Physician Assistant

## 2017-08-01 VITALS — BP 116/72 | HR 87 | Ht 61.0 in | Wt 154.2 lb

## 2017-08-01 DIAGNOSIS — Z7901 Long term (current) use of anticoagulants: Secondary | ICD-10-CM | POA: Diagnosis not present

## 2017-08-01 DIAGNOSIS — I10 Essential (primary) hypertension: Secondary | ICD-10-CM | POA: Diagnosis not present

## 2017-08-01 DIAGNOSIS — I482 Chronic atrial fibrillation: Secondary | ICD-10-CM

## 2017-08-01 DIAGNOSIS — I4821 Permanent atrial fibrillation: Secondary | ICD-10-CM

## 2017-08-01 DIAGNOSIS — I639 Cerebral infarction, unspecified: Secondary | ICD-10-CM

## 2017-08-01 NOTE — Telephone Encounter (Signed)
Spoke with MMM who recently seen her= she will place order for in home PT .

## 2017-08-01 NOTE — Patient Instructions (Signed)
Your physician recommends that you continue on your current medications as directed. Please refer to the Current Medication list given to you today.   Your physician recommends that you schedule a follow-up appointment in:  [place]

## 2017-08-01 NOTE — Telephone Encounter (Signed)
PT order signed and will be faxed to [facility]

## 2017-08-01 NOTE — Progress Notes (Signed)
Cardiology Office Note   Date:  08/01/2017   ID:  Kari [name], DOB 1934-05-08, MRN [id number]  PCP:  Chipper Herb, MD  Cardiologist: Dr. Percival Spanish, 04/10/2017 Rosaria Ferries, PA-C   Chief Complaint  Patient presents with  . Hospitalization Follow-up    History of Present Illness: Kari [name] is a 83 y.o. female with a history of multi-infarct CVA, perm afib on coumadin>>Eliquis, HLD, HTN, Obesity, vit D deficient  Admitted in [place] from [facility] hospital 11/16-11/19 for CVA w/ L hemiparesis. Coumadin changed to Eliquis, metoprolol tartrate 50 mg bid changed to atenolol 50 mg qd, permissive HTN, no sig residual deficits.  Kari [name] presents for cardiology follow up.  Pt was checking into a hotel the week before T-day. She fell (no sig injuries), but needed EMS. 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 D/c on Monday.   She is tolerating the Eliquis well. Has not missed any doses. Her son makes sure she takes her medications.   Her activity level is at baseline. She was using a walker at first, but it got in her way. She wants to do PT, is trying to arrange that through her PCP.   She is never aware of her A fib, never gets palpitations, never light-headed or dizzy.   She has been coughing this week, saw her PCP this week. No fevers but had some yellowish-brown sputum.   No lower extremity edema, no orthopnea, no PND.  She enjoys getting out and going places but it does not walk that much.  However, no shortness of breath with exertion.   Past Medical History:  Diagnosis Date  . Adenomatous polyps   . AF (atrial fibrillation) ([place])   . Cataract   . Gallstones   . Hx of adenomatous colonic polyps   . Hyperlipidemia    x5 years  . Hypertension    x5 years  . Kyphosis   . Metabolic syndrome X   . Obesity   . Osteoporosis   . Symptomatic menopausal or female climacteric states    . Vitamin D deficiency     Past Surgical History:  Procedure Laterality Date  . ABDOMINAL HYSTERECTOMY    . BLADDER SUSPENSION    . COLONOSCOPY  multiple  . EYE SURGERY Bilateral 09/2015  . VAGINAL HYSTERECTOMY     total    Current Outpatient Medications  Medication Sig Dispense Refill  . apixaban (ELIQUIS) 5 MG TABS tablet Take 1 tablet by mouth 2 (two) times daily.    . captopril (CAPOTEN) 50 MG tablet TAKE (1) TABLET TWICE A DAY FOR HIGH BLOOD PRESSURE. 60 tablet 5  . ezetimibe (ZETIA) 10 MG tablet TAKE 1 TABLET ONCE DAILY FOR CHOLESTEROL 90 tablet 1  . HYDROcodone-homatropine (HYCODAN) 5-1.5 MG/5ML syrup Take 5 mLs by mouth every 6 (six) hours as needed for cough. 120 mL 0  . potassium chloride (K-DUR) 10 MEQ tablet TAKE 1 TABLET DAILY 30 tablet 5  . pravastatin (PRAVACHOL) 80 MG tablet TAKE 1 TABLET ONCE DAILY FOR CHOLESTEROL 30 tablet 2  . Vitamin D, Ergocalciferol, (DRISDOL) 50000 units CAPS capsule TAKE 1 CAPSULE ONCE A WEEK 12 capsule 0   No current facility-administered medications for this visit.     Allergies:   Erythromycin and Penicillins    Social History:  The patient  reports that  has never smoked. she has never used smokeless tobacco. She reports that she does not  drink alcohol or use drugs.   Family History:  The patient's family history includes Alcohol abuse in her father; Breast cancer in her other; Depression in her brother; Heart attack in her brother, father, mother, and other; Hypertension in her sister; Stroke in her brother, mother, and other.    ROS:  Please see the history of present illness. All other systems are reviewed and negative.    PHYSICAL EXAM: VS:  BP 116/72   Pulse 87   Ht 5\' 1"  (1.549 m)   Wt 154 lb 3.2 oz (69.9 kg)   BMI 29.14 kg/m  , BMI Body mass index is 29.14 kg/m. GEN: Well nourished, well developed, female in no acute distress  HEENT: normal for age  Neck: no JVD, no carotid bruit, no masses Cardiac: Irreg R&R; no  murmur, no rubs, or gallops Respiratory:  clear to auscultation bilaterally, normal work of breathing GI: soft, nontender, nondistended, + BS MS: no deformity or atrophy; no edema; distal pulses are 2+ in all 4 extremities   Skin: warm and dry, no rash Neuro:  Strength and sensation are intact Psych: euthymic mood, full affect   EKG:  EKG is ordered today. The ekg ordered today demonstrates atrial fibrillation, heart rate 87, no acute ischemic changes   Recent Labs: 06/24/2017: ALT 9; BUN 7; Creatinine, Ser 0.62; Hemoglobin 11.3; Platelets 277; Potassium 4.7; Sodium 141    Lipid Panel    Component Value Date/Time   CHOL 114 06/24/2017 1156   CHOL 123 03/25/2013 0938   TRIG 86 06/24/2017 1156   TRIG 93 01/04/2016 0836   TRIG 104 03/25/2013 0938   HDL 41 06/24/2017 1156   HDL 39 (L) 01/04/2016 0836   HDL 41 03/25/2013 0938   CHOLHDL 2.8 06/24/2017 1156   LDLCALC 56 06/24/2017 1156   LDLCALC 68 01/06/2014 0940   LDLCALC 61 03/25/2013 0938     Wt Readings from Last 3 Encounters:  08/01/17 154 lb 3.2 oz (69.9 kg)  07/30/17 153 lb (69.4 kg)  06/24/17 151 lb (68.5 kg)     Other studies Reviewed: Additional studies/ records that were reviewed today include: Office notes, hospital records and testing.  ASSESSMENT AND PLAN:  1.  Permanent atrial fibrillation: Her heart rate seems to be generally well controlled.  She has recovered well from her CVA.  She does not have any significant deficits.  She is not on rate control medications.  As long as she is asymptomatic, no changes.  2.  Hypertension: Her blood pressure is normal today.  Notes from her hospitalization mention permissive hypertension.  Her blood pressure at her PCP office 2 days ago was 143/73.  She is compliant with her medications, and asymptomatic.  No med changes right now.  3.  Chronic anticoagulation: She is tolerating the Eliquis and has no bleeding issues.   Current medicines are reviewed at length with  the patient today.  The patient does not have concerns regarding medicines.  The following changes have been made:  no change  Labs/ tests ordered today include:   Orders Placed This Encounter  Procedures  . EKG 12-Lead     Disposition:   FU with Dr. Percival Spanish  Signed, Rosaria Ferries, PA-C  08/01/2017 1:22 PM    [facility] Phone: [phone]; Fax: [phone]  This note was written with the assistance of speech recognition software. Please excuse any transcriptional errors.

## 2017-08-03 ENCOUNTER — Ambulatory Visit (INDEPENDENT_AMBULATORY_CARE_PROVIDER_SITE_OTHER): Payer: Medicare Other | Admitting: Family Medicine

## 2017-08-03 VITALS — BP 176/98 | HR 87 | Temp 97.4°F | Ht 61.0 in | Wt 152.0 lb

## 2017-08-03 DIAGNOSIS — I6930 Unspecified sequelae of cerebral infarction: Secondary | ICD-10-CM

## 2017-08-03 DIAGNOSIS — R404 Transient alteration of awareness: Secondary | ICD-10-CM | POA: Diagnosis not present

## 2017-08-03 DIAGNOSIS — R300 Dysuria: Secondary | ICD-10-CM | POA: Diagnosis not present

## 2017-08-03 MED ORDER — CIPROFLOXACIN HCL 250 MG PO TABS: 250.0000 mg | ORAL_TABLET | Freq: Two times a day (BID) | ORAL | 0 refills | Status: DC

## 2017-08-03 NOTE — Progress Notes (Signed)
Subjective:  Patient ID: Kari [name], female    DOB: [birth year]  Age: 83 y.o. MRN: [id number]  CC: Altered Mental Status (Son thinks she has a UTI)   HPI Kari [name] presents for recheck of confusion.  Yesterday she was rather confused and son felt that she might have a urinary tract infection.  Has been hospitalized in the past for confusion that turned out to be a UTI on 2 different occasions.  Of note is that she was hospitalized with a stroke 2 weeks ago.  [place] to [facility] transferred to [facility] and started on Coumadin.  Subsequently she was changed over to Eliquis.  Later that week she was changed over to Eliquis.  A couple of days ago she had some nausea related to the use of Eliquis.  That seems to have resolved but she continues to take the Eliquis.  The confusion seems to have resolved as well.  She is not having dysuria or frequency or urgency.  Today she is conversant without any confusion or other symptoms.  Her son is here with her and gives the majority of the history however the patient is available for his history as well.  She is able to relate orientation for person place and time.  And denies basic review of systems symptoms as noted below.  Depression screen [facility] 2/9 08/03/2017 07/30/2017 06/24/2017  Decreased Interest 0 0 0  Down, Depressed, Hopeless 0 0 0  PHQ - 2 Score 0 0 0    History Kari [name] has a past medical history of Adenomatous polyps, AF (atrial fibrillation) ([facility]), Cataract, Gallstones, adenomatous colonic polyps, Hyperlipidemia, Hypertension, Kyphosis, Metabolic syndrome X, Obesity, Osteoporosis, Symptomatic menopausal or female climacteric states, and Vitamin D deficiency.   She has a past surgical history that includes Vaginal hysterectomy; Bladder suspension; Colonoscopy (multiple); Eye surgery (Bilateral, 09/2015); and Abdominal hysterectomy.   Her family history includes Alcohol abuse in her father; Breast cancer in  her other; Depression in her brother; Heart attack in her brother, father, mother, and other; Hypertension in her sister; Stroke in her brother, mother, and other.She reports that  has never smoked. she has never used smokeless tobacco. She reports that she does not drink alcohol or use drugs.    ROS Review of Systems  Constitutional: Negative for activity change, appetite change and fever.  HENT: Negative for congestion, rhinorrhea and sore throat.   Eyes: Negative for visual disturbance.  Respiratory: Negative for cough and shortness of breath.   Cardiovascular: Negative for chest pain and palpitations.  Gastrointestinal: Positive for nausea. Negative for abdominal pain and diarrhea.  Genitourinary: Negative for dysuria.  Musculoskeletal: Negative for arthralgias, gait problem and myalgias.  Neurological: Negative for speech difficulty, weakness and numbness.  Psychiatric/Behavioral: Positive for confusion and decreased concentration.    Objective:  BP (!) 176/98   Pulse 87   Temp (!) 97.4 F (36.3 C) (Oral)   Ht 5\' 1"  (1.549 m)   Wt 152 lb (68.9 kg)   BMI 28.72 kg/m   BP Readings from Last 3 Encounters:  08/03/17 (!) 176/98  08/01/17 116/72  07/30/17 (!) 143/73    Wt Readings from Last 3 Encounters:  08/03/17 152 lb (68.9 kg)  08/01/17 154 lb 3.2 oz (69.9 kg)  07/30/17 153 lb (69.4 kg)     Physical Exam  Constitutional: She is oriented to person, place, and time. She appears well-developed and well-nourished. No distress.  HENT:  Head: Normocephalic  and atraumatic.  Right Ear: External ear normal.  Left Ear: External ear normal.  Nose: Nose normal.  Mouth/Throat: Oropharynx is clear and moist.  Eyes: Conjunctivae and EOM are normal. Pupils are equal, round, and reactive to light.  Neck: Normal range of motion. Neck supple. No thyromegaly present.  Cardiovascular: Normal rate, regular rhythm and normal heart sounds.  No murmur heard. Pulmonary/Chest: Effort  normal and breath sounds normal. No respiratory distress. She has no wheezes. She has no rales.  Abdominal: Soft. Bowel sounds are normal. She exhibits no distension. There is no tenderness.  Lymphadenopathy:    She has no cervical adenopathy.  Neurological: She is alert and oriented to person, place, and time. She has normal reflexes.  Skin: Skin is warm and dry.  Psychiatric: She has a normal mood and affect. Her behavior is normal. Judgment and thought content normal.      Assessment & Plan:   Kari [name] was seen today for altered mental status.  Diagnoses and all orders for this visit:  Dysuria -     Urinalysis -     Urine Culture  Transient alteration of awareness  Other orders -     ciprofloxacin (CIPRO) 250 MG tablet; Take 1 tablet (250 mg total) by mouth 2 (two) times daily.       I am having Kari [name] start on ciprofloxacin. I am also having her maintain her ezetimibe, pravastatin, potassium chloride, captopril, Vitamin D (Ergocalciferol), apixaban, and HYDROcodone-homatropine.  Allergies as of 08/03/2017      Reactions   Erythromycin Swelling, Other (See Comments)   Reaction unknown   Penicillins Other (See Comments)   Reaction unknown  Has patient had a PCN reaction causing immediate rash, facial/tongue/throat swelling, SOB or lightheadedness with hypotension: unknown Has patient had a PCN reaction causing severe rash involving mucus membranes or skin necrosis: unknown Has patient had a PCN reaction that required hospitalization : unknown Has patient had a PCN reaction occurring within the last 10 years: no If all of the above answers are "NO", then may proceed with Cephalosporin use.      Medication List        Accurate as of 08/03/17  1:35 PM. Always use your most recent med list.          captopril 50 MG tablet Commonly known as:  CAPOTEN TAKE (1) TABLET TWICE A DAY FOR HIGH BLOOD PRESSURE.   ciprofloxacin 250 MG tablet Commonly known as:   CIPRO Take 1 tablet (250 mg total) by mouth 2 (two) times daily.   ELIQUIS 5 MG Tabs tablet Generic drug:  apixaban Take 1 tablet by mouth 2 (two) times daily.   ezetimibe 10 MG tablet Commonly known as:  ZETIA TAKE 1 TABLET ONCE DAILY FOR CHOLESTEROL   HYDROcodone-homatropine 5-1.5 MG/5ML syrup Commonly known as:  HYCODAN Take 5 mLs by mouth every 6 (six) hours as needed for cough.   potassium chloride 10 MEQ tablet Commonly known as:  K-DUR TAKE 1 TABLET DAILY   pravastatin 80 MG tablet Commonly known as:  PRAVACHOL TAKE 1 TABLET ONCE DAILY FOR CHOLESTEROL   Vitamin D (Ergocalciferol) 50000 units Caps capsule Commonly known as:  DRISDOL TAKE 1 CAPSULE ONCE A WEEK        Follow-up: She has upcoming follow-up with her neurologist based on her recent stroke.  She also has upcoming follow-up with her primary Dr. Laurance Flatten.  She should follow-up with me or the on-call physician as needed should her mental  status change or new symptoms of stroke arise.  Claretta Fraise, M.D.

## 2017-08-04 LAB — URINE CULTURE

## 2017-08-05 ENCOUNTER — Telehealth: Payer: Self-pay | Admitting: Family Medicine

## 2017-08-05 DIAGNOSIS — M6281 Muscle weakness (generalized): Secondary | ICD-10-CM | POA: Diagnosis not present

## 2017-08-05 DIAGNOSIS — R296 Repeated falls: Secondary | ICD-10-CM | POA: Diagnosis not present

## 2017-08-05 DIAGNOSIS — R2681 Unsteadiness on feet: Secondary | ICD-10-CM | POA: Diagnosis not present

## 2017-08-05 DIAGNOSIS — Z8673 Personal history of transient ischemic attack (TIA), and cerebral infarction without residual deficits: Secondary | ICD-10-CM | POA: Diagnosis not present

## 2017-08-05 LAB — URINALYSIS
BILIRUBIN UA: NEGATIVE
GLUCOSE, UA: NEGATIVE
Nitrite, UA: NEGATIVE
PH UA: 7.5 (ref 5.0–7.5)
Specific Gravity, UA: 1.015 (ref 1.005–1.030)
UUROB: 4 mg/dL — AB (ref 0.2–1.0)

## 2017-08-05 NOTE — Telephone Encounter (Signed)
[facility] called - atenolol pulling in from med rec

## 2017-08-06 DIAGNOSIS — L84 Corns and callosities: Secondary | ICD-10-CM | POA: Diagnosis not present

## 2017-08-06 DIAGNOSIS — B351 Tinea unguium: Secondary | ICD-10-CM | POA: Diagnosis not present

## 2017-08-06 DIAGNOSIS — I70203 Unspecified atherosclerosis of native arteries of extremities, bilateral legs: Secondary | ICD-10-CM | POA: Diagnosis not present

## 2017-08-06 DIAGNOSIS — M79676 Pain in unspecified toe(s): Secondary | ICD-10-CM | POA: Diagnosis not present

## 2017-08-07 ENCOUNTER — Ambulatory Visit: Payer: Medicare Other | Admitting: Diagnostic Neuroimaging

## 2017-08-08 DIAGNOSIS — M6281 Muscle weakness (generalized): Secondary | ICD-10-CM | POA: Diagnosis not present

## 2017-08-08 DIAGNOSIS — R296 Repeated falls: Secondary | ICD-10-CM | POA: Diagnosis not present

## 2017-08-08 DIAGNOSIS — Z8673 Personal history of transient ischemic attack (TIA), and cerebral infarction without residual deficits: Secondary | ICD-10-CM | POA: Diagnosis not present

## 2017-08-08 DIAGNOSIS — R2681 Unsteadiness on feet: Secondary | ICD-10-CM | POA: Diagnosis not present

## 2017-08-13 DIAGNOSIS — R296 Repeated falls: Secondary | ICD-10-CM | POA: Diagnosis not present

## 2017-08-13 DIAGNOSIS — Z8673 Personal history of transient ischemic attack (TIA), and cerebral infarction without residual deficits: Secondary | ICD-10-CM | POA: Diagnosis not present

## 2017-08-13 DIAGNOSIS — M6281 Muscle weakness (generalized): Secondary | ICD-10-CM | POA: Diagnosis not present

## 2017-08-13 DIAGNOSIS — R2681 Unsteadiness on feet: Secondary | ICD-10-CM | POA: Diagnosis not present

## 2017-08-15 DIAGNOSIS — M6281 Muscle weakness (generalized): Secondary | ICD-10-CM | POA: Diagnosis not present

## 2017-08-15 DIAGNOSIS — Z8673 Personal history of transient ischemic attack (TIA), and cerebral infarction without residual deficits: Secondary | ICD-10-CM | POA: Diagnosis not present

## 2017-08-15 DIAGNOSIS — R2681 Unsteadiness on feet: Secondary | ICD-10-CM | POA: Diagnosis not present

## 2017-08-15 DIAGNOSIS — R296 Repeated falls: Secondary | ICD-10-CM | POA: Diagnosis not present

## 2017-08-19 DIAGNOSIS — R296 Repeated falls: Secondary | ICD-10-CM | POA: Diagnosis not present

## 2017-08-19 DIAGNOSIS — Z8673 Personal history of transient ischemic attack (TIA), and cerebral infarction without residual deficits: Secondary | ICD-10-CM | POA: Diagnosis not present

## 2017-08-19 DIAGNOSIS — R2681 Unsteadiness on feet: Secondary | ICD-10-CM | POA: Diagnosis not present

## 2017-08-19 DIAGNOSIS — M6281 Muscle weakness (generalized): Secondary | ICD-10-CM | POA: Diagnosis not present

## 2017-08-21 DIAGNOSIS — R296 Repeated falls: Secondary | ICD-10-CM | POA: Diagnosis not present

## 2017-08-21 DIAGNOSIS — M6281 Muscle weakness (generalized): Secondary | ICD-10-CM | POA: Diagnosis not present

## 2017-08-21 DIAGNOSIS — Z8673 Personal history of transient ischemic attack (TIA), and cerebral infarction without residual deficits: Secondary | ICD-10-CM | POA: Diagnosis not present

## 2017-08-21 DIAGNOSIS — R2681 Unsteadiness on feet: Secondary | ICD-10-CM | POA: Diagnosis not present

## 2017-08-24 ENCOUNTER — Other Ambulatory Visit: Payer: Self-pay

## 2017-08-24 ENCOUNTER — Encounter (HOSPITAL_COMMUNITY): Payer: Self-pay | Admitting: Emergency Medicine

## 2017-08-24 ENCOUNTER — Inpatient Hospital Stay (HOSPITAL_COMMUNITY)
Admission: EM | Admit: 2017-08-24 | Discharge: 2017-08-26 | DRG: 087 | Disposition: A | Discharge status: Home / Self Care | Payer: Medicare Other | Attending: Neurosurgery | Admitting: Neurosurgery

## 2017-08-24 ENCOUNTER — Emergency Department (HOSPITAL_COMMUNITY): Payer: Medicare Other

## 2017-08-24 DIAGNOSIS — Z803 Family history of malignant neoplasm of breast: Secondary | ICD-10-CM

## 2017-08-24 DIAGNOSIS — M810 Age-related osteoporosis without current pathological fracture: Secondary | ICD-10-CM | POA: Diagnosis present

## 2017-08-24 DIAGNOSIS — E785 Hyperlipidemia, unspecified: Secondary | ICD-10-CM | POA: Diagnosis present

## 2017-08-24 DIAGNOSIS — R220 Localized swelling, mass and lump, head: Secondary | ICD-10-CM | POA: Diagnosis not present

## 2017-08-24 DIAGNOSIS — Z049 Encounter for examination and observation for unspecified reason: Secondary | ICD-10-CM | POA: Diagnosis not present

## 2017-08-24 DIAGNOSIS — Z7901 Long term (current) use of anticoagulants: Secondary | ICD-10-CM | POA: Diagnosis not present

## 2017-08-24 DIAGNOSIS — Y92242 Post office as the place of occurrence of the external cause: Secondary | ICD-10-CM

## 2017-08-24 DIAGNOSIS — S065X9A Traumatic subdural hemorrhage with loss of consciousness of unspecified duration, initial encounter: Secondary | ICD-10-CM | POA: Diagnosis not present

## 2017-08-24 DIAGNOSIS — Y929 Unspecified place or not applicable: Secondary | ICD-10-CM | POA: Diagnosis not present

## 2017-08-24 DIAGNOSIS — I4891 Unspecified atrial fibrillation: Secondary | ICD-10-CM | POA: Diagnosis present

## 2017-08-24 DIAGNOSIS — Z8673 Personal history of transient ischemic attack (TIA), and cerebral infarction without residual deficits: Secondary | ICD-10-CM

## 2017-08-24 DIAGNOSIS — E559 Vitamin D deficiency, unspecified: Secondary | ICD-10-CM | POA: Diagnosis present

## 2017-08-24 DIAGNOSIS — W01198A Fall on same level from slipping, tripping and stumbling with subsequent striking against other object, initial encounter: Secondary | ICD-10-CM | POA: Diagnosis not present

## 2017-08-24 DIAGNOSIS — S065XAA Traumatic subdural hemorrhage with loss of consciousness status unknown, initial encounter: Secondary | ICD-10-CM | POA: Diagnosis present

## 2017-08-24 DIAGNOSIS — S01112A Laceration without foreign body of left eyelid and periocular area, initial encounter: Secondary | ICD-10-CM | POA: Diagnosis not present

## 2017-08-24 DIAGNOSIS — R930 Abnormal findings on diagnostic imaging of skull and head, not elsewhere classified: Secondary | ICD-10-CM | POA: Diagnosis not present

## 2017-08-24 DIAGNOSIS — M25562 Pain in left knee: Secondary | ICD-10-CM | POA: Diagnosis not present

## 2017-08-24 DIAGNOSIS — I10 Essential (primary) hypertension: Secondary | ICD-10-CM | POA: Diagnosis present

## 2017-08-24 DIAGNOSIS — Z823 Family history of stroke: Secondary | ICD-10-CM

## 2017-08-24 DIAGNOSIS — S065X0A Traumatic subdural hemorrhage without loss of consciousness, initial encounter: Principal | ICD-10-CM | POA: Diagnosis present

## 2017-08-24 DIAGNOSIS — Y939 Activity, unspecified: Secondary | ICD-10-CM | POA: Diagnosis not present

## 2017-08-24 DIAGNOSIS — S0003XA Contusion of scalp, initial encounter: Secondary | ICD-10-CM | POA: Diagnosis not present

## 2017-08-24 DIAGNOSIS — Z8249 Family history of ischemic heart disease and other diseases of the circulatory system: Secondary | ICD-10-CM

## 2017-08-24 DIAGNOSIS — S0990XA Unspecified injury of head, initial encounter: Secondary | ICD-10-CM | POA: Diagnosis not present

## 2017-08-24 DIAGNOSIS — I6200 Nontraumatic subdural hemorrhage, unspecified: Secondary | ICD-10-CM | POA: Diagnosis not present

## 2017-08-24 DIAGNOSIS — Z7982 Long term (current) use of aspirin: Secondary | ICD-10-CM | POA: Diagnosis not present

## 2017-08-24 HISTORY — DX: Cerebral infarction, unspecified: I63.9

## 2017-08-24 LAB — BASIC METABOLIC PANEL
Anion gap: 9 (ref 5–15)
BUN: 6 mg/dL (ref 6–20)
CHLORIDE: 101 mmol/L (ref 101–111)
CO2: 25 mmol/L (ref 22–32)
CREATININE: 0.54 mg/dL (ref 0.44–1.00)
Calcium: 8.8 mg/dL — ABNORMAL LOW (ref 8.9–10.3)
Glucose, Bld: 103 mg/dL — ABNORMAL HIGH (ref 65–99)
Potassium: 3.6 mmol/L (ref 3.5–5.1)
SODIUM: 135 mmol/L (ref 135–145)

## 2017-08-24 LAB — CBC WITH DIFFERENTIAL/PLATELET
BASOS PCT: 0 %
Basophils Absolute: 0 10*3/uL (ref 0.0–0.1)
EOS ABS: 0.2 10*3/uL (ref 0.0–0.7)
EOS PCT: 2 %
HCT: 38.1 % (ref 36.0–46.0)
HEMOGLOBIN: 11.4 g/dL — AB (ref 12.0–15.0)
LYMPHS ABS: 1.8 10*3/uL (ref 0.7–4.0)
Lymphocytes Relative: 20 %
MCH: 23.7 pg — AB (ref 26.0–34.0)
MCHC: 29.9 g/dL — AB (ref 30.0–36.0)
MCV: 79 fL (ref 78.0–100.0)
Monocytes Absolute: 0.7 10*3/uL (ref 0.1–1.0)
Monocytes Relative: 7 %
Neutro Abs: 6.4 10*3/uL (ref 1.7–7.7)
Neutrophils Relative %: 71 %
PLATELETS: 254 10*3/uL (ref 150–400)
RBC: 4.82 MIL/uL (ref 3.87–5.11)
RDW: 18.1 % — ABNORMAL HIGH (ref 11.5–15.5)
WBC: 9.1 10*3/uL (ref 4.0–10.5)

## 2017-08-24 LAB — PROTIME-INR
INR: 1.21
PROTHROMBIN TIME: 15.2 s (ref 11.4–15.2)

## 2017-08-24 MED ORDER — CLEVIDIPINE BUTYRATE 0.5 MG/ML IV EMUL
0.0000 mg/h | INTRAVENOUS | Status: DC
  Administered 2017-08-24: 1 mg/h via INTRAVENOUS
  Filled 2017-08-24: qty 50

## 2017-08-24 MED ORDER — CIPROFLOXACIN HCL 500 MG PO TABS: 250.0000 mg | ORAL_TABLET | Freq: Two times a day (BID) | ORAL | Status: DC

## 2017-08-24 MED ORDER — SENNOSIDES-DOCUSATE SODIUM 8.6-50 MG PO TABS
1.0000 | ORAL_TABLET | Freq: Two times a day (BID) | ORAL | Status: DC
  Administered 2017-08-25 – 2017-08-26 (×2): 1 via ORAL
  Filled 2017-08-24 (×2): qty 1

## 2017-08-24 MED ORDER — POTASSIUM CHLORIDE CRYS ER 10 MEQ PO TBCR
10.0000 meq | EXTENDED_RELEASE_TABLET | Freq: Every day | ORAL | Status: DC
  Administered 2017-08-25 – 2017-08-26 (×2): 10 meq via ORAL
  Filled 2017-08-24 (×2): qty 1

## 2017-08-24 MED ORDER — ATENOLOL 50 MG PO TABS
50.0000 mg | ORAL_TABLET | Freq: Every day | ORAL | Status: DC
  Administered 2017-08-25 – 2017-08-26 (×2): 50 mg via ORAL
  Filled 2017-08-24 (×2): qty 1

## 2017-08-24 MED ORDER — PANTOPRAZOLE SODIUM 40 MG IV SOLR
40.0000 mg | Freq: Every day | INTRAVENOUS | Status: DC
  Administered 2017-08-25: 40 mg via INTRAVENOUS
  Filled 2017-08-24: qty 40

## 2017-08-24 MED ORDER — TRAMADOL HCL 50 MG PO TABS: 50.0000 mg | ORAL_TABLET | Freq: Four times a day (QID) | ORAL | Status: DC | PRN

## 2017-08-24 MED ORDER — ACETAMINOPHEN 325 MG PO TABS: 650.0000 mg | ORAL_TABLET | ORAL | Status: DC | PRN

## 2017-08-24 MED ORDER — CAPTOPRIL 25 MG PO TABS
25.0000 mg | ORAL_TABLET | Freq: Two times a day (BID) | ORAL | Status: DC
  Administered 2017-08-25 – 2017-08-26 (×3): 25 mg via ORAL
  Filled 2017-08-24 (×4): qty 1

## 2017-08-24 MED ORDER — ACETAMINOPHEN 650 MG RE SUPP: 650.0000 mg | RECTAL | Status: DC | PRN

## 2017-08-24 MED ORDER — ACETAMINOPHEN 160 MG/5ML PO SOLN: 650.0000 mg | ORAL | Status: DC | PRN

## 2017-08-24 MED ORDER — VITAMIN D (ERGOCALCIFEROL) 1.25 MG (50000 UNIT) PO CAPS
50000.0000 [IU] | ORAL_CAPSULE | ORAL | Status: DC
  Administered 2017-08-25: 50000 [IU] via ORAL
  Filled 2017-08-24: qty 1

## 2017-08-24 MED ORDER — LABETALOL HCL 5 MG/ML IV SOLN
20.0000 mg | Freq: Once | INTRAVENOUS | Status: AC
  Administered 2017-08-24: 20 mg via INTRAVENOUS
  Filled 2017-08-24: qty 4

## 2017-08-24 MED ORDER — HYDROMORPHONE HCL 1 MG/ML IJ SOLN: 0.5000 mg | INTRAMUSCULAR | Status: DC | PRN

## 2017-08-24 MED ORDER — PRAVASTATIN SODIUM 40 MG PO TABS
80.0000 mg | ORAL_TABLET | Freq: Every day | ORAL | Status: DC
  Filled 2017-08-24: qty 2

## 2017-08-24 MED ORDER — EZETIMIBE 10 MG PO TABS
10.0000 mg | ORAL_TABLET | Freq: Every day | ORAL | Status: DC
  Administered 2017-08-25 – 2017-08-26 (×2): 10 mg via ORAL
  Filled 2017-08-24 (×2): qty 1

## 2017-08-24 NOTE — ED Provider Notes (Signed)
[place] EMERGENCY DEPARTMENT Provider Note   CSN: [id number] Arrival date & time: 08/24/17  3976     History   Chief Complaint No chief complaint on file.   HPI Kari [name] is a 83 y.o. female.  The history is provided by the patient and medical records.  Head Injury   The incident occurred 3 to 5 hours ago. She came to the ER via EMS. The injury mechanism was a direct blow. There was no loss of consciousness. There was no blood loss. The patient is experiencing no pain. Pertinent negatives include no numbness, no blurred vision, no vomiting, no tinnitus, no disorientation, no weakness and no memory loss. She has tried nothing for the symptoms.    Past Medical History:  Diagnosis Date  . Adenomatous polyps   . AF (atrial fibrillation) ([place])   . Cataract   . Gallstones   . Hx of adenomatous colonic polyps   . Hyperlipidemia    x5 years  . Hypertension    x5 years  . Kyphosis   . Metabolic syndrome X   . Obesity   . Osteoporosis   . Symptomatic menopausal or female climacteric states   . Vitamin D deficiency     Patient Active Problem List   Diagnosis Date Noted  . CVA (cerebral vascular accident) ([place]) 07/29/2017  . Influenza A 12/06/2016  . Sciatica of right side 04/23/2016  . At high risk for falls 04/23/2016  . Increased BMI 09/30/2015  . Hyperlipidemia 09/07/2015  . Low back pain 09/07/2015  . Hypoxia 09/06/2015  . Orthostatic syncope 09/04/2015  . Orthostatic hypotension 09/04/2015  . Aortic atherosclerosis ([place]) 05/03/2015  . Vitamin D deficiency 02/02/2015  . Pre-diabetes 02/02/2015  . Metabolic syndrome 73/41/9379  . Osteoporosis 03/11/2013  . Atrial fibrillation ([place]) 11/24/2012  . Hypokalemia 05/04/2012  . Obesity   . Symptomatic menopausal or female climacteric states   . Kyphosis   . Gallstones   . Personal history of adenomatous colonic polyps   . Essential hypertension 03/29/2009    Past Surgical History:    Procedure Laterality Date  . ABDOMINAL HYSTERECTOMY    . BLADDER SUSPENSION    . COLONOSCOPY  multiple  . EYE SURGERY Bilateral 09/2015  . VAGINAL HYSTERECTOMY     total    OB History    No data available       Home Medications    Prior to Admission medications   Medication Sig Start Date End Date Taking? Authorizing Provider  apixaban (ELIQUIS) 5 MG TABS tablet Take 1 tablet by mouth 2 (two) times daily. 07/22/17   [provider]  atenolol (TENORMIN) 50 MG tablet Take 50 mg by mouth daily.    [provider]  captopril (CAPOTEN) 50 MG tablet TAKE (1) TABLET TWICE A DAY FOR HIGH BLOOD PRESSURE. 06/25/17   Chipper Herb, MD  ciprofloxacin (CIPRO) 250 MG tablet Take 1 tablet (250 mg total) by mouth 2 (two) times daily. 08/03/17   Claretta Fraise, MD  ezetimibe (ZETIA) 10 MG tablet TAKE 1 TABLET ONCE DAILY FOR CHOLESTEROL 04/16/17   Chipper Herb, MD  HYDROcodone-homatropine [facility]) 5-1.5 MG/5ML syrup Take 5 mLs by mouth every 6 (six) hours as needed for cough. 07/30/17   Chevis Pretty, FNP  potassium chloride (K-DUR) 10 MEQ tablet TAKE 1 TABLET DAILY 06/10/17   Chipper Herb, MD  pravastatin (PRAVACHOL) 80 MG tablet TAKE 1 TABLET ONCE DAILY FOR CHOLESTEROL 05/02/17   Redge Gainer  W, MD  Vitamin D, Ergocalciferol, (DRISDOL) 50000 units CAPS capsule TAKE 1 CAPSULE ONCE A WEEK 07/08/17   Chipper Herb, MD    Family History Family History  Problem Relation Age of Onset  . Heart attack Mother   . Stroke Mother   . Heart attack Father   . Alcohol abuse Father   . Hypertension Sister   . Stroke Brother   . Heart attack Brother   . Depression Brother        suicide  . Heart attack Other   . Stroke Other   . Breast cancer Other   . Colon cancer Neg Hx     Social History Social History   Tobacco Use  . Smoking status: Never Smoker  . Smokeless tobacco: Never Used  Substance Use Topics  . Alcohol use: No  . Drug use: No     Allergies    Erythromycin and Penicillins   Review of Systems Review of Systems  Constitutional: Negative for chills and fever.  HENT: Negative for dental problem, ear pain, nosebleeds and tinnitus.   Eyes: Negative for blurred vision and visual disturbance.  Respiratory: Negative for cough and shortness of breath.   Cardiovascular: Negative for chest pain.  Gastrointestinal: Negative for abdominal pain, nausea and vomiting.  Genitourinary: Negative for dysuria.  Musculoskeletal: Negative for arthralgias, back pain and neck pain.  Skin: Positive for wound.  Allergic/Immunologic: Negative for immunocompromised state.  Neurological: Negative for seizures, syncope, weakness, numbness and headaches.  Hematological: Bruises/bleeds easily (on Eliquis).  Psychiatric/Behavioral: Negative for confusion and memory loss.  All other systems reviewed and are negative.    Physical Exam Updated Vital Signs There were no vitals taken for this visit.  Physical Exam  Constitutional: She is oriented to person, place, and time. She appears well-developed and well-nourished. No distress.  HENT:  Head: Normocephalic.  Bruising to left temple/forehead w/o underlying crepitus  Eyes: Conjunctivae and EOM are normal. Pupils are equal, round, and reactive to light.  Neck: Normal range of motion. Neck supple.  No midline cervical TTP or step-offs  Cardiovascular: Normal rate and regular rhythm.  No murmur heard. Pulmonary/Chest: Effort normal and breath sounds normal. No respiratory distress.  Abdominal: Soft. There is no tenderness.  Musculoskeletal: She exhibits no edema.  Neurological: She is alert and oriented to person, place, and time.  CN 2-12 grossly intact, 5/5 strength in all 4ext, no focal sensory deficits, gait testing deferred  Skin: Skin is warm and dry.  Psychiatric: She has a normal mood and affect.  Nursing note and vitals reviewed.    ED Treatments / Results  Labs (all labs ordered are  listed, but only abnormal results are displayed) Labs Reviewed - No data to display  EKG  EKG Interpretation None       Radiology No results found.  Procedures Procedures (including critical care time)  Medications Ordered in ED Medications - No data to display   Initial Impression / Assessment and Plan / ED Course  I have reviewed the triage vital signs and the nursing notes.  Pertinent labs & imaging results that were available during my care of the patient were reviewed by me and considered in my medical decision making (see chart for details).     Pt with h/o Afib on Eliquis presents as a transfer from OSH for specialist consultation. [place], tripped over an irregularity on the floor, and fell striking her left temple/forehead; denies LOC, neck/back  pain. Seen at an OSH where CT head showed no abnormalities, but on CT face they noticed a 9mm left SDH. Pt sent here for [facility] consultation.  VS & exam as above. CT head w/Small acute subdural hematoma over the left convexity measuring 54mm in thickness at the level of the temporal lobe and 63mm in thickness at the level of the frontal horns. Very minimal local mass effect and no significant midline shift. Possible punctate focus of acute subarachnoid hemorrhage over the right posterior parietal region. Labs unremarkable.  [facility] consulted and evaluated the Pt in the ED; will admit her to their service for further evaluation and treatment.  Final Clinical Impressions(s) / ED Diagnoses   Final diagnoses:  Subdural hematoma [facility])    ED Discharge Orders    None       Jenny Reichmann, MD 08/24/17 2344    Elnora Morrison, MD 08/25/17 [phone]

## 2017-08-24 NOTE — ED Triage Notes (Signed)
Pt transferred from [facility] in [facility] with a subdural hematoma, pt tripped at post office this [place], hitting head-- bruise over left eye, no LOC.  On arrival, pt is alert/oriented x 4, MEA X 4,

## 2017-08-24 NOTE — H&P (Signed)
Kari [name] is an 83 y.o. female.   Chief Complaint: Subdural hematoma  HPI:  83 year old female with known history of frequent falls and who continues to be on anticoagulation Suffered another fall today.  No loss of consciousness.  No history of numbness, paresthesias or weakness.  No history of seizure.  Patient did note some swelling around her left eye.  Patient reported to local emergency room where a CT scan demonstrated evidence of a subdural hemorrhage.  Patient is chronically on Eliquis for stroke prophylaxis secondary to history of atrial fibrillation.  Transferred to [facility] for further evaluation.  Past Medical History:  Diagnosis Date  . Adenomatous polyps   . AF (atrial fibrillation) ([place])   . Cataract   . Gallstones   . Hx of adenomatous colonic polyps   . Hyperlipidemia    x5 years  . Hypertension    x5 years  . Kyphosis   . Metabolic syndrome X   . Obesity   . Osteoporosis   . Stroke ([place])   . Symptomatic menopausal or female climacteric states   . Vitamin D deficiency     Past Surgical History:  Procedure Laterality Date  . ABDOMINAL HYSTERECTOMY    . BLADDER SUSPENSION    . COLONOSCOPY  multiple  . EYE SURGERY Bilateral 09/2015  . VAGINAL HYSTERECTOMY     total    Family History  Problem Relation Age of Onset  . Heart attack Mother   . Stroke Mother   . Heart attack Father   . Alcohol abuse Father   . Hypertension Sister   . Stroke Brother   . Heart attack Brother   . Depression Brother        suicide  . Heart attack Other   . Stroke Other   . Breast cancer Other   . Colon cancer Neg Hx    Social History:  reports that  has never smoked. she has never used smokeless tobacco. She reports that she does not drink alcohol or use drugs.  Allergies:  Allergies  Allergen Reactions  . Erythromycin Swelling and Other (See Comments)    Reaction unknown  . Penicillins Other (See Comments)    Reaction unknown  Has patient had a PCN  reaction causing immediate rash, facial/tongue/throat swelling, SOB or lightheadedness with hypotension: unknown Has patient had a PCN reaction causing severe rash involving mucus membranes or skin necrosis: unknown Has patient had a PCN reaction that required hospitalization : unknown Has patient had a PCN reaction occurring within the last 10 years: no If all of the above answers are "NO", then may proceed with Cephalosporin use.      (Not in a hospital admission)  Results for orders placed or performed during the hospital encounter of 08/24/17 (from the past 48 hour(s))  CBC with Differential     Status: Abnormal   Collection Time: 08/24/17  7:15 PM  Result Value Ref Range   WBC 9.1 4.0 - 10.5 K/uL   RBC 4.82 3.87 - 5.11 MIL/uL   Hemoglobin 11.4 (L) 12.0 - 15.0 g/dL   HCT 38.1 36.0 - 46.0 %   MCV 79.0 78.0 - 100.0 fL   MCH 23.7 (L) 26.0 - 34.0 pg   MCHC 29.9 (L) 30.0 - 36.0 g/dL   RDW 18.1 (H) 11.5 - 15.5 %   Platelets 254 150 - 400 K/uL   Neutrophils Relative % 71 %   Neutro Abs 6.4 1.7 - 7.7 K/uL   Lymphocytes  Relative 20 %   Lymphs Abs 1.8 0.7 - 4.0 K/uL   Monocytes Relative 7 %   Monocytes Absolute 0.7 0.1 - 1.0 K/uL   Eosinophils Relative 2 %   Eosinophils Absolute 0.2 0.0 - 0.7 K/uL   Basophils Relative 0 %   Basophils Absolute 0.0 0.0 - 0.1 K/uL  Basic metabolic panel     Status: Abnormal   Collection Time: 08/24/17  7:15 PM  Result Value Ref Range   Sodium 135 135 - 145 mmol/L   Potassium 3.6 3.5 - 5.1 mmol/L   Chloride 101 101 - 111 mmol/L   CO2 25 22 - 32 mmol/L   Glucose, Bld 103 (H) 65 - 99 mg/dL   BUN 6 6 - 20 mg/dL   Creatinine, Ser 0.54 0.44 - 1.00 mg/dL   Calcium 8.8 (L) 8.9 - 10.3 mg/dL   GFR calc non Af Amer >60 >60 mL/min   GFR calc Af Amer >60 >60 mL/min    Comment: (NOTE) The eGFR has been calculated using the CKD EPI equation. This calculation has not been validated in all clinical situations. eGFR's persistently <60 mL/min signify possible  Chronic Kidney Disease.    Anion gap 9 5 - 15  Protime-INR     Status: None   Collection Time: 08/24/17  7:15 PM  Result Value Ref Range   Prothrombin Time 15.2 11.4 - 15.2 seconds   INR 1.21    Ct Head Wo Contrast  Result Date: 08/24/2017 CLINICAL DATA:  Subdural hematoma at outside hospital today after trip and fall this morning hitting head. EXAM: CT HEAD WITHOUT CONTRAST TECHNIQUE: Contiguous axial images were obtained from the base of the skull through the vertex without intravenous contrast. COMPARISON:  06/08/2016 FINDINGS: Brain: The ventricles and cisterns are within normal. There is minimal age related atrophic change. There is mild chronic ischemic microvascular disease. Prominent CSF space over the periphery of the right cerebellar hemisphere. Suggestion of an old high small left parietal infarct. There is a small acute left convexity subdural hematoma. This measures 1.4 cm in greatest thickness at the level of the left temporal lobe and measures 7 mm in thickness at the level of the frontal horns. There is very minimal associated local mass effect. No evidence of midline shift. Possible punctate focus of acute subarachnoid hemorrhage over the right posterior parietal region. Vascular: No hyperdense vessel or unexpected calcification. Skull: Normal. Negative for fracture or focal lesion. Sinuses/Orbits: No acute finding. Other: None. IMPRESSION: Small acute subdural hematoma over the left convexity measuring 14 mm in thickness at the level of the temporal lobe and 7 mm in thickness at the level of the frontal horns. Very minimal local mass effect and no significant midline shift. Possible punctate focus of acute subarachnoid hemorrhage over the right posterior parietal region. Chronic ischemic microvascular disease and mild age related atrophic change. Small old high left parietal infarct. Critical Value/emergent results were called by telephone at the time of interpretation on 08/24/2017 at  8:17 pm to Dr. Elnora Morrison , who verbally acknowledged these results. Electronically Signed   By: Marin Olp M.D.   On: 08/24/2017 20:20    Pertinent items noted in HPI and remainder of comprehensive ROS otherwise negative.  SpO2 98 %.  Patient is awake and alert.  She is oriented and reasonably appropriate.  Her speech is fluent.  Her judgment and insight appear intact.  Cranial nerve function normal bilaterally.  Motor examination 5/5 bilaterally with no pronator drift.  Sensory examination normal.  Reflexes symmetric.  No evidence of long track signs oropharynx, nasopharynx and external auditory canals clear.  Chest and abdomen benign.  Extremities free from injury deformity.  Examination of her head ears eyes nose and throat demonstrates evidence of a small left lateral canthus abrasion without any underlying bony abnormality.  Oropharynx nasopharynx and external auditory canals clear.  Chest and abdomen benign.  Extremities free from injury deformity. Assessment/Plan Left convexity subdural hematoma complicated by anticoagulation.  Plan ICU observation.  No current medical reversal agents for her anticoagulation available.  Plan follow-up head CT scan tomorrow.  Cooper Render Gowri Suchan 08/24/2017, 10:13 PM

## 2017-08-24 NOTE — ED Notes (Signed)
Pt took BP & atorvastatin. Dr. Ericka Pontiff, EDP gave ok.

## 2017-08-25 ENCOUNTER — Inpatient Hospital Stay (HOSPITAL_COMMUNITY): Payer: Medicare Other

## 2017-08-25 LAB — MRSA PCR SCREENING: MRSA BY PCR: NEGATIVE

## 2017-08-25 MED ORDER — PRAVASTATIN SODIUM 40 MG PO TABS
40.0000 mg | ORAL_TABLET | Freq: Every day | ORAL | Status: DC
  Administered 2017-08-25: 40 mg via ORAL
  Filled 2017-08-25: qty 1

## 2017-08-25 NOTE — Progress Notes (Signed)
Subjective: Patient resting comfortably in bed. Follow-up CT scan earlier today shows relative stability of the small left hemispheric subdural hematoma. This no significant mass effect, primarily due to significant generalized atrophy.  Objective: Vital signs in last 24 hours: Vitals:   08/25/17 0600 08/25/17 0700 08/25/17 0800 08/25/17 0900  BP: 123/70 (!) 101/53 (!) 136/109 133/81  Pulse: 93 96 88 99  Resp: 14 19 16 16   Temp: 98.3 F (36.8 C)     TempSrc:      SpO2: 95% 99% 96% 98%  Weight:      Height:        Intake/Output from previous day: 12/22 0701 - 12/23 0700 In: 12.6 [I.V.:12.6] Out: 200 [Urine:200] Intake/Output this shift: No intake/output data recorded.  Physical Exam:  Awake and alert, oriented to name, hospital, and 2018. Following commands. Moving all 4 extremities well. No drift of upper extremities.  CBC Recent Labs    08/24/17 1915  WBC 9.1  HGB 11.4*  HCT 38.1  PLT 254   BMET Recent Labs    08/24/17 1915  NA 135  K 3.6  CL 101  CO2 25  GLUCOSE 103*  BUN 6  CREATININE 0.54  CALCIUM 8.8*   ABG    Component Value Date/Time   TCO2 27 09/04/2015 1630    Studies/Results: Ct Head Wo Contrast  Result Date: 08/25/2017 CLINICAL DATA:  Subdural hematoma follow-up EXAM: CT HEAD WITHOUT CONTRAST TECHNIQUE: Contiguous axial images were obtained from the base of the skull through the vertex without intravenous contrast. COMPARISON:  Head CT 08/24/2017 FINDINGS: Brain: Left convexity subdural hematoma is unchanged in size, with mild redistribution of blood products. The widest point of the hematoma measures 13 mm. There is no new area of hemorrhage. No midline shift or other mass effect. No hydrocephalus. There is periventricular hypoattenuation compatible with chronic microvascular disease. Vascular: No hyperdense vessel or unexpected calcification. Skull: Normal. Negative for fracture or focal lesion. Sinuses/Orbits: No acute finding. Other: None.  IMPRESSION: Slight redistribution of blood in the left convexity subdural space, but overall unchanged size of left subdural hematoma. No midline shift or herniation. No new hemorrhage or hydrocephalus. Electronically Signed   By: Ulyses Jarred M.D.   On: 08/25/2017 01:18   Ct Head Wo Contrast  Result Date: 08/24/2017 CLINICAL DATA:  Subdural hematoma at outside hospital today after trip and fall this morning hitting head. EXAM: CT HEAD WITHOUT CONTRAST TECHNIQUE: Contiguous axial images were obtained from the base of the skull through the vertex without intravenous contrast. COMPARISON:  06/08/2016 FINDINGS: Brain: The ventricles and cisterns are within normal. There is minimal age related atrophic change. There is mild chronic ischemic microvascular disease. Prominent CSF space over the periphery of the right cerebellar hemisphere. Suggestion of an old high small left parietal infarct. There is a small acute left convexity subdural hematoma. This measures 1.4 cm in greatest thickness at the level of the left temporal lobe and measures 7 mm in thickness at the level of the frontal horns. There is very minimal associated local mass effect. No evidence of midline shift. Possible punctate focus of acute subarachnoid hemorrhage over the right posterior parietal region. Vascular: No hyperdense vessel or unexpected calcification. Skull: Normal. Negative for fracture or focal lesion. Sinuses/Orbits: No acute finding. Other: None. IMPRESSION: Small acute subdural hematoma over the left convexity measuring 14 mm in thickness at the level of the temporal lobe and 7 mm in thickness at the level of the frontal horns. Very minimal  local mass effect and no significant midline shift. Possible punctate focus of acute subarachnoid hemorrhage over the right posterior parietal region. Chronic ischemic microvascular disease and mild age related atrophic change. Small old high left parietal infarct. Critical Value/emergent results  were called by telephone at the time of interpretation on 08/24/2017 at 8:17 pm to Dr. Elnora Morrison , who verbally acknowledged these results. Electronically Signed   By: Marin Olp M.D.   On: 08/24/2017 20:20    Assessment/Plan: Case discussed with Dr. Annette Stable. He favors continuing the patient off of Eliquis until subdural hematoma resolves. He will have the patient follow up with him in the office. For now we'll begin to progress the patient. We will discontinued bedrest, and progress to sitting up out of bed and chair, to then progress to ambulation. We will begin a regular diet. We will resume her home medications other than her Eliquis.  I spoke with the patient, her husband, her son, and other family members. I explained to them the conflicting concerns regarding her atrial fibrillation with associated history of stroke and the need for anticoagulation, as well as the subdural hematoma and the risk of anticoagulation causing expanding hematoma and increased neurologic difficulties. For now we favor continuing off of Eliquis, with subsequent follow-up as an outpatient with Dr. Annette Stable.   Hosie Spangle, MD 08/25/2017, 10:33 AM

## 2017-08-25 NOTE — Progress Notes (Signed)
PT Cancellation Note  Patient Details Name: Kari [name] MRN: [id number] DOB: 07/01/1934   Cancelled Treatment:    Reason Eval/Treat Not Completed: Medical issues which prohibited therapy   Currently on bedrest; will need incr activity orders to proceed with PT eval;   Will follow up later today as time allows;  Otherwise, will follow up for PT tomorrow;   Thank you,  Roney Marion, PT  Acute Rehabilitation Services Pager [phone] Office [place] 08/25/2017, 7:45 AM

## 2017-08-25 NOTE — Plan of Care (Signed)
Pt started on regular diet. Decent appetite; tolerated well today.

## 2017-08-25 NOTE — Progress Notes (Signed)
OT Cancellation    08/25/17 0700  OT Visit Information  Last OT Received On 08/25/17  Reason Eval/Treat Not Completed Medical issues which prohibited therapy. Pt with active bedrest orders. Will await increase in activity orders prior to initiating OT evaluation. Thank you.    Dewane Timson MSOT, OTR/L Acute Rehab Pager: [phone] Office: [phone] 

## 2017-08-25 NOTE — Evaluation (Signed)
Physical Therapy Evaluation Patient Details Name: Kari [name] MRN: [id number] DOB: 1934/01/21 Today's Date: 08/25/2017   History of Present Illness  Ad[place] in [place]; Neurosurgery is opting to closely observe at this point; Patient is chronically on Eliquis for stroke prophylaxis secondary to history of atrial fibrillation. has a pertinent past medical history of Frequent falls,  AF (atrial fibrillation) ([place]), Hyperlipidemia, Hypertension, Kyphosis, Metabolic syndrome X, Obesity, Osteoporosis, Stroke [facility])  Clinical Impression   Pt admitted with above diagnosis. Pt currently with functional limitations due to the deficits listed below (see PT Problem List). Ms. Hinkson prides herself on getting out and about daily, and was active with Outpt PT for her balance and gait prior to this admission; She presents with unsteadiness with gait; Recommend she continues with OUtpt PT post discharge;  Pt will benefit from skilled PT to increase their independence and safety with mobility to allow discharge to the venue listed below.       Follow Up Recommendations Outpatient PT;Other (comment)(Prides hersof on not being homebound)    Equipment Recommendations  None recommended by PT    Recommendations for Other Services OT consult     Precautions / Restrictions Precautions Precautions: Fall      Mobility  Bed Mobility Overal bed mobility: Needs Assistance Bed Mobility: Supine to Sit     Supine to sit: Min guard     General bed mobility comments: Cues for technique; minguard for safety  Transfers Overall transfer level: Needs assistance Equipment used: Rolling walker (2 wheeled) Transfers: Sit to/from Stand Sit to Stand: Min assist         General transfer comment: Min assist to steady  Ambulation/Gait Ambulation/Gait assistance: Min guard Ambulation Distance (Feet): 100 Feet Assistive device: Rolling walker (2 wheeled);None Gait  Pattern/deviations: Wide base of support     General Gait Details: Cues to self-monitor for activity tolerance; no gross loss of balance noted  Stairs            Wheelchair Mobility    Modified Rankin (Stroke Patients Only)       Balance Overall balance assessment: Needs assistance           Standing balance-Leahy Scale: Fair                               Pertinent Vitals/Pain Pain Assessment: No/denies pain    Home Living Family/patient expects to be discharged to:: Private residence Living Arrangements: Spouse/significant other Available Help at Discharge: Family;Available 24 hours/day Type of Home: House Home Access: Stairs to enter   [employer] of Steps: 1 Home Layout: One level;Laundry or work area in basement        Prior Function Level of Independence: Independent         Comments: no longer drives, but husband drives and she tries to get out and bout the community daily     Hand Dominance        Extremity/Trunk Assessment   Upper Extremity Assessment Upper Extremity Assessment: Defer to OT evaluation    Lower Extremity Assessment Lower Extremity Assessment: Generalized weakness       Communication   Communication: No difficulties  Cognition Arousal/Alertness: Awake/alert Behavior During Therapy: WFL for tasks assessed/performed Overall Cognitive Status: Within Functional Limits for tasks assessed(for simple mobiltiy tasks) Area of Impairment: Awareness  General Comments: Decr awareness of fall risk      General Comments General comments (skin integrity, edema, etc.): HR ranged 110-144 during walk    Exercises     Assessment/Plan    PT Assessment Patient needs continued PT services  PT Problem List Decreased strength;Decreased activity tolerance;Decreased balance;Decreased mobility;Decreased coordination;Decreased cognition;Decreased knowledge of use of  DME;Decreased safety awareness;Decreased knowledge of precautions;Cardiopulmonary status limiting activity       PT Treatment Interventions DME instruction;Gait training;Stair training;Functional mobility training;Therapeutic activities;Therapeutic exercise;Balance training;Neuromuscular re-education;Cognitive remediation;Patient/family education    PT Goals (Current goals can be found in the Care Plan section)  Acute Rehab PT Goals Patient Stated Goal: home soon PT Goal Formulation: With patient Time For Goal Achievement: 09/08/17 Potential to Achieve Goals: Good    Frequency Min 3X/week   Barriers to discharge        Co-evaluation               AM-PAC PT "6 Clicks" Daily Activity  Outcome Measure Difficulty turning over in bed (including adjusting bedclothes, sheets and blankets)?: A Little Difficulty moving from lying on back to sitting on the side of the bed? : A Little Difficulty sitting down on and standing up from a chair with arms (e.g., wheelchair, bedside commode, etc,.)?: A Little Help needed moving to and from a bed to chair (including a wheelchair)?: A Little Help needed walking in hospital room?: A Little Help needed climbing 3-5 steps with a railing? : A Lot 6 Click Score: 17    End of Session Equipment Utilized During Treatment: Gait belt Activity Tolerance: Patient tolerated treatment well Patient left: in chair;with call bell/phone within reach Nurse Communication: Mobility status PT Visit Diagnosis: Unsteadiness on feet (R26.81);Other abnormalities of gait and mobility (R26.89);Repeated falls (R29.6)    Time: 6770-3403 PT Time Calculation (min) (ACUTE ONLY): 21 min   Charges:   PT Evaluation $PT Eval Moderate Complexity: 1 Mod     PT G Codes:        Roney Marion, PT  Acute Rehabilitation Services Pager [phone] Office [phone]   [place] 08/25/2017, 1:01 PM

## 2017-08-26 MED ORDER — LEVETIRACETAM 500 MG PO TABS: 500.0000 mg | ORAL_TABLET | Freq: Two times a day (BID) | ORAL | 0 refills | Status: DC

## 2017-08-26 MED ORDER — PANTOPRAZOLE SODIUM 40 MG PO TBEC: 40.0000 mg | DELAYED_RELEASE_TABLET | Freq: Every day | ORAL | Status: DC

## 2017-08-26 MED ORDER — TRAMADOL HCL 50 MG PO TABS: 50.0000 mg | ORAL_TABLET | Freq: Four times a day (QID) | ORAL | 0 refills | Status: DC | PRN

## 2017-08-26 NOTE — Discharge Summary (Signed)
Physician Discharge Summary  Patient ID: Kari [name] MRN: [id number] DOB/AGE: 11-23-1933 83 y.o.  Admit date: 08/24/2017 Discharge date: 08/26/2017  Admission Diagnoses:  SDH  Discharge Diagnoses:  Same Active Problems:   SDH (subdural hematoma) [facility])   Discharged Condition: Stable  Hospital Course:  Kari [name] is a 83 y.o. female who presented to ER on 12/22 after fall. CT showed SDH. She is on Eliquis for CVA prevention. She was neuro intact and admitted for obs. Repeat head CT was stable. She remained neuro intact. At time of discharge, pain was well controlled, ambulating, tolerating po, voiding normal. Ready for discharge. She will follow up outpt with Dr Earnie Larsson. Complete Keppra for seizure prophylaxis. Hold Eliquis until cleared by Dr Annette Stable.  Treatments: Surgery - none  Discharge Exam: Blood pressure (!) 161/89, pulse (!) 123, temperature 98.5 F (36.9 C), temperature source Oral, resp. rate (!) 24, height 5\' 1"  (1.549 m), weight 67 kg (147 lb 11.3 oz), SpO2 97 %. Awake, alert, oriented Speech fluent, appropriate CN grossly intact 5/5 BUE/BLE Wound c/d/i  Disposition: 01-Home or Self Care  Discharge Instructions    Call MD for:  difficulty breathing, headache or visual disturbances   Complete by:  As directed    Call MD for:  persistant dizziness or light-headedness   Complete by:  As directed    Call MD for:  redness, tenderness, or signs of infection (pain, swelling, redness, odor or green/yellow discharge around incision site)   Complete by:  As directed    Call MD for:  severe uncontrolled pain   Complete by:  As directed    Call MD for:  temperature >100.4   Complete by:  As directed    Diet general   Complete by:  As directed    Driving Restrictions   Complete by:  As directed    Do not drive until given clearance.   Increase activity slowly   Complete by:  As directed    Lifting restrictions   Complete by:  As directed    Do not lift  anything >10lbs. Avoid bending and twisting in awkward positions. Avoid bending at the back.   Remove dressing in 24 hours   Complete by:  As directed      Allergies as of 08/26/2017      Reactions   Erythromycin Swelling, Other (See Comments)   Reaction unknown   Penicillins Other (See Comments)   Reaction unknown  Has patient had a PCN reaction causing immediate rash, facial/tongue/throat swelling, SOB or lightheadedness with hypotension: unknown Has patient had a PCN reaction causing severe rash involving mucus membranes or skin necrosis: unknown Has patient had a PCN reaction that required hospitalization : unknown Has patient had a PCN reaction occurring within the last 10 years: no If all of the above answers are "NO", then may proceed with Cephalosporin use.      Medication List    STOP taking these medications   ELIQUIS 5 MG Tabs tablet Generic drug:  apixaban     TAKE these medications   atenolol 50 MG tablet Commonly known as:  TENORMIN Take 50 mg by mouth daily.   captopril 50 MG tablet Commonly known as:  CAPOTEN TAKE (1) TABLET TWICE A DAY FOR HIGH BLOOD PRESSURE.   ezetimibe 10 MG tablet Commonly known as:  ZETIA TAKE 1 TABLET ONCE DAILY FOR CHOLESTEROL   HYDROcodone-homatropine 5-1.5 MG/5ML syrup Commonly known as:  HYCODAN Take 5 mLs by mouth every 6 (  six) hours as needed for cough.   levETIRAcetam 500 MG tablet Commonly known as:  KEPPRA Take 1 tablet (500 mg total) by mouth 2 (two) times daily.   potassium chloride 10 MEQ tablet Commonly known as:  K-DUR TAKE 1 TABLET DAILY   pravastatin 80 MG tablet Commonly known as:  PRAVACHOL TAKE 1 TABLET ONCE DAILY FOR CHOLESTEROL What changed:  See the new instructions.   traMADol 50 MG tablet Commonly known as:  ULTRAM Take 1-2 tablets (50-100 mg total) by mouth every 6 (six) hours as needed for moderate pain.   Vitamin D (Ergocalciferol) 50000 units Caps capsule Commonly known as:  DRISDOL TAKE 1  CAPSULE ONCE A WEEK      Follow-up Information    Earnie Larsson, MD Follow up.   Specialty:  Neurosurgery Contact information: [address] [place] [address] [place] [address] [phone]           Signed: Traci Sermon 08/26/2017, 10:32 AM

## 2017-08-26 NOTE — Progress Notes (Signed)
Physical Therapy Treatment Patient Details Name: Kari [name] MRN: [id number] DOB: [birth year] Today's Date: 08/26/2017    History of Present Illness Ad[place] post office [place] in [place]; Neurosurgery is opting to closely observe at this point; Patient is chronically on Eliquis for stroke prophylaxis secondary to history of atrial fibrillation. has a pertinent past medical history of Frequent falls,  AF (atrial fibrillation) ([place]), Hyperlipidemia, Hypertension, Kyphosis, Metabolic syndrome X, Obesity, Osteoporosis, Stroke ([place])    PT Comments    Pt moving well and very pleasant. Pt with continued balance and cognitive deficits with decreased STM, problem solving and awareness. Family confirms she has 24hr supervision and does not drive. Recommend quad cane at all times and pt able to verbalize need for AD. Will continue to follow.     Follow Up Recommendations  Outpatient PT     Equipment Recommendations  None recommended by PT    Recommendations for Other Services       Precautions / Restrictions Precautions Precautions: Fall Restrictions Weight Bearing Restrictions: No    Mobility  Bed Mobility Overal bed mobility: Needs Assistance Bed Mobility: Supine to Sit     Supine to sit: Supervision     General bed mobility comments: in chair on arrival  Transfers Overall transfer level: Needs assistance   Transfers: Sit to/from Stand Sit to Stand: Min guard         General transfer comment: guarding for safety due to balance deficits  Ambulation/Gait Ambulation/Gait assistance: Min assist Ambulation Distance (Feet): 300 Feet Assistive device: None Gait Pattern/deviations: Step-through pattern;Decreased stride length   Gait velocity interpretation: Below normal speed for age/gender General Gait Details: pt with LOB with closing eyes walking into sunlight, LOB on initial standing reaching out of environmental support. Educated for deficits and need  for AD at all times with gait due to balance deficits   Stairs Stairs: Yes   Stair Management: One rail Left;Step to pattern;Forwards Number of Stairs: 4    Wheelchair Mobility    Modified Rankin (Stroke Patients Only) Modified Rankin (Stroke Patients Only) Pre-Morbid Rankin Score: No significant disability Modified Rankin: Moderate disability     Balance Overall balance assessment: Needs assistance   Sitting balance-Leahy Scale: Good       Standing balance-Leahy Scale: Fair   Single Leg Stance - Right Leg: 0 Single Leg Stance - Left Leg: 0     [place] - Eyes Opened: 15 [place] - Eyes Closed: 1   High Level Balance Comments: pt with LOB with eyes closed, able to lift leg but requires support to maintain balance            Cognition Arousal/Alertness: Awake/alert Behavior During Therapy: WFL for tasks assessed/performed Overall Cognitive Status: Impaired/Different from baseline Area of Impairment: Following commands;Awareness;Safety/judgement;Problem solving;Memory                     Memory: Decreased short-term memory Following Commands: Follows multi-step commands inconsistently Safety/Judgement: Decreased awareness of safety;Decreased awareness of deficits Awareness: Emergent Problem Solving: Slow processing;Difficulty sequencing General Comments: decreased awareness of balance deficits, unable to recall room number within room and required cues x 6 in hallway to return to room, unable to state how to call 911      Exercises      General Comments General comments (skin integrity, edema, etc.): HR up to 151 with gait but nonsustained and generally 130 with gait      Pertinent Vitals/Pain Pain Assessment: No/denies pain  Home Living Family/patient expects to be discharged to:: Private residence Living Arrangements: Spouse/significant other Available Help at Discharge: Family;Available 24 hours/day Type of Home: House Home Access:  Stairs to enter   Home Layout: One level;Laundry or work area in [place]: [place]andra [place] - single point(refuses to use her cane) Additional Comments: family reports she is never alone and that she refuses to use a cane. pt reports that if "Dr Laurance Flatten family MD reports for her to do it then she will do it and swim the ocean for him but not [place]"    Prior Function Level of Independence: Independent      Comments: no longer drives, but husband drives and she tries to get out and bout the community daily   PT Goals (current goals can now be found in the care plan section) Acute Rehab PT Goals Patient Stated Goal: to be home for christmas Progress towards PT goals: Progressing toward goals    Frequency    Min 3X/week      PT Plan Current plan remains appropriate    Co-evaluation              AM-PAC PT "6 Clicks" Daily Activity  Outcome Measure  Difficulty turning over in bed (including adjusting bedclothes, sheets and blankets)?: A Little Difficulty moving from lying on back to sitting on the side of the bed? : A Little Difficulty sitting down on and standing up from a chair with arms (e.g., wheelchair, bedside commode, etc,.)?: A Little Help needed moving to and from a bed to chair (including a wheelchair)?: A Little Help needed walking in hospital room?: A Little Help needed climbing 3-5 steps with a railing? : A Little 6 Click Score: 18    End of Session Equipment Utilized During Treatment: Gait belt Activity Tolerance: Patient tolerated treatment well Patient left: in chair;with call bell/phone within reach;with family/visitor present Nurse Communication: Mobility status PT Visit Diagnosis: Unsteadiness on feet (R26.81);Other abnormalities of gait and mobility (R26.89);Repeated falls (R29.6)     Time: 6256-3893 PT Time Calculation (min) (ACUTE ONLY): 25 min  Charges:  $Gait Training: 8-22 mins $Neuromuscular Re-education: 8-22 mins                    G  Codes:       Elwyn Reach, PT [phone]    [place] 08/26/2017, 9:59 AM
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
## 2017-08-26 NOTE — Progress Notes (Signed)
Pt ready for dc home accompanied by family. Pt and Son report that they understand all d/c medications, follow up appointments, and instructions. Family refusing speech therapy, MD aware and ok to go home. Accompanied to car, patient in no distress and/or pain.   Pres[place]illa Sours, Therapist, sports

## 2017-08-28 DIAGNOSIS — Z8673 Personal history of transient ischemic attack (TIA), and cerebral infarction without residual deficits: Secondary | ICD-10-CM | POA: Diagnosis not present

## 2017-08-28 DIAGNOSIS — R2681 Unsteadiness on feet: Secondary | ICD-10-CM | POA: Diagnosis not present

## 2017-08-28 DIAGNOSIS — R296 Repeated falls: Secondary | ICD-10-CM | POA: Diagnosis not present

## 2017-08-28 DIAGNOSIS — M6281 Muscle weakness (generalized): Secondary | ICD-10-CM | POA: Diagnosis not present

## 2017-08-30 DIAGNOSIS — Z7901 Long term (current) use of anticoagulants: Secondary | ICD-10-CM | POA: Diagnosis not present

## 2017-08-30 DIAGNOSIS — S80212A Abrasion, left knee, initial encounter: Secondary | ICD-10-CM | POA: Diagnosis not present

## 2017-08-30 DIAGNOSIS — Y9289 Other specified places as the place of occurrence of the external cause: Secondary | ICD-10-CM | POA: Diagnosis not present

## 2017-08-30 DIAGNOSIS — R262 Difficulty in walking, not elsewhere classified: Secondary | ICD-10-CM | POA: Diagnosis not present

## 2017-08-30 DIAGNOSIS — I10 Essential (primary) hypertension: Secondary | ICD-10-CM | POA: Diagnosis not present

## 2017-08-30 DIAGNOSIS — M25572 Pain in left ankle and joints of left foot: Secondary | ICD-10-CM | POA: Diagnosis not present

## 2017-08-30 DIAGNOSIS — Z8673 Personal history of transient ischemic attack (TIA), and cerebral infarction without residual deficits: Secondary | ICD-10-CM | POA: Diagnosis not present

## 2017-08-30 DIAGNOSIS — M25562 Pain in left knee: Secondary | ICD-10-CM | POA: Diagnosis not present

## 2017-08-30 DIAGNOSIS — E785 Hyperlipidemia, unspecified: Secondary | ICD-10-CM | POA: Diagnosis not present

## 2017-08-30 DIAGNOSIS — Y939 Activity, unspecified: Secondary | ICD-10-CM | POA: Diagnosis not present

## 2017-08-30 DIAGNOSIS — M85862 Other specified disorders of bone density and structure, left lower leg: Secondary | ICD-10-CM | POA: Diagnosis not present

## 2017-08-30 DIAGNOSIS — M1712 Unilateral primary osteoarthritis, left knee: Secondary | ICD-10-CM | POA: Diagnosis not present

## 2017-08-30 DIAGNOSIS — M79662 Pain in left lower leg: Secondary | ICD-10-CM | POA: Diagnosis not present

## 2017-08-30 DIAGNOSIS — M7732 Calcaneal spur, left foot: Secondary | ICD-10-CM | POA: Diagnosis not present

## 2017-08-30 DIAGNOSIS — I4891 Unspecified atrial fibrillation: Secondary | ICD-10-CM | POA: Diagnosis not present

## 2017-08-30 DIAGNOSIS — S8002XA Contusion of left knee, initial encounter: Secondary | ICD-10-CM | POA: Diagnosis not present

## 2017-08-30 DIAGNOSIS — M19072 Primary osteoarthritis, left ankle and foot: Secondary | ICD-10-CM | POA: Diagnosis not present

## 2017-08-30 DIAGNOSIS — W19XXXA Unspecified fall, initial encounter: Secondary | ICD-10-CM | POA: Diagnosis not present

## 2017-09-02 DIAGNOSIS — Z8673 Personal history of transient ischemic attack (TIA), and cerebral infarction without residual deficits: Secondary | ICD-10-CM | POA: Diagnosis not present

## 2017-09-02 DIAGNOSIS — R2681 Unsteadiness on feet: Secondary | ICD-10-CM | POA: Diagnosis not present

## 2017-09-02 DIAGNOSIS — R296 Repeated falls: Secondary | ICD-10-CM | POA: Diagnosis not present

## 2017-09-02 DIAGNOSIS — M6281 Muscle weakness (generalized): Secondary | ICD-10-CM | POA: Diagnosis not present

## 2017-09-05 DIAGNOSIS — R296 Repeated falls: Secondary | ICD-10-CM | POA: Diagnosis not present

## 2017-09-05 DIAGNOSIS — Z8673 Personal history of transient ischemic attack (TIA), and cerebral infarction without residual deficits: Secondary | ICD-10-CM | POA: Diagnosis not present

## 2017-09-05 DIAGNOSIS — M6281 Muscle weakness (generalized): Secondary | ICD-10-CM | POA: Diagnosis not present

## 2017-09-05 DIAGNOSIS — R2681 Unsteadiness on feet: Secondary | ICD-10-CM | POA: Diagnosis not present

## 2017-09-09 DIAGNOSIS — M6281 Muscle weakness (generalized): Secondary | ICD-10-CM | POA: Diagnosis not present

## 2017-09-09 DIAGNOSIS — Z8673 Personal history of transient ischemic attack (TIA), and cerebral infarction without residual deficits: Secondary | ICD-10-CM | POA: Diagnosis not present

## 2017-09-09 DIAGNOSIS — R2681 Unsteadiness on feet: Secondary | ICD-10-CM | POA: Diagnosis not present

## 2017-09-09 DIAGNOSIS — R296 Repeated falls: Secondary | ICD-10-CM | POA: Diagnosis not present

## 2017-09-10 DIAGNOSIS — S065X9A Traumatic subdural hemorrhage with loss of consciousness of unspecified duration, initial encounter: Secondary | ICD-10-CM | POA: Diagnosis not present

## 2017-09-11 DIAGNOSIS — R296 Repeated falls: Secondary | ICD-10-CM | POA: Diagnosis not present

## 2017-09-11 DIAGNOSIS — Z8673 Personal history of transient ischemic attack (TIA), and cerebral infarction without residual deficits: Secondary | ICD-10-CM | POA: Diagnosis not present

## 2017-09-11 DIAGNOSIS — M6281 Muscle weakness (generalized): Secondary | ICD-10-CM | POA: Diagnosis not present

## 2017-09-11 DIAGNOSIS — R2681 Unsteadiness on feet: Secondary | ICD-10-CM | POA: Diagnosis not present

## 2017-09-12 ENCOUNTER — Other Ambulatory Visit: Payer: Self-pay | Admitting: Neurosurgery

## 2017-09-12 DIAGNOSIS — S065X9A Traumatic subdural hemorrhage with loss of consciousness of unspecified duration, initial encounter: Secondary | ICD-10-CM

## 2017-09-12 DIAGNOSIS — S065XAA Traumatic subdural hemorrhage with loss of consciousness status unknown, initial encounter: Secondary | ICD-10-CM

## 2017-09-17 DIAGNOSIS — R2681 Unsteadiness on feet: Secondary | ICD-10-CM | POA: Diagnosis not present

## 2017-09-17 DIAGNOSIS — Z8673 Personal history of transient ischemic attack (TIA), and cerebral infarction without residual deficits: Secondary | ICD-10-CM | POA: Diagnosis not present

## 2017-09-17 DIAGNOSIS — R296 Repeated falls: Secondary | ICD-10-CM | POA: Diagnosis not present

## 2017-09-17 DIAGNOSIS — M6281 Muscle weakness (generalized): Secondary | ICD-10-CM | POA: Diagnosis not present

## 2017-09-19 DIAGNOSIS — R296 Repeated falls: Secondary | ICD-10-CM | POA: Diagnosis not present

## 2017-09-19 DIAGNOSIS — R2681 Unsteadiness on feet: Secondary | ICD-10-CM | POA: Diagnosis not present

## 2017-09-19 DIAGNOSIS — Z8673 Personal history of transient ischemic attack (TIA), and cerebral infarction without residual deficits: Secondary | ICD-10-CM | POA: Diagnosis not present

## 2017-09-19 DIAGNOSIS — M6281 Muscle weakness (generalized): Secondary | ICD-10-CM | POA: Diagnosis not present

## 2017-09-23 DIAGNOSIS — Z8673 Personal history of transient ischemic attack (TIA), and cerebral infarction without residual deficits: Secondary | ICD-10-CM | POA: Diagnosis not present

## 2017-09-23 DIAGNOSIS — M6281 Muscle weakness (generalized): Secondary | ICD-10-CM | POA: Diagnosis not present

## 2017-09-23 DIAGNOSIS — R296 Repeated falls: Secondary | ICD-10-CM | POA: Diagnosis not present

## 2017-09-23 DIAGNOSIS — R2681 Unsteadiness on feet: Secondary | ICD-10-CM | POA: Diagnosis not present

## 2017-09-24 ENCOUNTER — Ambulatory Visit
Admission: RE | Admit: 2017-09-24 | Discharge: 2017-09-24 | Disposition: A | Discharge status: Home / Self Care | Payer: Medicare Other | Source: Ambulatory Visit | Attending: Neurosurgery | Admitting: Neurosurgery

## 2017-09-24 DIAGNOSIS — I6200 Nontraumatic subdural hemorrhage, unspecified: Secondary | ICD-10-CM | POA: Diagnosis not present

## 2017-09-24 DIAGNOSIS — S065XAA Traumatic subdural hemorrhage with loss of consciousness status unknown, initial encounter: Secondary | ICD-10-CM

## 2017-09-24 DIAGNOSIS — S065X9A Traumatic subdural hemorrhage with loss of consciousness of unspecified duration, initial encounter: Secondary | ICD-10-CM | POA: Diagnosis not present

## 2017-09-24 DIAGNOSIS — I10 Essential (primary) hypertension: Secondary | ICD-10-CM | POA: Diagnosis not present

## 2017-09-24 DIAGNOSIS — Z6827 Body mass index (BMI) 27.0-27.9, adult: Secondary | ICD-10-CM | POA: Diagnosis not present

## 2017-09-25 ENCOUNTER — Encounter: Payer: Self-pay | Admitting: Family Medicine

## 2017-09-25 ENCOUNTER — Ambulatory Visit (INDEPENDENT_AMBULATORY_CARE_PROVIDER_SITE_OTHER): Payer: Medicare Other | Admitting: Family Medicine

## 2017-09-25 VITALS — BP 145/86 | HR 98 | Temp 98.2°F | Ht 61.0 in | Wt 150.0 lb

## 2017-09-25 DIAGNOSIS — I700 Atherosclerosis of aorta: Secondary | ICD-10-CM

## 2017-09-25 DIAGNOSIS — N390 Urinary tract infection, site not specified: Secondary | ICD-10-CM

## 2017-09-25 DIAGNOSIS — I6930 Unspecified sequelae of cerebral infarction: Secondary | ICD-10-CM

## 2017-09-25 DIAGNOSIS — R296 Repeated falls: Secondary | ICD-10-CM | POA: Diagnosis not present

## 2017-09-25 DIAGNOSIS — I639 Cerebral infarction, unspecified: Secondary | ICD-10-CM

## 2017-09-25 DIAGNOSIS — I481 Persistent atrial fibrillation: Secondary | ICD-10-CM

## 2017-09-25 DIAGNOSIS — M6281 Muscle weakness (generalized): Secondary | ICD-10-CM | POA: Diagnosis not present

## 2017-09-25 DIAGNOSIS — Z8673 Personal history of transient ischemic attack (TIA), and cerebral infarction without residual deficits: Secondary | ICD-10-CM | POA: Diagnosis not present

## 2017-09-25 DIAGNOSIS — R2681 Unsteadiness on feet: Secondary | ICD-10-CM | POA: Diagnosis not present

## 2017-09-25 DIAGNOSIS — I10 Essential (primary) hypertension: Secondary | ICD-10-CM | POA: Diagnosis not present

## 2017-09-25 DIAGNOSIS — S065XAA Traumatic subdural hemorrhage with loss of consciousness status unknown, initial encounter: Secondary | ICD-10-CM

## 2017-09-25 DIAGNOSIS — S065X9A Traumatic subdural hemorrhage with loss of consciousness of unspecified duration, initial encounter: Secondary | ICD-10-CM | POA: Diagnosis not present

## 2017-09-25 DIAGNOSIS — I4819 Other persistent atrial fibrillation: Secondary | ICD-10-CM

## 2017-09-25 LAB — MICROSCOPIC EXAMINATION
RBC, UA: NONE SEEN /hpf (ref 0–?)
RENAL EPITHEL UA: NONE SEEN /HPF

## 2017-09-25 LAB — URINALYSIS, COMPLETE
BILIRUBIN UA: NEGATIVE
GLUCOSE, UA: NEGATIVE
Ketones, UA: NEGATIVE
Nitrite, UA: NEGATIVE
PH UA: 6.5 (ref 5.0–7.5)
PROTEIN UA: NEGATIVE
RBC UA: NEGATIVE
Specific Gravity, UA: 1.01 (ref 1.005–1.030)
Urobilinogen, Ur: 0.2 mg/dL (ref 0.2–1.0)

## 2017-09-25 NOTE — Addendum Note (Signed)
Addended by: Zannie Cove on: 09/25/2017 04:33 PM   Modules accepted: Orders

## 2017-09-25 NOTE — Patient Instructions (Signed)
Continue with physical therapy Follow-up with appointment with neurologist as planned We will try to get an earlier appointment with cardiology to assess the need for restarting her Eliquis. Use the cane regularly Drink plenty of water and stay well-hydrated

## 2017-09-25 NOTE — Progress Notes (Signed)
Subjective:    Patient ID: Kari [name], female    DOB: [birth year], 84 y.o.   MRN: [id number]  HPI  Patient here today for hospital follow up from [facility], where she was admitted on 08/24/17 to 08/26/17. She had a fall, and was diagnosed with a subdural hematoma.  The patient had a fall on December 22 and had a subdural hematoma.  She saw the neurosurgeon yesterday and everything is good as far as the follow-up from the subdural hematoma.  The patient did have a stroke in November 2018 and was started on Eliquis.  Of course this was stopped when she had the subdural hematoma.  The decision to go back on the Eliquis was left up to primary care and cardiology.  The family meeting the son and daughter are think would prefer her to go back on it because of the risk of having a stroke being so great.  The patient does not care one way or the other.  There is no chest pain or shortness of breath anymore than usual.  She is still getting physical therapy to help with some weakness in her left hip.  She denies any problems with intestinal issues and she has had a history of urinary tract infections but not having any symptoms right now.  The daughter feels like she needs to drink more fluids and more water and we concurred with this and reemphasize this to the patient.  We will get a urinalysis today before she leaves the office.  She does have an upcoming appointment with a neurologist to follow-up from the stroke.  We will try to get a sooner appointment with a cardiologist to see if he agrees with reinitiating the Eliquis.     Patient Active Problem List   Diagnosis Date Noted  . SDH (subdural hematoma) ([place]) 08/24/2017  . CVA (cerebral vascular accident) ([place]) 07/29/2017  . Influenza A 12/06/2016  . Sciatica of right side 04/23/2016  . At high risk for falls 04/23/2016  . Increased BMI 09/30/2015  . Hyperlipidemia 09/07/2015  . Low back pain 09/07/2015  . Hypoxia 09/06/2015  . Orthostatic syncope  09/04/2015  . Orthostatic hypotension 09/04/2015  . Aortic atherosclerosis ([place]) 05/03/2015  . Vitamin D deficiency 02/02/2015  . Pre-diabetes 02/02/2015  . Metabolic syndrome 37/62/8315  . Osteoporosis 03/11/2013  . Atrial fibrillation ([place]) 11/24/2012  . Hypokalemia 05/04/2012  . Obesity   . Symptomatic menopausal or female climacteric states   . Kyphosis   . Gallstones   . Personal history of adenomatous colonic polyps   . Essential hypertension 03/29/2009   Outpatient Encounter Medications as of 09/25/2017  Medication Sig  . atenolol (TENORMIN) 50 MG tablet Take 50 mg by mouth daily.  . captopril (CAPOTEN) 50 MG tablet TAKE (1) TABLET TWICE A DAY FOR HIGH BLOOD PRESSURE.  Marland Kitchen ezetimibe (ZETIA) 10 MG tablet TAKE 1 TABLET ONCE DAILY FOR CHOLESTEROL  . levETIRAcetam (KEPPRA) 500 MG tablet Take 1 tablet (500 mg total) by mouth 2 (two) times daily.  . potassium chloride (K-DUR) 10 MEQ tablet TAKE 1 TABLET DAILY  . pravastatin (PRAVACHOL) 80 MG tablet TAKE 1 TABLET ONCE DAILY FOR CHOLESTEROL (Patient taking differently: TAKE 0.5 TABLET ONCE DAILY FOR CHOLESTEROL)  . Vitamin D, Ergocalciferol, (DRISDOL) 50000 units CAPS capsule TAKE 1 CAPSULE ONCE A WEEK  . traMADol (ULTRAM) 50 MG tablet Take 1-2 tablets (50-100 mg total) by mouth every 6 (six) hours as needed for moderate pain. (Patient not taking: Reported on  09/25/2017)  . [DISCONTINUED] HYDROcodone-homatropine (HYCODAN) 5-1.5 MG/5ML syrup Take 5 mLs by mouth every 6 (six) hours as needed for cough.   No facility-administered encounter medications on file as of 09/25/2017.      Review of Systems  Constitutional: Negative.   HENT: Negative.   Eyes: Negative.   Respiratory: Negative.   Cardiovascular: Negative.   Gastrointestinal: Negative.   Endocrine: Negative.   Genitourinary: Negative.   Musculoskeletal: Negative.        Going to PT 2x week   Skin: Negative.   Allergic/Immunologic: Negative.   Neurological: Negative.     Hematological: Negative.   Psychiatric/Behavioral: Negative.        Objective:   Physical Exam  Constitutional: She is oriented to person, place, and time. She appears well-developed and well-nourished. No distress.  The patient is pleasant and alert and comes to the visit today with her daughter.  HENT:  Head: Normocephalic and atraumatic.  Right Ear: External ear normal.  Left Ear: External ear normal.  Nose: Nose normal.  Mouth/Throat: Oropharynx is clear and moist.  Eyes: Conjunctivae and EOM are normal. Pupils are equal, round, and reactive to light. Right eye exhibits no discharge. Left eye exhibits no discharge. No scleral icterus.  Neck: Normal range of motion. Neck supple. No thyromegaly present.  Cardiovascular: Normal rate, regular rhythm and normal heart sounds.  No murmur heard. 96/min and irregular irregular decreased pedal pulses  Pulmonary/Chest: Effort normal and breath sounds normal. No respiratory distress. She has no wheezes. She has no rales.  Clear anteriorly and posteriorly  Abdominal: Soft. Bowel sounds are normal. She exhibits no mass. There is no tenderness. There is no rebound and no guarding.  No abdominal tenderness masses or organ enlargement or suprapubic tenderness.  Musculoskeletal: Normal range of motion. She exhibits no edema.  Lymphadenopathy:    She has no cervical adenopathy.  Neurological: She is alert and oriented to person, place, and time. She has normal reflexes. No cranial nerve deficit.  Skin: Skin is warm and dry. No rash noted.  Psychiatric: She has a normal mood and affect. Her behavior is normal. Judgment and thought content normal.  Nursing note and vitals reviewed.  BP (!) 145/86 (BP Location: Right Arm)   Pulse 98   Temp 98.2 F (36.8 C) (Oral)   Ht 5\' 1"  (1.549 m)   Wt 150 lb (68 kg)   BMI 28.34 kg/m         Assessment & Plan:  1. Cerebrovascular accident (CVA), unspecified mechanism ([place]) -Patient was placed on  Eliquis after this occurred and has a follow-up appointment with neurology sometime in early February.  2. Essential hypertension -Continue current blood pressure medication  3. Persistent atrial fibrillation ([place]) -Follow-up with cardiology and see what his thoughts are regarding reinitiating the Eliquis  4. Subdural hematoma ([facility]) -No more follow-ups are planned with neurosurgery  5.  History of CVA -Follow-up with neurology as planned in early February -Gait instability following CVA and patient should continue with physical therapy  6.  Recurrent UTI -Recheck urinalysis today  Patient Instructions  Continue with physical therapy Follow-up with appointment with neurologist as planned We will try to get an earlier appointment with cardiology to assess the need for restarting her Eliquis. Use the cane regularly Drink plenty of water and stay well-hydrated  Arrie Senate MD

## 2017-09-27 ENCOUNTER — Telehealth: Payer: Self-pay | Admitting: Family Medicine

## 2017-09-27 LAB — URINE CULTURE

## 2017-09-28 ENCOUNTER — Encounter: Payer: Self-pay | Admitting: Family

## 2017-09-28 ENCOUNTER — Ambulatory Visit (INDEPENDENT_AMBULATORY_CARE_PROVIDER_SITE_OTHER): Payer: Medicare Other | Admitting: Family

## 2017-09-28 VITALS — BP 151/70 | HR 85 | Temp 97.2°F | Ht 61.0 in | Wt 147.0 lb

## 2017-09-28 DIAGNOSIS — I6930 Unspecified sequelae of cerebral infarction: Secondary | ICD-10-CM

## 2017-09-28 DIAGNOSIS — N390 Urinary tract infection, site not specified: Secondary | ICD-10-CM

## 2017-09-28 DIAGNOSIS — N3001 Acute cystitis with hematuria: Secondary | ICD-10-CM | POA: Diagnosis not present

## 2017-09-28 DIAGNOSIS — R300 Dysuria: Secondary | ICD-10-CM

## 2017-09-28 MED ORDER — CIPROFLOXACIN HCL 500 MG PO TABS: 500.0000 mg | ORAL_TABLET | Freq: Two times a day (BID) | ORAL | 0 refills | Status: DC

## 2017-09-28 NOTE — Patient Instructions (Signed)

## 2017-09-28 NOTE — Addendum Note (Signed)
Addended by: Evelina Dun A on: 09/28/2017 10:19 AM   Modules accepted: Orders

## 2017-09-28 NOTE — Progress Notes (Addendum)
   Subjective:    Patient ID: Kari [name], female    DOB: 10-May-1934, 84 y.o.   MRN: [id number]  Urinary Frequency   This is a new problem. The current episode started in the past 7 days. The problem has been waxing and waning. The pain is at a severity of 0/10. The patient is experiencing no pain. Associated symptoms include frequency and urgency. Pertinent negatives include no hematuria, hesitancy, nausea or vomiting. She has tried acetaminophen and increased fluids for the symptoms. The treatment provided mild relief.      Review of Systems  Gastrointestinal: Negative for nausea and vomiting.  Genitourinary: Positive for frequency and urgency. Negative for hematuria and hesitancy.  All other systems reviewed and are negative.      Objective:   Physical Exam  Constitutional: She is oriented to person, place, and time. She appears well-developed and well-nourished. No distress.  HENT:  Head: Normocephalic and atraumatic.  Right Ear: External ear normal.  Left Ear: External ear normal.  Eyes: Pupils are equal, round, and reactive to light.  Neck: Normal range of motion. Neck supple. No thyromegaly present.  Cardiovascular: Normal rate, regular rhythm, normal heart sounds and intact distal pulses.  No murmur heard. Pulmonary/Chest: Effort normal and breath sounds normal. No respiratory distress. She has no wheezes.  Abdominal: Soft. Bowel sounds are normal. She exhibits no distension. There is no tenderness.  Musculoskeletal: Normal range of motion. She exhibits no edema or tenderness.  Neurological: She is alert and oriented to person, place, and time.  Skin: Skin is warm and dry.  Psychiatric: She has a normal mood and affect. Judgment and thought content normal. She is withdrawn.  Vitals reviewed.     BP (!) 151/70   Pulse 85   Temp (!) 97.2 F (36.2 C) (Oral)   Ht 5\' 1"  (1.549 m)   Wt 147 lb (66.7 kg)   BMI 27.78 kg/m      Assessment & Plan:  1. Dysuria -  Urinalysis, Complete - Urine Culture  2. Acute cystitis with hematuria Force fluids AZO over the counter X2 days RTO prn Culture pending - ciprofloxacin (CIPRO) 500 MG tablet; Take 1 tablet (500 mg total) by mouth 2 (two) times daily.  Dispense: 14 tablet; Refill: 0   3. Recurrent UTI - Ambulatory referral to Urology  Evelina Dun, FNP

## 2017-09-29 LAB — URINE CULTURE

## 2017-09-30 DIAGNOSIS — R2681 Unsteadiness on feet: Secondary | ICD-10-CM | POA: Diagnosis not present

## 2017-09-30 DIAGNOSIS — Z8673 Personal history of transient ischemic attack (TIA), and cerebral infarction without residual deficits: Secondary | ICD-10-CM | POA: Diagnosis not present

## 2017-09-30 DIAGNOSIS — M6281 Muscle weakness (generalized): Secondary | ICD-10-CM | POA: Diagnosis not present

## 2017-09-30 DIAGNOSIS — R296 Repeated falls: Secondary | ICD-10-CM | POA: Diagnosis not present

## 2017-09-30 LAB — URINALYSIS, COMPLETE
BILIRUBIN UA: NEGATIVE
Glucose, UA: NEGATIVE
Ketones, UA: NEGATIVE
Nitrite, UA: NEGATIVE
PROTEIN UA: NEGATIVE
Specific Gravity, UA: 1.015 (ref 1.005–1.030)
Urobilinogen, Ur: 0.2 mg/dL (ref 0.2–1.0)
pH, UA: 7 (ref 5.0–7.5)

## 2017-09-30 LAB — MICROSCOPIC EXAMINATION

## 2017-09-30 NOTE — Telephone Encounter (Signed)
Please [phone] for results after 4:00pm pt has a physical therapy appt.

## 2017-09-30 NOTE — Telephone Encounter (Signed)
Daughter aware of results.  Gave information earlier today.

## 2017-10-01 NOTE — Progress Notes (Signed)
HPI The patient presents for followup of atrial fibrillation. Since last saw her she had a CVA.  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  She was switched to Eliquis.  She was seen once in our office in follow up after that.  She was in the hospital in Dec after a fall and subdural hematoma.  She is off the Eliquis.    Her family says that she is doing relatively well.  Her memory is down a little bit.  She is not having any falls and agreed very careful when she is using.  She has had some cold feet and may be some discomfort in her feet as well.  She seems to have recovered relatively well from the stroke.  She is not having any palpitations, presyncope or syncope.  She has a little bit of dizziness occasionally.  She denies any chest pressure, neck or arm discomfort.  There is no new shortness of breath.   Allergies  Allergen Reactions  . Erythromycin Swelling and Other (See Comments)    Reaction unknown  . Penicillins Other (See Comments)    Reaction unknown  Has patient had a PCN reaction causing immediate rash, facial/tongue/throat swelling, SOB or lightheadedness with hypotension: unknown Has patient had a PCN reaction causing severe rash involving mucus membranes or skin necrosis: unknown Has patient had a PCN reaction that required hospitalization : unknown Has patient had a PCN reaction occurring within the last 10 years: no If all of the above answers are "NO", then may proceed with Cephalosporin use.     Current Outpatient Medications  Medication Sig Dispense Refill  . atenolol (TENORMIN) 50 MG tablet Take 50 mg by mouth daily.    . captopril (CAPOTEN) 50 MG tablet TAKE (1) TABLET TWICE A DAY FOR HIGH BLOOD PRESSURE. 60 tablet 5  . ciprofloxacin (CIPRO) 500 MG tablet Take 1 tablet (500 mg total) by mouth 2 (two) times daily. 14 tablet 0  . ezetimibe (ZETIA) 10 MG tablet TAKE 1 TABLET ONCE DAILY FOR CHOLESTEROL 90 tablet 1   . potassium chloride (K-DUR) 10 MEQ tablet TAKE 1 TABLET DAILY 30 tablet 5  . pravastatin (PRAVACHOL) 40 MG tablet Take 40 mg by mouth daily.    . Vitamin D, Ergocalciferol, (DRISDOL) 50000 units CAPS capsule TAKE 1 CAPSULE ONCE A WEEK 12 capsule 0  . traMADol (ULTRAM) 50 MG tablet Take 1-2 tablets (50-100 mg total) by mouth every 6 (six) hours as needed for moderate pain. (Patient not taking: Reported on 10/03/2017) 60 tablet 0   No current facility-administered medications for this visit.     Past Medical History:  Diagnosis Date  . Adenomatous polyps   . AF (atrial fibrillation) ([place])   . Cataract   . Gallstones   . Hx of adenomatous colonic polyps   . Hyperlipidemia    x5 years  . Hypertension    x5 years  . Kyphosis   . Metabolic syndrome X   . Obesity   . Osteoporosis   . Stroke ([place])   . Symptomatic menopausal or female climacteric states   . Vitamin D deficiency     Past Surgical History:  Procedure Laterality Date  . ABDOMINAL HYSTERECTOMY    . BLADDER SUSPENSION    . COLONOSCOPY  multiple  . EYE SURGERY Bilateral 09/2015  . VAGINAL HYSTERECTOMY     total    ROS:  Decreased memory.  Otherwise, as stated in  the HPI and negative for all other systems.  PHYSICAL EXAM BP (!) 170/85   Pulse 95   Ht 5' 1.5" (1.562 m)   Wt 151 lb (68.5 kg)   SpO2 95%   BMI 28.07 kg/m   GENERAL:  Well appearing NECK:  No jugular venous distention, waveform within normal limits, carotid upstroke brisk and symmetric, no bruits, no thyromegaly LUNGS:  Clear to auscultation bilaterally CHEST:  Unremarkable HEART:  PMI not displaced or sustained,S1 and S2 within normal limits, no S3, no clicks, no rubs, no murmurs, irregular ABD:  Flat, positive bowel sounds normal in frequency in pitch, no bruits, no rebound, no guarding, no midline pulsatile mass, no hepatomegaly, no splenomegaly EXT:  2 plus pulses throughout, no edema, no cyanosis no clubbing   EKG:  NA   ASSESSMENT AND  PLAN  ATRIAL FIBRILLATION:  Ms. ERMINE STEBBINS has a CHA2DS2 - VASc score of 5.  I would like to restart her on blood thinners but I will first talk to the neurosurgeons to see how long we should wait.  There is a risk with her falling but now her family is hypervigilant and she is rarely walking alone and they think the risk of fall is minimal.  Given that she should be back on blood thinners.   HTN: Her blood pressure is elevated today but they think this is unusual and they are going to keep a blood pressure diary and we can titrate meds based on this.   CVA: She is recovered fairly well from this.  She is going to see the neurologist tomorrow.  FOOT PAIN: She is describing some foot pain and some cold feet.  Her pulses are reduced and I would like to check ABIs.  I have also suggested benfotiamine.   Hospital records.

## 2017-10-02 ENCOUNTER — Encounter: Payer: Self-pay | Admitting: Cardiology

## 2017-10-03 ENCOUNTER — Ambulatory Visit (INDEPENDENT_AMBULATORY_CARE_PROVIDER_SITE_OTHER): Payer: Medicare Other | Admitting: Cardiology

## 2017-10-03 ENCOUNTER — Other Ambulatory Visit: Payer: Self-pay | Admitting: Cardiology

## 2017-10-03 ENCOUNTER — Telehealth: Payer: Self-pay | Admitting: Cardiology

## 2017-10-03 ENCOUNTER — Encounter: Payer: Self-pay | Admitting: Cardiology

## 2017-10-03 VITALS — BP 170/85 | HR 95 | Ht 61.5 in | Wt 151.0 lb

## 2017-10-03 DIAGNOSIS — S065XAA Traumatic subdural hemorrhage with loss of consciousness status unknown, initial encounter: Secondary | ICD-10-CM

## 2017-10-03 DIAGNOSIS — I482 Chronic atrial fibrillation, unspecified: Secondary | ICD-10-CM

## 2017-10-03 DIAGNOSIS — I10 Essential (primary) hypertension: Secondary | ICD-10-CM | POA: Diagnosis not present

## 2017-10-03 DIAGNOSIS — I639 Cerebral infarction, unspecified: Secondary | ICD-10-CM

## 2017-10-03 DIAGNOSIS — S065X9A Traumatic subdural hemorrhage with loss of consciousness of unspecified duration, initial encounter: Secondary | ICD-10-CM | POA: Diagnosis not present

## 2017-10-03 DIAGNOSIS — R296 Repeated falls: Secondary | ICD-10-CM | POA: Diagnosis not present

## 2017-10-03 DIAGNOSIS — Z8673 Personal history of transient ischemic attack (TIA), and cerebral infarction without residual deficits: Secondary | ICD-10-CM | POA: Diagnosis not present

## 2017-10-03 DIAGNOSIS — M79671 Pain in right foot: Secondary | ICD-10-CM

## 2017-10-03 DIAGNOSIS — M79672 Pain in left foot: Secondary | ICD-10-CM

## 2017-10-03 DIAGNOSIS — R2681 Unsteadiness on feet: Secondary | ICD-10-CM | POA: Diagnosis not present

## 2017-10-03 DIAGNOSIS — M6281 Muscle weakness (generalized): Secondary | ICD-10-CM | POA: Diagnosis not present

## 2017-10-03 NOTE — Telephone Encounter (Signed)
New message ° °Pt son verbalized that he is returning call for RN °

## 2017-10-03 NOTE — Telephone Encounter (Signed)
New message   Pt c/o medication issue:  1. Name of Medication: Vitamin Benfotiamine  2. How are you currently taking this medication (dosage and times per day)? n/a  3. Are you having a reaction (difficulty breathing--STAT)? no  4. What is your medication issue? Pt's son says that his mother was prescribed a vitamin at doc appt today But when they took the paper with the name of the vitamin the pharmacy says they never heard of it. Please call

## 2017-10-03 NOTE — Telephone Encounter (Signed)
Dr. Annette Stable calling, states that he would like to know if it is okay for patient to restart blood-thinner.

## 2017-10-03 NOTE — Patient Instructions (Signed)
Medication Instructions:  Continue current medications  If you need a refill on your cardiac medications before your next appointment, please call your pharmacy.  Labwork: None Ordered   Testing/Procedures: Your physician has requested that you have an ankle brachial index (ABI). During this test an ultrasound and blood pressure cuff are used to evaluate the arteries that supply the arms and legs with blood. Allow thirty minutes for this exam. There are no restrictions or special instructions.  Special Instructions:  BENFOTIAMINE  Follow-Up: Your physician wants you to follow-up in: 6 Months in [place]. You should receive a reminder letter in the mail two months in advance. If you do not receive a letter, please call our office [phone].   Thank you for choosing [facility]!!

## 2017-10-04 NOTE — Telephone Encounter (Signed)
Spoke with pt son, he stated he was able to fine the medication online and he will be ordering it.Kari [name]

## 2017-10-04 NOTE — Telephone Encounter (Signed)
Spoke with pt letting her know as per Dr Annette Stable and Dr Percival Spanish it's ok for her to restart her Eliquis 5 mg BID, pt voice understanding.Marland KitchenMarland Kitchen

## 2017-10-07 ENCOUNTER — Encounter: Payer: Self-pay | Admitting: Diagnostic Neuroimaging

## 2017-10-07 ENCOUNTER — Ambulatory Visit (INDEPENDENT_AMBULATORY_CARE_PROVIDER_SITE_OTHER): Payer: Medicare Other | Admitting: Diagnostic Neuroimaging

## 2017-10-07 VITALS — BP 145/71 | HR 90

## 2017-10-07 DIAGNOSIS — I6340 Cerebral infarction due to embolism of unspecified cerebral artery: Secondary | ICD-10-CM

## 2017-10-07 DIAGNOSIS — I480 Paroxysmal atrial fibrillation: Secondary | ICD-10-CM | POA: Diagnosis not present

## 2017-10-07 DIAGNOSIS — I10 Essential (primary) hypertension: Secondary | ICD-10-CM | POA: Diagnosis not present

## 2017-10-07 DIAGNOSIS — S065X9A Traumatic subdural hemorrhage with loss of consciousness of unspecified duration, initial encounter: Secondary | ICD-10-CM | POA: Diagnosis not present

## 2017-10-07 DIAGNOSIS — S065XAA Traumatic subdural hemorrhage with loss of consciousness status unknown, initial encounter: Secondary | ICD-10-CM

## 2017-10-07 DIAGNOSIS — E785 Hyperlipidemia, unspecified: Secondary | ICD-10-CM

## 2017-10-07 NOTE — Progress Notes (Signed)
[facility] [address] [place]. [place] [address] [phone]       OFFICE CONSULT NOTE  [name] Date of Birth:  1934/08/12 Medical Record Number:  [id number]   Referring MD:  Redge Gainer, MD  Reason for Referral:  Embolic stroke  CHIEF COMPLAINT:  Chief Complaint  Patient presents with  . Cerebrovascular Accident    rm 6, New Pt, dgtr- Kari [name], son- Kari [name], "still in PT 2 x weekly; feet get cold-upcoming vascular scan; occass L leg pain; vision issues; occass choking episode"    HPI:   84 year old female Kari [name] is being seen today in the office for embolic stroke in November 2018. History obtained from patient, daughter, son and chart review. Reviewed all radiology images and labs personally. Kari [name] is a 84 year old caucasican female with PHM atrial fibrillation previously controlled on coumadin, HTN, and HLD who went to [facility] via EMS after she collapsed on the couch, was confused, has left sided weakness, and was unable to use left arm and left leg. Pt was on vacation with her family. Pt was evaluated by tele-stroke but not felt to be a candidate for TPA as the patient was on warfarin with INR of 2.0. CTA of head and neck was completed at [facility] which suggested a right verbral arterial extracranial occlusion, right cerebellar infarct, infarct in right pons and thalamus. Pt was transferred to [facility] for stroke work up. Pt instructed to stop taking warfarin prior to hospital d/c and to start on apixaban. Pt was previously on atorvastatin but was change to pravastatin 20mg . All other medications remained the same. Pt was d/c'd home with outpatient PT.   On 08/24/17, pt feel when she was leaving the post office and hit her head. Denies loss of consciousness. Patient did note some swelling around her left and small laceration about left eyebrow. CT demonstrated a subdural hemorrhage on anticoagulation. Eliquis was  stopped. No medical reversal agents for her anticoagulation available. Repeat CT stable. She remained neuro intact. Pt was on keppra for seizure prophylaxis while in the hospital. Eliquis restarted by cardiologist on 10/03/17.   At todays visit, patient is doing well and is accompanied by her son and daughter.  States she has been ambulating with her cane at all times and denies falls since SDH admission in 08/2017. Patient lives with son, daughter, and husband. Per son and daughter, patient is never left alone and they are assisting her at all times while ambulating in attempts to preventing falls. Son concerned of recent swallowing difficulties since stroke. Swallowing even performed while in hospital and cleared for regular diet. Patient denies swallowing issues. Continues to participate in PT 2x per week and per patient, is getting benefit out of this. Continues to take the Eliquis without increase in bruising or bleeding. Continues to take the pravastatin and Zetia without side effects. Blood pressure at todays visit 145/71 which per daughter is high for patient. Daughter and son are planning on starting to check BP at home. Patient denies new or recent TIA/stroke like symptoms.    ROS:   14 system review of systems performed and negative with exception of: memory loss, confusion, numbness, and difficulty swallowing.   PMH:  Past Medical History:  Diagnosis Date  . Adenomatous polyps   . AF (atrial fibrillation) ([place])   . Cataract   . Gallstones   . Hx of adenomatous colonic polyps   . Hyperlipidemia  x5 years  . Hypertension    x5 years  . Kyphosis   . Metabolic syndrome X   . Obesity   . Osteoporosis   . Stroke ([place]) 07/2017  . Subdural hematoma ([place])   . Symptomatic menopausal or female climacteric states   . Vitamin D deficiency     PSH:  Past Surgical History:  Procedure Laterality Date  . ABDOMINAL HYSTERECTOMY    . BLADDER SUSPENSION    . COLONOSCOPY  multiple  . EYE  SURGERY Bilateral 09/2015  . VAGINAL HYSTERECTOMY     total    Social History:  Social History   Socioeconomic History  . Marital status: Married    Spouse name: Not on file  . Number of children: Not on file  . Years of education: Not on file  . Highest education level: Not on file  Social Needs  . Financial resource strain: Not on file  . Food insecurity - worry: Not on file  . Food insecurity - inability: Not on file  . Transportation needs - medical: Not on file  . Transportation needs - non-medical: Not on file  Occupational History  . Occupation: Farmer  Tobacco Use  . Smoking status: Never Smoker  . Smokeless tobacco: Never Used  Substance and Sexual Activity  . Alcohol use: No  . Drug use: No  . Sexual activity: No  Other Topics Concern  . Not on file  Social History Narrative   Lives with her husband and son, independent   1 son Surveyor, minerals)   1 Daughter Contractor)   2 grandchildren and 1 great-grandchild   12th grade education    Family History:  Family History  Problem Relation Age of Onset  . Heart attack Mother   . Stroke Mother   . Heart attack Father   . Alcohol abuse Father   . Hypertension Sister   . Stroke Sister   . Stroke Brother   . Hypertension Brother   . Heart attack Brother   . Hypertension Brother   . Depression Brother        suicide  . Hypertension Sister   . Heart attack Other   . Stroke Other   . Breast cancer Other   . Colon cancer Neg Hx     Medications:   Current Outpatient Medications on File Prior to Visit  Medication Sig Dispense Refill  . apixaban (ELIQUIS) 5 MG TABS tablet Take 5 mg by mouth 2 (two) times daily.    Marland Kitchen atenolol (TENORMIN) 50 MG tablet Take 50 mg by mouth daily.    . captopril (CAPOTEN) 50 MG tablet TAKE (1) TABLET TWICE A DAY FOR HIGH BLOOD PRESSURE. 60 tablet 5  . ezetimibe (ZETIA) 10 MG tablet TAKE 1 TABLET ONCE DAILY FOR CHOLESTEROL 90 tablet 1  . Melatonin 3 MG TABS Take 3 mg by mouth  at bedtime.    . potassium chloride (K-DUR) 10 MEQ tablet TAKE 1 TABLET DAILY 30 tablet 5  . pravastatin (PRAVACHOL) 40 MG tablet Take 40 mg by mouth daily.    . Vitamin D, Ergocalciferol, (DRISDOL) 50000 units CAPS capsule TAKE 1 CAPSULE ONCE A WEEK 12 capsule 0  . Benfotiamine 150 MG CAPS Take 150 mg by mouth daily.     No current facility-administered medications on file prior to visit.     Allergies:   Allergies  Allergen Reactions  . Erythromycin Swelling and Other (See Comments)    Reaction unknown  . Penicillins Other (See Comments)  Reaction unknown  Has patient had a PCN reaction causing immediate rash, facial/tongue/throat swelling, SOB or lightheadedness with hypotension: unknown Has patient had a PCN reaction causing severe rash involving mucus membranes or skin necrosis: unknown Has patient had a PCN reaction that required hospitalization : unknown Has patient had a PCN reaction occurring within the last 10 years: no If all of the above answers are "NO", then may proceed with Cephalosporin use.     Physical Exam  Vitals:   10/07/17 1028  BP: (!) 145/71  Pulse: 90   There is no height or weight on file to calculate BMI. Vision Screening Comments: 10/07/17 unable, doesn't have glasses with her  General: well developed, elderly caucasian women, well nourished, seated, in no evident distress Head: head normocephalic and atraumatic.   Neck: supple with no carotid or supraclavicular bruits Cardiovascular: irregular rate, no murmurs Musculoskeletal: no deformity Skin:  no rash/petichiae Vascular:  Normal pulses all extremities  Neurologic Exam Mental Status: Awake and fully alert. Recent and remote memory intact during visit conversation. Attention span, concentration and fund of knowledge appropriate. Mood and affect appropriate.  MMSE - Mini Mental State Exam 02/27/2017 02/08/2016 02/02/2015  Orientation to time 5 5 5   Orientation to Place 5 5 5   Registration 3 3 3     Attention/ Calculation 5 4 5   Recall 2 2 3   Language- name 2 objects 2 2 2   Language- repeat 1 1 -  Language- follow 3 step command 3 3 3   Language- read & follow direction 1 1 1   Write a sentence 1 1 1   Copy design 1 1 1   Total score 29 28 -   Cranial Nerves: Fundoscopic exam reveals sharp disc margins. Pupils equal, briskly reactive to light. Extraocular movements full without nystagmus. Visual fields full to confrontation. Hearing intact. Facial sensation intact. Face, tongue, palate moves normally and symmetrically.  Motor: Normal bulk and tone. Normal strength in all tested extremity muscles. Sensory.: intact to touch, position and vibratory sensation. Slight pinprick sensory decrease in distal BLE. Coordination: Rapid alternating movements normal in all extremities. Finger-to-nose and heel-to-shin performed accurately bilaterally. Gait and Station: Arises from chair without minimal difficulty. Stance is normal. Gait demonstrates normal stride length and balance with assistance of cane . Tandem walk not tested.  Reflexes: Brisk and symmetric. Toes downgoing.    NIHSS  0 Modified Rankin  0 HAS-BLED 5 CHA2DS2-VASc 7   Diagnostic Data (Labs, Imaging, Testing)  [facility]  LDL 07/19/17 50 A1c 07/19/17 6.3  Imaging MRI brain w/o contrast 07/19/17 Findings concerning for small infarctsin the right frontal, bilateral parietal, and left occipital lobes.  Transthoracic Echo 07/19/17 Negative bubble study and no cardiac thrombus  CT HEAD 08/24/17 Small acute subdural hematoma over the left convexity measuring 14 mm in thickness at the level of the temporal lobe and 7 mm in thickness at the level of the frontal horns. Very minimal local mass effect and no significant midline shift. Possible punctate focus of acute subarachnoid hemorrhage over the right posterior parietal Region.  CT HEAD 08/25/17 Slight redistribution of blood in the left convexity subdural  space, but overall unchanged size of left subdural hematoma. No midline shift or herniation. No new hemorrhage or hydrocephalus.  CT HEAD 09/24/17 1. Subdural hematoma along the left cerebral convexity has become diffusely low-density. There has been some redistribution with maximal collection now at the frontal parietal convexity,2. Advanced chronic ischemic injury.8  mm in thickness. Mass effect is mild; no  midline shift.     ASSESSMENT: 84 y.o. year old female here with multi-embolic stroke located right cerebellar, right pons and thalamus on 07/2017, subsequent SDH on 08/24/17 (traumatic), now back on anticoagulation (for atrial fibrillation). Vascular risk factors are HTN, HLD and atrial fibrillation. Pt overall is doing well today without complaints.    1. SDH (subdural hematoma) ([place])   2. Cerebrovascular accident (CVA) due to embolism of cerebral artery (H[facility])   3. Paroxysmal atrial fibrillation ([place])   4. Hypertension, unspecified type   5. Hyperlipidemia, unspecified hyperlipidemia type     PLAN:  -continue secondary stroke prevention medications   - Eliquis - Atrial fibrillation  - pravastatin and zetia - cholesterol  - atenolol and captopril - BP control -education on safety with ambulation and Eliquis -consider use of rollator walker as patient fatigues easily during ambulation -encouragement of exercise and healthy diet -monitor BP at home <130/80 -LDL <70 -follow up as needed  Return if symptoms worsen or fail to improve, for return to PCP.   Penni Bombard, MD 0/01/1101, 1:11 PM Certified in Neurology, Neurophysiology and Neuroimaging  [facility] [address] [place], [place] [address] [phone]

## 2017-10-07 NOTE — Patient Instructions (Signed)
-  continue current medications for secondary stroke prevention  - pravastatin and Zetia for cholesterol (goal LDL <70)  - Apixaban for atrial fibrillation   - captopril and atenolol for blood pressure control -check blood pressure at home and maintain blood pressure <130/80 -maintain healthy diet and exercise -safety with ambulation   - use cane  - consider rollator walker with seat -continue physical therapy -follow up as needed

## 2017-10-08 DIAGNOSIS — Z8673 Personal history of transient ischemic attack (TIA), and cerebral infarction without residual deficits: Secondary | ICD-10-CM | POA: Diagnosis not present

## 2017-10-08 DIAGNOSIS — M6281 Muscle weakness (generalized): Secondary | ICD-10-CM | POA: Diagnosis not present

## 2017-10-08 DIAGNOSIS — R296 Repeated falls: Secondary | ICD-10-CM | POA: Diagnosis not present

## 2017-10-08 DIAGNOSIS — R2681 Unsteadiness on feet: Secondary | ICD-10-CM | POA: Diagnosis not present

## 2017-10-09 ENCOUNTER — Telehealth: Payer: Self-pay | Admitting: Family Medicine

## 2017-10-09 DIAGNOSIS — Z8673 Personal history of transient ischemic attack (TIA), and cerebral infarction without residual deficits: Secondary | ICD-10-CM

## 2017-10-09 DIAGNOSIS — R29898 Other symptoms and signs involving the musculoskeletal system: Secondary | ICD-10-CM

## 2017-10-09 DIAGNOSIS — R531 Weakness: Secondary | ICD-10-CM

## 2017-10-09 NOTE — Telephone Encounter (Signed)
[name] called and aware that we will try and extend this order.

## 2017-10-14 ENCOUNTER — Other Ambulatory Visit: Payer: Self-pay | Admitting: Family Medicine

## 2017-10-14 ENCOUNTER — Ambulatory Visit (HOSPITAL_COMMUNITY)
Admission: RE | Admit: 2017-10-14 | Discharge: 2017-10-14 | Disposition: A | Discharge status: Home / Self Care | Payer: Medicare Other | Source: Ambulatory Visit | Attending: Internal Medicine | Admitting: Internal Medicine

## 2017-10-14 DIAGNOSIS — M79672 Pain in left foot: Secondary | ICD-10-CM | POA: Diagnosis not present

## 2017-10-14 DIAGNOSIS — M79671 Pain in right foot: Secondary | ICD-10-CM | POA: Diagnosis not present

## 2017-10-14 DIAGNOSIS — I70203 Unspecified atherosclerosis of native arteries of extremities, bilateral legs: Secondary | ICD-10-CM | POA: Diagnosis not present

## 2017-10-16 ENCOUNTER — Telehealth: Payer: Self-pay

## 2017-10-16 NOTE — Telephone Encounter (Signed)
There were NO papers - we dropped this as a referral on 10/09/17.

## 2017-10-16 NOTE — Telephone Encounter (Signed)
I have not seen any papers to sign.

## 2017-10-16 NOTE — Telephone Encounter (Signed)
Have you signed papers for PT in [place] ?

## 2017-10-25 ENCOUNTER — Other Ambulatory Visit: Payer: Self-pay | Admitting: Family Medicine

## 2017-10-28 ENCOUNTER — Other Ambulatory Visit: Payer: Self-pay | Admitting: *Deleted

## 2017-10-28 MED ORDER — APIXABAN 5 MG PO TABS: 5.0000 mg | ORAL_TABLET | Freq: Two times a day (BID) | ORAL | 0 refills | Status: DC

## 2017-10-29 ENCOUNTER — Encounter: Payer: Self-pay | Admitting: Family Medicine

## 2017-10-29 ENCOUNTER — Ambulatory Visit (INDEPENDENT_AMBULATORY_CARE_PROVIDER_SITE_OTHER): Payer: Medicare Other | Admitting: Family Medicine

## 2017-10-29 VITALS — BP 118/80 | HR 75 | Temp 97.4°F | Ht 61.5 in | Wt 152.0 lb

## 2017-10-29 DIAGNOSIS — M79676 Pain in unspecified toe(s): Secondary | ICD-10-CM | POA: Diagnosis not present

## 2017-10-29 DIAGNOSIS — I6930 Unspecified sequelae of cerebral infarction: Secondary | ICD-10-CM

## 2017-10-29 DIAGNOSIS — I70203 Unspecified atherosclerosis of native arteries of extremities, bilateral legs: Secondary | ICD-10-CM | POA: Diagnosis not present

## 2017-10-29 DIAGNOSIS — T17308A Unspecified foreign body in larynx causing other injury, initial encounter: Secondary | ICD-10-CM

## 2017-10-29 DIAGNOSIS — I639 Cerebral infarction, unspecified: Secondary | ICD-10-CM | POA: Diagnosis not present

## 2017-10-29 DIAGNOSIS — I700 Atherosclerosis of aorta: Secondary | ICD-10-CM | POA: Diagnosis not present

## 2017-10-29 DIAGNOSIS — R9389 Abnormal findings on diagnostic imaging of other specified body structures: Secondary | ICD-10-CM

## 2017-10-29 DIAGNOSIS — I4819 Other persistent atrial fibrillation: Secondary | ICD-10-CM

## 2017-10-29 DIAGNOSIS — L84 Corns and callosities: Secondary | ICD-10-CM | POA: Diagnosis not present

## 2017-10-29 DIAGNOSIS — R1319 Other dysphagia: Secondary | ICD-10-CM | POA: Diagnosis not present

## 2017-10-29 DIAGNOSIS — E559 Vitamin D deficiency, unspecified: Secondary | ICD-10-CM | POA: Diagnosis not present

## 2017-10-29 DIAGNOSIS — E7800 Pure hypercholesterolemia, unspecified: Secondary | ICD-10-CM

## 2017-10-29 DIAGNOSIS — B351 Tinea unguium: Secondary | ICD-10-CM | POA: Diagnosis not present

## 2017-10-29 DIAGNOSIS — I10 Essential (primary) hypertension: Secondary | ICD-10-CM | POA: Diagnosis not present

## 2017-10-29 DIAGNOSIS — I481 Persistent atrial fibrillation: Secondary | ICD-10-CM

## 2017-10-29 DIAGNOSIS — N390 Urinary tract infection, site not specified: Secondary | ICD-10-CM | POA: Diagnosis not present

## 2017-10-29 MED ORDER — ATENOLOL 50 MG PO TABS: 50.0000 mg | ORAL_TABLET | Freq: Every day | ORAL | 3 refills | Status: DC

## 2017-10-29 MED ORDER — PRAVASTATIN SODIUM 40 MG PO TABS: 40.0000 mg | ORAL_TABLET | Freq: Every day | ORAL | 3 refills | Status: DC

## 2017-10-29 NOTE — Patient Instructions (Addendum)
Medicare Annual Wellness Visit  [facility] and the medical providers at [facility] strive to bring you the best medical care.  In doing so we not only want to address your current medical conditions and concerns but also to detect new conditions early and prevent illness, disease and health-related problems.    Medicare offers a yearly Wellness Visit which allows our clinical staff to assess your need for preventative services including immunizations, lifestyle education, counseling to decrease risk of preventable diseases and screening for fall risk and other medical concerns.    This visit is provided free of charge (no copay) for all Medicare recipients. The clinical pharmacists at [facility] have begun to conduct these Wellness Visits which will also include a thorough review of all your medications.    As you primary medical provider recommend that you make an appointment for your Annual Wellness Visit if you have not done so already this year.  You may set up this appointment before you leave today or you may call back ([phone]) and schedule an appointment.  Please make sure when you call that you mention that you are scheduling your Annual Wellness Visit with the clinical pharmacist so that the appointment may be made for the proper length of time.     Continue current medications. Continue good therapeutic lifestyle changes which include good diet and exercise. Fall precautions discussed with patient. If an FOBT was given today- please return it to our front desk. If you are over 79 years old - you may need Prevnar 50 or the adult Pneumonia vaccine.  **Flu shots are available--- please call and schedule a FLU-CLINIC appointment**  After your visit with [place] today you will receive a survey in the mail or online from [employer] regarding your care with [place]. Please take a moment to fill this out. Your feedback is very  important to [place] as you can help [place] better understand your patient needs as well as improve your experience and satisfaction. WE CARE ABOUT YOU!!!   Encourage patient to eat and she the food slowly and try not to talk while eating We will arrange for a swallowing evaluation Follow-up with cardiology as planned Follow-up with vein and vascular as planned Follow-up with urology as planned Continue to monitor blood pressures and heart rates at home and show these to the cardiologist at the next visit.  Please check patient's blood pressure monitor against another monitor at home or bring it to the office to compare readings here in the office to make sure that the blood pressure monitor is correct. Please check with insurance to see if we can reorder additional gait strengthening and balance therapy and let us know and we will place the order Remember that a repeat CT scan of the chest will be needed this coming July because of the pulmonary nodule.

## 2017-10-29 NOTE — Progress Notes (Signed)
Subjective:    Patient ID: Kari [name], female    DOB: 1934/03/13, 84 y.o.   MRN: [id number]  HPI Pt here for follow up and management of chronic medical problems which includes hypertension, hyperlipidemia, and a fib. She is taking medication regularly.  This patient has had some ongoing issues with subdural hematoma chronic atrial fibrillation recurrent urinary tract infections.  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  She also has her paroxysmal atrial fibrillation hypertension and hyperlipidemia.  She is now on Eliquis and pravastatin and Zetia with atenolol and captopril for blood pressure control.  The patient's family has been keeping a close check on their mother.  She has a visit scheduled upcoming with the cardiologist with the vascular and vein specialist and with the urologist.  A recent CT scan of the chest because of a follow-up of a lung nodule was done originally in July 2018.  It was recommended at that time that she have a repeat CT scan done as early as 18 months from the initial CT scan in January 2018 which would be sometime in July of this year.  Blood pressure readings are brought in from the outside and they will be scanned into the record.  The patient's heart rate seems to be running in the 80s and 90s and the blood pressures are running a little bit higher in the 287-867 range systolic and the 80 range diastolic.  We got 146/79 today.  She is currently taking Capoten 50 mg twice daily and atenolol 50 mg once daily.  The patient comes with her son and daughter to the visit today.  They are very attentive to their mother's needs.  They are concerned about the choking issues that she is having especially when eating and with sometimes talking when she is eating.  There was a reference by them that she was supposed to have some type of follow-up with this because of the CVA that she had  had in the past.  We will make arrangements for that to happen.  They deny any problems with chest pain or shortness of breath.  She has not had any further falls.  No issues with her stomach that they are aware of or the patient is aware of.  No complaints today with voiding.  She does not add any extra salt to the food.  They have been giving her boost and they do note that the boost has a high sodium content and this may be playing a role with some the blood pressure readings and I will discussed this with the pharmacist to make sure that they get a nourishment that is lower in content and sodium.   Patient Active Problem List   Diagnosis Date Noted  . Pain in both feet 10/03/2017  . SDH (subdural hematoma) ([place]) 08/24/2017  . CVA (cerebral vascular accident) ([place]) 07/29/2017  . Influenza A 12/06/2016  . Sciatica of right side 04/23/2016  . At high risk for falls 04/23/2016  . Increased BMI 09/30/2015  . Hyperlipidemia 09/07/2015  . Low back pain 09/07/2015  . Hypoxia 09/06/2015  . Orthostatic syncope 09/04/2015  . Orthostatic hypotension 09/04/2015  . Aortic atherosclerosis ([place]) 05/03/2015  . Vitamin D deficiency 02/02/2015  . Pre-diabetes 02/02/2015  . Metabolic syndrome 67/20/9470  . Osteoporosis 03/11/2013  . Atrial fibrillation ([place]) 11/24/2012  . Hypokalemia 05/04/2012  . Obesity   . Symptomatic menopausal or female  climacteric states   . Kyphosis   . Gallstones   . Personal history of adenomatous colonic polyps   . Essential hypertension 03/29/2009   Outpatient Encounter Medications as of 10/29/2017  Medication Sig  . apixaban (ELIQUIS) 5 MG TABS tablet Take 1 tablet (5 mg total) by mouth 2 (two) times daily.  Marland Kitchen atenolol (TENORMIN) 50 MG tablet Take 50 mg by mouth daily.  . captopril (CAPOTEN) 50 MG tablet TAKE (1) TABLET TWICE A DAY FOR HIGH BLOOD PRESSURE.  Marland Kitchen ezetimibe (ZETIA) 10 MG tablet TAKE 1 TABLET ONCE DAILY FOR CHOLESTEROL  . Melatonin 3 MG TABS Take 3 mg by  mouth at bedtime.  . potassium chloride (K-DUR) 10 MEQ tablet TAKE 1 TABLET DAILY  . pravastatin (PRAVACHOL) 40 MG tablet Take 40 mg by mouth daily.  . Vitamin D, Ergocalciferol, (DRISDOL) 50000 units CAPS capsule TAKE 1 CAPSULE ONCE A WEEK  . [DISCONTINUED] Benfotiamine 150 MG CAPS Take 150 mg by mouth daily.  . [DISCONTINUED] pravastatin (PRAVACHOL) 80 MG tablet TAKE 1 TABLET ONCE DAILY FOR CHOLESTEROL   No facility-administered encounter medications on file as of 10/29/2017.       Review of Systems  Constitutional: Negative.   HENT: Negative.   Eyes: Negative.   Respiratory: Positive for choking.   Cardiovascular: Negative.        Elevated BP  Gastrointestinal: Negative.   Endocrine: Negative.   Genitourinary: Negative.   Musculoskeletal: Negative.   Skin: Negative.   Allergic/Immunologic: Negative.   Neurological: Negative.   Hematological: Negative.   Psychiatric/Behavioral: Negative.        Objective:   Physical Exam  Constitutional: She is oriented to person, place, and time. She appears well-developed and well-nourished. No distress.  The patient is pleasant and alert.  HENT:  Head: Normocephalic and atraumatic.  Right Ear: External ear normal.  Left Ear: External ear normal.  Nose: Nose normal.  Mouth/Throat: Oropharynx is clear and moist. No oropharyngeal exudate.  Eyes: Conjunctivae and EOM are normal. Pupils are equal, round, and reactive to light. Right eye exhibits no discharge. Left eye exhibits no discharge. No scleral icterus.  Neck: Normal range of motion. Neck supple. No thyromegaly present.  No bruits thyromegaly or anterior cervical adenopathy  Cardiovascular: Normal rate, regular rhythm, normal heart sounds and intact distal pulses.  No murmur heard. Heart is irregular irregular at 72/min  Pulmonary/Chest: Effort normal and breath sounds normal. No respiratory distress. She has no wheezes. She has no rales.  Clear anteriorly and posteriorly    Abdominal: Soft. Bowel sounds are normal. She exhibits no mass. There is no tenderness. There is no rebound and no guarding.  No abdominal tenderness masses bruits  Musculoskeletal: She exhibits no edema.  Patient uses a cane for ambulation.  She seems a little unstable with standing.  Lymphadenopathy:    She has no cervical adenopathy.  Neurological: She is alert and oriented to person, place, and time. She has normal reflexes. No cranial nerve deficit.  Fairly good strength in both upper and lower extremities and similar to each side.  Skin: Skin is warm and dry. No rash noted.  Psychiatric: She has a normal mood and affect. Her behavior is normal. Judgment and thought content normal.  Nursing note and vitals reviewed.  BP (!) 146/79 (BP Location: Left Arm)   Pulse 75   Temp (!) 97.4 F (36.3 C) (Oral)   Ht 5' 1.5" (1.562 m)   Wt 152 lb (68.9 kg)   BMI 28.26  kg/m        Assessment & Plan:  1. Essential hypertension -The blood pressure reading by [place] today was slightly lower than readings from home.  They will make a special point to check sodium intake and supplements that she has been using. - BMP8+EGFR - CBC with Differential/Platelet - Hepatic function panel  2. Persistent atrial fibrillation ([place]) -Follow-up with cardiology as planned - CBC with Differential/Platelet  3. Thoracic aortic atherosclerosis ([place]) -Continue with as aggressive therapeutic lifestyle changes as possible and current pravastatin. - CBC with Differential/Platelet - Lipid panel  4. Pure hypercholesterolemia -Continue with pravastatin and therapeutic lifestyle changes pending results of lab work - CBC with Differential/Platelet - Lipid panel  5. Vitamin D deficiency - CBC with Differential/Platelet - VITAMIN D 25 Hydroxy (Vit-D Deficiency, Fractures)  6. Cerebrovascular accident (CVA), unspecified mechanism ([place]) -Patient may need additional physical therapy and family will discuss this  with insurance and get back with [place] so that we can order this if needed. - CBC with Differential/Platelet  7. Frequent urinary tract infections -Follow-up with urology as planned - CBC with Differential/Platelet  8. Choking, initial encounter -Swallowing evaluation - CBC with Differential/Platelet  9. Abnormal chest CT -Both the patient and her son and daughter were informed of the need for repeat chest CT sometime this coming July.  This was because of a pulmonary nodule.  No orders of the defined types were placed in this encounter.  Patient Instructions                       Medicare Annual Wellness Visit  [facility] and the medical providers at [facility] strive to bring you the best medical care.  In doing so we not only want to address your current medical conditions and concerns but also to detect new conditions early and prevent illness, disease and health-related problems.    Medicare offers a yearly Wellness Visit which allows our clinical staff to assess your need for preventative services including immunizations, lifestyle education, counseling to decrease risk of preventable diseases and screening for fall risk and other medical concerns.    This visit is provided free of charge (no copay) for all Medicare recipients. The clinical pharmacists at [facility] have begun to conduct these Wellness Visits which will also include a thorough review of all your medications.    As you primary medical provider recommend that you make an appointment for your Annual Wellness Visit if you have not done so already this year.  You may set up this appointment before you leave today or you may call back [phone]) and schedule an appointment.  Please make sure when you call that you mention that you are scheduling your Annual Wellness Visit with the clinical pharmacist so that the appointment may be made for the proper length of time.      Continue current medications. Continue good therapeutic lifestyle changes which include good diet and exercise. Fall precautions discussed with patient. If an FOBT was given today- please return it to our front desk. If you are over 34 years old - you may need Prevnar 62 or the adult Pneumonia vaccine.  **Flu shots are available--- please call and schedule a FLU-CLINIC appointment**  After your visit with [place] today you will receive a survey in the mail or online from [employer] regarding your care with [place]. Please take a moment to fill this out. Your feedback is very important to  [place] as you can help [place] better understand your patient needs as well as improve your experience and satisfaction. WE CARE ABOUT YOU!!!   Encourage patient to eat and she the food slowly and try not to talk while eating We will arrange for a swallowing evaluation Follow-up with cardiology as planned Follow-up with vein and vascular as planned Follow-up with urology as planned Continue to monitor blood pressures and heart rates at home and show these to the cardiologist at the next visit.  Please check patient's blood pressure monitor against another monitor at home or bring it to the office to compare readings here in the office to make sure that the blood pressure monitor is correct. Please check with insurance to see if we can reorder additional gait strengthening and balance therapy and let us know and we will place the order Remember that a repeat CT scan of the chest will be needed this coming July because of the pulmonary nodule.  Arrie Senate MD

## 2017-10-29 NOTE — Addendum Note (Signed)
Addended by: Zannie Cove on: 10/29/2017 11:35 AM   Modules accepted: Orders

## 2017-10-29 NOTE — Addendum Note (Signed)
Addended by: Zannie Cove on: 10/29/2017 11:40 AM   Modules accepted: Orders

## 2017-10-29 NOTE — Addendum Note (Signed)
Addended by: Zannie Cove on: 10/29/2017 02:57 PM   Modules accepted: Orders

## 2017-10-30 LAB — CBC WITH DIFFERENTIAL/PLATELET
BASOS ABS: 0 10*3/uL (ref 0.0–0.2)
Basos: 0 %
EOS (ABSOLUTE): 0.2 10*3/uL (ref 0.0–0.4)
Eos: 3 %
Hematocrit: 40.6 % (ref 34.0–46.6)
Hemoglobin: 11.9 g/dL (ref 11.1–15.9)
Immature Grans (Abs): 0 10*3/uL (ref 0.0–0.1)
Immature Granulocytes: 0 %
LYMPHS ABS: 1.3 10*3/uL (ref 0.7–3.1)
Lymphs: 14 %
MCH: 23.5 pg — AB (ref 26.6–33.0)
MCHC: 29.3 g/dL — ABNORMAL LOW (ref 31.5–35.7)
MCV: 80 fL (ref 79–97)
Monocytes Absolute: 0.6 10*3/uL (ref 0.1–0.9)
Monocytes: 7 %
NEUTROS ABS: 6.7 10*3/uL (ref 1.4–7.0)
Neutrophils: 76 %
PLATELETS: 288 10*3/uL (ref 150–379)
RBC: 5.07 x10E6/uL (ref 3.77–5.28)
RDW: 17.6 % — AB (ref 12.3–15.4)
WBC: 8.9 10*3/uL (ref 3.4–10.8)

## 2017-10-30 LAB — HEPATIC FUNCTION PANEL
ALK PHOS: 82 IU/L (ref 39–117)
ALT: 16 IU/L (ref 0–32)
AST: 22 IU/L (ref 0–40)
Albumin: 3.8 g/dL (ref 3.5–4.7)
Bilirubin Total: 0.4 mg/dL (ref 0.0–1.2)
Bilirubin, Direct: 0.14 mg/dL (ref 0.00–0.40)
TOTAL PROTEIN: 7.4 g/dL (ref 6.0–8.5)

## 2017-10-30 LAB — BMP8+EGFR
BUN / CREAT RATIO: 13 (ref 12–28)
BUN: 8 mg/dL (ref 8–27)
CALCIUM: 9.2 mg/dL (ref 8.7–10.3)
CHLORIDE: 100 mmol/L (ref 96–106)
CO2: 23 mmol/L (ref 20–29)
Creatinine, Ser: 0.64 mg/dL (ref 0.57–1.00)
GFR calc non Af Amer: 84 mL/min/{1.73_m2} (ref >59)
GFR, EST AFRICAN AMERICAN: 97 mL/min/{1.73_m2} (ref >59)
GLUCOSE: 128 mg/dL — AB (ref 65–99)
POTASSIUM: 3.9 mmol/L (ref 3.5–5.2)
Sodium: 139 mmol/L (ref 134–144)

## 2017-10-30 LAB — LIPID PANEL
CHOLESTEROL TOTAL: 116 mg/dL (ref 100–199)
Chol/HDL Ratio: 3 ratio (ref 0.0–4.4)
HDL: 39 mg/dL — ABNORMAL LOW (ref >39)
LDL Calculated: 54 mg/dL (ref 0–99)
TRIGLYCERIDES: 114 mg/dL (ref 0–149)
VLDL Cholesterol Cal: 23 mg/dL (ref 5–40)

## 2017-10-30 LAB — VITAMIN D 25 HYDROXY (VIT D DEFICIENCY, FRACTURES): Vit D, 25-Hydroxy: 45.9 ng/mL (ref 30.0–100.0)

## 2017-10-31 ENCOUNTER — Telehealth: Payer: Self-pay | Admitting: Family Medicine

## 2017-11-04 NOTE — Progress Notes (Signed)
HPI The patient presents for followup of atrial fibrillation.  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  She was switched to Eliquis.  She was seen once in our office in follow up after that.  She was in the hospital in Dec after a fall and subdural hematoma.  She was off the Eliquis.  At the last visit I talked to neurosurgery and I was given the OK to restart DOAC.    Since that time she has had no bleeding issues.  She is unsteady with her gait but her family watches her very carefully.  There is been a referral to PT although she has not heard back from this yet.  The patient denies any new symptoms such as chest discomfort, neck or arm discomfort. There has been no new shortness of breath, PND or orthopnea. There have been no reported palpitations, presyncope or syncope.    Allergies  Allergen Reactions  . Erythromycin Swelling and Other (See Comments)    Reaction unknown  . Penicillins Other (See Comments)    Reaction unknown  Has patient had a PCN reaction causing immediate rash, facial/tongue/throat swelling, SOB or lightheadedness with hypotension: unknown Has patient had a PCN reaction causing severe rash involving mucus membranes or skin necrosis: unknown Has patient had a PCN reaction that required hospitalization : unknown Has patient had a PCN reaction occurring within the last 10 years: no If all of the above answers are "NO", then may proceed with Cephalosporin use.     Current Outpatient Medications  Medication Sig Dispense Refill  . apixaban (ELIQUIS) 5 MG TABS tablet Take 1 tablet (5 mg total) by mouth 2 (two) times daily. 60 tablet 0  . atenolol (TENORMIN) 50 MG tablet Take 1 tablet (50 mg total) by mouth daily. 90 tablet 3  . Benfotiamine 150 MG CAPS Take 150 mg by mouth daily.    . captopril (CAPOTEN) 50 MG tablet TAKE (1) TABLET TWICE A DAY FOR HIGH BLOOD PRESSURE. 60 tablet 5  . ezetimibe (ZETIA) 10 MG  tablet TAKE 1 TABLET ONCE DAILY FOR CHOLESTEROL 90 tablet 0  . Melatonin 3 MG TABS Take 3 mg by mouth at bedtime.    . potassium chloride (K-DUR) 10 MEQ tablet TAKE 1 TABLET DAILY 30 tablet 5  . pravastatin (PRAVACHOL) 40 MG tablet Take 1 tablet (40 mg total) by mouth daily. 90 tablet 3  . Vitamin D, Ergocalciferol, (DRISDOL) 50000 units CAPS capsule TAKE 1 CAPSULE ONCE A WEEK 12 capsule 0   No current facility-administered medications for this visit.     Past Medical History:  Diagnosis Date  . Adenomatous polyps   . AF (atrial fibrillation) ([place])   . Cataract   . Gallstones   . Hx of adenomatous colonic polyps   . Hyperlipidemia    x5 years  . Hypertension    x5 years  . Kyphosis   . Metabolic syndrome X   . Obesity   . Osteoporosis   . Stroke ([place]) 07/2017  . Subdural hematoma ([place])   . Symptomatic menopausal or female climacteric states   . Vitamin D deficiency     Past Surgical History:  Procedure Laterality Date  . ABDOMINAL HYSTERECTOMY    . BLADDER SUSPENSION    . COLONOSCOPY  multiple  . EYE SURGERY Bilateral 09/2015  . VAGINAL HYSTERECTOMY     total    ROS:  As stated in the HPI and  negative for all other systems.  PHYSICAL EXAM BP 110/80   Pulse 100   Ht 5' 1.5" (1.562 m)   Wt 152 lb (68.9 kg)   BMI 28.26 kg/m   GENERAL:  Well appearing NECK:  No jugular venous distention, waveform within normal limits, carotid upstroke brisk and symmetric, no bruits, no thyromegaly LUNGS:  Clear to auscultation bilaterally CHEST:  Unremarkable HEART:  PMI not displaced or sustained,S1 and S2 within normal limits, no S3, no clicks, no rubs, no murmurs, irregular ABD:  Flat, positive bowel sounds normal in frequency in pitch, no bruits, no rebound, no guarding, no midline pulsatile mass, no hepatomegaly, no splenomegaly EXT:  2 plus pulses throughout, no edema, no cyanosis no clubbing   EKG: Atrial fibrillation, rate 101, axis within normal limits, intervals within  normal limits, inferolateral T wave changes unchanged from previous.  11/06/2017    ASSESSMENT AND PLAN  ATRIAL FIBRILLATION:  Ms. ANESIA BLACKWELL has a CHA2DS2 - VASc score of 5.  She is tolerating anticoagulation and she has reasonable rate control and does not notice her fibrillation.  Try and get her into physical therapy so that we can make sure she is as steady as possible and knows how to use a cane or walker.  HTN: Her blood pressure is slightly elevated at home and I reviewed her blood pressure diary.  However, it seems to fluctuate somewhat and I would be leery of her blood pressure being too low so I will not make changes to her medicines.   CVA:   She seems to have recovered reasonably well and she will continue with meds as listed.  I reviewed the neurology records and no new suggestions for further imaging or change in therapy.  FOOT PAIN:  I ordered ABIs and she was thought to have popliteal disease and was referred for a PV consult.  She had this yesterday.  I reviewed this note.  He wants conservative management and no further imaging or change in therapy is indicated.  I have also suggested benfotiamine.   They will be starting this today.

## 2017-11-05 ENCOUNTER — Ambulatory Visit (INDEPENDENT_AMBULATORY_CARE_PROVIDER_SITE_OTHER): Payer: Medicare Other | Admitting: Cardiovascular Disease

## 2017-11-05 ENCOUNTER — Encounter: Payer: Self-pay | Admitting: Cardiovascular Disease

## 2017-11-05 VITALS — BP 110/74 | HR 70 | Ht 61.5 in | Wt 153.0 lb

## 2017-11-05 DIAGNOSIS — I739 Peripheral vascular disease, unspecified: Secondary | ICD-10-CM | POA: Diagnosis not present

## 2017-11-05 DIAGNOSIS — E785 Hyperlipidemia, unspecified: Secondary | ICD-10-CM

## 2017-11-05 DIAGNOSIS — I4891 Unspecified atrial fibrillation: Secondary | ICD-10-CM

## 2017-11-05 DIAGNOSIS — I6340 Cerebral infarction due to embolism of unspecified cerebral artery: Secondary | ICD-10-CM

## 2017-11-05 NOTE — Patient Instructions (Signed)
Medication Instructions: Your physician recommends that you continue on your current medications as directed. Please refer to the Current Medication list given to you today.  If you need a refill on your cardiac medications before your next appointment, please call your pharmacy.    Follow-Up: Your physician wants you to follow-up in: 8 month with Dr. Fletcher Anon. You will receive a reminder letter in the mail two months in advance. If you don't receive a letter, please call our office at [phone] to schedule this follow-up appointment.   Thank you for choosing [facility] at [facility]!!

## 2017-11-05 NOTE — Progress Notes (Signed)
Cardiology Office Note   Date:  11/05/2017   ID:  Kari [name], DOB October 02, 1933, MRN [id number]  PCP:  Chipper Herb, MD  Cardiologist:  Dr. Percival Spanish  Chief Complaint  Patient presents with  . Follow-up  . Edema    Ankles.      History of Present Illness: Kari [name] is a 84 y.o. female who Was referred by Dr. Percival Spanish for evaluation and management of peripheral arterial disease. She has known history of atrial fibrillation, previous stroke while she is on warfarin, essential hypertension and hyperlipidemia.  She is not a smoker and has no history of diabetes.  She recently reported left foot discomfort.  She underwent noninvasive vascular evaluation which showed moderately reduced ABI bilaterally in the 0.5 range.  Duplex showed occluded popliteal artery bilaterally.  The patient's mobility is limited due to prior stroke and she walks slowly.  She denies any calf or thigh claudication. No chest pain or significant dyspnea.   Past Medical History:  Diagnosis Date  . Adenomatous polyps   . AF (atrial fibrillation) ([place])   . Cataract   . Gallstones   . Hx of adenomatous colonic polyps   . Hyperlipidemia    x5 years  . Hypertension    x5 years  . Kyphosis   . Metabolic syndrome X   . Obesity   . Osteoporosis   . Stroke ([place]) 07/2017  . Subdural hematoma ([place])   . Symptomatic menopausal or female climacteric states   . Vitamin D deficiency     Past Surgical History:  Procedure Laterality Date  . ABDOMINAL HYSTERECTOMY    . BLADDER SUSPENSION    . COLONOSCOPY  multiple  . EYE SURGERY Bilateral 09/2015  . VAGINAL HYSTERECTOMY     total     Current Outpatient Medications  Medication Sig Dispense Refill  . apixaban (ELIQUIS) 5 MG TABS tablet Take 1 tablet (5 mg total) by mouth 2 (two) times daily. 60 tablet 0  . atenolol (TENORMIN) 50 MG tablet Take 1 tablet (50 mg total) by mouth daily. 90 tablet 3  . captopril (CAPOTEN) 50 MG tablet TAKE (1) TABLET TWICE  A DAY FOR HIGH BLOOD PRESSURE. 60 tablet 5  . ezetimibe (ZETIA) 10 MG tablet TAKE 1 TABLET ONCE DAILY FOR CHOLESTEROL 90 tablet 0  . Melatonin 3 MG TABS Take 3 mg by mouth at bedtime.    . potassium chloride (K-DUR) 10 MEQ tablet TAKE 1 TABLET DAILY 30 tablet 5  . pravastatin (PRAVACHOL) 40 MG tablet Take 1 tablet (40 mg total) by mouth daily. 90 tablet 3  . Vitamin D, Ergocalciferol, (DRISDOL) 50000 units CAPS capsule TAKE 1 CAPSULE ONCE A WEEK 12 capsule 0   No current facility-administered medications for this visit.     Allergies:   Erythromycin and Penicillins    Social History:  The patient  reports that  has never smoked. she has never used smokeless tobacco. She reports that she does not drink alcohol or use drugs.   Family History:  The patient's family history includes Alcohol abuse in her father; Breast cancer in her other; Depression in her brother; Heart attack in her brother, father, mother, and other; Hypertension in her brother, brother, sister, and sister; Stroke in her brother, mother, other, and sister.    ROS:  Please see the history of present illness.   Otherwise, review of systems are positive for none.   All other systems are reviewed and negative.  PHYSICAL EXAM: VS:  BP 110/74   Pulse 70   Ht 5' 1.5" (1.562 m)   Wt 153 lb (69.4 kg)   BMI 28.44 kg/m   , BMI Body mass index is 28.44 kg/m. GEN: Well nourished, well developed, in no acute distress  HEENT: normal  Neck: no JVD, carotid bruits, or masses Cardiac: RRR; no murmurs, rubs, or gallops,no edema  Respiratory:  clear to auscultation bilaterally, normal work of breathing GI: soft, nontender, nondistended, + BS MS: no deformity or atrophy  Skin: warm and dry, no rash Neuro:  Strength and sensation are intact Psych: euthymic mood, full affect Distal pulses are not palpable.  EKG:  EKG is not ordered today.    Recent Labs: 10/29/2017: ALT 16; BUN 8; Creatinine, Ser 0.64; Hemoglobin 11.9;  Platelets 288; Potassium 3.9; Sodium 139    Lipid Panel    Component Value Date/Time   CHOL 116 10/29/2017 1145   CHOL 123 03/25/2013 0938   TRIG 114 10/29/2017 1145   TRIG 93 01/04/2016 0836   TRIG 104 03/25/2013 0938   HDL 39 (L) 10/29/2017 1145   HDL 39 (L) 01/04/2016 0836   HDL 41 03/25/2013 0938   CHOLHDL 3.0 10/29/2017 1145   LDLCALC 54 10/29/2017 1145   LDLCALC 68 01/06/2014 0940   LDLCALC 61 03/25/2013 0938      Wt Readings from Last 3 Encounters:  11/05/17 153 lb (69.4 kg)  10/29/17 152 lb (68.9 kg)  10/03/17 151 lb (68.5 kg)      No flowsheet data found.    ASSESSMENT AND PLAN:  1.  Peripheral arterial disease: The patient does not seem to have significant calf claudication in spite of occluded popliteal artery bilaterally.  I suspect that this is likely due to her limited functional capacity.  She does have some discomfort in the left foot but that seems to be random and not always with exertion.  I discussed with her the importance of proper foot hygiene.  Unless her symptoms worsen, I am planning to treat her medically.  2.  Atrial fibrillation, tolerating anticoagulation with Eliquis.  3.  Hyperlipidemia: Currently on pravastatin and Zetia.  Most recent LDL was 54.    Disposition:   FU with me in 1 year  Signed,  Kathlyn Sacramento, MD  11/05/2017 11:11 AM    [place]

## 2017-11-06 ENCOUNTER — Encounter: Payer: Self-pay | Admitting: Cardiology

## 2017-11-06 ENCOUNTER — Ambulatory Visit (INDEPENDENT_AMBULATORY_CARE_PROVIDER_SITE_OTHER): Payer: Medicare Other | Admitting: Cardiology

## 2017-11-06 VITALS — BP 110/80 | HR 100 | Ht 61.5 in | Wt 152.0 lb

## 2017-11-06 DIAGNOSIS — I480 Paroxysmal atrial fibrillation: Secondary | ICD-10-CM

## 2017-11-06 DIAGNOSIS — Z9181 History of falling: Secondary | ICD-10-CM | POA: Diagnosis not present

## 2017-11-06 DIAGNOSIS — R2681 Unsteadiness on feet: Secondary | ICD-10-CM | POA: Diagnosis not present

## 2017-11-06 DIAGNOSIS — I6340 Cerebral infarction due to embolism of unspecified cerebral artery: Secondary | ICD-10-CM

## 2017-11-06 DIAGNOSIS — I10 Essential (primary) hypertension: Secondary | ICD-10-CM

## 2017-11-06 NOTE — Patient Instructions (Signed)
Medication Instructions:  The current medical regimen is effective;  continue present plan and medications.  Follow-Up: Follow up in 6 months with Dr. Hochrein.  You will receive a letter in the mail 2 months before you are due.  Please call us when you receive this letter to schedule your follow up appointment.  If you need a refill on your cardiac medications before your next appointment, please call your pharmacy.  Thank you for choosing  [facility]!!       

## 2017-11-07 ENCOUNTER — Other Ambulatory Visit (HOSPITAL_COMMUNITY): Payer: Self-pay | Admitting: Family Medicine

## 2017-11-07 DIAGNOSIS — R1310 Dysphagia, unspecified: Secondary | ICD-10-CM

## 2017-11-11 ENCOUNTER — Ambulatory Visit (HOSPITAL_COMMUNITY)
Admission: RE | Admit: 2017-11-11 | Discharge: 2017-11-11 | Disposition: A | Discharge status: Home / Self Care | Payer: Medicare Other | Source: Ambulatory Visit | Attending: Family Medicine | Admitting: Family Medicine

## 2017-11-11 DIAGNOSIS — R1310 Dysphagia, unspecified: Secondary | ICD-10-CM | POA: Diagnosis not present

## 2017-11-11 DIAGNOSIS — R05 Cough: Secondary | ICD-10-CM | POA: Insufficient documentation

## 2017-11-11 DIAGNOSIS — E669 Obesity, unspecified: Secondary | ICD-10-CM | POA: Insufficient documentation

## 2017-11-11 DIAGNOSIS — I10 Essential (primary) hypertension: Secondary | ICD-10-CM | POA: Insufficient documentation

## 2017-11-11 DIAGNOSIS — E559 Vitamin D deficiency, unspecified: Secondary | ICD-10-CM | POA: Diagnosis not present

## 2017-11-11 DIAGNOSIS — Z8673 Personal history of transient ischemic attack (TIA), and cerebral infarction without residual deficits: Secondary | ICD-10-CM | POA: Insufficient documentation

## 2017-11-11 DIAGNOSIS — T17308A Unspecified foreign body in larynx causing other injury, initial encounter: Secondary | ICD-10-CM

## 2017-11-11 DIAGNOSIS — I639 Cerebral infarction, unspecified: Secondary | ICD-10-CM | POA: Diagnosis not present

## 2017-11-11 DIAGNOSIS — E785 Hyperlipidemia, unspecified: Secondary | ICD-10-CM | POA: Diagnosis not present

## 2017-11-11 DIAGNOSIS — R0902 Hypoxemia: Secondary | ICD-10-CM | POA: Insufficient documentation

## 2017-11-11 DIAGNOSIS — I4891 Unspecified atrial fibrillation: Secondary | ICD-10-CM | POA: Diagnosis not present

## 2017-11-11 NOTE — Progress Notes (Signed)
Modified Barium Swallow Progress Note  Patient Details  Name: ANELA BENSMAN MRN: [id number] Date of Birth: 1933-11-25  Today's Date: 11/11/2017  Modified Barium Swallow completed.  Full report located under Chart Review in the Imaging Section.  Brief recommendations include the following:  Clinical Impression  Pt's oropharyngeal swallow is within functional limits given age. She had one instance of trace, shallow penetration that cleared spontaneously upon completion of the swallow. Suspect a primary esophageal component to her issues. Her more proximal esophagus was prominent (but did not impede bolus flow) and he seemed to have incomplete clearance and retrograde flow of barium more distally. Would consider additional esophageal w/u if symptoms persist. For now, would continue with unrestricted diet textures as tolerated, but did provide pt/family with handout of esophageal precautions.   Swallow Evaluation Recommendations   Recommended Consults: Consider esophageal assessment;Consider GI evaluation   SLP Diet Recommendations: Regular solids;Thin liquid   Liquid Administration via: Cup;Straw   Medication Administration: Whole meds with liquid   Supervision: Patient able to self feed   Compensations: Slow rate;Small sips/bites;Follow solids with liquid   Postural Changes: Remain semi-upright after after feeds/meals (Comment);Seated upright at 90 degrees   Oral Care Recommendations: Oral care BID        Germain Osgood 11/11/2017,12:39 PM  Germain Osgood, M.A. CCC-SLP [phone]

## 2017-11-12 ENCOUNTER — Encounter: Payer: Self-pay | Admitting: Physical Therapy

## 2017-11-12 ENCOUNTER — Ambulatory Visit: Payer: Medicare Other | Attending: Cardiology | Admitting: Physical Therapy

## 2017-11-12 VITALS — BP 132/86 | HR 88

## 2017-11-12 DIAGNOSIS — M6281 Muscle weakness (generalized): Secondary | ICD-10-CM | POA: Diagnosis not present

## 2017-11-12 DIAGNOSIS — R2681 Unsteadiness on feet: Secondary | ICD-10-CM | POA: Diagnosis not present

## 2017-11-12 NOTE — Therapy (Signed)
[facility] [place], [place], [address] Phone: [phone]   Fax:  [phone]  Physical Therapy Evaluation  Patient Details  Name: Kari [name] MRN: [id number] Date of Birth: 03-18-1934 Referring Provider: Minus Breeding MD   Encounter Date: 11/12/2017  PT End of Session - 11/12/17 1352    Visit Number  1    Number of Visits  12    Date for PT Re-Evaluation  12/24/17    PT Start Time  0100    PT Stop Time  0136    PT Time Calculation (min)  36 min       Past Medical History:  Diagnosis Date  . Adenomatous polyps   . AF (atrial fibrillation) ([place])   . Cataract   . Gallstones   . Hx of adenomatous colonic polyps   . Hyperlipidemia    x5 years  . Hypertension    x5 years  . Kyphosis   . Metabolic syndrome X   . Obesity   . Osteoporosis   . Stroke ([place]) 07/2017  . Subdural hematoma ([place])   . Symptomatic menopausal or female climacteric states   . Vitamin D deficiency     Past Surgical History:  Procedure Laterality Date  . ABDOMINAL HYSTERECTOMY    . BLADDER SUSPENSION    . COLONOSCOPY  multiple  . EYE SURGERY Bilateral 09/2015  . VAGINAL HYSTERECTOMY     total    Vitals:   11/12/17 1352  BP: 132/86  Pulse: 88  SpO2: 95%     Subjective Assessment - 11/12/17 1354    Subjective  The patient fell in December 2018 that resulted in a subdural hemotoma and then a CVA in January of 2019.  She presents to the clinic today with a feeling of weakness and one fall reported since December of 2018.  She uses a straight cane for safety.    Patient is accompained by:  Family member Daughter.    Pertinent History  AF; HTN; CVA;     Patient Stated Goals  Get stronger.    Currently in Pain?  Yes    Pain Score  3     Pain Location  Foot    Pain Orientation  Left    Pain Type  Acute pain "Stubbed my toe."         [facility] PT Assessment - 11/12/17 0001      Assessment   Medical Diagnosis  Gait instability.     Referring Provider  Minus Breeding MD    Onset Date/Surgical Date  -- December 2018.      Precautions   Precautions  Fall      Restrictions   Weight Bearing Restrictions  No      Balance Screen   Has the patient fallen in the past 6 months  Yes    How many times?  -- 1.    Has the patient had a decrease in activity level because of a fear of falling?   Yes    Is the patient reluctant to leave their home because of a fear of falling?   No      Home Environment   Living Environment  Private residence      Prior Function   Level of Independence  Independent with community mobility with device      Posture/Postural Control   Posture/Postural Control  Postural limitations    Postural Limitations  Rounded Shoulders;Forward head;Increased thoracic kyphosis  ROM / Strength   AROM / PROM / Strength  AROM;Strength      AROM   Overall AROM Comments  Normal LE AROM.      Strength   Overall Strength Comments  Bilateral hip flex/abduction= 4/5; bilateral knee and ankle strength= 5/5.      Special Tests    Special Tests  -- (+)Romberg test.      Bed Mobility   Bed Mobility  -- HHA required from supine to sit.      Transfers   Transfers  -- Sit to stand with armrests.      Ambulation/Gait   Gait Comments  Patient ambulating with a straight cane with increased cadence and a tendency to scissor on occasions.      Standardized Balance Assessment   Standardized Balance Assessment  Berg Balance Test      Berg Balance Test   Sit to Stand  Able to stand  independently using hands    Standing Unsupported  Able to stand 30 seconds unsupported    Sitting with Back Unsupported but Feet Supported on Floor or Stool  Able to sit safely and securely 2 minutes    Stand to Sit  Controls descent by using hands    Transfers  Able to transfer safely, definite need of hands    Standing Unsupported with Eyes Closed  Able to stand 10 seconds with supervision    Standing Ubsupported with Feet  Together  Needs help to attain position but able to stand for 30 seconds with feet together    From Standing, Reach Forward with Outstretched Arm  Can reach forward >12 cm safely (5")    From Standing Position, Pick up Object from Floor  Unable to pick up shoe, but reaches 2-5 cm (1-2") from shoe and balances independently    From Standing Position, Turn to Look Behind Over each Shoulder  Turn sideways only but maintains balance    Turn 360 Degrees  Needs close supervision or verbal cueing    Standing Unsupported, Alternately Place Feet on Step/Stool  Able to complete >2 steps/needs minimal assist    Standing Unsupported, One Foot in Front  Needs help to step but can hold 15 seconds    Standing on One Leg  Unable to try or needs assist to prevent fall    Total Score  29             Objective measurements completed on examination: See above findings.                   PT Long Term Goals - 11/12/17 1421      PT LONG TERM GOAL #1   Title  Improve Berg score to 46-47/56.    Time  6    Period  Weeks    Status  New      PT LONG TERM GOAL #2   Title  Independent with a HEP.    Time  6    Period  Weeks    Status  New      PT LONG TERM GOAL #3   Title  Bilateral hip strength= 5/5 to increase stability for gait.    Time  6    Period  Weeks    Status  New             Plan - 11/12/17 1415    Clinical Impression Statement  The patient presents to OPPT having fallen recently.  She scored a 29/56  on the Berg balance test.  She has bilateral hip weakness and a tendency for a scissoring gait.  Need cues for slowing down while walking.  Patient will benefit from skilled physical therapy intervention to address weakness and balance deficit.      History and Personal Factors relevant to plan of care:  AF; HTN; CVA; OP.    Clinical Presentation  Evolving    Clinical Decision Making  Moderate    Rehab Potential  Good    PT Frequency  2x / week    PT Duration  6 weeks     PT Treatment/Interventions  Functional mobility training;Therapeutic activities;Therapeutic exercise;Balance training;Patient/family education    PT Next Visit Plan  Bilateral LE strengthening; gait and balance training.    Consulted and Agree with Plan of Care  Patient       Patient will benefit from skilled therapeutic intervention in order to improve the following deficits and impairments:  Abnormal gait, Decreased mobility, Decreased strength, Decreased balance  Visit Diagnosis: Muscle weakness (generalized) - Plan: PT plan of care cert/re-cert  Unsteadiness on feet - Plan: PT plan of care cert/re-cert     Problem List Patient Active Problem List   Diagnosis Date Noted  . Pain in both feet 10/03/2017  . SDH (subdural hematoma) ([place]) 08/24/2017  . CVA (cerebral vascular accident) ([place]) 07/29/2017  . Influenza A 12/06/2016  . Sciatica of right side 04/23/2016  . At high risk for falls 04/23/2016  . Increased BMI 09/30/2015  . Hyperlipidemia 09/07/2015  . Low back pain 09/07/2015  . Hypoxia 09/06/2015  . Orthostatic syncope 09/04/2015  . Orthostatic hypotension 09/04/2015  . Aortic atherosclerosis ([place]) 05/03/2015  . Vitamin D deficiency 02/02/2015  . Pre-diabetes 02/02/2015  . Metabolic syndrome 17/91/5056  . Osteoporosis 03/11/2013  . Atrial fibrillation ([place]) 11/24/2012  . Hypokalemia 05/04/2012  . Obesity   . Symptomatic menopausal or female climacteric states   . Kyphosis   . Gallstones   . Personal history of adenomatous colonic polyps   . Essential hypertension 03/29/2009    Xolani Degracia, [place] MPT 11/12/2017, 2:25 PM  [facility] [address] [place], [place], [address] Phone: [phone]   Fax:  [phone]  Name: Kari [name] MRN: [id number] Date of Birth: 07-Sep-1933

## 2017-11-13 ENCOUNTER — Telehealth: Payer: Self-pay | Admitting: Family Medicine

## 2017-11-13 IMAGING — CT CT HEAD W/O CM
4 of 7 series · 16 of 47 positions shown, 17 images · non-contrast
Comparison: None.

Head and cervical spine CT 09/04/2015.

CLINICAL DATA: 82-year-old female status post fall at 6366 hours
while trying to get back and head. Struck head on night stand. Left
forehead hematoma. On Coumadin. Initial encounter.

EXAM:
CT HEAD WITHOUT CONTRAST
CT CERVICAL SPINE WITHOUT CONTRAST
TECHNIQUE: Multidetector CT imaging of the head and cervical spine was
performed following the standard protocol without intravenous
contrast. Multiplanar CT image reconstructions of the cervical spine
were also generated.

[Series 3: head wo · axial · 0.44mm/px · z∈[+270,+350]mm · 3 of 32 slices shown, 4 images]
[im 8/32  brain]
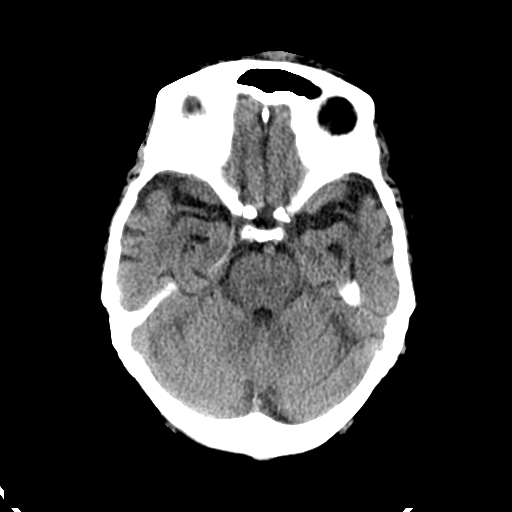
[im 8/32  bone]
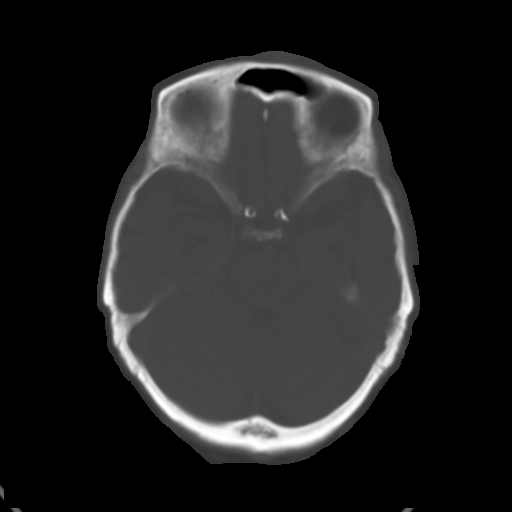
[im 16/32  brain]
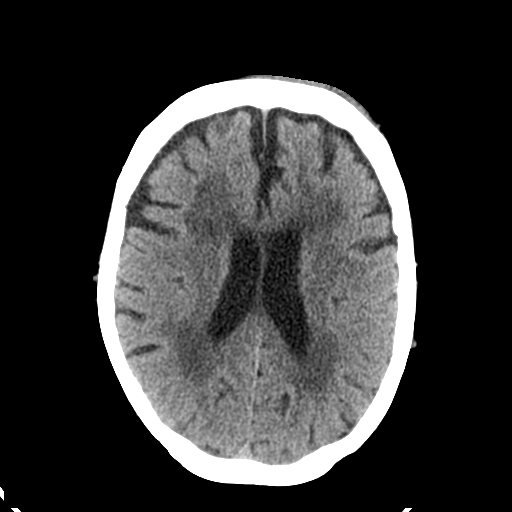
[im 24/32  brain]
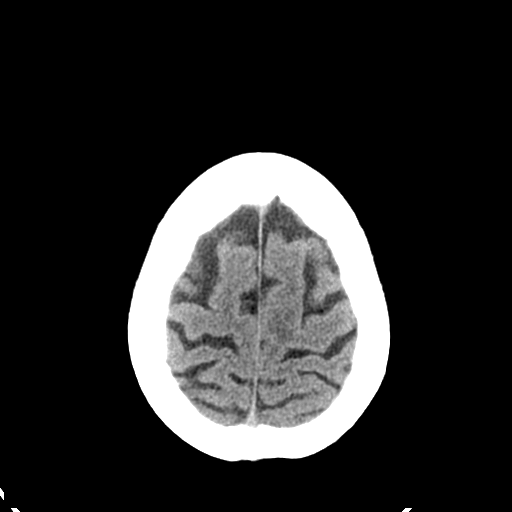

[Series 5: coronal soft tissue · coronal · 0.31mm/px · 3 of 71 slices shown]
[im 24/71  brain]
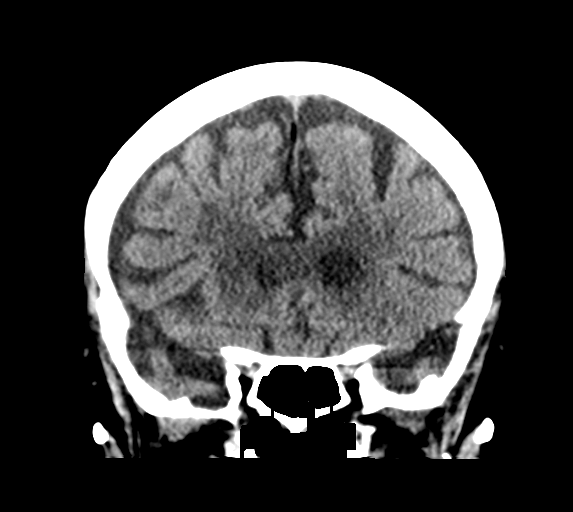
[im 36/71  brain]
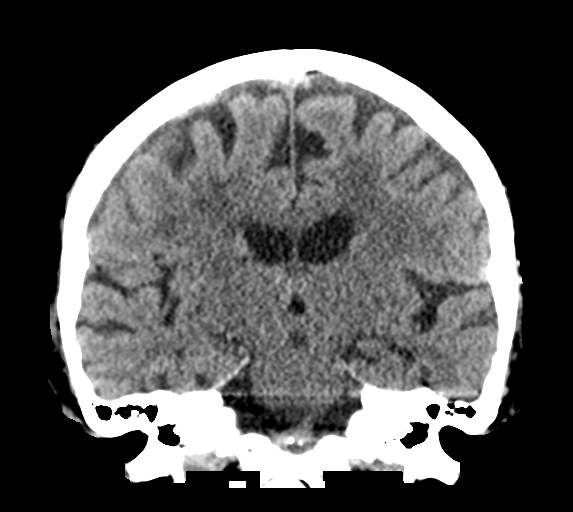
[im 47/71  brain]
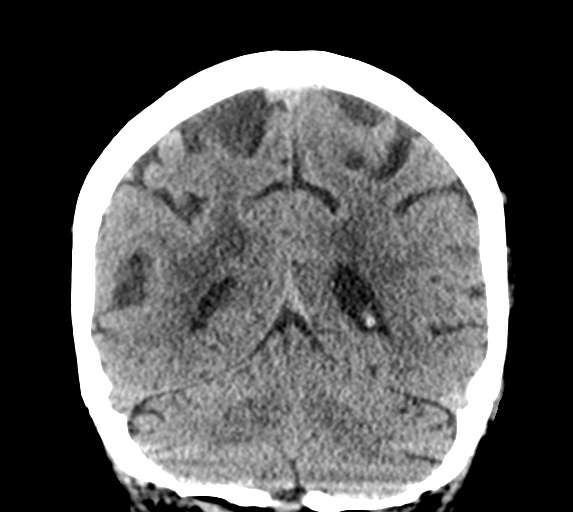

[Series 6: sagittal soft tissue · sagittal · 0.31mm/px · 2 of 62 slices shown]
[im 21/62  brain]
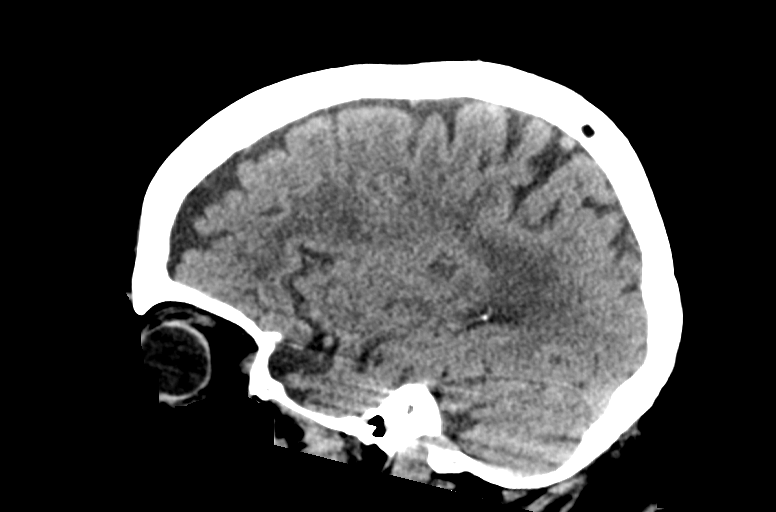
[im 41/62  brain]
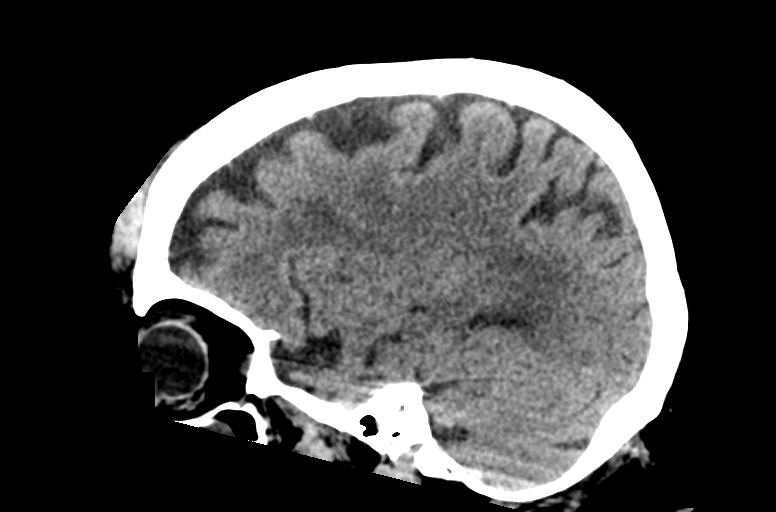

[Series 13: orthogonal bone · axial · 0.21mm/px · z∈[+70,+208]mm · 8 of 86 slices shown]
[im 8/86  bone]
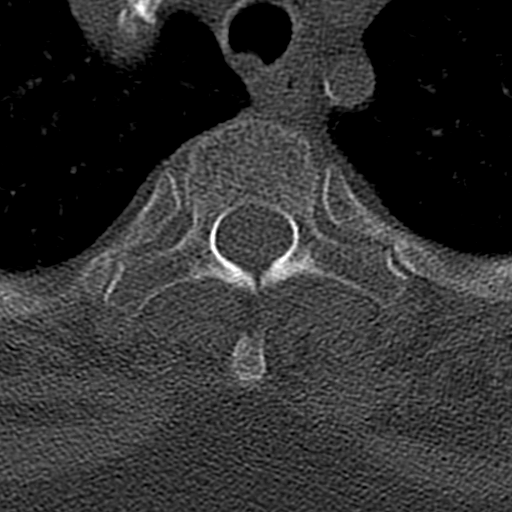
[im 22/86  bone]
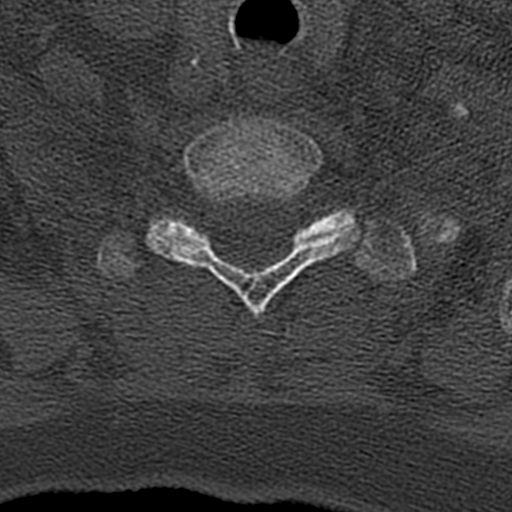
[im 29/86  bone]
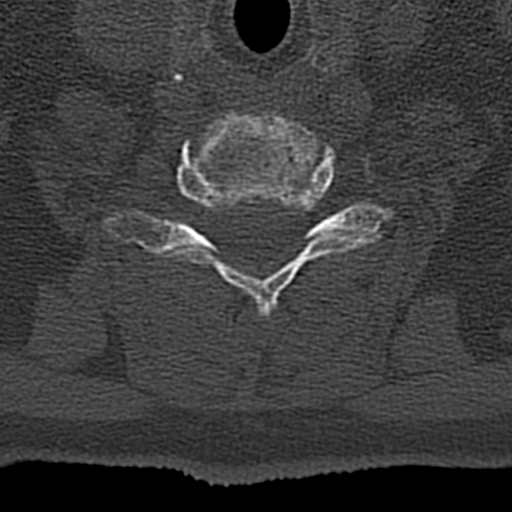
[im 36/86  bone]
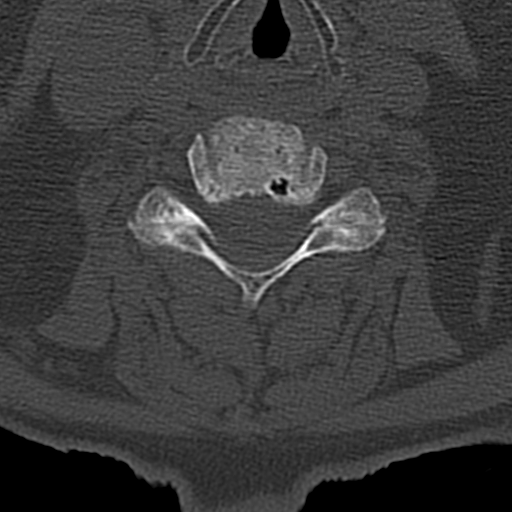
[im 50/86  bone]
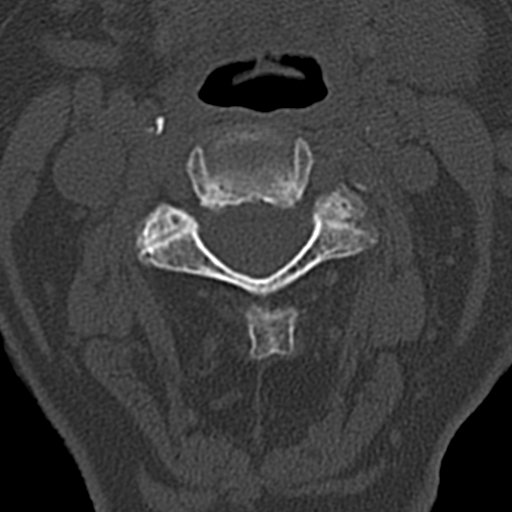
[im 57/86  bone]
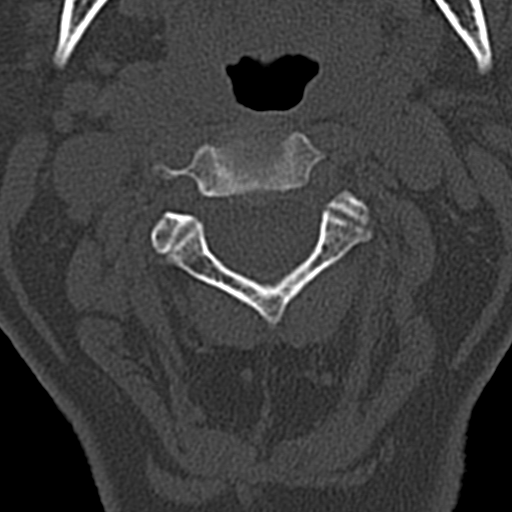
[im 64/86  bone]
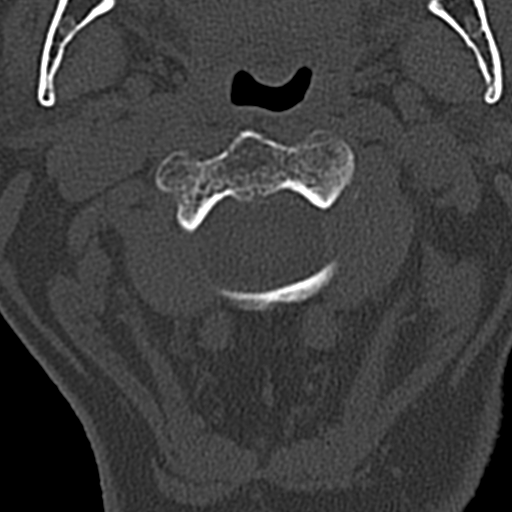
[im 78/86  bone]
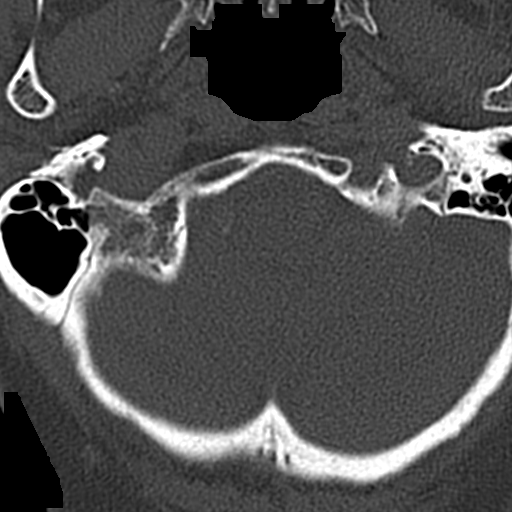

[16 of 47 positions shown; findings below may reference images not displayed]

FINDINGS: CT HEAD FINDINGS

Brain: Stable cerebral volume. Mild subarachnoid CSF is stable along
both anterior convexities. No acute intracranial hemorrhage
identified. No midline shift, mass effect, or evidence of
intracranial mass lesion. Confluent bilateral cerebral white matter
hypodensity. Less pronounced heterogeneity in the deep gray matter
nuclei. Stable gray-white matter differentiation throughout the
brain. No cortically based acute infarct identified.

Vascular: Calcified atherosclerosis at the skull base. No suspicious
intracranial vascular hyperdensity.

Skull: Calvarium intact.

Sinuses/Orbits: Stable with mild paranasal sinus mucosal thickening.
Tympanic cavities and mastoids are clear.

Other: Calcified atherosclerosis at the skull base. Calvarium
intact. Left frontal bone intact. Left frontal sinus is clear.

Other scalp and visible orbits soft tissues appear within normal
limits.

CT CERVICAL SPINE FINDINGS

Alignment: Mildly improved upper cervical lordosis. Mild
anterolisthesis of C4 on C5 is stable. Widespread facet hypertrophy
greater on the left. Cervicothoracic junction alignment is within
normal limits. Bilateral posterior element alignment is within
normal limits.

Skull base and vertebrae: Visualized skull base is intact. No
atlanto-occipital dissociation. Normal C1-C2 alignment. No cervical
spine fracture identified.

Soft tissues and spinal canal: No prevertebral fluid or swelling. No
visible canal hematoma. Negative noncontrast neck soft tissues aside
from calcified carotid atherosclerosis.

Disc levels: Multilevel disc and endplate degeneration. No
significant spinal stenosis suspected.

Upper chest: Visible upper thoracic levels appear grossly intact.
Negative lung apices.

Other:
IMPRESSION: 1. Forehead scalp hematoma without underlying fracture.
2. Stable non contrast CT appearance of the brain. Advanced white
matter changes favored due to chronic small vessel disease.
3. Stable cervical spine. No acute fractureidentified. Ligamentous
injury is not excluded.

## 2017-11-13 NOTE — Telephone Encounter (Signed)
[email or web address]

## 2017-11-14 ENCOUNTER — Ambulatory Visit: Payer: Medicare Other | Admitting: Physical Therapy

## 2017-11-14 ENCOUNTER — Encounter: Payer: Self-pay | Admitting: Physical Therapy

## 2017-11-14 DIAGNOSIS — R2681 Unsteadiness on feet: Secondary | ICD-10-CM | POA: Diagnosis not present

## 2017-11-14 DIAGNOSIS — M6281 Muscle weakness (generalized): Secondary | ICD-10-CM | POA: Diagnosis not present

## 2017-11-14 NOTE — Therapy (Signed)
[facility] [place], [place], [address] Phone: [phone]   Fax:  [phone]  Physical Therapy Treatment  Patient Details  Name: Kari [name] MRN: [id number] Date of Birth: 1934/05/19 Referring Provider: Minus Breeding MD   Encounter Date: 11/14/2017  PT End of Session - 11/14/17 1337    Visit Number  2    Number of Visits  12    Date for PT Re-Evaluation  12/24/17    PT Start Time  1300    PT Stop Time  1340    PT Time Calculation (min)  40 min    Activity Tolerance  Patient tolerated treatment well    Behavior During Therapy  [facility] for tasks assessed/performed       Past Medical History:  Diagnosis Date  . Adenomatous polyps   . AF (atrial fibrillation) ([place])   . Cataract   . Gallstones   . Hx of adenomatous colonic polyps   . Hyperlipidemia    x5 years  . Hypertension    x5 years  . Kyphosis   . Metabolic syndrome X   . Obesity   . Osteoporosis   . Stroke ([place]) 07/2017  . Subdural hematoma ([place])   . Symptomatic menopausal or female climacteric states   . Vitamin D deficiency     Past Surgical History:  Procedure Laterality Date  . ABDOMINAL HYSTERECTOMY    . BLADDER SUSPENSION    . COLONOSCOPY  multiple  . EYE SURGERY Bilateral 09/2015  . VAGINAL HYSTERECTOMY     total    There were no vitals filed for this visit.  Subjective Assessment - 11/14/17 1300    Subjective  Patient arrived feeling well and ready to get stronger    Patient is accompained by:  Family member    Pertinent History  AF; HTN; CVA;     Patient Stated Goals  Get stronger.    Currently in Pain?  No/denies                      [facility] Adult PT Treatment/Exercise - 11/14/17 0001      Exercises   Exercises  Lumbar;Knee/Hip      Lumbar Exercises: Aerobic   Nustep  L3 x46min UE/LE activity monitored      Knee/Hip Exercises: Seated   Long Arc Quad  Strengthening;Both;3 sets;10 reps;Weights    Long Arc Quad  Weight  3 lbs.    Long Arc [employer] Limitations  cues required    [employer]    Clamshell with [employer]  Red x fatigue    Hamstring Curl  Strengthening;Both;20 reps    Hamstring Limitations  red t-band          Balance Exercises - 11/14/17 1331      Balance Exercises: Standing   Standing Eyes Opened  Narrow base of support (BOS);Wide (BOA);Foam/compliant surface;Solid surface;Other reps (comment) several reps x46min    Standing, One Foot on a Step  Eyes open;4 inch;4 reps;10 secs    Marching Limitations  2x10    Heel Raises Limitations  x10    Sit to Stand Time  x5          PT Short Term Goals - 11/14/17 1338      PT SHORT TERM GOAL #1   Title  Independent with a HEP.    Time  4    Period  Weeks    Status  On-going  PT Long Term Goals - 11/14/17 1339      PT LONG TERM GOAL #1   Title  Improve Berg score to 46-47/56.    Time  6    Period  Weeks    Status  On-going      PT LONG TERM GOAL #2   Title  Independent with a HEP.    Time  6    Period  Weeks    Status  On-going      PT LONG TERM GOAL #3   Title  Bilateral hip strength= 5/5 to increase stability for gait.    Time  6    Period  Weeks    Status  On-going            Plan - 11/14/17 1339    Clinical Impression Statement  Patient tolerated treatment well today. Patient required SBA/CGA with balance activities and able to progress slowly with progression today. Patient able to tolerated LE strengthening progression with educational cues for technique and pace. HEP provided for patient. Patient did well today with no complints. Goals ongoing at this time.     Rehab Potential  Good    PT Frequency  2x / week    PT Duration  6 weeks    PT Treatment/Interventions  Functional mobility training;Therapeutic activities;Therapeutic exercise;Balance training;Patient/family education    PT Next Visit Plan  Bilateral LE strengthening; gait and balance training.    Consulted and Agree with Plan of  Care  Patient;Family member/caregiver       Patient will benefit from skilled therapeutic intervention in order to improve the following deficits and impairments:  Abnormal gait, Decreased mobility, Decreased strength, Decreased balance  Visit Diagnosis: Muscle weakness (generalized)  Unsteadiness on feet     Problem List Patient Active Problem List   Diagnosis Date Noted  . Pain in both feet 10/03/2017  . SDH (subdural hematoma) ([place]) 08/24/2017  . CVA (cerebral vascular accident) ([place]) 07/29/2017  . Influenza A 12/06/2016  . Sciatica of right side 04/23/2016  . At high risk for falls 04/23/2016  . Increased BMI 09/30/2015  . Hyperlipidemia 09/07/2015  . Low back pain 09/07/2015  . Hypoxia 09/06/2015  . Orthostatic syncope 09/04/2015  . Orthostatic hypotension 09/04/2015  . Aortic atherosclerosis ([place]) 05/03/2015  . Vitamin D deficiency 02/02/2015  . Pre-diabetes 02/02/2015  . Metabolic syndrome 07/62/2633  . Osteoporosis 03/11/2013  . Atrial fibrillation ([place]) 11/24/2012  . Hypokalemia 05/04/2012  . Obesity   . Symptomatic menopausal or female climacteric states   . Kyphosis   . Gallstones   . Personal history of adenomatous colonic polyps   . Essential hypertension 03/29/2009    Phillips Climes, PTA 11/14/2017, 1:42 PM  [facility] [address] [place], [place], [address] Phone: [phone]   Fax:  [phone]  Name: Kari [name] MRN: [id number] Date of Birth: [birth year]

## 2017-11-14 NOTE — Patient Instructions (Signed)
    High Stepping   Using support, lift knees, taking high steps. Repeat __5-10__ times. Do __1-2__ sessions per day.    Lower Body: Toe Rise   Standing, place feet apart. Hold arms out for balance or use support. Rise up on toes. Hold _3___ seconds, then lower. Repeat immediately. Repeat __10__ times. Do __2__ sessions per day.     Hip Flexion    Stand with feet shoulder width apart. Push buttocks backward and lower slowly, sitting in chair lightly and returning to standing position. Complete _2_ sets of 10_ repetitions. Perform __2-3_ sessions per day.

## 2017-11-15 ENCOUNTER — Telehealth: Payer: Self-pay | Admitting: Family Medicine

## 2017-11-15 NOTE — Telephone Encounter (Signed)
Please review and advise.  Than you

## 2017-11-15 NOTE — Telephone Encounter (Signed)
Swallowing test was normal- yes ok to see message therapist

## 2017-11-15 NOTE — Telephone Encounter (Signed)
Pt calling for swallowing test results. Also wants to know if its okay for her to see a massage therapist. Please advise

## 2017-11-15 NOTE — Telephone Encounter (Signed)
Informed of recommendation Verbalizes understanding

## 2017-11-19 ENCOUNTER — Other Ambulatory Visit: Payer: Medicare Other

## 2017-11-19 ENCOUNTER — Ambulatory Visit: Payer: Medicare Other | Admitting: Physical Therapy

## 2017-11-19 DIAGNOSIS — R2681 Unsteadiness on feet: Secondary | ICD-10-CM | POA: Diagnosis not present

## 2017-11-19 DIAGNOSIS — M6281 Muscle weakness (generalized): Secondary | ICD-10-CM | POA: Diagnosis not present

## 2017-11-19 DIAGNOSIS — Z1212 Encounter for screening for malignant neoplasm of rectum: Secondary | ICD-10-CM

## 2017-11-19 NOTE — Therapy (Signed)
[facility] [place], [place], [address] Phone: [phone]   Fax:  [phone]  Physical Therapy Treatment  Patient Details  Name: Kari [name] MRN: [id number] Date of Birth: 1934/05/06 Referring Provider: Minus Breeding MD   Encounter Date: 11/19/2017  PT End of Session - 11/19/17 1302    Visit Number  3    Number of Visits  12    Date for PT Re-Evaluation  12/24/17    PT Start Time  1300    PT Stop Time  1345    PT Time Calculation (min)  45 min    Activity Tolerance  Patient tolerated treatment well    Behavior During Therapy  [facility] for tasks assessed/performed       Past Medical History:  Diagnosis Date  . Adenomatous polyps   . AF (atrial fibrillation) ([place])   . Cataract   . Gallstones   . Hx of adenomatous colonic polyps   . Hyperlipidemia    x5 years  . Hypertension    x5 years  . Kyphosis   . Metabolic syndrome X   . Obesity   . Osteoporosis   . Stroke ([place]) 07/2017  . Subdural hematoma ([place])   . Symptomatic menopausal or female climacteric states   . Vitamin D deficiency     Past Surgical History:  Procedure Laterality Date  . ABDOMINAL HYSTERECTOMY    . BLADDER SUSPENSION    . COLONOSCOPY  multiple  . EYE SURGERY Bilateral 09/2015  . VAGINAL HYSTERECTOMY     total    There were no vitals filed for this visit.  Subjective Assessment - 11/19/17 1302    Subjective  Patient reports no falls since last visit.    Pertinent History  AF; HTN; CVA;     Patient Stated Goals  Get stronger.    Currently in Pain?  No/denies                      [facility] Adult PT Treatment/Exercise - 11/19/17 0001      Lumbar Exercises: Aerobic   Nustep  L3 79min UE/LE activity monitored 430 steps      Knee/Hip Exercises: Seated   Long Arc Quad  Strengthening;Both;10 reps;Weights;2 sets 5 sec hold    Long Arc Quad Weight  3 lbs.    Ball Squeeze  x20    Clamshell with [employer]   Strengthening;Both;2 sets;10 reps;Weights    Marching Limitations  3# wt.    Hamstring Curl  Strengthening;Both;20 reps    Hamstring Limitations  red          Balance Exercises - 11/19/17 1340      Balance Exercises: Standing   Standing Eyes Opened  Narrow base of support (BOS);Wide (BOA);Head turns;Foam/compliant surface;Time 5 min    Standing, One Foot on a Step  2 inch;Foam/compliant surface;Eyes open;2 reps;30 secs Bil    Stepping Strategy  Anterior toe taps to 2 inch step no UE support x 8 ea with 2 LOB    Step Ups  Forward;6 inch;UE support 2 x 10 each side    Other Standing Exercises  step ups on to foam, BUE support x 10 ea          PT Short Term Goals - 11/14/17 1338      PT SHORT TERM GOAL #1   Title  Independent with a HEP.    Time  4    Period  Weeks    Status  On-going        PT Long Term Goals - 11/14/17 1339      PT LONG TERM GOAL #1   Title  Improve Berg score to 46-47/56.    Time  6    Period  Weeks    Status  On-going      PT LONG TERM GOAL #2   Title  Independent with a HEP.    Time  6    Period  Weeks    Status  On-going      PT LONG TERM GOAL #3   Title  Bilateral hip strength= 5/5 to increase stability for gait.    Time  6    Period  Weeks    Status  On-going            Plan - 11/19/17 1410    Clinical Impression Statement  Patient did very well with balance activities today. She has difficulty initiating some tasks but once she does a few reps she improves overall, specifically toe taps. She continues to experience LOB with balance activities, but has good core activation on foam.      PT Treatment/Interventions  Functional mobility training;Therapeutic activities;Therapeutic exercise;Balance training;Patient/family education    PT Next Visit Plan  Bilateral LE strengthening; gait and balance training.       Patient will benefit from skilled therapeutic intervention in order to improve the following deficits and impairments:   Abnormal gait, Decreased mobility, Decreased strength, Decreased balance  Visit Diagnosis: Unsteadiness on feet  Muscle weakness (generalized)     Problem List Patient Active Problem List   Diagnosis Date Noted  . Pain in both feet 10/03/2017  . SDH (subdural hematoma) ([place]) 08/24/2017  . CVA (cerebral vascular accident) ([place]) 07/29/2017  . Influenza A 12/06/2016  . Sciatica of right side 04/23/2016  . At high risk for falls 04/23/2016  . Increased BMI 09/30/2015  . Hyperlipidemia 09/07/2015  . Low back pain 09/07/2015  . Hypoxia 09/06/2015  . Orthostatic syncope 09/04/2015  . Orthostatic hypotension 09/04/2015  . Aortic atherosclerosis ([place]) 05/03/2015  . Vitamin D deficiency 02/02/2015  . Pre-diabetes 02/02/2015  . Metabolic syndrome 16/96/7893  . Osteoporosis 03/11/2013  . Atrial fibrillation ([place]) 11/24/2012  . Hypokalemia 05/04/2012  . Obesity   . Symptomatic menopausal or female climacteric states   . Kyphosis   . Gallstones   . Personal history of adenomatous colonic polyps   . Essential hypertension 03/29/2009    Madelyn Flavors PT 11/19/2017, 2:18 PM  [facility] [address] [place], [place], [address] Phone: [phone]   Fax:  [phone]  Name: Kari [name] MRN: [id number] Date of Birth: 06-21-1934

## 2017-11-21 ENCOUNTER — Ambulatory Visit: Payer: Medicare Other | Admitting: Physical Therapy

## 2017-11-21 DIAGNOSIS — R2681 Unsteadiness on feet: Secondary | ICD-10-CM

## 2017-11-21 DIAGNOSIS — M6281 Muscle weakness (generalized): Secondary | ICD-10-CM

## 2017-11-21 NOTE — Therapy (Signed)
[facility] [place], [place], [address] Phone: [phone]   Fax:  [phone]  Physical Therapy Treatment  Patient Details  Name: Kari [name] MRN: [id number] Date of Birth: 08/14/1934 Referring Provider: Minus Breeding MD   Encounter Date: 11/21/2017  PT End of Session - 11/21/17 1353    Visit Number  4    Number of Visits  12    Date for PT Re-Evaluation  12/24/17    PT Start Time  9518    PT Stop Time  1430    PT Time Calculation (min)  45 min    Activity Tolerance  Patient tolerated treatment well    Behavior During Therapy  [facility] for tasks assessed/performed       Past Medical History:  Diagnosis Date  . Adenomatous polyps   . AF (atrial fibrillation) ([place])   . Cataract   . Gallstones   . Hx of adenomatous colonic polyps   . Hyperlipidemia    x5 years  . Hypertension    x5 years  . Kyphosis   . Metabolic syndrome X   . Obesity   . Osteoporosis   . Stroke ([place]) 07/2017  . Subdural hematoma ([place])   . Symptomatic menopausal or female climacteric states   . Vitamin D deficiency     Past Surgical History:  Procedure Laterality Date  . ABDOMINAL HYSTERECTOMY    . BLADDER SUSPENSION    . COLONOSCOPY  multiple  . EYE SURGERY Bilateral 09/2015  . VAGINAL HYSTERECTOMY     total    There were no vitals filed for this visit.  Subjective Assessment - 11/21/17 1354    Subjective  Patient reported feeling "fine."    Pertinent History  AF; HTN; CVA;     Patient Stated Goals  Get stronger.    Currently in Pain?  No/denies         [facility] PT Assessment - 11/21/17 0001      Assessment   Medical Diagnosis  Gait instability.      Precautions   Precautions  Fall      Restrictions   Weight Bearing Restrictions  No                  [facility] Adult PT Treatment/Exercise - 11/21/17 0001      Lumbar Exercises: Aerobic   Nustep  L3 x33min UE/LE       Knee/Hip Exercises: Standing   Rocker  Board  2 minutes AP, both UE support then 1 UE support      Knee/Hip Exercises: Seated   Long Arc Quad  Strengthening;Both;10 reps;Weights;2 sets    [employer] Weight  3 lbs.    Clamshell with TheraBand  Red 30    Marching  Strengthening;Both;2 sets;10 reps;Weights    Marching Limitations  3# wt.    Sit to Sand  10 reps support on knees          Balance Exercises - 11/21/17 1417      Balance Exercises: Standing   Standing Eyes Opened  Narrow base of support (BOS) 2x 1 minute with no UE support    Step Ups  Forward;6 inch;UE support 2    Sidestepping  Upper extremity support 2 minutes at sink    Marching Limitations  2x10    Heel Raises Limitations  x10    Toe Raise Limitations  x10          PT Short Term Goals - 11/14/17  [place] #1   Title  Independent with a HEP.    Time  4    Period  Weeks    Status  On-going        PT Long Term Goals - 11/14/17 1339      PT LONG TERM GOAL #1   Title  Improve Berg score to 46-47/56.    Time  6    Period  Weeks    Status  On-going      PT LONG TERM GOAL #2   Title  Independent with a HEP.    Time  6    Period  Weeks    Status  On-going      PT LONG TERM GOAL #3   Title  Bilateral hip strength= 5/5 to increase stability for gait.    Time  6    Period  Weeks    Status  On-going            Plan - 11/21/17 1444    Clinical Impression Statement  Patient was able to complete exercises with noted fatigue. Patient educated the importance of exercises to reduce muscle fatigue and improve muscle endurance. Patient reported understanding. Patient noted with minimals sway with feet together EO and felt more confident with balance today.    Clinical Presentation  Evolving    Clinical Decision Making  Moderate    Rehab Potential  Good    PT Frequency  2x / week    PT Duration  6 weeks    PT Treatment/Interventions  Functional mobility training;Therapeutic activities;Therapeutic exercise;Balance  training;Patient/family education    PT Next Visit Plan  Bilateral LE strengthening; gait and balance training.    Consulted and Agree with Plan of Care  Patient       Patient will benefit from skilled therapeutic intervention in order to improve the following deficits and impairments:  Abnormal gait, Decreased mobility, Decreased strength, Decreased balance  Visit Diagnosis: Unsteadiness on feet  Muscle weakness (generalized)     Problem List Patient Active Problem List   Diagnosis Date Noted  . Pain in both feet 10/03/2017  . SDH (subdural hematoma) ([place]) 08/24/2017  . CVA (cerebral vascular accident) ([place]) 07/29/2017  . Influenza A 12/06/2016  . Sciatica of right side 04/23/2016  . At high risk for falls 04/23/2016  . Increased BMI 09/30/2015  . Hyperlipidemia 09/07/2015  . Low back pain 09/07/2015  . Hypoxia 09/06/2015  . Orthostatic syncope 09/04/2015  . Orthostatic hypotension 09/04/2015  . Aortic atherosclerosis ([place]) 05/03/2015  . Vitamin D deficiency 02/02/2015  . Pre-diabetes 02/02/2015  . Metabolic syndrome 79/11/8331  . Osteoporosis 03/11/2013  . Atrial fibrillation ([place]) 11/24/2012  . Hypokalemia 05/04/2012  . Obesity   . Symptomatic menopausal or female climacteric states   . Kyphosis   . Gallstones   . Personal history of adenomatous colonic polyps   . Essential hypertension 03/29/2009   Gabriela Eves, PT, DPT 11/21/2017, 3:23 PM  [facility] [address] [place], [place], [address] Phone: [phone]   Fax:  [phone]  Name: Kari [name] MRN: [id number] Date of Birth: 1934/03/01

## 2017-11-22 LAB — FECAL OCCULT BLOOD, IMMUNOCHEMICAL: Fecal Occult Bld: POSITIVE — AB

## 2017-11-25 ENCOUNTER — Other Ambulatory Visit: Payer: Self-pay | Admitting: Family Medicine

## 2017-11-25 DIAGNOSIS — R351 Nocturia: Secondary | ICD-10-CM | POA: Diagnosis not present

## 2017-11-25 DIAGNOSIS — R311 Benign essential microscopic hematuria: Secondary | ICD-10-CM | POA: Diagnosis not present

## 2017-11-25 DIAGNOSIS — R350 Frequency of micturition: Secondary | ICD-10-CM | POA: Diagnosis not present

## 2017-11-26 ENCOUNTER — Ambulatory Visit: Payer: Medicare Other | Admitting: Physical Therapy

## 2017-11-26 ENCOUNTER — Encounter: Payer: Self-pay | Admitting: Physical Therapy

## 2017-11-26 DIAGNOSIS — M6281 Muscle weakness (generalized): Secondary | ICD-10-CM

## 2017-11-26 DIAGNOSIS — R2681 Unsteadiness on feet: Secondary | ICD-10-CM

## 2017-11-26 NOTE — Therapy (Signed)
[facility] [place], [place], [address] Phone: [phone]   Fax:  [phone]  Physical Therapy Treatment  Patient Details  Name: Kari [name] MRN: [id number] Date of Birth: [birth year] Referring Provider: Minus Breeding MD   Encounter Date: 11/26/2017  PT End of Session - 11/26/17 1304    Visit Number  5    Number of Visits  12    Date for PT Re-Evaluation  12/24/17    PT Start Time  3614    PT Stop Time  1344    PT Time Calculation (min)  41 min    Activity Tolerance  Patient tolerated treatment well    Behavior During Therapy  [facility] for tasks assessed/performed       Past Medical History:  Diagnosis Date  . Adenomatous polyps   . AF (atrial fibrillation) ([place])   . Cataract   . Gallstones   . Hx of adenomatous colonic polyps   . Hyperlipidemia    x5 years  . Hypertension    x5 years  . Kyphosis   . Metabolic syndrome X   . Obesity   . Osteoporosis   . Stroke ([place]) 07/2017  . Subdural hematoma ([place])   . Symptomatic menopausal or female climacteric states   . Vitamin D deficiency     Past Surgical History:  Procedure Laterality Date  . ABDOMINAL HYSTERECTOMY    . BLADDER SUSPENSION    . COLONOSCOPY  multiple  . EYE SURGERY Bilateral 09/2015  . VAGINAL HYSTERECTOMY     total    There were no vitals filed for this visit.  Subjective Assessment - 11/26/17 1304    Subjective  No new complaints upon arrival.    Patient is accompained by:  Family member Daughter    Pertinent History  AF; HTN; CVA;     Patient Stated Goals  Get stronger.    Currently in Pain?  No/denies         [facility] PT Assessment - 11/26/17 0001      Assessment   Medical Diagnosis  Gait instability.      Precautions   Precautions  Fall      Restrictions   Weight Bearing Restrictions  No            No data recorded       [facility] Adult PT Treatment/Exercise - 11/26/17 0001      Lumbar Exercises: Aerobic    Nustep  L3 x15 min UE/LE       Knee/Hip Exercises: Seated   Long Arc Quad  Strengthening;Both;2 sets;10 reps;Weights    Long Arc Quad Weight  4 lbs.    Clamshell with TheraBand  Red 2x10 reps    Sit to Sand  20 reps;without UE support          Balance Exercises - 11/26/17 1355      Balance Exercises: Standing   Standing Eyes Opened  Narrow base of support (BOS);Foam/compliant surface x3 min    Rockerboard  Anterior/posterior;EO    Tandem Gait  Forward;Upper extremity support;Foam/compliant surface;3 reps    Sidestepping  Foam/compliant support;Upper extremity support;4 reps    Heel Raises Limitations  x20 reps    Toe Raise Limitations  x20 reps          PT Short Term Goals - 11/14/17 1338      PT SHORT TERM GOAL #1   Title  Independent with a HEP.    Time  4  Period  Weeks    Status  On-going        PT Long Term Goals - 11/14/17 1339      PT LONG TERM GOAL #1   Title  Improve Berg score to 46-47/56.    Time  6    Period  Weeks    Status  On-going      PT LONG TERM GOAL #2   Title  Independent with a HEP.    Time  6    Period  Weeks    Status  On-going      PT LONG TERM GOAL #3   Title  Bilateral hip strength= 5/5 to increase stability for gait.    Time  6    Period  Weeks    Status  On-going            Plan - 11/26/17 1439    Clinical Impression Statement  Patient continues to tolerate treatment well with no complaints of any increased pain. Patient required intermittant repeated attempts at sit to stands. Appropriate ankle strategy noted with all balance activities and intermittant UE assist.    Rehab Potential  Good    PT Frequency  2x / week    PT Duration  6 weeks    PT Treatment/Interventions  Functional mobility training;Therapeutic activities;Therapeutic exercise;Balance training;Patient/family education    PT Next Visit Plan  Bilateral LE strengthening; gait and balance training.    Consulted and Agree with Plan of Care  Patient        Patient will benefit from skilled therapeutic intervention in order to improve the following deficits and impairments:  Abnormal gait, Decreased mobility, Decreased strength, Decreased balance  Visit Diagnosis: Unsteadiness on feet  Muscle weakness (generalized)     Problem List Patient Active Problem List   Diagnosis Date Noted  . Pain in both feet 10/03/2017  . SDH (subdural hematoma) ([place]) 08/24/2017  . CVA (cerebral vascular accident) ([place]) 07/29/2017  . Influenza A 12/06/2016  . Sciatica of right side 04/23/2016  . At high risk for falls 04/23/2016  . Increased BMI 09/30/2015  . Hyperlipidemia 09/07/2015  . Low back pain 09/07/2015  . Hypoxia 09/06/2015  . Orthostatic syncope 09/04/2015  . Orthostatic hypotension 09/04/2015  . Aortic atherosclerosis ([place]) 05/03/2015  . Vitamin D deficiency 02/02/2015  . Pre-diabetes 02/02/2015  . Metabolic syndrome 56/31/4970  . Osteoporosis 03/11/2013  . Atrial fibrillation ([place]) 11/24/2012  . Hypokalemia 05/04/2012  . Obesity   . Symptomatic menopausal or female climacteric states   . Kyphosis   . Gallstones   . Personal history of adenomatous colonic polyps   . Essential hypertension 03/29/2009    Standley Brooking, PTA 11/26/2017, 2:47 PM  [facility] [address] [place], [place], [address] Phone: [phone]   Fax:  [phone]  Name: AMARIONNA ARCA MRN: [id number] Date of Birth: 08-03-1934

## 2017-11-28 ENCOUNTER — Encounter: Payer: Self-pay | Admitting: Physical Therapy

## 2017-11-28 ENCOUNTER — Ambulatory Visit: Payer: Medicare Other | Admitting: Physical Therapy

## 2017-11-28 DIAGNOSIS — R2681 Unsteadiness on feet: Secondary | ICD-10-CM | POA: Diagnosis not present

## 2017-11-28 DIAGNOSIS — T17308A Unspecified foreign body in larynx causing other injury, initial encounter: Secondary | ICD-10-CM

## 2017-11-28 DIAGNOSIS — I639 Cerebral infarction, unspecified: Secondary | ICD-10-CM

## 2017-11-28 DIAGNOSIS — M6281 Muscle weakness (generalized): Secondary | ICD-10-CM

## 2017-11-28 NOTE — Therapy (Signed)
[facility] [place], [place], [address] Phone: [phone]   Fax:  [phone]  Physical Therapy Treatment  Patient Details  Name: Kari [name] MRN: [id number] Date of Birth: [birth year] Referring Provider: Minus Breeding MD   Encounter Date: 11/28/2017  PT End of Session - 11/28/17 1331    Visit Number  6    Number of Visits  12    Date for PT Re-Evaluation  12/24/17    PT Start Time  1301    PT Stop Time  1343    PT Time Calculation (min)  42 min    Activity Tolerance  Patient tolerated treatment well    Behavior During Therapy  [facility] for tasks assessed/performed       Past Medical History:  Diagnosis Date  . Adenomatous polyps   . AF (atrial fibrillation) ([place])   . Cataract   . Gallstones   . Hx of adenomatous colonic polyps   . Hyperlipidemia    x5 years  . Hypertension    x5 years  . Kyphosis   . Metabolic syndrome X   . Obesity   . Osteoporosis   . Stroke ([place]) 07/2017  . Subdural hematoma ([place])   . Symptomatic menopausal or female climacteric states   . Vitamin D deficiency     Past Surgical History:  Procedure Laterality Date  . ABDOMINAL HYSTERECTOMY    . BLADDER SUSPENSION    . COLONOSCOPY  multiple  . EYE SURGERY Bilateral 09/2015  . VAGINAL HYSTERECTOMY     total    There were no vitals filed for this visit.  Subjective Assessment - 11/28/17 1303    Subjective  Patient reported some discomfort in left ankle for a few weeks after turning it when walking    Patient is accompained by:  Family member    Pertinent History  AF; HTN; CVA;     Currently in Pain?  No/denies         [facility] PT Assessment - 11/28/17 0001      Berg Balance Test   Sit to Stand  Able to stand  independently using hands    Standing Unsupported  Able to stand 2 minutes with supervision    Sitting with Back Unsupported but Feet Supported on Floor or Stool  Able to sit safely and securely 2 minutes    Stand to  Sit  Controls descent by using hands    Transfers  Able to transfer safely, definite need of hands    Standing Unsupported with Eyes Closed  Able to stand 10 seconds with supervision    Standing Ubsupported with Feet Together  Able to place feet together independently but unable to hold for 30 seconds    From Standing, Reach Forward with Outstretched Arm  Can reach forward >12 cm safely (5")    From Standing Position, Pick up Object from Floor  Able to pick up shoe, needs supervision    From Standing Position, Turn to Look Behind Over each Shoulder  Looks behind one side only/other side shows less weight shift    Turn 360 Degrees  Able to turn 360 degrees safely but slowly    Standing Unsupported, Alternately Place Feet on Step/Stool  Able to complete >2 steps/needs minimal assist    Standing Unsupported, One Foot in Front  Needs help to step but can hold 15 seconds    Standing on One Leg  Unable to try or needs assist to prevent fall  Total Score  34            No data recorded       [facility] Adult PT Treatment/Exercise - 11/28/17 0001      Lumbar Exercises: Aerobic   Nustep  L3-4 x15 min UE/LE       Knee/Hip Exercises: Seated   Long Arc Quad  Strengthening;Both;2 sets;10 reps;Weights    Long Arc Quad Weight  4 lbs.    Clamshell with TheraBand  Red x30      Knee/Hip Exercises: Supine   Bridges  Strengthening;Both;20 reps    Other Supine Knee/Hip Exercises  clamshell red t-band x20          Balance Exercises - 11/28/17 1326      Balance Exercises: Standing   Standing Eyes Opened  Narrow base of support (BOS);Wide (BOA);Foam/compliant surface;Solid surface;4 reps;2 reps;Time    Standing Eyes Closed  1 rep;Wide (BOA);Solid surface;Time    Step Ups  6 inch;Forward;UE support 1    Heel Raises Limitations  x10    Toe Raise Limitations  x10    Sit to Stand Time  x10          PT Short Term Goals - 11/28/17 1331      PT SHORT TERM GOAL #1   Title  Independent  with a HEP.    Time  4    Period  Weeks    Status  Achieved 11/28/17        PT Long Term Goals - 11/14/17 1339      PT LONG TERM GOAL #1   Title  Improve Berg score to 46-47/56.    Time  6    Period  Weeks    Status  On-going      PT LONG TERM GOAL #2   Title  Independent with a HEP.    Time  6    Period  Weeks    Status  On-going      PT LONG TERM GOAL #3   Title  Bilateral hip strength= 5/5 to increase stability for gait.    Time  6    Period  Weeks    Status  On-going            Plan - 11/28/17 1332    Clinical Impression Statement  Patient tolerated treatment well today. Patient has reported no falls and is progressing with strengthening and balance activities. Patient improved BERG score to 34/56. Patient reported doing HEP. Met STG #1 others ongoing at this time.     Rehab Potential  Good    PT Frequency  2x / week    PT Duration  6 weeks    PT Treatment/Interventions  Functional mobility training;Therapeutic activities;Therapeutic exercise;Balance training;Patient/family education    PT Next Visit Plan  Bilateral LE strengthening; gait and balance training.    Consulted and Agree with Plan of Care  Patient       Patient will benefit from skilled therapeutic intervention in order to improve the following deficits and impairments:  Abnormal gait, Decreased mobility, Decreased strength, Decreased balance  Visit Diagnosis: Unsteadiness on feet  Muscle weakness (generalized)  Cerebrovascular accident (CVA), unspecified mechanism ([place])  Choking, initial encounter     Problem List Patient Active Problem List   Diagnosis Date Noted  . Pain in both feet 10/03/2017  . SDH (subdural hematoma) ([place]) 08/24/2017  . CVA (cerebral vascular accident) ([place]) 07/29/2017  . Influenza A 12/06/2016  . Sciatica of right side 04/23/2016  .  At high risk for falls 04/23/2016  . Increased BMI 09/30/2015  . Hyperlipidemia 09/07/2015  . Low back pain 09/07/2015  .  Hypoxia 09/06/2015  . Orthostatic syncope 09/04/2015  . Orthostatic hypotension 09/04/2015  . Aortic atherosclerosis ([place]) 05/03/2015  . Vitamin D deficiency 02/02/2015  . Pre-diabetes 02/02/2015  . Metabolic syndrome 00/52/5910  . Osteoporosis 03/11/2013  . Atrial fibrillation ([place]) 11/24/2012  . Hypokalemia 05/04/2012  . Obesity   . Symptomatic menopausal or female climacteric states   . Kyphosis   . Gallstones   . Personal history of adenomatous colonic polyps   . Essential hypertension 03/29/2009    Phillips Climes, PTA 11/28/2017, 1:46 PM  [facility] [address] [place], [place], [address] Phone: [phone]   Fax:  [phone]  Name: PURVI RUEHL MRN: [id number] Date of Birth: 1934-03-09

## 2017-12-03 ENCOUNTER — Ambulatory Visit: Payer: Medicare Other | Attending: Cardiology | Admitting: Physical Therapy

## 2017-12-03 ENCOUNTER — Encounter: Payer: Self-pay | Admitting: Physical Therapy

## 2017-12-03 DIAGNOSIS — M6281 Muscle weakness (generalized): Secondary | ICD-10-CM | POA: Insufficient documentation

## 2017-12-03 DIAGNOSIS — R2681 Unsteadiness on feet: Secondary | ICD-10-CM | POA: Diagnosis not present

## 2017-12-03 NOTE — Therapy (Signed)
[facility] [place], [place], [address] Phone: [phone]   Fax:  [phone]  Physical Therapy Treatment  Patient Details  Name: Kari [name] MRN: [id number] Date of Birth: 11/27/1933 Referring Provider: Minus Breeding MD   Encounter Date: 12/03/2017  PT End of Session - 12/03/17 1304    Visit Number  7    Number of Visits  12    Date for PT Re-Evaluation  12/24/17    PT Start Time  1302    PT Stop Time  1348    PT Time Calculation (min)  46 min    Activity Tolerance  Patient tolerated treatment well    Behavior During Therapy  [facility] for tasks assessed/performed       Past Medical History:  Diagnosis Date  . Adenomatous polyps   . AF (atrial fibrillation) ([place])   . Cataract   . Gallstones   . Hx of adenomatous colonic polyps   . Hyperlipidemia    x5 years  . Hypertension    x5 years  . Kyphosis   . Metabolic syndrome X   . Obesity   . Osteoporosis   . Stroke ([place]) 07/2017  . Subdural hematoma ([place])   . Symptomatic menopausal or female climacteric states   . Vitamin D deficiency     Past Surgical History:  Procedure Laterality Date  . ABDOMINAL HYSTERECTOMY    . BLADDER SUSPENSION    . COLONOSCOPY  multiple  . EYE SURGERY Bilateral 09/2015  . VAGINAL HYSTERECTOMY     total    There were no vitals filed for this visit.  Subjective Assessment - 12/03/17 1304    Subjective  Denies any pain, falls, stumbles, trips at home.    Patient is accompained by:  Family member Daughter    Pertinent History  AF; HTN; CVA;     Patient Stated Goals  Get stronger.    Currently in Pain?  No/denies         [facility] PT Assessment - 12/03/17 0001      Assessment   Medical Diagnosis  Gait instability.      Precautions   Precautions  Fall      Restrictions   Weight Bearing Restrictions  No                   [facility] Adult PT Treatment/Exercise - 12/03/17 0001      Lumbar Exercises: Aerobic    Nustep  L5 x15 min      Knee/Hip Exercises: Standing   Heel Raises  Both;20 reps B toe raise x20 reps    Hip Flexion  AROM;Both;20 reps;Knee bent    Hip Abduction  AROM;Both;20 reps;Knee straight      Knee/Hip Exercises: Seated   Long Arc Quad  Strengthening;Both;3 sets;10 reps;Weights    Long Arc Quad Weight  3 lbs.    Clamshell with TheraBand  Red x30 reps    Hamstring Curl  Strengthening;Both;3 sets;10 reps;Limitations    Hamstring Limitations  red theraband          Balance Exercises - 12/03/17 1350      Balance Exercises: Standing   Standing Eyes Opened  Narrow base of support (BOS);Foam/compliant surface x4 min    Standing Eyes Closed  Narrow base of support (BOS);Solid surface x3 min    Rockerboard  Anterior/posterior;EO;UE support 1 UE support x3 min          PT Short Term Goals - 11/28/17 1331  PT SHORT TERM GOAL #1   Title  Independent with a HEP.    Time  4    Period  Weeks    Status  Achieved 11/28/17        PT Long Term Goals - 11/14/17 1339      PT LONG TERM GOAL #1   Title  Improve Berg score to 46-47/56.    Time  6    Period  Weeks    Status  On-going      PT LONG TERM GOAL #2   Title  Independent with a HEP.    Time  6    Period  Weeks    Status  On-going      PT LONG TERM GOAL #3   Title  Bilateral hip strength= 5/5 to increase stability for gait.    Time  6    Period  Weeks    Status  On-going            Plan - 12/03/17 1353    Clinical Impression Statement  Patient tolerated today's treatment well with no complaints of increased pain. Upon inquiry in regards to when imbalance and falls happen the most her daughter reported more in the evening when she is tired. Patient utilized [facility] upon entry to therapy clinic as well as during and after treatment. Patient demonstrated instabiltiy with uneven surfaces as well as with eyes closed. All balance activities completed within the parallel bars. VCd for reduced UE assist during  treatment although at times patient utilized UEs to correct imbalance.    Rehab Potential  Good    PT Frequency  2x / week    PT Duration  6 weeks    PT Treatment/Interventions  Functional mobility training;Therapeutic activities;Therapeutic exercise;Balance training;Patient/family education    PT Next Visit Plan  Bilateral LE strengthening; gait and balance training.    Consulted and Agree with Plan of Care  Patient       Patient will benefit from skilled therapeutic intervention in order to improve the following deficits and impairments:  Abnormal gait, Decreased mobility, Decreased strength, Decreased balance  Visit Diagnosis: Unsteadiness on feet  Muscle weakness (generalized)     Problem List Patient Active Problem List   Diagnosis Date Noted  . Pain in both feet 10/03/2017  . SDH (subdural hematoma) ([place]) 08/24/2017  . CVA (cerebral vascular accident) ([place]) 07/29/2017  . Influenza A 12/06/2016  . Sciatica of right side 04/23/2016  . At high risk for falls 04/23/2016  . Increased BMI 09/30/2015  . Hyperlipidemia 09/07/2015  . Low back pain 09/07/2015  . Hypoxia 09/06/2015  . Orthostatic syncope 09/04/2015  . Orthostatic hypotension 09/04/2015  . Aortic atherosclerosis ([place]) 05/03/2015  . Vitamin D deficiency 02/02/2015  . Pre-diabetes 02/02/2015  . Metabolic syndrome 46/50/3546  . Osteoporosis 03/11/2013  . Atrial fibrillation ([place]) 11/24/2012  . Hypokalemia 05/04/2012  . Obesity   . Symptomatic menopausal or female climacteric states   . Kyphosis   . Gallstones   . Personal history of adenomatous colonic polyps   . Essential hypertension 03/29/2009    Standley Brooking, PTA 12/03/2017, 1:57 PM  [facility] [address] [place], [place], [address] Phone: [phone]   Fax:  [phone]  Name: RONELLA PLUNK MRN: [id number] Date of Birth: 08/31/1934

## 2017-12-05 ENCOUNTER — Ambulatory Visit: Payer: Medicare Other | Admitting: Physical Therapy

## 2017-12-05 DIAGNOSIS — M6281 Muscle weakness (generalized): Secondary | ICD-10-CM

## 2017-12-05 DIAGNOSIS — R2681 Unsteadiness on feet: Secondary | ICD-10-CM

## 2017-12-05 NOTE — Therapy (Signed)
[facility] [place], [place], [address] Phone: [phone]   Fax:  [phone]  Physical Therapy Treatment  Patient Details  Name: Kari [name] MRN: [id number] Date of Birth: 01-22-1934 Referring Provider: Minus Breeding MD   Encounter Date: 12/05/2017  PT End of Session - 12/05/17 1301    Visit Number  8    Number of Visits  12    Date for PT Re-Evaluation  12/24/17    PT Start Time  1301    PT Stop Time  1343    PT Time Calculation (min)  42 min    Activity Tolerance  Patient tolerated treatment well    Behavior During Therapy  [facility] for tasks assessed/performed       Past Medical History:  Diagnosis Date  . Adenomatous polyps   . AF (atrial fibrillation) ([place])   . Cataract   . Gallstones   . Hx of adenomatous colonic polyps   . Hyperlipidemia    x5 years  . Hypertension    x5 years  . Kyphosis   . Metabolic syndrome X   . Obesity   . Osteoporosis   . Stroke ([place]) 07/2017  . Subdural hematoma ([place])   . Symptomatic menopausal or female climacteric states   . Vitamin D deficiency     Past Surgical History:  Procedure Laterality Date  . ABDOMINAL HYSTERECTOMY    . BLADDER SUSPENSION    . COLONOSCOPY  multiple  . EYE SURGERY Bilateral 09/2015  . VAGINAL HYSTERECTOMY     total    There were no vitals filed for this visit.  Subjective Assessment - 12/05/17 1301    Subjective  Patient reports a near fall today but caught herself. She reported some soreness in her legs.     Pertinent History  AF; HTN; CVA;     Patient Stated Goals  Get stronger.    Currently in Pain?  No/denies         [facility] PT Assessment - 12/05/17 0001      Assessment   Medical Diagnosis  Gait instability.      Precautions   Precautions  Fall      Restrictions   Weight Bearing Restrictions  No                   [facility] Adult PT Treatment/Exercise - 12/05/17 0001      Lumbar Exercises: Aerobic   Nustep  L5  x15 min      Knee/Hip Exercises: Seated   Long Arc Quad  Strengthening;Both;3 sets;10 reps;Weights    Long Arc Quad Weight  3 lbs.    Clamshell with TheraBand  Red 2 minutes    Knee/Hip Flexion  seated marching 3# x20          Balance Exercises - 12/05/17 1315      Balance Exercises: Standing   Standing Eyes Opened  Narrow base of support (BOS);Head turns;Solid surface;4 reps x10 head turns    Rockerboard  Anterior/posterior;EO;UE support;Lateral 3 minutes each    Step Ups  Forward;6 inch;UE support 1    Step Over Hurdles / Cones  step over colored pods forward x3           PT Short Term Goals - 11/28/17 1331      PT SHORT TERM GOAL #1   Title  Independent with a HEP.    Time  4    Period  Weeks    Status  Achieved  11/28/17        PT Long Term Goals - 11/14/17 1339      PT LONG TERM GOAL #1   Title  Improve Berg score to 46-47/56.    Time  6    Period  Weeks    Status  On-going      PT LONG TERM GOAL #2   Title  Independent with a HEP.    Time  6    Period  Weeks    Status  On-going      PT LONG TERM GOAL #3   Title  Bilateral hip strength= 5/5 to increase stability for gait.    Time  6    Period  Weeks    Status  On-going            Plan - 12/05/17 1401    Clinical Impression Statement  Patient was able to tolerate treatment well with multiple rest breaks secondary to soreness. Patient required verbal and tactile cues to lift feet higher during pod step overs. Patient was able to perform without hitting pods for 1 repetition.     Clinical Presentation  Evolving    Clinical Decision Making  Moderate    Rehab Potential  Good    PT Frequency  2x / week    PT Duration  6 weeks    PT Treatment/Interventions  Functional mobility training;Therapeutic activities;Therapeutic exercise;Balance training;Patient/family education    PT Next Visit Plan  Continue step overs. Bilateral LE strengthening; gait and balance training.    Consulted and Agree with  Plan of Care  Patient       Patient will benefit from skilled therapeutic intervention in order to improve the following deficits and impairments:  Abnormal gait, Decreased mobility, Decreased strength, Decreased balance  Visit Diagnosis: Unsteadiness on feet  Muscle weakness (generalized)     Problem List Patient Active Problem List   Diagnosis Date Noted  . Pain in both feet 10/03/2017  . SDH (subdural hematoma) ([place]) 08/24/2017  . CVA (cerebral vascular accident) ([place]) 07/29/2017  . Influenza A 12/06/2016  . Sciatica of right side 04/23/2016  . At high risk for falls 04/23/2016  . Increased BMI 09/30/2015  . Hyperlipidemia 09/07/2015  . Low back pain 09/07/2015  . Hypoxia 09/06/2015  . Orthostatic syncope 09/04/2015  . Orthostatic hypotension 09/04/2015  . Aortic atherosclerosis ([place]) 05/03/2015  . Vitamin D deficiency 02/02/2015  . Pre-diabetes 02/02/2015  . Metabolic syndrome 93/23/5573  . Osteoporosis 03/11/2013  . Atrial fibrillation ([place]) 11/24/2012  . Hypokalemia 05/04/2012  . Obesity   . Symptomatic menopausal or female climacteric states   . Kyphosis   . Gallstones   . Personal history of adenomatous colonic polyps   . Essential hypertension 03/29/2009   Gabriela Eves, PT, DPT 12/05/2017, 2:11 PM  [facility] [address] [place], [place], [address] Phone: [phone]   Fax:  [phone]  Name: Kari [name] MRN: [id number] Date of Birth: 1934/07/20

## 2017-12-10 ENCOUNTER — Ambulatory Visit: Payer: Medicare Other | Admitting: Physical Therapy

## 2017-12-10 ENCOUNTER — Other Ambulatory Visit: Payer: Self-pay | Admitting: Family Medicine

## 2017-12-10 DIAGNOSIS — M6281 Muscle weakness (generalized): Secondary | ICD-10-CM

## 2017-12-10 DIAGNOSIS — R2681 Unsteadiness on feet: Secondary | ICD-10-CM

## 2017-12-10 NOTE — Therapy (Signed)
[facility] [place], [place], [address] Phone: [phone]   Fax:  [phone]  Physical Therapy Treatment  Patient Details  Name: Kari [name] MRN: [id number] Date of Birth: 1933/12/16 Referring Provider: Minus Breeding MD   Encounter Date: 12/10/2017  PT End of Session - 12/10/17 1346    Visit Number  9    Number of Visits  12    Date for PT Re-Evaluation  12/24/17    PT Start Time  1300    PT Stop Time  1344    PT Time Calculation (min)  44 min    Activity Tolerance  Patient tolerated treatment well    Behavior During Therapy  [facility] for tasks assessed/performed       Past Medical History:  Diagnosis Date  . Adenomatous polyps   . AF (atrial fibrillation) ([place])   . Cataract   . Gallstones   . Hx of adenomatous colonic polyps   . Hyperlipidemia    x5 years  . Hypertension    x5 years  . Kyphosis   . Metabolic syndrome X   . Obesity   . Osteoporosis   . Stroke ([place]) 07/2017  . Subdural hematoma ([place])   . Symptomatic menopausal or female climacteric states   . Vitamin D deficiency     Past Surgical History:  Procedure Laterality Date  . ABDOMINAL HYSTERECTOMY    . BLADDER SUSPENSION    . COLONOSCOPY  multiple  . EYE SURGERY Bilateral 09/2015  . VAGINAL HYSTERECTOMY     total    There were no vitals filed for this visit.  Subjective Assessment - 12/10/17 2003    Subjective  Patient reports feeling fine.    Patient is accompained by:  Family member    Pertinent History  AF; HTN; CVA;     Patient Stated Goals  Get stronger.    Currently in Pain?  No/denies                       [facility] Adult PT Treatment/Exercise - 12/10/17 0001      Lumbar Exercises: Aerobic   Nustep  L5 x15 min      Knee/Hip Exercises: Standing   Heel Raises  Both;20 reps followed by toe raises    Walking with Sports Cord  lateral band walking red x2 minutes      Knee/Hip Exercises: Seated   Long Arc Quad   Strengthening;Both;3 sets;10 reps;Weights    Long Arc Quad Weight  4 lbs.    Ball Squeeze  5 second squeeze, 2 minutes    Hamstring Curl  Strengthening;Both;3 sets;10 reps;Limitations    Hamstring Limitations  red theraband    Sit to Sand  20 reps;without UE support          Balance Exercises - 12/10/17 1337      Balance Exercises: Standing   Rockerboard  Anterior/posterior;EO;UE support;Lateral    Balance Beam  --    Step Over Hurdles / Cones  step over colored pods forward and lateral x4 each          PT Short Term Goals - 11/28/17 1331      PT SHORT TERM GOAL #1   Title  Independent with a HEP.    Time  4    Period  Weeks    Status  Achieved 11/28/17        PT Long Term Goals - 11/14/17 1339  PT LONG TERM GOAL #1   Title  Improve Berg score to 46-47/56.    Time  6    Period  Weeks    Status  On-going      PT LONG TERM GOAL #2   Title  Independent with a HEP.    Time  6    Period  Weeks    Status  On-going      PT LONG TERM GOAL #3   Title  Bilateral hip strength= 5/5 to increase stability for gait.    Time  6    Period  Weeks    Status  On-going            Plan - 12/10/17 2004    Clinical Impression Statement  Patient was able to tolerate treatemtn despite reports of fatigue. Patient required multiple cuing for larger steps and step to pattern throughout exercise. Patient also required tactile cuing to perform lateral step overs and multiple verbal cuing to take a large enough step to allow room for following foot to step. Continue cone step overs. Add cone reaching in and out of limits of stability.    Clinical Presentation  Evolving    Clinical Decision Making  Moderate    Rehab Potential  Good    PT Frequency  2x / week    PT Duration  6 weeks    PT Treatment/Interventions  Functional mobility training;Therapeutic activities;Therapeutic exercise;Balance training;Patient/family education    PT Next Visit Plan  Continue step overs.  Bilateral LE strengthening; gait and balance training.    Consulted and Agree with Plan of Care  Patient       Patient will benefit from skilled therapeutic intervention in order to improve the following deficits and impairments:  Abnormal gait, Decreased mobility, Decreased strength, Decreased balance  Visit Diagnosis: Unsteadiness on feet  Muscle weakness (generalized)     Problem List Patient Active Problem List   Diagnosis Date Noted  . Pain in both feet 10/03/2017  . SDH (subdural hematoma) ([place]) 08/24/2017  . CVA (cerebral vascular accident) ([place]) 07/29/2017  . Influenza A 12/06/2016  . Sciatica of right side 04/23/2016  . At high risk for falls 04/23/2016  . Increased BMI 09/30/2015  . Hyperlipidemia 09/07/2015  . Low back pain 09/07/2015  . Hypoxia 09/06/2015  . Orthostatic syncope 09/04/2015  . Orthostatic hypotension 09/04/2015  . Aortic atherosclerosis ([place]) 05/03/2015  . Vitamin D deficiency 02/02/2015  . Pre-diabetes 02/02/2015  . Metabolic syndrome 40/97/3532  . Osteoporosis 03/11/2013  . Atrial fibrillation ([place]) 11/24/2012  . Hypokalemia 05/04/2012  . Obesity   . Symptomatic menopausal or female climacteric states   . Kyphosis   . Gallstones   . Personal history of adenomatous colonic polyps   . Essential hypertension 03/29/2009   Gabriela Eves, PT, DPT 12/10/2017, 8:12 PM  [facility] [address] [place], [place], [address] Phone: [phone]   Fax:  [phone]  Name: Kari [name] MRN: [id number] Date of Birth: 1934-07-06

## 2017-12-12 ENCOUNTER — Ambulatory Visit: Payer: Medicare Other | Admitting: Physical Therapy

## 2017-12-12 DIAGNOSIS — M6281 Muscle weakness (generalized): Secondary | ICD-10-CM

## 2017-12-12 DIAGNOSIS — R2681 Unsteadiness on feet: Secondary | ICD-10-CM

## 2017-12-12 NOTE — Therapy (Signed)
[facility] [place], [place], [address] Phone: [phone]   Fax:  [phone]  Physical Therapy Treatment  Patient Details  Name: Kari [name] MRN: [id number] Date of Birth: June 18, 1934 Referring Provider: Minus Breeding MD   Encounter Date: 12/12/2017  PT End of Session - 12/12/17 1614    Visit Number  10    Number of Visits  12    Date for PT Re-Evaluation  12/24/17    PT Start Time  1300    PT Stop Time  1343    PT Time Calculation (min)  43 min    Activity Tolerance  Patient tolerated treatment well    Behavior During Therapy  [facility] for tasks assessed/performed       Past Medical History:  Diagnosis Date  . Adenomatous polyps   . AF (atrial fibrillation) ([place])   . Cataract   . Gallstones   . Hx of adenomatous colonic polyps   . Hyperlipidemia    x5 years  . Hypertension    x5 years  . Kyphosis   . Metabolic syndrome X   . Obesity   . Osteoporosis   . Stroke ([place]) 07/2017  . Subdural hematoma ([place])   . Symptomatic menopausal or female climacteric states   . Vitamin D deficiency     Past Surgical History:  Procedure Laterality Date  . ABDOMINAL HYSTERECTOMY    . BLADDER SUSPENSION    . COLONOSCOPY  multiple  . EYE SURGERY Bilateral 09/2015  . VAGINAL HYSTERECTOMY     total    There were no vitals filed for this visit.      [facility] PT Assessment - 12/12/17 0001      Assessment   Medical Diagnosis  Gait instability.      Precautions   Precautions  Fall      Restrictions   Weight Bearing Restrictions  No                   [facility] Adult PT Treatment/Exercise - 12/12/17 0001      Lumbar Exercises: Aerobic   Nustep  L5 x15 min      Knee/Hip Exercises: Standing   Heel Raises  Both;20 reps    Hip Abduction  AROM;Both;20 reps;Knee straight    Forward Step Up  Both;10 reps;Hand Hold: 1;Step Height: 6"      Knee/Hip Exercises: Seated   Long Arc Quad  Strengthening;Both;10  reps;Weights;2 sets    [employer] Weight  4 lbs.    Clamshell with TheraBand  Red    Knee/Hip Flexion  seated marching 4# x20    Sit to Sand  --          Balance Exercises - 12/12/17 1309      Balance Exercises: Standing   SLS  Eyes open;Solid surface;Upper extremity support 1;2 reps;30 secs    Balance Beam  lateral stepping with 1 UE support x4    Other Standing Exercises  hand taps in and out of BOS x10 each followed by obstacle course of weaving in and out of bolsters cone step overs, forward walking and backward walking x4          PT Short Term Goals - 11/28/17 1331      PT SHORT TERM GOAL #1   Title  Independent with a HEP.    Time  4    Period  Weeks    Status  Achieved 11/28/17  PT Long Term Goals - 11/14/17 1339      PT LONG TERM GOAL #1   Title  Improve Berg score to 46-47/56.    Time  6    Period  Weeks    Status  On-going      PT LONG TERM GOAL #2   Title  Independent with a HEP.    Time  6    Period  Weeks    Status  On-going      PT LONG TERM GOAL #3   Title  Bilateral hip strength= 5/5 to increase stability for gait.    Time  6    Period  Weeks    Status  On-going            Plan - 12/12/17 1614    Clinical Impression Statement  Patient was able to tolerate treatment with minimal reports of fatigue. Patient required multiple cues for proper form and techinque.    Clinical Presentation  Evolving    Clinical Decision Making  Moderate    Rehab Potential  Good    PT Frequency  2x / week    PT Duration  6 weeks    PT Treatment/Interventions  Functional mobility training;Therapeutic activities;Therapeutic exercise;Balance training;Patient/family education    PT Next Visit Plan  Continue step overs. Bilateral LE strengthening; gait and balance training.    Consulted and Agree with Plan of Care  Patient       Patient will benefit from skilled therapeutic intervention in order to improve the following deficits and impairments:   Abnormal gait, Decreased mobility, Decreased strength, Decreased balance  Visit Diagnosis: Unsteadiness on feet  Muscle weakness (generalized)     Problem List Patient Active Problem List   Diagnosis Date Noted  . Pain in both feet 10/03/2017  . SDH (subdural hematoma) ([place]) 08/24/2017  . CVA (cerebral vascular accident) ([place]) 07/29/2017  . Influenza A 12/06/2016  . Sciatica of right side 04/23/2016  . At high risk for falls 04/23/2016  . Increased BMI 09/30/2015  . Hyperlipidemia 09/07/2015  . Low back pain 09/07/2015  . Hypoxia 09/06/2015  . Orthostatic syncope 09/04/2015  . Orthostatic hypotension 09/04/2015  . Aortic atherosclerosis ([place]) 05/03/2015  . Vitamin D deficiency 02/02/2015  . Pre-diabetes 02/02/2015  . Metabolic syndrome 37/06/6268  . Osteoporosis 03/11/2013  . Atrial fibrillation ([place]) 11/24/2012  . Hypokalemia 05/04/2012  . Obesity   . Symptomatic menopausal or female climacteric states   . Kyphosis   . Gallstones   . Personal history of adenomatous colonic polyps   . Essential hypertension 03/29/2009   Gabriela Eves, PT, DPT 12/12/2017, 4:26 PM  [facility] [address] [place], [place], [address] Phone: [phone]   Fax:  [phone]  Name: Kari [name] MRN: [id number] Date of Birth: [birth year]

## 2017-12-17 ENCOUNTER — Encounter: Payer: Self-pay | Admitting: Physical Therapy

## 2017-12-17 ENCOUNTER — Other Ambulatory Visit: Payer: Medicare Other

## 2017-12-17 ENCOUNTER — Ambulatory Visit: Payer: Medicare Other | Admitting: Physical Therapy

## 2017-12-17 ENCOUNTER — Encounter: Payer: Self-pay | Admitting: *Deleted

## 2017-12-17 DIAGNOSIS — R195 Other fecal abnormalities: Secondary | ICD-10-CM

## 2017-12-17 DIAGNOSIS — M6281 Muscle weakness (generalized): Secondary | ICD-10-CM | POA: Diagnosis not present

## 2017-12-17 DIAGNOSIS — R2681 Unsteadiness on feet: Secondary | ICD-10-CM | POA: Diagnosis not present

## 2017-12-17 NOTE — Therapy (Signed)
[facility] [place], [place], [address] Phone: [phone]   Fax:  [phone]  Physical Therapy Treatment  Patient Details  Name: Kari [name] MRN: [id number] Date of Birth: 1934/03/05 Referring Provider: Minus Breeding MD   Encounter Date: 12/17/2017  PT End of Session - 12/17/17 1308    Visit Number  11    Number of Visits  12    Date for PT Re-Evaluation  12/24/17    PT Start Time  0100    PT Stop Time  0139 Limited by fatigue.  Gave blood today.    PT Time Calculation (min)  39 min       Past Medical History:  Diagnosis Date  . Adenomatous polyps   . AF (atrial fibrillation) ([place])   . Cataract   . Gallstones   . Hx of adenomatous colonic polyps   . Hyperlipidemia    x5 years  . Hypertension    x5 years  . Kyphosis   . Metabolic syndrome X   . Obesity   . Osteoporosis   . Stroke ([place]) 07/2017  . Subdural hematoma ([place])   . Symptomatic menopausal or female climacteric states   . Vitamin D deficiency     Past Surgical History:  Procedure Laterality Date  . ABDOMINAL HYSTERECTOMY    . BLADDER SUSPENSION    . COLONOSCOPY  multiple  . EYE SURGERY Bilateral 09/2015  . VAGINAL HYSTERECTOMY     total    There were no vitals filed for this visit.  Subjective Assessment - 12/17/17 1312    Subjective  I'm getting better.  My left ankle is sore.    Currently in Pain?  No/denies    Pain Score  3     Pain Type  Acute pain                       [facility] Adult PT Treatment/Exercise - 12/17/17 0001      Exercises   Exercises  Knee/Hip      Lumbar Exercises: Aerobic   Nustep  Level 4 x 17 minutes.      Lumbar Exercises: Standing   Other Standing Lumbar Exercises  Resisted walking forward and back 2 minutes each direction (4 minutes total) against the resistance of the pink XTS band.      Knee/Hip Exercises: Seated   Long Arc Quad Limitations  4# LAQ's x 2 minutes.    Marching  Limitations  4# x 2 minutes f/b red theraband in bilateral hip abdiuction x 2 minutes.          Balance Exercises - 12/17/17 1319      Balance Exercises: Standing   Other Standing Exercises  Rockerboard x 5 minutes.          PT Short Term Goals - 11/28/17 1331      PT SHORT TERM GOAL #1   Title  Independent with a HEP.    Time  4    Period  Weeks    Status  Achieved 11/28/17        PT Long Term Goals - 11/14/17 1339      PT LONG TERM GOAL #1   Title  Improve Berg score to 46-47/56.    Time  6    Period  Weeks    Status  On-going      PT LONG TERM GOAL #2   Title  Independent with a HEP.    Time  6    Period  Weeks    Status  On-going      PT LONG TERM GOAL #3   Title  Bilateral hip strength= 5/5 to increase stability for gait.    Time  6    Period  Weeks    Status  On-going              Patient will benefit from skilled therapeutic intervention in order to improve the following deficits and impairments:     Visit Diagnosis: Unsteadiness on feet  Muscle weakness (generalized)     Problem List Patient Active Problem List   Diagnosis Date Noted  . Pain in both feet 10/03/2017  . SDH (subdural hematoma) ([place]) 08/24/2017  . CVA (cerebral vascular accident) ([place]) 07/29/2017  . Influenza A 12/06/2016  . Sciatica of right side 04/23/2016  . At high risk for falls 04/23/2016  . Increased BMI 09/30/2015  . Hyperlipidemia 09/07/2015  . Low back pain 09/07/2015  . Hypoxia 09/06/2015  . Orthostatic syncope 09/04/2015  . Orthostatic hypotension 09/04/2015  . Aortic atherosclerosis ([place]) 05/03/2015  . Vitamin D deficiency 02/02/2015  . Pre-diabetes 02/02/2015  . Metabolic syndrome 88/32/5498  . Osteoporosis 03/11/2013  . Atrial fibrillation ([place]) 11/24/2012  . Hypokalemia 05/04/2012  . Obesity   . Symptomatic menopausal or female climacteric states   . Kyphosis   . Gallstones   . Personal history of adenomatous colonic polyps   . Essential  hypertension 03/29/2009    [place], [place] MPT 12/17/2017, 1:45 PM  [facility] [address] [place], [place], [address] Phone: [phone]   Fax:  [phone]  Name: Kari [name] MRN: [id number] Date of Birth: 1933/09/21

## 2017-12-18 LAB — CBC WITH DIFFERENTIAL/PLATELET
Basophils Absolute: 0 10*3/uL (ref 0.0–0.2)
Basos: 0 %
EOS (ABSOLUTE): 0.1 10*3/uL (ref 0.0–0.4)
Eos: 2 %
HEMOGLOBIN: 11.6 g/dL (ref 11.1–15.9)
Hematocrit: 38.7 % (ref 34.0–46.6)
Immature Grans (Abs): 0 10*3/uL (ref 0.0–0.1)
Immature Granulocytes: 0 %
LYMPHS ABS: 1.3 10*3/uL (ref 0.7–3.1)
Lymphs: 18 %
MCH: 23.2 pg — AB (ref 26.6–33.0)
MCHC: 30 g/dL — AB (ref 31.5–35.7)
MCV: 77 fL — ABNORMAL LOW (ref 79–97)
MONOCYTES: 6 %
MONOS ABS: 0.5 10*3/uL (ref 0.1–0.9)
NEUTROS ABS: 5.2 10*3/uL (ref 1.4–7.0)
Neutrophils: 74 %
Platelets: 273 10*3/uL (ref 150–379)
RBC: 5 x10E6/uL (ref 3.77–5.28)
RDW: 18.6 % — AB (ref 12.3–15.4)
WBC: 7 10*3/uL (ref 3.4–10.8)

## 2017-12-19 ENCOUNTER — Encounter: Payer: Self-pay | Admitting: Physical Therapy

## 2017-12-19 ENCOUNTER — Ambulatory Visit: Payer: Medicare Other | Admitting: Physical Therapy

## 2017-12-19 DIAGNOSIS — R2681 Unsteadiness on feet: Secondary | ICD-10-CM

## 2017-12-19 DIAGNOSIS — M6281 Muscle weakness (generalized): Secondary | ICD-10-CM | POA: Diagnosis not present

## 2017-12-19 NOTE — Therapy (Addendum)
[facility] [place], [place], [address] Phone: [phone]   Fax:  [phone]  Physical Therapy Treatment  Patient Details  Name: Kari [name] MRN: [id number] Date of Birth: 1934-07-17 Referring Provider: Minus Breeding MD   Encounter Date: 12/19/2017  PT End of Session - 12/19/17 1346    Visit Number  12    Number of Visits  12    Date for PT Re-Evaluation  12/24/17    PT Start Time  0100    PT Stop Time  0145    PT Time Calculation (min)  45 min       Past Medical History:  Diagnosis Date  . Adenomatous polyps   . AF (atrial fibrillation) ([place])   . Cataract   . Gallstones   . Hx of adenomatous colonic polyps   . Hyperlipidemia    x5 years  . Hypertension    x5 years  . Kyphosis   . Metabolic syndrome X   . Obesity   . Osteoporosis   . Stroke ([place]) 07/2017  . Subdural hematoma ([place])   . Symptomatic menopausal or female climacteric states   . Vitamin D deficiency     Past Surgical History:  Procedure Laterality Date  . ABDOMINAL HYSTERECTOMY    . BLADDER SUSPENSION    . COLONOSCOPY  multiple  . EYE SURGERY Bilateral 09/2015  . VAGINAL HYSTERECTOMY     total    There were no vitals filed for this visit.  Subjective Assessment - 12/19/17 1321    Subjective  My left ankle is still sore.    Currently in Pain?  No/denies                       [facility] Adult PT Treatment/Exercise - 12/19/17 0001      Exercises   Exercises  Knee/Hip      Lumbar Exercises: Aerobic   Nustep  Level 5 x 22 minutes.      Knee/Hip Exercises: Standing   Other Standing Knee Exercises  6 inch step-ups x 3 minutes.      Knee/Hip Exercises: Seated   Long Arc Quad Limitations  4# LAQ's x 4 minutes.    Hamstring Limitations  Red theraband 2 minutes each leg (4 minutes total).          Balance Exercises - 12/19/17 1330      Balance Exercises: Standing   Other Standing Exercises  Rockerboard x 5  minutes.          PT Short Term Goals - 11/28/17 1331      PT SHORT TERM GOAL #1   Title  Independent with a HEP.    Time  4    Period  Weeks    Status  Achieved 11/28/17        PT Long Term Goals - 12/19/17 1403      PT LONG TERM GOAL #1   Title  Improve Berg score to 46-47/56.    Time  6    Period  Weeks    Status  On-going      PT LONG TERM GOAL #2   Title  Independent with a HEP.    Time  6    Period  Weeks    Status  Achieved      PT LONG TERM GOAL #3   Title  Bilateral hip strength= 5/5 to increase stability for gait.    Time  6  Period  Weeks    Status  Achieved            Plan - 12/19/17 1403    Clinical Impression Statement  Patient did well with PT meeting 2 of 3 LTG's.  Patient will continue to need a cane at all times for safety.    PT Treatment/Interventions  Functional mobility training;Therapeutic activities;Therapeutic exercise;Balance training;Patient/family education    PT Next Visit Plan  Continue step overs. Bilateral LE strengthening; gait and balance training.    Consulted and Agree with Plan of Care  Patient       Patient will benefit from skilled therapeutic intervention in order to improve the following deficits and impairments:  Abnormal gait, Decreased mobility, Decreased strength, Decreased balance  Visit Diagnosis: Unsteadiness on feet  Muscle weakness (generalized)     Problem List Patient Active Problem List   Diagnosis Date Noted  . Pain in both feet 10/03/2017  . SDH (subdural hematoma) ([place]) 08/24/2017  . CVA (cerebral vascular accident) ([place]) 07/29/2017  . Influenza A 12/06/2016  . Sciatica of right side 04/23/2016  . At high risk for falls 04/23/2016  . Increased BMI 09/30/2015  . Hyperlipidemia 09/07/2015  . Low back pain 09/07/2015  . Hypoxia 09/06/2015  . Orthostatic syncope 09/04/2015  . Orthostatic hypotension 09/04/2015  . Aortic atherosclerosis ([place]) 05/03/2015  . Vitamin D deficiency 02/02/2015   . Pre-diabetes 02/02/2015  . Metabolic syndrome 42/35/3614  . Osteoporosis 03/11/2013  . Atrial fibrillation ([place]) 11/24/2012  . Hypokalemia 05/04/2012  . Obesity   . Symptomatic menopausal or female climacteric states   . Kyphosis   . Gallstones   . Personal history of adenomatous colonic polyps   . Essential hypertension 03/29/2009   PHYSICAL THERAPY DISCHARGE SUMMARY  Visits from Start of Care: 12.  Current functional level related to goals / functional outcomes: See above.   Remaining deficits: Goals #2 and 3 met.   Education / Equipment: HEP. Plan: Patient agrees to discharge.  Patient goals were partially met. Patient is being discharged due to being pleased with the current functional level.  ?????      Anaaya Fuster, [place] MPT 12/19/2017, 2:05 PM  [facility] [address] [place], [place], [address] Phone: [phone]   Fax:  [phone]  Name: Kari [name] MRN: [id number] Date of Birth: [birth year]

## 2017-12-23 ENCOUNTER — Other Ambulatory Visit: Payer: Self-pay | Admitting: Family Medicine

## 2017-12-31 ENCOUNTER — Ambulatory Visit (INDEPENDENT_AMBULATORY_CARE_PROVIDER_SITE_OTHER): Payer: Medicare Other | Admitting: Family Medicine

## 2017-12-31 ENCOUNTER — Ambulatory Visit (INDEPENDENT_AMBULATORY_CARE_PROVIDER_SITE_OTHER): Payer: Medicare Other

## 2017-12-31 ENCOUNTER — Encounter: Payer: Self-pay | Admitting: Family Medicine

## 2017-12-31 ENCOUNTER — Other Ambulatory Visit: Payer: Self-pay | Admitting: Family Medicine

## 2017-12-31 VITALS — BP 154/81 | HR 92 | Temp 97.1°F | Ht 61.5 in | Wt 154.0 lb

## 2017-12-31 DIAGNOSIS — R197 Diarrhea, unspecified: Secondary | ICD-10-CM

## 2017-12-31 DIAGNOSIS — I6930 Unspecified sequelae of cerebral infarction: Secondary | ICD-10-CM | POA: Diagnosis not present

## 2017-12-31 DIAGNOSIS — R638 Other symptoms and signs concerning food and fluid intake: Secondary | ICD-10-CM | POA: Diagnosis not present

## 2017-12-31 MED ORDER — DIPHENOXYLATE-ATROPINE 2.5-0.025 MG PO TABS: 1.0000 | ORAL_TABLET | Freq: Four times a day (QID) | ORAL | 0 refills | Status: DC | PRN

## 2017-12-31 NOTE — Progress Notes (Signed)
Subjective:  Patient ID: Kari [name], female    DOB: 1934-03-28  Age: 84 y.o. MRN: [id number]  CC: Diarrhea (pt here today c/o "liquid BM over the last week and can't make it to the bathroom")   HPI Kari [name] presents for 1 week of watery bowel movements with fecal incontinence.  She is having 1-2 a day.  There is no associated pain or tenesmus that he come without warning.  She has perhaps a few seconds of sensation prior to defecating.  She will have a full watery bowel movement wherever she might be and then she will be fine.  She has been on multiple antibiotics in the recent past but none in the last couple of weeks.  She denies abdominal pain.  There has been no nausea or vomiting.  She has had no focal neurologic changes.  Stool has not had unusual odor.  No upper respiratory symptoms.  There has been no blood noted in the stool.  Depression screen [facility] 2/9 12/31/2017 10/29/2017 09/28/2017  Decreased Interest 0 0 0  Down, Depressed, Hopeless 0 0 0  PHQ - 2 Score 0 0 0    History Kari [name] has a past medical history of Adenomatous polyps, AF (atrial fibrillation) ([place]), Cataract, Gallstones, adenomatous colonic polyps, Hyperlipidemia, Hypertension, Kyphosis, Metabolic syndrome X, Obesity, Osteoporosis, Stroke ([place]) (07/2017), Subdural hematoma ([place]), Symptomatic menopausal or female climacteric states, and Vitamin D deficiency.   She has a past surgical history that includes Vaginal hysterectomy; Bladder suspension; Colonoscopy (multiple); Eye surgery (Bilateral, 09/2015); and Abdominal hysterectomy.   Her family history includes Alcohol abuse in her father; Breast cancer in her other; Depression in her brother; Heart attack in her brother, father, mother, and other; Hypertension in her brother, brother, sister, and sister; Stroke in her brother, mother, other, and sister.She reports that she has never smoked. She has never used smokeless tobacco. She reports that she does not drink  alcohol or use drugs.    ROS Review of Systems  Constitutional: Negative.   HENT: Negative for congestion.   Eyes: Negative for visual disturbance.  Respiratory: Negative for shortness of breath.   Cardiovascular: Negative for chest pain.  Gastrointestinal: Positive for diarrhea. Negative for abdominal pain, blood in stool, constipation, nausea and vomiting.  Genitourinary: Negative for difficulty urinating.  Musculoskeletal: Negative for arthralgias and myalgias.  Neurological: Negative for headaches.  Psychiatric/Behavioral: Negative for sleep disturbance.    Objective:  BP (!) 154/81   Pulse 92   Temp (!) 97.1 F (36.2 C) (Oral)   Ht 5' 1.5" (1.562 m)   Wt 154 lb (69.9 kg)   BMI 28.63 kg/m   BP Readings from Last 3 Encounters:  12/31/17 (!) 154/81  11/12/17 132/86  11/06/17 110/80    Wt Readings from Last 3 Encounters:  12/31/17 154 lb (69.9 kg)  11/06/17 152 lb (68.9 kg)  11/05/17 153 lb (69.4 kg)     Physical Exam  Constitutional: She is oriented to person, place, and time. She appears well-developed and well-nourished. No distress.  HENT:  Head: Normocephalic and atraumatic.  Eyes: Pupils are equal, round, and reactive to light. Conjunctivae are normal.  Neck: Normal range of motion. Neck supple. No thyromegaly present.  Cardiovascular: Normal rate, regular rhythm and normal heart sounds.  No murmur heard. Pulmonary/Chest: Effort normal and breath sounds normal. No respiratory distress. She has no wheezes. She has no rales.  Abdominal: Soft. Bowel sounds are normal. She exhibits no distension. There is no tenderness.  Musculoskeletal: Normal range of motion.  Lymphadenopathy:    She has no cervical adenopathy.  Neurological: She is alert and oriented to person, place, and time.  Skin: Skin is warm and dry.  Psychiatric: She has a normal mood and affect. Her behavior is normal. Judgment and thought content normal.      Assessment & Plan:   Kari [name]  was seen today for diarrhea.  Diagnoses and all orders for this visit:  Diarrhea, unspecified type -     CBC with Differential/Platelet -     CMP14+EGFR -     Amylase -     Lipase -     Stool culture -     DG Abd 2 Views; Future  Other orders -     diphenoxylate-atropine (LOMOTIL) 2.5-0.025 MG tablet; Take 1 tablet by mouth 4 (four) times daily as needed for diarrhea or loose stools.       I am having Kari [name] start on diphenoxylate-atropine. I am also having her maintain her potassium chloride, Melatonin, Vitamin D (Ergocalciferol), ezetimibe, atenolol, pravastatin, Benfotiamine, ELIQUIS, potassium chloride, and captopril.  Allergies as of 12/31/2017      Reactions   Erythromycin Swelling, Other (See Comments)   Reaction unknown   Penicillins Other (See Comments)   Reaction unknown  Has patient had a PCN reaction causing immediate rash, facial/tongue/throat swelling, SOB or lightheadedness with hypotension: unknown Has patient had a PCN reaction causing severe rash involving mucus membranes or skin necrosis: unknown Has patient had a PCN reaction that required hospitalization : unknown Has patient had a PCN reaction occurring within the last 10 years: no If all of the above answers are "NO", then may proceed with Cephalosporin use.      Medication List        Accurate as of 12/31/17 10:32 AM. Always use your most recent med list.          atenolol 50 MG tablet Commonly known as:  TENORMIN Take 1 tablet (50 mg total) by mouth daily.   Benfotiamine 150 MG Caps Take 150 mg by mouth daily.   captopril 50 MG tablet Commonly known as:  CAPOTEN TAKE (1) TABLET TWICE A DAY FOR HIGH BLOOD PRESSURE.   diphenoxylate-atropine 2.5-0.025 MG tablet Commonly known as:  LOMOTIL Take 1 tablet by mouth 4 (four) times daily as needed for diarrhea or loose stools.   ELIQUIS 5 MG Tabs tablet Generic drug:  apixaban Take 1 tablet (5 mg total) by mouth 2 (two) times daily.    ezetimibe 10 MG tablet Commonly known as:  ZETIA TAKE 1 TABLET ONCE DAILY FOR CHOLESTEROL   Melatonin 3 MG Tabs Take 3 mg by mouth at bedtime.   potassium chloride 10 MEQ tablet Commonly known as:  K-DUR TAKE 1 TABLET DAILY   potassium chloride 10 MEQ tablet Commonly known as:  K-DUR TAKE 1 TABLET DAILY   pravastatin 40 MG tablet Commonly known as:  PRAVACHOL Take 1 tablet (40 mg total) by mouth daily.   Vitamin D (Ergocalciferol) 50000 units Caps capsule Commonly known as:  DRISDOL TAKE 1 CAPSULE ONCE A WEEK        Follow-up: Return in about 1 week (around 01/07/2018).  Claretta Fraise, M.D.

## 2018-01-01 LAB — CMP14+EGFR
A/G RATIO: 1.3 (ref 1.2–2.2)
ALBUMIN: 4.1 g/dL (ref 3.5–4.7)
ALT: 20 IU/L (ref 0–32)
AST: 23 IU/L (ref 0–40)
Alkaline Phosphatase: 73 IU/L (ref 39–117)
BUN / CREAT RATIO: 10 — AB (ref 12–28)
BUN: 7 mg/dL — ABNORMAL LOW (ref 8–27)
Bilirubin Total: 0.4 mg/dL (ref 0.0–1.2)
CALCIUM: 9.6 mg/dL (ref 8.7–10.3)
CO2: 24 mmol/L (ref 20–29)
Chloride: 101 mmol/L (ref 96–106)
Creatinine, Ser: 0.71 mg/dL (ref 0.57–1.00)
GFR, EST AFRICAN AMERICAN: 92 mL/min/{1.73_m2} (ref >59)
GFR, EST NON AFRICAN AMERICAN: 80 mL/min/{1.73_m2} (ref >59)
GLOBULIN, TOTAL: 3.2 g/dL (ref 1.5–4.5)
Glucose: 125 mg/dL — ABNORMAL HIGH (ref 65–99)
POTASSIUM: 3.5 mmol/L (ref 3.5–5.2)
Sodium: 139 mmol/L (ref 134–144)
TOTAL PROTEIN: 7.3 g/dL (ref 6.0–8.5)

## 2018-01-01 LAB — CBC WITH DIFFERENTIAL/PLATELET
BASOS: 0 %
Basophils Absolute: 0 10*3/uL (ref 0.0–0.2)
EOS (ABSOLUTE): 0.2 10*3/uL (ref 0.0–0.4)
EOS: 2 %
HEMATOCRIT: 39.3 % (ref 34.0–46.6)
HEMOGLOBIN: 12 g/dL (ref 11.1–15.9)
IMMATURE GRANS (ABS): 0 10*3/uL (ref 0.0–0.1)
IMMATURE GRANULOCYTES: 0 %
LYMPHS: 13 %
Lymphocytes Absolute: 1.2 10*3/uL (ref 0.7–3.1)
MCH: 23.7 pg — AB (ref 26.6–33.0)
MCHC: 30.5 g/dL — ABNORMAL LOW (ref 31.5–35.7)
MCV: 78 fL — AB (ref 79–97)
MONOCYTES: 8 %
Monocytes Absolute: 0.7 10*3/uL (ref 0.1–0.9)
NEUTROS ABS: 6.7 10*3/uL (ref 1.4–7.0)
NEUTROS PCT: 77 %
PLATELETS: 278 10*3/uL (ref 150–379)
RBC: 5.06 x10E6/uL (ref 3.77–5.28)
RDW: 18.7 % — ABNORMAL HIGH (ref 12.3–15.4)
WBC: 8.7 10*3/uL (ref 3.4–10.8)

## 2018-01-01 LAB — AMYLASE: Amylase: 42 U/L (ref 31–124)

## 2018-01-01 LAB — LIPASE: Lipase: 26 U/L (ref 14–85)

## 2018-01-02 ENCOUNTER — Other Ambulatory Visit: Payer: Medicare Other

## 2018-01-02 DIAGNOSIS — R197 Diarrhea, unspecified: Secondary | ICD-10-CM | POA: Diagnosis not present

## 2018-01-04 LAB — TRANSFERRIN: Transferrin: 341 mg/dL (ref 200–370)

## 2018-01-04 LAB — FERRITIN: FERRITIN: 15 ng/mL (ref 15–150)

## 2018-01-04 LAB — IRON AND TIBC
IRON SATURATION: 7 % — AB (ref 15–55)
Iron: 27 ug/dL (ref 27–139)
TIBC: 388 ug/dL (ref 250–450)
UIBC: 361 ug/dL (ref 118–369)

## 2018-01-04 LAB — SPECIMEN STATUS REPORT

## 2018-01-06 ENCOUNTER — Other Ambulatory Visit: Payer: Self-pay | Admitting: Family Medicine

## 2018-01-07 ENCOUNTER — Encounter: Payer: Self-pay | Admitting: Family Medicine

## 2018-01-07 ENCOUNTER — Ambulatory Visit (INDEPENDENT_AMBULATORY_CARE_PROVIDER_SITE_OTHER): Payer: Medicare Other | Admitting: Family Medicine

## 2018-01-07 VITALS — BP 170/76 | HR 78 | Temp 97.2°F | Ht 61.5 in | Wt 155.1 lb

## 2018-01-07 DIAGNOSIS — R197 Diarrhea, unspecified: Secondary | ICD-10-CM

## 2018-01-07 DIAGNOSIS — I480 Paroxysmal atrial fibrillation: Secondary | ICD-10-CM | POA: Diagnosis not present

## 2018-01-07 DIAGNOSIS — L84 Corns and callosities: Secondary | ICD-10-CM | POA: Diagnosis not present

## 2018-01-07 DIAGNOSIS — B351 Tinea unguium: Secondary | ICD-10-CM | POA: Diagnosis not present

## 2018-01-07 DIAGNOSIS — I10 Essential (primary) hypertension: Secondary | ICD-10-CM | POA: Diagnosis not present

## 2018-01-07 DIAGNOSIS — M79676 Pain in unspecified toe(s): Secondary | ICD-10-CM | POA: Diagnosis not present

## 2018-01-07 DIAGNOSIS — I6340 Cerebral infarction due to embolism of unspecified cerebral artery: Secondary | ICD-10-CM

## 2018-01-07 DIAGNOSIS — I70203 Unspecified atherosclerosis of native arteries of extremities, bilateral legs: Secondary | ICD-10-CM | POA: Diagnosis not present

## 2018-01-07 LAB — STOOL CULTURE: E coli, Shiga toxin Assay: NEGATIVE

## 2018-01-07 NOTE — Progress Notes (Signed)
Subjective:  Patient ID: Kari [name], female    DOB: 1934/08/16  Age: 84 y.o. MRN: [id number]  CC: Follow-up (pt here today for a 1 week follow up)   HPI ANGELENA SAND presents for recheck of her diarrhea.  She continues to have diarrhea if she does not take her diphenoxylate twice daily it is a liquid bowel movement once to twice a day.  It is not associated with any pain.  Her amylase and lipase were normal as were her liver and kidney functions.  She has no Shigella or Salmonella but her Campylobacter test has not come back yet.  She was noted to have a low iron on testing but her blood counts are otherwise normal.  Upon medication review she is taking for medications that have potential adverse reactions with diarrhea these include atenolol, pravastatin, Zetia and melatonin.  She is in atrial fibrillation and hypertension patient.  She has elevated cholesterol.  And she has difficulty sleeping.  Those are the reasons for those medications.  None of the problems have flared recently with the exception of the elevated blood pressure noted below today last visit.   Patient in for follow-up of atrial fibrillation. Patient denies any recent bouts of chest pain or palpitations. Additionally, patient is taking anticoagulants. Patient denies any recent excessive bleeding episodes including epistaxis, bleeding from the gums, genitalia, rectal bleeding or hematuria. Additionally there has been no excessive bruising.  Depression screen [facility] 2/9 01/07/2018 12/31/2017 10/29/2017  Decreased Interest 0 0 0  Down, Depressed, Hopeless 0 0 0  PHQ - 2 Score 0 0 0    History Kahmya has a past medical history of Adenomatous polyps, AF (atrial fibrillation) ([place]), Cataract, Gallstones, adenomatous colonic polyps, Hyperlipidemia, Hypertension, Kyphosis, Metabolic syndrome X, Obesity, Osteoporosis, Stroke ([place]) (07/2017), Subdural hematoma ([place]), Symptomatic menopausal or female climacteric states, and Vitamin D  deficiency.   She has a past surgical history that includes Vaginal hysterectomy; Bladder suspension; Colonoscopy (multiple); Eye surgery (Bilateral, 09/2015); and Abdominal hysterectomy.   Her family history includes Alcohol abuse in her father; Breast cancer in her other; Depression in her brother; Heart attack in her brother, father, mother, and other; Hypertension in her brother, brother, sister, and sister; Stroke in her brother, mother, other, and sister.She reports that she has never smoked. She has never used smokeless tobacco. She reports that she does not drink alcohol or use drugs.    ROS Review of Systems  Constitutional: Negative for activity change and appetite change.  HENT: Negative.   Respiratory: Negative for chest tightness and shortness of breath.   Cardiovascular: Negative for chest pain.  Gastrointestinal: Positive for diarrhea. Negative for abdominal distention, abdominal pain, blood in stool, nausea and rectal pain.  Genitourinary: Negative for difficulty urinating, dysuria, frequency and vaginal bleeding.  Psychiatric/Behavioral: Negative.     Objective:  BP (!) 170/76   Pulse 78   Temp (!) 97.2 F (36.2 C) (Oral)   Ht 5' 1.5" (1.562 m)   Wt 155 lb 2 oz (70.4 kg)   BMI 28.84 kg/m   BP Readings from Last 3 Encounters:  01/07/18 (!) 170/76  12/31/17 (!) 154/81  11/12/17 132/86    Wt Readings from Last 3 Encounters:  01/07/18 155 lb 2 oz (70.4 kg)  12/31/17 154 lb (69.9 kg)  11/06/17 152 lb (68.9 kg)     Physical Exam  Constitutional: She is oriented to person, place, and time. She appears well-developed and well-nourished.  HENT:  Head: Normocephalic  and atraumatic.  Cardiovascular: Normal rate and regular rhythm.  No murmur heard. Pulmonary/Chest: Effort normal and breath sounds normal.  Abdominal: Soft. She exhibits no mass. There is no tenderness. There is no rebound and no guarding.  Neurological: She is alert and oriented to person, place,  and time.  Skin: Skin is warm and dry.  Psychiatric: She has a normal mood and affect. Her behavior is normal.      Assessment & Plan:   Cherity was seen today for follow-up.  Diagnoses and all orders for this visit:  Diarrhea, unspecified type  Paroxysmal atrial fibrillation ([place])  Essential hypertension       I have discontinued Gabriel Earing. Mincy's Melatonin, ezetimibe, and pravastatin. I am also having her maintain her potassium chloride, atenolol, Benfotiamine, ELIQUIS, captopril, Vitamin D (Ergocalciferol), and diphenoxylate-atropine.  Allergies as of 01/07/2018      Reactions   Erythromycin Swelling, Other (See Comments)   Reaction unknown   Penicillins Other (See Comments)   Reaction unknown  Has patient had a PCN reaction causing immediate rash, facial/tongue/throat swelling, SOB or lightheadedness with hypotension: unknown Has patient had a PCN reaction causing severe rash involving mucus membranes or skin necrosis: unknown Has patient had a PCN reaction that required hospitalization : unknown Has patient had a PCN reaction occurring within the last 10 years: no If all of the above answers are "NO", then may proceed with Cephalosporin use.      Medication List        Accurate as of 01/07/18 10:00 AM. Always use your most recent med list.          atenolol 50 MG tablet Commonly known as:  TENORMIN Take 1 tablet (50 mg total) by mouth daily.   Benfotiamine 150 MG Caps Take 150 mg by mouth daily.   captopril 50 MG tablet Commonly known as:  CAPOTEN TAKE (1) TABLET TWICE A DAY FOR HIGH BLOOD PRESSURE.   diphenoxylate-atropine 2.5-0.025 MG tablet Commonly known as:  LOMOTIL Take 1 tablet by mouth 4 (four) times daily as needed for diarrhea or loose stools.   ELIQUIS 5 MG Tabs tablet Generic drug:  apixaban Take 1 tablet (5 mg total) by mouth 2 (two) times daily.   potassium chloride 10 MEQ tablet Commonly known as:  K-DUR TAKE 1 TABLET DAILY     Vitamin D (Ergocalciferol) 50000 units Caps capsule Commonly known as:  DRISDOL TAKE 1 CAPSULE ONCE A WEEK      Although atenolol has the potential for causing diarrhea, I believe it is far more essential to her care than the 3 medicines that have been discontinued.  She is going to stay off those 3 medicines for the next 3 weeks and follow-up.  Hopefully her diarrhea will been resolved.  At the end of that time if this is been resolved her diphenoxylate will be DC'd and she will be rechallenged first with the Pravachol then with the melatonin, however since that here is the most likely medication source of her diarrhea, I may not rechallenge her with that.  Should her diarrhea did not have resolved at the end of that time I will consider further testing.  Meanwhile since she has the iron deficiency she is going to need to see GI anyway and will let them look into the as well as the iron deficiency with regard to possible GI blood loss. Follow-up: Return in about 3 weeks (around 01/28/2018).  Claretta Fraise, M.D.

## 2018-01-08 ENCOUNTER — Ambulatory Visit: Payer: Medicare Other | Admitting: Family Medicine

## 2018-01-20 ENCOUNTER — Encounter: Payer: Self-pay | Admitting: Family Medicine

## 2018-01-20 ENCOUNTER — Ambulatory Visit (INDEPENDENT_AMBULATORY_CARE_PROVIDER_SITE_OTHER): Payer: Medicare Other | Admitting: Family Medicine

## 2018-01-20 VITALS — BP 143/81 | HR 84 | Temp 97.5°F | Ht 61.5 in | Wt 154.0 lb

## 2018-01-20 DIAGNOSIS — I481 Persistent atrial fibrillation: Secondary | ICD-10-CM | POA: Diagnosis not present

## 2018-01-20 DIAGNOSIS — I700 Atherosclerosis of aorta: Secondary | ICD-10-CM

## 2018-01-20 DIAGNOSIS — N390 Urinary tract infection, site not specified: Secondary | ICD-10-CM

## 2018-01-20 DIAGNOSIS — I639 Cerebral infarction, unspecified: Secondary | ICD-10-CM

## 2018-01-20 DIAGNOSIS — I4819 Other persistent atrial fibrillation: Secondary | ICD-10-CM

## 2018-01-20 DIAGNOSIS — I10 Essential (primary) hypertension: Secondary | ICD-10-CM

## 2018-01-20 DIAGNOSIS — I6930 Unspecified sequelae of cerebral infarction: Secondary | ICD-10-CM | POA: Diagnosis not present

## 2018-01-20 DIAGNOSIS — R1310 Dysphagia, unspecified: Secondary | ICD-10-CM

## 2018-01-20 NOTE — Progress Notes (Signed)
Subjective:    Patient ID: Kari [name], female    DOB: 05-15-1934, 84 y.o.   MRN: [id number]  HPI Patient here today for 3 week follow up on diarrhea. She states that it is much better. She does complain today of cough.  The patient comes with her family member to the visit today.  She had loose stools at the last visit and this is much better.  She still has a history of atrial fibrillation and hypertension.  She has some cough which is dry.  Her vital signs are stable.  She is not running any fever.  She is also had some problems in the past with recurrent urinary tract infections.  She has had a stroke.  This was in November 2018.  Patient's mother had history of stroke and heart disease.  The patient's father also had heart disease.  The patient does come with her daughter today and the daughter says she is drinking 6 bottles of water a day and since she is been doing that she has not had any urinary tract infections.  She is still having problems with swallowing and chokes occasionally.  We will do a referral to the gastroenterologist to evaluate this.  She denies any chest pain or increased shortness of breath.  Other than the swallowing issues she has no problems with nausea vomiting blood in the stool or black tarry bowel movements.  Her stools are now formed and there was some feeling that the cholesterol medicine could have played a role with loose bowel movements even though I am never seen that with other patients but anything is possible.  She is passing her water well.  She seems to be more like her old self here today.    Patient Active Problem List   Diagnosis Date Noted  . Pain in both feet 10/03/2017  . SDH (subdural hematoma) ([place]) 08/24/2017  . CVA (cerebral vascular accident) ([place]) 07/29/2017  . Influenza A 12/06/2016  . Sciatica of right side 04/23/2016  . At high risk for falls 04/23/2016  . Increased BMI 09/30/2015  . Hyperlipidemia 09/07/2015  . Low back pain  09/07/2015  . Hypoxia 09/06/2015  . Orthostatic syncope 09/04/2015  . Orthostatic hypotension 09/04/2015  . Aortic atherosclerosis ([place]) 05/03/2015  . Vitamin D deficiency 02/02/2015  . Pre-diabetes 02/02/2015  . Metabolic syndrome 09/47/0962  . Osteoporosis 03/11/2013  . Atrial fibrillation ([place]) 11/24/2012  . Hypokalemia 05/04/2012  . Obesity   . Symptomatic menopausal or female climacteric states   . Kyphosis   . Gallstones   . Personal history of adenomatous colonic polyps   . Essential hypertension 03/29/2009   Outpatient Encounter Medications as of 01/20/2018  Medication Sig  . atenolol (TENORMIN) 50 MG tablet Take 1 tablet (50 mg total) by mouth daily.  . Benfotiamine 150 MG CAPS Take 150 mg by mouth daily.  . captopril (CAPOTEN) 50 MG tablet TAKE (1) TABLET TWICE A DAY FOR HIGH BLOOD PRESSURE.  Marland Kitchen ELIQUIS 5 MG TABS tablet Take 1 tablet (5 mg total) by mouth 2 (two) times daily.  . potassium chloride (K-DUR) 10 MEQ tablet TAKE 1 TABLET DAILY  . Vitamin D, Ergocalciferol, (DRISDOL) 50000 units CAPS capsule TAKE 1 CAPSULE ONCE A WEEK  . [DISCONTINUED] diphenoxylate-atropine (LOMOTIL) 2.5-0.025 MG tablet Take 1 tablet by mouth 4 (four) times daily as needed for diarrhea or loose stools.   No facility-administered encounter medications on file as of 01/20/2018.       Review of  Systems  Constitutional: Negative.   HENT: Negative.   Eyes: Negative.   Respiratory: Positive for cough (dry ).   Cardiovascular: Negative.   Gastrointestinal: Negative.   Endocrine: Negative.   Genitourinary: Negative.   Musculoskeletal: Negative.   Skin: Negative.   Allergic/Immunologic: Negative.   Neurological: Negative.   Hematological: Negative.   Psychiatric/Behavioral: Negative.        Objective:   Physical Exam  Constitutional: She is oriented to person, place, and time. She appears well-developed and well-nourished. No distress.  The patient is alert and looks good for her age and  more like she has been in years past.  The daughter confirms that she is feeling better.  HENT:  Head: Normocephalic and atraumatic.  Right Ear: External ear normal.  Left Ear: External ear normal.  Nose: Nose normal.  Mouth/Throat: Oropharynx is clear and moist. No oropharyngeal exudate.  Eyes: Pupils are equal, round, and reactive to light. Conjunctivae and EOM are normal. Right eye exhibits no discharge. Left eye exhibits no discharge.  Neck: Normal range of motion. Neck supple. No thyromegaly present.  No bruits thyromegaly or anterior cervical adenopathy  Cardiovascular: Normal rate and normal heart sounds.  No murmur heard. Heart is irregular irregular at 84/min  Pulmonary/Chest: Effort normal and breath sounds normal. She has no wheezes. She has no rales.  Clear anteriorly and posteriorly  Abdominal: Soft. Bowel sounds are normal. She exhibits no mass. There is no tenderness.  Musculoskeletal: Normal range of motion. She exhibits no edema.  Patient uses a cane for ambulation.  Moves all extremities well and there was no sign of any swelling.  Lymphadenopathy:    She has no cervical adenopathy.  Neurological: She is alert and oriented to person, place, and time. She has normal reflexes. She exhibits normal muscle tone.  Skin: Skin is warm and dry. No rash noted.  Psychiatric: She has a normal mood and affect. Her behavior is normal. Judgment and thought content normal.  Mood affect and behavior were normal.  Nursing note and vitals reviewed.    BP (!) 143/81 (BP Location: Left Arm)   Pulse 84   Temp (!) 97.5 F (36.4 C) (Oral)   Ht 5' 1.5" (1.562 m)   Wt 154 lb (69.9 kg)   BMI 28.63 kg/m        Assessment & Plan:  1. Dysphagia, unspecified type -This is been going on for about 6 months according to the daughter.  We will arrange this see the gastroenterologist for further evaluation and management. - Ambulatory referral to Gastroenterology  2. Cerebrovascular  accident (CVA), unspecified mechanism ([place]) -The patient seems to be recovering well from this with no specific findings today being noted.  3. Frequent urinary tract infections -She has no complaints with this today.  She has been doing better with this after drinking more fluids  4. Thoracic aortic atherosclerosis ([place]) -Patient has stopped the cholesterol medicines and the loose stools are better I am not sure of the coincidence of this but we will not restart them and wait until the lab work comes back to make further decisions about her cholesterol.  5. Essential hypertension -The systolic blood pressure slightly elevated today.  No change in treatment.  6. Persistent atrial fibrillation (HCC) -Continue to follow-up with cardiology as planned   No orders of the defined types were placed in this encounter.  The patient will take Mucinex for cough and congestion and keep drinking plenty of water and fluids  Patient  Instructions  We will arrange for you to have an appointment with the gastroenterologist because of trouble swallowing Continue to drink plenty of water and stay well-hydrated Stay active physically do not put yourself at risk for falling  Arrie Senate MD

## 2018-01-20 NOTE — Patient Instructions (Signed)
We will arrange for you to have an appointment with the gastroenterologist because of trouble swallowing Continue to drink plenty of water and stay well-hydrated Stay active physically do not put yourself at risk for falling

## 2018-01-29 ENCOUNTER — Ambulatory Visit: Payer: Medicare Other | Admitting: Family Medicine

## 2018-01-31 ENCOUNTER — Ambulatory Visit (INDEPENDENT_AMBULATORY_CARE_PROVIDER_SITE_OTHER): Payer: Medicare Other | Admitting: Internal Medicine

## 2018-01-31 ENCOUNTER — Encounter (INDEPENDENT_AMBULATORY_CARE_PROVIDER_SITE_OTHER): Payer: Self-pay | Admitting: *Deleted

## 2018-01-31 ENCOUNTER — Encounter (INDEPENDENT_AMBULATORY_CARE_PROVIDER_SITE_OTHER): Payer: Self-pay | Admitting: Internal Medicine

## 2018-01-31 ENCOUNTER — Telehealth (INDEPENDENT_AMBULATORY_CARE_PROVIDER_SITE_OTHER): Payer: Self-pay | Admitting: *Deleted

## 2018-01-31 VITALS — BP 162/80 | HR 68 | Temp 98.0°F | Ht 61.5 in | Wt 148.8 lb

## 2018-01-31 DIAGNOSIS — I6340 Cerebral infarction due to embolism of unspecified cerebral artery: Secondary | ICD-10-CM | POA: Diagnosis not present

## 2018-01-31 DIAGNOSIS — R1310 Dysphagia, unspecified: Secondary | ICD-10-CM | POA: Insufficient documentation

## 2018-01-31 DIAGNOSIS — R1319 Other dysphagia: Secondary | ICD-10-CM

## 2018-01-31 NOTE — Telephone Encounter (Signed)
Patient is scheduled for endoscopy with dilatation on 03/13/18 -- she needs to stop Eliquis 2 days prior -- please advise if ok

## 2018-01-31 NOTE — Telephone Encounter (Signed)
Pt daughter aware

## 2018-01-31 NOTE — Patient Instructions (Signed)
The risks of bleeding, perforation and infection were reviewed with patient.  

## 2018-01-31 NOTE — Telephone Encounter (Signed)
It is okay to stop the Eliquis 2 days prior to the procedure.  Please call the patient's daughter with this information

## 2018-01-31 NOTE — Progress Notes (Signed)
Subjective:    Patient ID: Kari [name], female    DOB: [birth year], 84 y.o.   MRN: [id number]  HPIReferred by Dr.Donald Laurance Flatten for dysphagia. Underwent a swallow test in March (see below).  She vomits when she eats. She is choking when she eats. Symptoms even before her CVA. Foods are lodging in her esophagus. Kari [name], hush puppies, corn bread may lodge. She has to vomit.  Son states she took a Mucinex pill and ha a hard time swallowing it.  She does not eat beef. Eats very little meat.  Symptoms x once a week.    11/11/2017 Swallow function:  Hx of atrial fib and CVA and maintained on Eliquis CVA last year.  Assessment / Plan / Recommendation  CHL IP CLINICAL IMPRESSIONS 11/11/2017  Clinical Impression Pt's oropharyngeal swallow is within functional limits given age. She had one instance of trace, shallow penetration that cleared spontaneously upon completion of the swallow. Suspect a primary esophageal component to her issues. Her more proximal esophagus was prominent (but did not impede bolus flow) and he seemed to have incomplete clearance and retrograde flow of barium more distally. Would consider additional esophageal w/u if symptoms persist. For now, would continue with unrestricted diet textures as tolerated, but did provide pt/family with handout of esophageal precautions.  SLP Visit Diagnosis Dysphagia, unspecified (R13.10)  Attention and concentration deficit following --  Frontal lobe and executive function deficit following --  Impact on safety and function No limitations         Review of Systems Past Medical History:  Diagnosis Date  . Adenomatous polyps   . AF (atrial fibrillation) ([place])   . Cataract   . Gallstones   . Hx of adenomatous colonic polyps   . Hyperlipidemia    x5 years  . Hypertension    x5 years  . Kyphosis   . Metabolic syndrome X   . Obesity   . Osteoporosis   . Stroke ([place]) 07/2017  . Subdural hematoma ([place])   . Symptomatic menopausal or  female climacteric states   . Vitamin D deficiency     Past Surgical History:  Procedure Laterality Date  . ABDOMINAL HYSTERECTOMY    . BLADDER SUSPENSION    . COLONOSCOPY  multiple  . EYE SURGERY Bilateral 09/2015  . VAGINAL HYSTERECTOMY     total    Allergies  Allergen Reactions  . Erythromycin Swelling and Other (See Comments)    Reaction unknown  . Penicillins Other (See Comments)    Reaction unknown  Has patient had a PCN reaction causing immediate rash, facial/tongue/throat swelling, SOB or lightheadedness with hypotension: unknown Has patient had a PCN reaction causing severe rash involving mucus membranes or skin necrosis: unknown Has patient had a PCN reaction that required hospitalization : unknown Has patient had a PCN reaction occurring within the last 10 years: no If all of the above answers are "NO", then may proceed with Cephalosporin use.     Current Outpatient Medications on File Prior to Visit  Medication Sig Dispense Refill  . atenolol (TENORMIN) 50 MG tablet Take 1 tablet (50 mg total) by mouth daily. 90 tablet 3  . Benfotiamine 150 MG CAPS Take 150 mg by mouth daily.    . captopril (CAPOTEN) 50 MG tablet TAKE (1) TABLET TWICE A DAY FOR HIGH BLOOD PRESSURE. 60 tablet 5  . ELIQUIS 5 MG TABS tablet Take 1 tablet (5 mg total) by mouth 2 (two) times daily. 60 tablet 2  . potassium  chloride (K-DUR) 10 MEQ tablet TAKE 1 TABLET DAILY 30 tablet 5  . Vitamin D, Ergocalciferol, (DRISDOL) 50000 units CAPS capsule TAKE 1 CAPSULE ONCE A WEEK 12 capsule 0   No current facility-administered medications on file prior to visit.         Objective:   Physical Exam Blood pressure (!) 162/80, pulse 68, temperature 98 F (36.7 C), height 5' 1.5" (1.562 m), weight 148 lb 12.8 oz (67.5 kg). Alert and oriented. Skin warm and dry. Oral mucosa is moist.   . Sclera anicteric, conjunctivae is pink. Thyroid not enlarged. No cervical lymphadenopathy. Lungs clear. Heart regular rate  and rhythm.  Abdomen is soft. Bowel sounds are positive. No hepatomegaly. No abdominal masses felt. No tenderness.  No edema to lower extremities.           Assessment & Plan:  Dysphagia. Will proceed with an EGD/ED. ? May have a motility problem.

## 2018-01-31 NOTE — Telephone Encounter (Signed)
Please call patient's daughter and let her know it is okay to stop the Eliquis 2 days prior to the procedure

## 2018-02-11 ENCOUNTER — Other Ambulatory Visit: Payer: Self-pay | Admitting: Family Medicine

## 2018-02-18 DIAGNOSIS — R311 Benign essential microscopic hematuria: Secondary | ICD-10-CM | POA: Diagnosis not present

## 2018-02-19 ENCOUNTER — Ambulatory Visit (INDEPENDENT_AMBULATORY_CARE_PROVIDER_SITE_OTHER): Payer: Medicare Other | Admitting: Family Medicine

## 2018-02-19 ENCOUNTER — Encounter: Payer: Self-pay | Admitting: Family Medicine

## 2018-02-19 VITALS — BP 162/89 | HR 89 | Temp 96.8°F | Ht 61.5 in | Wt 150.0 lb

## 2018-02-19 DIAGNOSIS — K222 Esophageal obstruction: Secondary | ICD-10-CM | POA: Diagnosis not present

## 2018-02-19 DIAGNOSIS — N390 Urinary tract infection, site not specified: Secondary | ICD-10-CM

## 2018-02-19 DIAGNOSIS — I481 Persistent atrial fibrillation: Secondary | ICD-10-CM | POA: Diagnosis not present

## 2018-02-19 DIAGNOSIS — Z8673 Personal history of transient ischemic attack (TIA), and cerebral infarction without residual deficits: Secondary | ICD-10-CM

## 2018-02-19 DIAGNOSIS — I10 Essential (primary) hypertension: Secondary | ICD-10-CM | POA: Diagnosis not present

## 2018-02-19 DIAGNOSIS — K1120 Sialoadenitis, unspecified: Secondary | ICD-10-CM

## 2018-02-19 DIAGNOSIS — I4819 Other persistent atrial fibrillation: Secondary | ICD-10-CM

## 2018-02-19 MED ORDER — CEPHALEXIN 250 MG/5ML PO SUSR: 500.0000 mg | Freq: Two times a day (BID) | ORAL | 0 refills | Status: DC

## 2018-02-19 NOTE — Progress Notes (Signed)
Subjective:    Patient ID: Kari [name], female    DOB: 16-Jan-1934, 84 y.o.   MRN: [id number]  HPI  Patient here today for a sore in her mouth and elevated BP.  The patient comes with her son to the visit today.  She is complaining with some sores in her mouth.  She is unable to wear her bottom dentures.  She is scheduled for an esophageal dilatation because of a stricture on July 11.  Blood pressures at home have been running higher.  The blood pressure here today is 162/89.  Her weight is good at 150.  The patient denies any chest pain pressure tightness.  She recently had a visit to the urologist and everything was good and she is trying to stay more hydrated with drinking plenty of water.  She is vomiting sometimes twice daily now because of this esophageal stricture and it is becoming more urgent to get the esophageal dilatation done as soon as possible.  She is passing her water well and as I said got a good report from the urologist.   Patient Active Problem List   Diagnosis Date Noted  . Esophageal dysphagia 01/31/2018  . Pain in both feet 10/03/2017  . SDH (subdural hematoma) ([place]) 08/24/2017  . CVA (cerebral vascular accident) ([place]) 07/29/2017  . Influenza A 12/06/2016  . Sciatica of right side 04/23/2016  . At high risk for falls 04/23/2016  . Increased BMI 09/30/2015  . Hyperlipidemia 09/07/2015  . Low back pain 09/07/2015  . Hypoxia 09/06/2015  . Orthostatic syncope 09/04/2015  . Orthostatic hypotension 09/04/2015  . Aortic atherosclerosis ([place]) 05/03/2015  . Vitamin D deficiency 02/02/2015  . Pre-diabetes 02/02/2015  . Metabolic syndrome 20/25/4270  . Osteoporosis 03/11/2013  . Atrial fibrillation ([place]) 11/24/2012  . Hypokalemia 05/04/2012  . Obesity   . Symptomatic menopausal or female climacteric states   . Kyphosis   . Gallstones   . Personal history of adenomatous colonic polyps   . Essential hypertension 03/29/2009   Outpatient Encounter Medications as of  02/19/2018  Medication Sig  . atenolol (TENORMIN) 50 MG tablet Take 1 tablet (50 mg total) by mouth daily.  . Benfotiamine 150 MG CAPS Take 150 mg by mouth daily.  . captopril (CAPOTEN) 50 MG tablet TAKE (1) TABLET TWICE A DAY FOR HIGH BLOOD PRESSURE.  Marland Kitchen ELIQUIS 5 MG TABS tablet Take 1 tablet (5 mg total) by mouth 2 (two) times daily.  . potassium chloride (K-DUR) 10 MEQ tablet TAKE 1 TABLET DAILY  . Vitamin D, Ergocalciferol, (DRISDOL) 50000 units CAPS capsule TAKE 1 CAPSULE ONCE A WEEK  . [DISCONTINUED] potassium chloride (K-DUR) 10 MEQ tablet TAKE 1 TABLET DAILY   No facility-administered encounter medications on file as of 02/19/2018.      Review of Systems  Constitutional: Negative.   HENT: Negative.        Sore in mouth - lower   Eyes: Negative.   Respiratory: Negative.   Cardiovascular: Negative.   Gastrointestinal: Negative.   Endocrine: Negative.   Genitourinary: Negative.   Musculoskeletal: Negative.   Skin: Negative.   Allergic/Immunologic: Negative.   Neurological: Negative.   Hematological: Negative.   Psychiatric/Behavioral: Negative.        Objective:   Physical Exam  Constitutional: She is oriented to person, place, and time. She appears well-developed and well-nourished. No distress.  The patient is pleasant and alert and as usual is not a complainer and we have to have 1 of the family  members here to tell us everything that is going on.  HENT:  Head: Normocephalic and atraumatic.  Right Ear: External ear normal.  Left Ear: External ear normal.  Nose: Nose normal.  Swollen left submental salivary gland with no drainage  Eyes: Pupils are equal, round, and reactive to light. Conjunctivae and EOM are normal. Right eye exhibits no discharge. Left eye exhibits no discharge. No scleral icterus.  Neck: Normal range of motion. Neck supple. No thyromegaly present.  Cardiovascular: Normal rate and normal heart sounds.  No murmur heard. Heart is irregular irregular  at 72/min distal pulses were difficult to palpate today.  Pulmonary/Chest: Effort normal and breath sounds normal. She has no wheezes. She has no rales.  Clear anteriorly and posteriorly  Abdominal: Soft. Bowel sounds are normal. She exhibits no mass. There is no tenderness.  Musculoskeletal: She exhibits no edema.  Patient uses cane for ambulation  Lymphadenopathy:    She has no cervical adenopathy.  Neurological: She is alert and oriented to person, place, and time. She has normal reflexes. No cranial nerve deficit.  Skin: Skin is warm and dry. No rash noted.  Psychiatric: She has a normal mood and affect. Her behavior is normal. Judgment and thought content normal.  Mood affect and behavior were normal  Nursing note and vitals reviewed.  BP (!) 162/89 (BP Location: Right Arm)   Pulse 89   Temp (!) 96.8 F (36 C) (Oral)   Ht 5' 1.5" (1.562 m)   Wt 150 lb (68 kg)   BMI 27.88 kg/m   Repeat blood pressure which was faint was 140/72 with the patient being supine in the right arm with a regular cuff     Assessment & Plan:  1. Salivary gland infection -Keflex 250 suspension 2 teaspoons twice daily for 7 days  2. Essential hypertension -Continue to monitor blood pressure at home and watch sodium intake and bring blood pressure readings back for review at next visit  3. Frequent urinary tract infections -Continue to follow-up with urology  4. Esophageal stricture -Continue to follow-up with gastroenterology and get dilatation as soon as possible  5. Persistent atrial fibrillation (HCC) -Continue to follow-up with cardiology  No orders of the defined types were placed in this encounter.  Patient Instructions  Leave denture out as much as possible Swish mouth with half peroxide and half water for a minute or so after eating 3 times daily Take antibiotic as directed for 1 week Use liquid supplement like boost or Ensure since she is having trouble with solids and only give small  amounts at a time Return to the appointment as planned and bring blood pressure readings on return that are checked in the morning and in the evening Continue to watch sodium intake Follow-up with gastroenterologist as planned for dilatation  Arrie Senate MD

## 2018-02-19 NOTE — Patient Instructions (Signed)
Leave denture out as much as possible Swish mouth with half peroxide and half water for a minute or so after eating 3 times daily Take antibiotic as directed for 1 week Use liquid supplement like boost or Ensure since she is having trouble with solids and only give small amounts at a time Return to the appointment as planned and bring blood pressure readings on return that are checked in the morning and in the evening Continue to watch sodium intake Follow-up with gastroenterologist as planned for dilatation

## 2018-02-19 NOTE — Addendum Note (Signed)
Addended by: Zannie Cove on: 02/19/2018 09:40 AM   Modules accepted: Orders

## 2018-02-25 ENCOUNTER — Other Ambulatory Visit: Payer: Self-pay | Admitting: Family Medicine

## 2018-03-03 ENCOUNTER — Encounter (HOSPITAL_COMMUNITY)
Admission: RE | Disposition: A | Discharge status: Home / Self Care | Payer: Self-pay | Source: Ambulatory Visit | Attending: Internal Medicine

## 2018-03-03 ENCOUNTER — Other Ambulatory Visit: Payer: Self-pay

## 2018-03-03 ENCOUNTER — Encounter (HOSPITAL_COMMUNITY): Payer: Self-pay | Admitting: *Deleted

## 2018-03-03 ENCOUNTER — Ambulatory Visit (HOSPITAL_COMMUNITY)
Admission: RE | Admit: 2018-03-03 | Discharge: 2018-03-03 | Disposition: A | Discharge status: Home / Self Care | Payer: Medicare Other | Source: Ambulatory Visit | Attending: Internal Medicine | Admitting: Internal Medicine

## 2018-03-03 DIAGNOSIS — M810 Age-related osteoporosis without current pathological fracture: Secondary | ICD-10-CM | POA: Insufficient documentation

## 2018-03-03 DIAGNOSIS — Z79899 Other long term (current) drug therapy: Secondary | ICD-10-CM | POA: Diagnosis not present

## 2018-03-03 DIAGNOSIS — Z8673 Personal history of transient ischemic attack (TIA), and cerebral infarction without residual deficits: Secondary | ICD-10-CM | POA: Diagnosis not present

## 2018-03-03 DIAGNOSIS — Z7901 Long term (current) use of anticoagulants: Secondary | ICD-10-CM | POA: Insufficient documentation

## 2018-03-03 DIAGNOSIS — K449 Diaphragmatic hernia without obstruction or gangrene: Secondary | ICD-10-CM | POA: Diagnosis not present

## 2018-03-03 DIAGNOSIS — R1314 Dysphagia, pharyngoesophageal phase: Secondary | ICD-10-CM | POA: Diagnosis not present

## 2018-03-03 DIAGNOSIS — E559 Vitamin D deficiency, unspecified: Secondary | ICD-10-CM | POA: Insufficient documentation

## 2018-03-03 DIAGNOSIS — E669 Obesity, unspecified: Secondary | ICD-10-CM | POA: Diagnosis not present

## 2018-03-03 DIAGNOSIS — I4891 Unspecified atrial fibrillation: Secondary | ICD-10-CM | POA: Diagnosis not present

## 2018-03-03 DIAGNOSIS — R1319 Other dysphagia: Secondary | ICD-10-CM | POA: Insufficient documentation

## 2018-03-03 DIAGNOSIS — I10 Essential (primary) hypertension: Secondary | ICD-10-CM | POA: Diagnosis not present

## 2018-03-03 DIAGNOSIS — R1310 Dysphagia, unspecified: Secondary | ICD-10-CM | POA: Insufficient documentation

## 2018-03-03 DIAGNOSIS — E8881 Metabolic syndrome: Secondary | ICD-10-CM | POA: Diagnosis not present

## 2018-03-03 DIAGNOSIS — K222 Esophageal obstruction: Secondary | ICD-10-CM | POA: Insufficient documentation

## 2018-03-03 HISTORY — PX: ESOPHAGEAL DILATION: SHX303

## 2018-03-03 HISTORY — PX: ESOPHAGOGASTRODUODENOSCOPY: SHX5428

## 2018-03-03 SURGERY — EGD (ESOPHAGOGASTRODUODENOSCOPY)
Anesthesia: Moderate Sedation

## 2018-03-03 MED ORDER — MEPERIDINE HCL 50 MG/ML IJ SOLN
INTRAMUSCULAR | Status: DC | PRN
  Administered 2018-03-03: 25 mg via INTRAVENOUS

## 2018-03-03 MED ORDER — MEPERIDINE HCL 50 MG/ML IJ SOLN
INTRAMUSCULAR | Status: AC
  Filled 2018-03-03: qty 1

## 2018-03-03 MED ORDER — LIDOCAINE VISCOUS HCL 2 % MT SOLN
OROMUCOSAL | Status: DC | PRN
  Administered 2018-03-03: 1 via OROMUCOSAL

## 2018-03-03 MED ORDER — MIDAZOLAM HCL 5 MG/5ML IJ SOLN
INTRAMUSCULAR | Status: DC | PRN
  Administered 2018-03-03 (×2): 1 mg via INTRAVENOUS

## 2018-03-03 MED ORDER — MIDAZOLAM HCL 5 MG/5ML IJ SOLN
INTRAMUSCULAR | Status: AC
  Filled 2018-03-03: qty 10

## 2018-03-03 MED ORDER — SODIUM CHLORIDE 0.9 % IV SOLN
INTRAVENOUS | Status: DC
  Administered 2018-03-03: 13:00:00 via INTRAVENOUS

## 2018-03-03 MED ORDER — LIDOCAINE VISCOUS HCL 2 % MT SOLN
OROMUCOSAL | Status: AC
  Filled 2018-03-03: qty 15

## 2018-03-03 NOTE — Op Note (Signed)
[facility] Patient Name: Kari [name] Procedure Date: 03/03/2018 2:33 PM MRN: [id number] Date of Birth: 11-05-1933 Attending MD: Hildred Laser , MD CSN: [id number] Age: 84 Admit Type: Outpatient Procedure:                Upper GI endoscopy Indications:              Esophageal dysphagia Providers:                Hildred Laser, MD, Lurline Del, RN, Aram Candela Referring MD:             Chipper Herb, MD Medicines:                Lidocaine spray, Meperidine 25 mg IV, Midazolam 2                            mg IV Complications:            No immediate complications. Estimated Blood Loss:     Estimated blood loss was minimal. Procedure:                Pre-Anesthesia Assessment:                           - Prior to the procedure, a History and Physical                            was performed, and patient medications and                            allergies were reviewed. The patient's tolerance of                            previous anesthesia was also reviewed. The risks                            and benefits of the procedure and the sedation                            options and risks were discussed with the patient.                            All questions were answered, and informed consent                            was obtained. Prior Anticoagulants: The patient                            last took Eliquis (apixaban) 3 days prior to the                            procedure. ASA Grade Assessment: III - A patient                            with severe systemic disease. After reviewing the  risks and benefits, the patient was deemed in                            satisfactory condition to undergo the procedure.                           After obtaining informed consent, the endoscope was                            passed under direct vision. Throughout the                            procedure, the patient's blood pressure, pulse, and          oxygen saturations were monitored continuously. The                            EG-[id number] ([id number]) scope was introduced through the                            mouth, and advanced to the second part of duodenum.                            The upper GI endoscopy was accomplished without                            difficulty. The patient tolerated the procedure                            well. Scope In: 3:09:11 PM Scope Out: 3:17:57 PM Total Procedure Duration: 0 hours 8 minutes 46 seconds  Findings:      A severe Schatzki ring was found at the gastroesophageal junction(36 cm       from incisors). A TTS dilator was passed through the scope. Dilation       with a 15-16.5-18 mm balloon dilator was performed to only 15 mm. The       dilation site was examined and showed mild mucosal disruption, moderate       improvement in luminal narrowing and no perforation.      A 2 cm hiatal hernia was present.      The entire examined stomach was normal.      The duodenal bulb and second portion of the duodenum were normal. Impression:               - Severe Schatzki ring. Dilated.                           - 2 cm hiatal hernia.                           - Normal stomach.                           - Normal duodenal bulb and second portion of the  duodenum.                           - No specimens collected. Moderate Sedation:      Moderate (conscious) sedation was administered by the endoscopy nurse       and supervised by the endoscopist. The following parameters were       monitored: oxygen saturation, heart rate, blood pressure, CO2       capnography and response to care. Total physician intraservice time was       17 minutes. Recommendation:           - Patient has a contact number available for                            emergencies. The signs and symptoms of potential                            delayed complications were discussed with the                             patient. Return to normal activities tomorrow.                            Written discharge instructions were provided to the                            patient.                           - Resume previous diet today.                           - Continue present medications.                           - Resume Eliquis (apixaban) at prior dose on                            03/05/2018.                           - Repeat upper endoscopy PRN.                           - Return to GI clinic in 1 week. Procedure Code(s):        --- Professional ---                           [phone], Esophagogastroduodenoscopy, flexible,                            transoral; with transendoscopic balloon dilation of                            esophagus (less than 30 mm diameter)  G0500, Moderate sedation services provided by the                            same physician or other qualified health care                            professional performing a gastrointestinal                            endoscopic service that sedation supports,                            requiring the presence of an independent trained                            observer to assist in the monitoring of the                            patient's level of consciousness and physiological                            status; initial 15 minutes of intra-service time;                            patient age 32 years or older (additional time may                            be reported with [phone], as appropriate) Diagnosis Code(s):        --- Professional ---                           K22.2, Esophageal obstruction                           K44.9, Diaphragmatic hernia without obstruction or                            gangrene                           R13.14, Dysphagia, pharyngoesophageal phase CPT copyright 2017 American Medical Association. All rights reserved. The codes documented in this report are preliminary and upon  coder review may  be revised to meet current compliance requirements. Hildred Laser, MD Hildred Laser, MD 03/03/2018 3:36:56 PM This report has been signed electronically. Number of Addenda: 0

## 2018-03-03 NOTE — H&P (Signed)
Kari [name] is an 84 y.o. female.   Chief Complaint: Patient is here for EGD and ED. HPI: Patient is 84 year old Caucasian female with multiple medical problems who presents with 12-month history of dysphagia to solids.  She points to upper sternal area as site of bolus obstruction.  She says she she waits and food eventually goes down.  Sometimes she tries to force it down by eating more food.  She has not had any episode of food regurgitation.  Dysphagia occurs couple times a month.  She underwent evaluation by speech pathologist and oropharyngeal function was normal.  Reflux of contrast was noted in the distal esophagus but study was not focused on the esophagus.  She has good appetite.  She denies nausea vomiting weight loss or melena. Last Eliquis dose was 3 days ago.   Past Medical History:  Diagnosis Date  . Adenomatous polyps   . AF (atrial fibrillation) ([place])   . Cataract   . Gallstones   . Hx of adenomatous colonic polyps   . Hyperlipidemia    x5 years  . Hypertension    x5 years  . Kyphosis   . Metabolic syndrome X   . Obesity   . Osteoporosis   . Stroke ([place]) 07/2017  . Subdural hematoma ([place])   . Symptomatic menopausal or female climacteric states   . Vitamin D deficiency     Past Surgical History:  Procedure Laterality Date  . ABDOMINAL HYSTERECTOMY    . BLADDER SUSPENSION    . COLONOSCOPY  multiple  . EYE SURGERY Bilateral 09/2015  . VAGINAL HYSTERECTOMY     total    Family History  Problem Relation Age of Onset  . Heart attack Mother   . Stroke Mother   . Heart attack Father   . Alcohol abuse Father   . Hypertension Sister   . Stroke Sister   . Stroke Brother   . Hypertension Brother   . Heart attack Brother   . Hypertension Brother   . Depression Brother        suicide  . Hypertension Sister   . Heart attack Other   . Stroke Other   . Breast cancer Other   . Colon cancer Neg Hx    Social History:  reports that she has never smoked. She has  never used smokeless tobacco. She reports that she does not drink alcohol or use drugs.  Allergies:  Allergies  Allergen Reactions  . Erythromycin Other (See Comments)    Reaction unknown  . Penicillins Other (See Comments)    Reaction unknown  Has patient had a PCN reaction causing immediate rash, facial/tongue/throat swelling, SOB or lightheadedness with hypotension: unknown Has patient had a PCN reaction causing severe rash involving mucus membranes or skin necrosis: unknown Has patient had a PCN reaction that required hospitalization : unknown Has patient had a PCN reaction occurring within the last 10 years: no If all of the above answers are "NO", then may proceed with Cephalosporin use.     Medications Prior to Admission  Medication Sig Dispense Refill  . atenolol (TENORMIN) 50 MG tablet Take 1 tablet (50 mg total) by mouth daily. 90 tablet 3  . Benfotiamine 150 MG CAPS Take 150 mg by mouth daily.    . captopril (CAPOTEN) 50 MG tablet TAKE (1) TABLET TWICE A DAY FOR HIGH BLOOD PRESSURE. 60 tablet 5  . cephALEXin (KEFLEX) 250 MG/5ML suspension Take 10 mLs (500 mg total) by mouth 2 (two) times daily.  For 7 days 200 mL 0  . ELIQUIS 5 MG TABS tablet Take 1 tablet (5 mg total) by mouth 2 (two) times daily. 60 tablet 0  . ferrous sulfate 325 (65 FE) MG tablet Take 325 mg by mouth daily.    . potassium chloride (K-DUR) 10 MEQ tablet TAKE 1 TABLET DAILY 30 tablet 5  . Vitamin D, Ergocalciferol, (DRISDOL) 50000 units CAPS capsule TAKE 1 CAPSULE ONCE A WEEK 12 capsule 0    No results found for this or any previous visit (from the past 48 hour(s)). No results found.  ROS  Blood pressure (!) 154/79, pulse 76, temperature 98.2 F (36.8 C), temperature source Oral, resp. rate 18, SpO2 96 %. Physical Exam  Constitutional: She appears well-developed and well-nourished.  HENT:  Mouth/Throat: Oropharynx is clear and moist.  Eyes: Conjunctivae are normal. No scleral icterus.  Neck: No  thyromegaly present.  Cardiovascular:  Irregular rhythm normal S1 and S2.  No murmur or gallop noted.  Respiratory: Effort normal and breath sounds normal.  GI: Soft. She exhibits no distension and no mass. There is no tenderness.  Musculoskeletal: She exhibits no edema.  Lymphadenopathy:    She has no cervical adenopathy.  Neurological: She is alert.  Skin: Skin is warm and dry.     Assessment/Plan Solid food dysphagia. EGD with ED.  Hildred Laser, MD 03/03/2018, 2:57 PM

## 2018-03-03 NOTE — Discharge Instructions (Signed)
Esophagogastroduodenoscopy °Esophagogastroduodenoscopy (EGD) is a procedure to examine the lining of the esophagus, stomach, and first part of the small intestine (duodenum). This procedure is done to check for problems such as inflammation, bleeding, ulcers, or growths. °During this procedure, a long, flexible, lighted tube with a camera attached (endoscope) is inserted down the throat. °Tell a health care provider about: °· Any allergies you have. °· All medicines you are taking, including vitamins, herbs, eye drops, creams, and over-the-counter medicines. °· Any problems you or family members have had with anesthetic medicines. °· Any blood disorders you have. °· Any surgeries you have had. °· Any medical conditions you have. °· Whether you are pregnant or may be pregnant. °What are the risks? °Generally, this is a safe procedure. However, problems may occur, including: °· Infection. °· Bleeding. °· A tear (perforation) in the esophagus, stomach, or duodenum. °· Trouble breathing. °· Excessive sweating. °· Spasms of the larynx. °· A slowed heartbeat. °· Low blood pressure. ° °What happens before the procedure? °· Follow instructions from your health care provider about eating or drinking restrictions. °· Ask your health care provider about: °? Changing or stopping your regular medicines. This is especially important if you are taking diabetes medicines or blood thinners. °? Taking medicines such as aspirin and ibuprofen. These medicines can thin your blood. Do not take these medicines before your procedure if your health care provider instructs you not to. °· Plan to have someone take you home after the procedure. °· If you wear dentures, be ready to remove them before the procedure. °What happens during the procedure? °· To reduce your risk of infection, your health care team will wash or sanitize their hands. °· An IV tube will be put in a vein in your hand or arm. You will get medicines and fluids through this  tube. °· You will be given one or more of the following: °? A medicine to help you relax (sedative). °? A medicine to numb the area (local anesthetic). This medicine may be sprayed into your throat. It will make you feel more comfortable and keep you from gagging or coughing during the procedure. °? A medicine for pain. °· A mouth guard may be placed in your mouth to protect your teeth and to keep you from biting on the endoscope. °· You will be asked to lie on your left side. °· The endoscope will be lowered down your throat into your esophagus, stomach, and duodenum. °· Air will be put into the endoscope. This will help your health care provider see better. °· The lining of your esophagus, stomach, and duodenum will be examined. °· Your health care provider may: °? Take a tissue sample so it can be looked at in a lab (biopsy). °? Remove growths. °? Remove objects (foreign bodies) that are stuck. °? Treat any bleeding with medicines or other devices that stop tissue from bleeding. °? Widen (dilate) or stretch narrowed areas of your esophagus and stomach. °· The endoscope will be taken out. °The procedure may vary among health care providers and hospitals. °What happens after the procedure? °· Your blood pressure, heart rate, breathing rate, and blood oxygen level will be monitored often until the medicines you were given have worn off. °· Do not eat or drink anything until the numbing medicine has worn off and your gag reflex has returned. °This information is not intended to replace advice given to you by your health care provider. Make sure you discuss any questions you   have with your health care provider. Document Released: 12/21/2004 Document Revised: 01/26/2016 Document Reviewed: 07/14/2015 Elsevier Interactive Patient Education  2018 [place] on the evening of 03/05/2018. Resume other medications as before. Resume usual diet. No driving for 24 hours. Please call office with progress  report next week. May consider repeat dilation depending on symptoms.

## 2018-03-04 ENCOUNTER — Ambulatory Visit: Payer: Medicare Other | Admitting: Family Medicine

## 2018-03-07 ENCOUNTER — Encounter (HOSPITAL_COMMUNITY): Payer: Self-pay | Admitting: Internal Medicine

## 2018-03-11 ENCOUNTER — Other Ambulatory Visit: Payer: Self-pay | Admitting: Family Medicine

## 2018-03-18 ENCOUNTER — Encounter (INDEPENDENT_AMBULATORY_CARE_PROVIDER_SITE_OTHER): Payer: Self-pay | Admitting: Internal Medicine

## 2018-03-18 ENCOUNTER — Ambulatory Visit (INDEPENDENT_AMBULATORY_CARE_PROVIDER_SITE_OTHER): Payer: Medicare Other | Admitting: Internal Medicine

## 2018-03-18 VITALS — BP 160/80 | HR 56 | Temp 98.2°F | Ht 61.0 in | Wt 149.8 lb

## 2018-03-18 DIAGNOSIS — M79676 Pain in unspecified toe(s): Secondary | ICD-10-CM | POA: Diagnosis not present

## 2018-03-18 DIAGNOSIS — B351 Tinea unguium: Secondary | ICD-10-CM | POA: Diagnosis not present

## 2018-03-18 DIAGNOSIS — I6340 Cerebral infarction due to embolism of unspecified cerebral artery: Secondary | ICD-10-CM

## 2018-03-18 DIAGNOSIS — R1319 Other dysphagia: Secondary | ICD-10-CM

## 2018-03-18 DIAGNOSIS — R1310 Dysphagia, unspecified: Secondary | ICD-10-CM | POA: Diagnosis not present

## 2018-03-18 DIAGNOSIS — I70203 Unspecified atherosclerosis of native arteries of extremities, bilateral legs: Secondary | ICD-10-CM | POA: Diagnosis not present

## 2018-03-18 DIAGNOSIS — L84 Corns and callosities: Secondary | ICD-10-CM | POA: Diagnosis not present

## 2018-03-18 NOTE — Progress Notes (Signed)
Subjective:    Patient ID: Kari [name], female    DOB: 1934-06-18, 84 y.o.   MRN: [id number]  HPI  Here today for f/u. Underwent an EGD/ED 03/03/2018 for dysphagia. EGD revealed a severe Schatzki ring. Dilated. 2cm hiatal hernia. Normal stomach. Normal duodenal bulb and second portion of the duodenum.  She tells me she is better. She had one episode of choking. She is eating anything she wants. She rarely eats red meats. Appetite is good. No weight loss. BMs are normal.    Hx of CVA and atrial fib and maintained on Eliquis.   Review of Systems Past Medical History:  Diagnosis Date  . Adenomatous polyps   . AF (atrial fibrillation) ([place])   . Cataract   . Gallstones   . Hx of adenomatous colonic polyps   . Hyperlipidemia    x5 years  . Hypertension    x5 years  . Kyphosis   . Metabolic syndrome X   . Obesity   . Osteoporosis   . Stroke ([place]) 07/2017  . Subdural hematoma ([place])   . Symptomatic menopausal or female climacteric states   . Vitamin D deficiency     Past Surgical History:  Procedure Laterality Date  . ABDOMINAL HYSTERECTOMY    . BLADDER SUSPENSION    . COLONOSCOPY  multiple  . ESOPHAGEAL DILATION N/A 03/03/2018   Procedure: ESOPHAGEAL DILATION;  Surgeon: Rogene Houston, MD;  Location: [facility]O [facility];  Service: Endoscopy;  Laterality: N/A;  . ESOPHAGOGASTRODUODENOSCOPY N/A 03/03/2018   Procedure: ESOPHAGOGASTRODUODENOSCOPY (EGD);  Surgeon: Rogene Houston, MD;  Location: [facility] ENDO SUITE;  Service: Endoscopy;  Laterality: N/A;  . EYE SURGERY Bilateral 09/2015  . VAGINAL HYSTERECTOMY     total    Allergies  Allergen Reactions  . Erythromycin Other (See Comments)    Reaction unknown  . Penicillins Other (See Comments)    Reaction unknown  Has patient had a PCN reaction causing immediate rash, facial/tongue/throat swelling, SOB or lightheadedness with hypotension: unknown Has patient had a PCN reaction causing severe rash involving mucus membranes or skin  necrosis: unknown Has patient had a PCN reaction that required hospitalization : unknown Has patient had a PCN reaction occurring within the last 10 years: no If all of the above answers are "NO", then may proceed with Cephalosporin use.     Current Outpatient Medications on File Prior to Visit  Medication Sig Dispense Refill  . apixaban (ELIQUIS) 5 MG TABS tablet Take 1 tablet (5 mg total) by mouth 2 (two) times daily. 60 tablet 0  . atenolol (TENORMIN) 50 MG tablet Take 1 tablet (50 mg total) by mouth daily. 90 tablet 3  . Benfotiamine 150 MG CAPS Take 150 mg by mouth daily.    . captopril (CAPOTEN) 50 MG tablet TAKE (1) TABLET TWICE A DAY FOR HIGH BLOOD PRESSURE. 60 tablet 5  . ferrous sulfate 325 (65 FE) MG tablet Take 325 mg by mouth daily.    . potassium chloride (K-DUR) 10 MEQ tablet TAKE 1 TABLET DAILY 30 tablet 4  . Vitamin D, Ergocalciferol, (DRISDOL) 50000 units CAPS capsule TAKE 1 CAPSULE ONCE A WEEK 12 capsule 0   No current facility-administered medications on file prior to visit.         Objective:   Physical Exam Blood pressure (!) 160/80, pulse (!) 56, temperature 98.2 F (36.8 C), height 5\' 1"  (1.549 m), weight 149 lb 12.8 oz (67.9 kg). Alert and oriented. Skin warm and dry. Oral  mucosa is moist.   . Sclera anicteric, conjunctivae is pink. Thyroid not enlarged. No cervical lymphadenopathy. Lungs clear. Heart regular rate and rhythm.  Abdomen is soft. Bowel sounds are positive. No hepatomegaly. No abdominal masses felt. No tenderness.  No edema to lower extremities.           Assessment & Plan:  Dysphagia. She is doing well. Chew foods well. She will call if she has any problems. OV as needed.

## 2018-03-18 NOTE — Patient Instructions (Signed)
OV as needed 

## 2018-03-26 ENCOUNTER — Other Ambulatory Visit: Payer: Self-pay | Admitting: Family Medicine

## 2018-03-27 ENCOUNTER — Telehealth: Payer: Self-pay

## 2018-03-27 NOTE — Telephone Encounter (Signed)
[facility] recommended a lung xray each year  Its been a year

## 2018-03-27 NOTE — Telephone Encounter (Signed)
Please arrange repeat lung films as planned for patient

## 2018-04-01 NOTE — Telephone Encounter (Signed)
Noted in chart.

## 2018-04-02 ENCOUNTER — Other Ambulatory Visit: Payer: Self-pay | Admitting: Family Medicine

## 2018-04-02 ENCOUNTER — Telehealth: Payer: Self-pay

## 2018-04-02 DIAGNOSIS — R911 Solitary pulmonary nodule: Secondary | ICD-10-CM

## 2018-04-02 NOTE — Telephone Encounter (Signed)
Pt notified we are scheduling appt for CT chest Will call with appt

## 2018-04-02 NOTE — Telephone Encounter (Signed)
Please let patient's daughter know something in regard to this need for this repeat scan so that they do not have to keep calling back.

## 2018-04-02 NOTE — Telephone Encounter (Signed)
Calling again  Mother suppose to get an xray yearly for lungs   Have not heard back

## 2018-04-09 ENCOUNTER — Ambulatory Visit (HOSPITAL_COMMUNITY)
Admission: RE | Admit: 2018-04-09 | Discharge: 2018-04-09 | Disposition: A | Discharge status: Home / Self Care | Payer: Medicare Other | Source: Ambulatory Visit | Attending: Family Medicine | Admitting: Family Medicine

## 2018-04-09 DIAGNOSIS — I2510 Atherosclerotic heart disease of native coronary artery without angina pectoris: Secondary | ICD-10-CM | POA: Diagnosis not present

## 2018-04-09 DIAGNOSIS — K8020 Calculus of gallbladder without cholecystitis without obstruction: Secondary | ICD-10-CM | POA: Insufficient documentation

## 2018-04-09 DIAGNOSIS — R918 Other nonspecific abnormal finding of lung field: Secondary | ICD-10-CM | POA: Insufficient documentation

## 2018-04-09 DIAGNOSIS — R911 Solitary pulmonary nodule: Secondary | ICD-10-CM | POA: Diagnosis not present

## 2018-04-09 DIAGNOSIS — I700 Atherosclerosis of aorta: Secondary | ICD-10-CM | POA: Diagnosis not present

## 2018-04-11 ENCOUNTER — Telehealth: Payer: Self-pay | Admitting: Family Medicine

## 2018-04-14 ENCOUNTER — Ambulatory Visit (INDEPENDENT_AMBULATORY_CARE_PROVIDER_SITE_OTHER): Payer: Medicare Other | Admitting: Family Medicine

## 2018-04-14 ENCOUNTER — Encounter: Payer: Self-pay | Admitting: Family Medicine

## 2018-04-14 VITALS — BP 192/90 | HR 77 | Temp 98.0°F | Ht 61.0 in | Wt 153.0 lb

## 2018-04-14 DIAGNOSIS — I700 Atherosclerosis of aorta: Secondary | ICD-10-CM

## 2018-04-14 DIAGNOSIS — I481 Persistent atrial fibrillation: Secondary | ICD-10-CM | POA: Diagnosis not present

## 2018-04-14 DIAGNOSIS — I639 Cerebral infarction, unspecified: Secondary | ICD-10-CM | POA: Diagnosis not present

## 2018-04-14 DIAGNOSIS — I6930 Unspecified sequelae of cerebral infarction: Secondary | ICD-10-CM | POA: Diagnosis not present

## 2018-04-14 DIAGNOSIS — E559 Vitamin D deficiency, unspecified: Secondary | ICD-10-CM | POA: Diagnosis not present

## 2018-04-14 DIAGNOSIS — I10 Essential (primary) hypertension: Secondary | ICD-10-CM

## 2018-04-14 DIAGNOSIS — E7800 Pure hypercholesterolemia, unspecified: Secondary | ICD-10-CM

## 2018-04-14 DIAGNOSIS — I4819 Other persistent atrial fibrillation: Secondary | ICD-10-CM

## 2018-04-14 NOTE — Progress Notes (Signed)
Subjective:    Patient ID: Kari [name], female    DOB: 05-12-1934, 84 y.o.   MRN: [id number]  HPI Pt here for follow up and management of chronic medical problems which includes a fib, hyperlipidemia, and hypertension. She is taking medication regularly.  The patient finally got her follow-up CT of the chest.  The 2 mm nodule noted in the right upper lobe was stable.  As well as the 7 mm nodule in the right lower lobe.  The radiologist recommended one more CT scan in 12 months for follow-up.  They again noted cholelithiasis coronary artery calcifications and aortic atherosclerosis.  We will review this with [name] today during the visit.  She does complain of a dry cough today and has questions about the iron pill.  She will get lab work today.  They bring in home blood pressures for review and the majority of these are running in the 150s 810 for the systolics and pretty good for the diastolics may be as high as in the low 90s at the highest.  Pressure at home this morning however was 140/75.  The patient is currently taking atenolol 50 mg and Capoten 50 mg.  She is also on potassium 10 mEq daily.  An outside blood pressure at the gastroenterology office in July was 160/80.  Blood pressure by Dr. Percival Spanish in March was 110/80.  The patient is doing well overall comes to the visit with her daughter.  She is swallowing better eating better and has no return visit scheduled with the gastroenterologist.  She denies any chest pain pressure tightness shortness of breath problems with her stomach including nausea vomiting diarrhea and no trouble with passing her water.  The good news is she is drinking a lot more water and the daughter confirmed this.  She also will go back on the pravastatin that she is stopped taking when she was so sick.  The patient's daughter was given a copy of the CT scan that was done recently for their records and they understand to repeat a CT scan in another year.     Patient  Active Problem List   Diagnosis Date Noted  . Esophageal dysphagia 01/31/2018  . Pain in both feet 10/03/2017  . SDH (subdural hematoma) ([place]) 08/24/2017  . CVA (cerebral vascular accident) ([place]) 07/29/2017  . Influenza A 12/06/2016  . Sciatica of right side 04/23/2016  . At high risk for falls 04/23/2016  . Increased BMI 09/30/2015  . Hyperlipidemia 09/07/2015  . Low back pain 09/07/2015  . Hypoxia 09/06/2015  . Orthostatic syncope 09/04/2015  . Orthostatic hypotension 09/04/2015  . Aortic atherosclerosis ([place]) 05/03/2015  . Vitamin D deficiency 02/02/2015  . Pre-diabetes 02/02/2015  . Metabolic syndrome 17/51/0258  . Osteoporosis 03/11/2013  . Atrial fibrillation ([place]) 11/24/2012  . Hypokalemia 05/04/2012  . Obesity   . Symptomatic menopausal or female climacteric states   . Kyphosis   . Gallstones   . Personal history of adenomatous colonic polyps   . Essential hypertension 03/29/2009   Outpatient Encounter Medications as of 04/14/2018  Medication Sig  . atenolol (TENORMIN) 50 MG tablet Take 1 tablet (50 mg total) by mouth daily.  . Benfotiamine 150 MG CAPS Take 150 mg by mouth daily.  . captopril (CAPOTEN) 50 MG tablet TAKE (1) TABLET TWICE A DAY FOR HIGH BLOOD PRESSURE.  Marland Kitchen ELIQUIS 5 MG TABS tablet Take 1 tablet (5 mg total) by mouth 2 (two) times daily.  . ferrous sulfate 325 (65  FE) MG tablet Take 325 mg by mouth daily.  . potassium chloride (K-DUR) 10 MEQ tablet TAKE 1 TABLET DAILY  . Vitamin D, Ergocalciferol, (DRISDOL) 50000 units CAPS capsule TAKE 1 CAPSULE ONCE A WEEK   No facility-administered encounter medications on file as of 04/14/2018.      Review of Systems  Constitutional: Negative.   HENT: Negative.   Eyes: Negative.   Respiratory: Positive for cough (dry).   Cardiovascular: Negative.   Gastrointestinal: Negative.   Endocrine: Negative.   Genitourinary: Negative.   Musculoskeletal: Negative.   Skin: Negative.   Allergic/Immunologic: Negative.     Neurological: Negative.   Hematological: Negative.   Psychiatric/Behavioral: Negative.        Objective:   Physical Exam  Constitutional: She is oriented to person, place, and time. She appears well-developed and well-nourished. No distress.  The patient is pleasant and alert and looks more like her usual self.  HENT:  Head: Normocephalic and atraumatic.  Right Ear: External ear normal.  Left Ear: External ear normal.  Nose: Nose normal.  Mouth/Throat: Oropharynx is clear and moist. No oropharyngeal exudate.  Eyes: Pupils are equal, round, and reactive to light. Conjunctivae and EOM are normal. Right eye exhibits no discharge. Left eye exhibits no discharge. No scleral icterus.  Neck: Normal range of motion. Neck supple. No thyromegaly present.  No bruits thyromegaly or anterior cervical adenopathy.  Cardiovascular: Normal rate, normal heart sounds and intact distal pulses.  No murmur heard. Rhythm was irregular irregular at about 84/min.  Pedal pulses were difficult to palpate.  Pulmonary/Chest: Effort normal and breath sounds normal. She has no wheezes. She has no rales.  Clear anteriorly and posteriorly  Abdominal: Soft. Bowel sounds are normal. She exhibits no mass. There is no tenderness.  No liver or spleen enlargement no epigastric or suprapubic tenderness no masses.  Musculoskeletal: Normal range of motion. She exhibits no edema.  Good movement of all extremities.  Lymphadenopathy:    She has no cervical adenopathy.  Neurological: She is alert and oriented to person, place, and time. She has normal reflexes. No cranial nerve deficit.  Skin: Skin is warm and dry. No rash noted.  Psychiatric: She has a normal mood and affect. Her behavior is normal. Judgment and thought content normal.  The patient's affect and behavior are all normal for her.  Nursing note and vitals reviewed.  BP (!) 192/90 (BP Location: Right Arm)   Pulse 77   Temp 98 F (36.7 C) (Oral)   Ht _0   (1.549 m)   Wt 153 lb (69.4 kg)   BMI 28.91 kg/m         Assessment & Plan:  1. Essential hypertension The blood pressure was elevated by the nurse and on repeat by me with the patient being supine I got 122/80 in the right arm.  The readings at home have been recorded by the patient's husband and we will have blood pressure readings recorded by the son and daughter and they will be brought back for review.  We also will do a 24-hour blood pressure monitor on this patient so we can better assess what her real blood pressure is. - BMP8+EGFR - CBC with Differential/Platelet - Hepatic function panel  2. Persistent atrial fibrillation ([place]) -He was in atrial fib today at 84/min. - CBC with Differential/Platelet  3. Cerebrovascular accident (CVA), unspecified mechanism ([place]) -No ongoing problems with weakness on one side or the other. - CBC with Differential/Platelet  4. Pure hypercholesterolemia -  Resume pravastatin - CBC with Differential/Platelet - Lipid panel  5. Vitamin D deficiency -Continue vitamin D replacement pending results of lab work - CBC with Differential/Platelet - VITAMIN D 25 Hydroxy (Vit-D Deficiency, Fractures)  6. Thoracic aortic atherosclerosis ([facility]) -Continue pravastatin which she had been off of when she had all the nausea and vomiting which is now improved since the dilatation was done. - BMP8+EGFR - CBC with Differential/Platelet  Patient Instructions                       Medicare Annual Wellness Visit  [facility] and the medical providers at [facility] strive to bring you the best medical care.  In doing so we not only want to address your current medical conditions and concerns but also to detect new conditions early and prevent illness, disease and health-related problems.    Medicare offers a yearly Wellness Visit which allows our clinical staff to assess your need for preventative services including immunizations,  lifestyle education, counseling to decrease risk of preventable diseases and screening for fall risk and other medical concerns.    This visit is provided free of charge (no copay) for all Medicare recipients. The clinical pharmacists at [facility] have begun to conduct these Wellness Visits which will also include a thorough review of all your medications.    As you primary medical provider recommend that you make an appointment for your Annual Wellness Visit if you have not done so already this year.  You may set up this appointment before you leave today or you may call back ([phone]) and schedule an appointment.  Please make sure when you call that you mention that you are scheduling your Annual Wellness Visit with the clinical pharmacist so that the appointment may be made for the proper length of time.     Continue current medications. Continue good therapeutic lifestyle changes which include good diet and exercise. Fall precautions discussed with patient. If an FOBT was given today- please return it to our front desk. If you are over 49 years old - you may need Prevnar 72 or the adult Pneumonia vaccine.  **Flu shots are available--- please call and schedule a FLU-CLINIC appointment**  After your visit with [place] today you will receive a survey in the mail or online from [employer] regarding your care with [place]. Please take a moment to fill this out. Your feedback is very important to [place] as you can help [place] better understand your patient needs as well as improve your experience and satisfaction. WE CARE ABOUT YOU!!!   Follow-up with cardiology as planned Remember to get CT scan in 1 year to follow-up pulmonary nodules The patient will return to the office in the next day or so to have a 24-hour blood pressure monitor placed on her. The daughter and son will have the blood pressures double checked by them and they will check the blood pressures also at home.  They will  record readings and bring these back in addition we will get the 24-hour monitor readings. The patient will try to watch her sodium intake more closely.  She says that she likes her salt.   Arrie Senate MD

## 2018-04-14 NOTE — Patient Instructions (Addendum)
Medicare Annual Wellness Visit  [facility] and the medical providers at [facility] strive to bring you the best medical care.  In doing so we not only want to address your current medical conditions and concerns but also to detect new conditions early and prevent illness, disease and health-related problems.    Medicare offers a yearly Wellness Visit which allows our clinical staff to assess your need for preventative services including immunizations, lifestyle education, counseling to decrease risk of preventable diseases and screening for fall risk and other medical concerns.    This visit is provided free of charge (no copay) for all Medicare recipients. The clinical pharmacists at [facility] have begun to conduct these Wellness Visits which will also include a thorough review of all your medications.    As you primary medical provider recommend that you make an appointment for your Annual Wellness Visit if you have not done so already this year.  You may set up this appointment before you leave today or you may call back ([phone]) and schedule an appointment.  Please make sure when you call that you mention that you are scheduling your Annual Wellness Visit with the clinical pharmacist so that the appointment may be made for the proper length of time.     Continue current medications. Continue good therapeutic lifestyle changes which include good diet and exercise. Fall precautions discussed with patient. If an FOBT was given today- please return it to our front desk. If you are over 82 years old - you may need Prevnar 19 or the adult Pneumonia vaccine.  **Flu shots are available--- please call and schedule a FLU-CLINIC appointment**  After your visit with [place] today you will receive a survey in the mail or online from [employer] regarding your care with [place]. Please take a moment to fill this out. Your feedback is very  important to [place] as you can help [place] better understand your patient needs as well as improve your experience and satisfaction. WE CARE ABOUT YOU!!!   Follow-up with cardiology as planned Remember to get CT scan in 1 year to follow-up pulmonary nodules The patient will return to the office in the next day or so to have a 24-hour blood pressure monitor placed on her. The daughter and son will have the blood pressures double checked by them and they will check the blood pressures also at home.  They will record readings and bring these back in addition we will get the 24-hour monitor readings. The patient will try to watch her sodium intake more closely.  She says that she likes her salt.

## 2018-04-15 ENCOUNTER — Ambulatory Visit: Payer: Medicare Other | Admitting: *Deleted

## 2018-04-15 DIAGNOSIS — I10 Essential (primary) hypertension: Secondary | ICD-10-CM

## 2018-04-15 LAB — CBC WITH DIFFERENTIAL/PLATELET
BASOS ABS: 0 10*3/uL (ref 0.0–0.2)
Basos: 0 %
EOS (ABSOLUTE): 0.2 10*3/uL (ref 0.0–0.4)
Eos: 3 %
HEMOGLOBIN: 14.3 g/dL (ref 11.1–15.9)
Hematocrit: 45.3 % (ref 34.0–46.6)
IMMATURE GRANULOCYTES: 0 %
Immature Grans (Abs): 0 10*3/uL (ref 0.0–0.1)
Lymphocytes Absolute: 1.6 10*3/uL (ref 0.7–3.1)
Lymphs: 23 %
MCH: 28.2 pg (ref 26.6–33.0)
MCHC: 31.6 g/dL (ref 31.5–35.7)
MCV: 89 fL (ref 79–97)
MONOCYTES: 9 %
Monocytes Absolute: 0.6 10*3/uL (ref 0.1–0.9)
NEUTROS ABS: 4.3 10*3/uL (ref 1.4–7.0)
Neutrophils: 65 %
PLATELETS: 229 10*3/uL (ref 150–450)
RBC: 5.07 x10E6/uL (ref 3.77–5.28)
RDW: 18.5 % — ABNORMAL HIGH (ref 12.3–15.4)
WBC: 6.7 10*3/uL (ref 3.4–10.8)

## 2018-04-15 LAB — BMP8+EGFR
BUN/Creatinine Ratio: 13 (ref 12–28)
BUN: 7 mg/dL — AB (ref 8–27)
CALCIUM: 9.3 mg/dL (ref 8.7–10.3)
CHLORIDE: 97 mmol/L (ref 96–106)
CO2: 24 mmol/L (ref 20–29)
Creatinine, Ser: 0.55 mg/dL — ABNORMAL LOW (ref 0.57–1.00)
GFR calc non Af Amer: 88 mL/min/{1.73_m2} (ref >59)
GFR, EST AFRICAN AMERICAN: 102 mL/min/{1.73_m2} (ref >59)
Glucose: 100 mg/dL — ABNORMAL HIGH (ref 65–99)
Potassium: 4 mmol/L (ref 3.5–5.2)
Sodium: 137 mmol/L (ref 134–144)

## 2018-04-15 LAB — LIPID PANEL
CHOLESTEROL TOTAL: 183 mg/dL (ref 100–199)
Chol/HDL Ratio: 4.6 ratio — ABNORMAL HIGH (ref 0.0–4.4)
HDL: 40 mg/dL (ref >39)
LDL CALC: 119 mg/dL — AB (ref 0–99)
TRIGLYCERIDES: 118 mg/dL (ref 0–149)
VLDL CHOLESTEROL CAL: 24 mg/dL (ref 5–40)

## 2018-04-15 LAB — HEPATIC FUNCTION PANEL
ALK PHOS: 77 IU/L (ref 39–117)
ALT: 15 IU/L (ref 0–32)
AST: 22 IU/L (ref 0–40)
Albumin: 4.1 g/dL (ref 3.5–4.7)
BILIRUBIN, DIRECT: 0.14 mg/dL (ref 0.00–0.40)
Bilirubin Total: 0.4 mg/dL (ref 0.0–1.2)
Total Protein: 7.2 g/dL (ref 6.0–8.5)

## 2018-04-15 LAB — VITAMIN D 25 HYDROXY (VIT D DEFICIENCY, FRACTURES): Vit D, 25-Hydroxy: 46.6 ng/mL (ref 30.0–100.0)

## 2018-04-15 NOTE — Progress Notes (Signed)
24 Hr AMB BP monitor started per order of Dr Laurance Flatten. Pt to return in the am for the removal. Voices understanding of the monitor

## 2018-04-16 ENCOUNTER — Other Ambulatory Visit: Payer: Self-pay | Admitting: *Deleted

## 2018-04-16 MED ORDER — AMLODIPINE BESYLATE 2.5 MG PO TABS: 2.5000 mg | ORAL_TABLET | Freq: Every day | ORAL | 3 refills | Status: DC

## 2018-04-16 NOTE — Progress Notes (Signed)
New - amlodipine added per 24 hour BP monitor - sheet to be scanned in

## 2018-04-22 ENCOUNTER — Telehealth: Payer: Self-pay | Admitting: Family Medicine

## 2018-04-22 NOTE — Telephone Encounter (Signed)
Aware of results per result note  

## 2018-04-22 NOTE — Telephone Encounter (Signed)
Daughter aware that last prescribed pravastatin was 40mg .  Daughter states that patient was previously taking 80mg  and cut in half.  Daughter aware ok for patient to take 40mg  of pravastatin

## 2018-04-25 ENCOUNTER — Other Ambulatory Visit: Payer: Self-pay | Admitting: Family Medicine

## 2018-05-12 IMAGING — CR DG CHEST 1V PORT
1 series · 1 of 1 positions shown · non-contrast
Comparison: 09/06/2015

CLINICAL DATA: Cough and congestion

EXAM:
PORTABLE CHEST 1 VIEW

[portable]
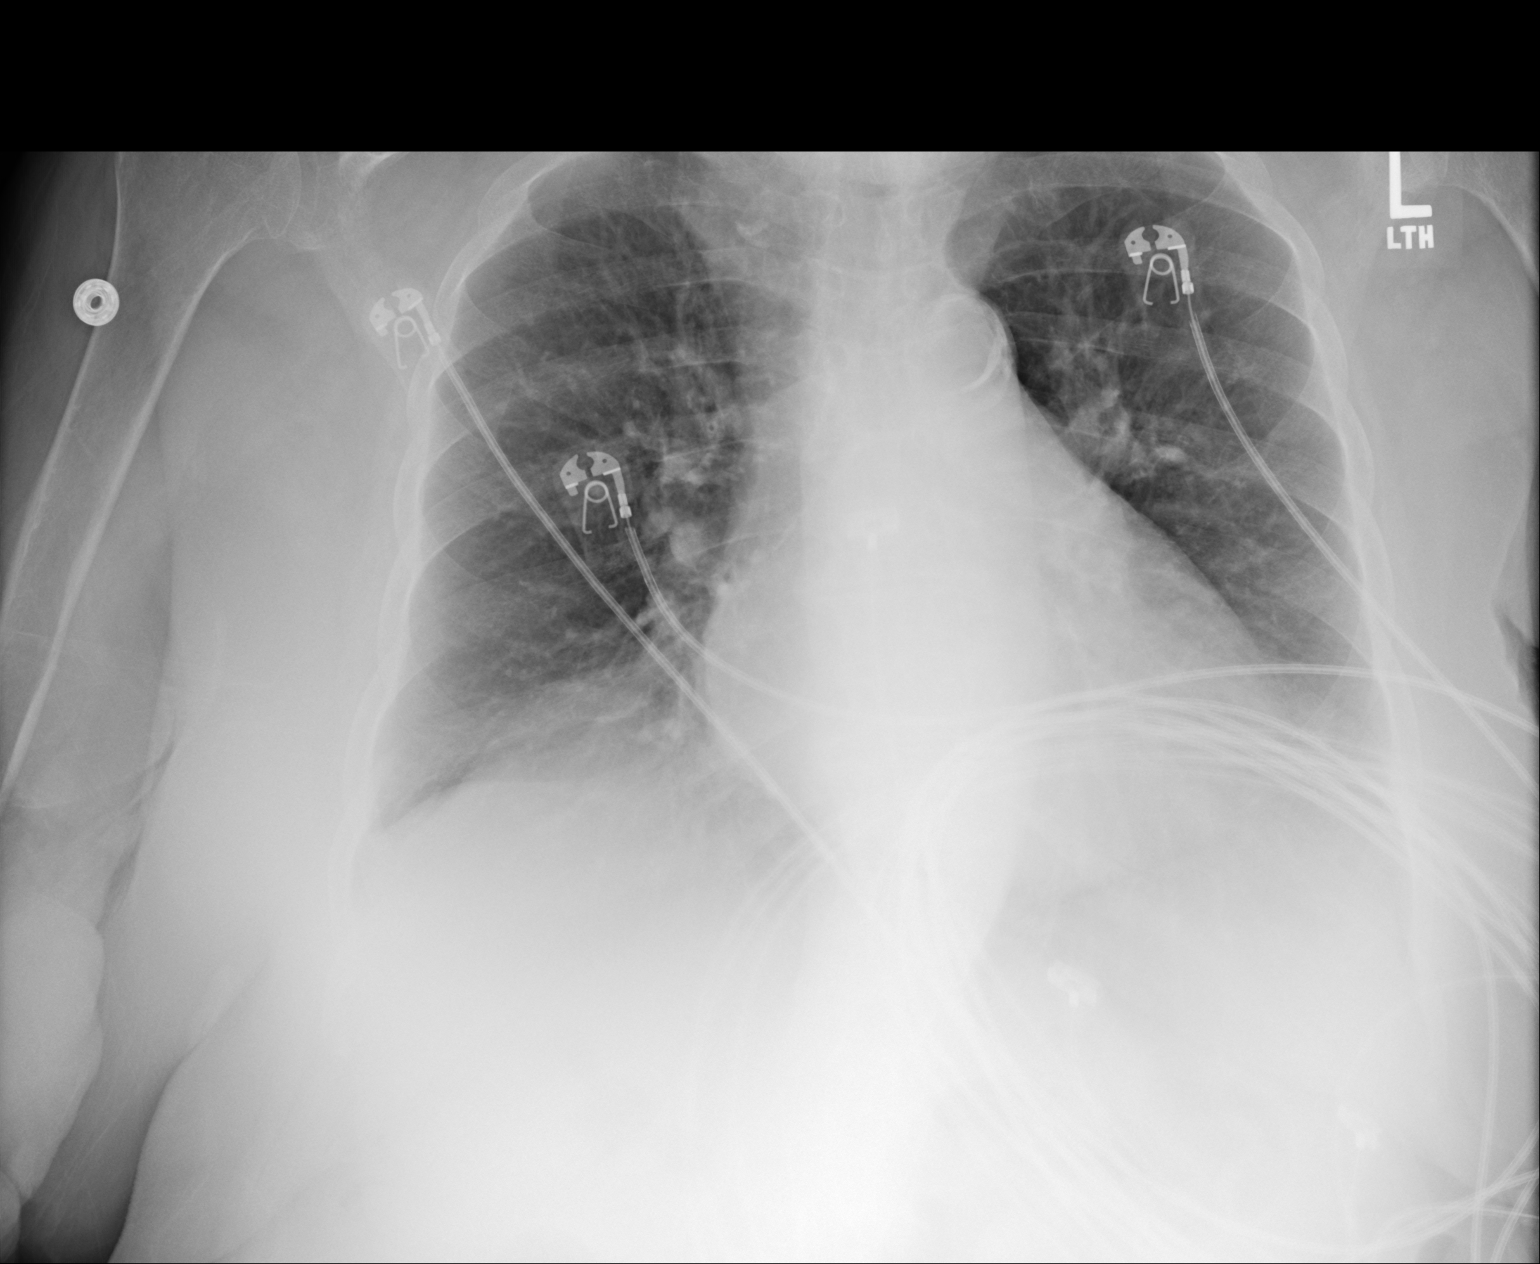

[1 of 1 positions shown; findings below may reference images not displayed]

FINDINGS: Mild cardiomegaly with atherosclerosis. Slight central congestion.
Mild bibasilar atelectasis. No pneumothorax.
IMPRESSION: 1. There is mild bibasilar atelectasis
2. There is cardiomegaly with mild central congestion

## 2018-05-13 NOTE — Progress Notes (Signed)
HPI The patient presents for followup of atrial fibrillation.  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  She was switched to Eliquis.  She was seen once in our office in follow up after that.  She was in the hospital in Dec after a fall and subdural hematoma.  She was off the Eliquis.  At the last visit I talked to neurosurgery and I was given the OK to restart DOAC.   She returns for follow up.    She is doing much better.  Since we last saw her she is had her esophagus stretched and this is helped with some swallowing difficulties.  She is had less extremity pain.  She did have some high blood pressure and she was started on amlodipine at low-dose and her blood pressures are better.  She denies any new shortness of breath, PND or orthopnea.  Said no palpitations, presyncope or syncope.  She gets around with a cane.   Allergies  Allergen Reactions  . Erythromycin Other (See Comments)    Reaction unknown  . Penicillins Other (See Comments)    Reaction unknown  Has patient had a PCN reaction causing immediate rash, facial/tongue/throat swelling, SOB or lightheadedness with hypotension: unknown Has patient had a PCN reaction causing severe rash involving mucus membranes or skin necrosis: unknown Has patient had a PCN reaction that required hospitalization : unknown Has patient had a PCN reaction occurring within the last 10 years: no If all of the above answers are "NO", then may proceed with Cephalosporin use.     Current Outpatient Medications  Medication Sig Dispense Refill  . amLODipine (NORVASC) 2.5 MG tablet Take 1 tablet (2.5 mg total) by mouth daily. 90 tablet 3  . atenolol (TENORMIN) 50 MG tablet Take 1 tablet (50 mg total) by mouth daily. 90 tablet 3  . Benfotiamine 150 MG CAPS Take 150 mg by mouth daily.    . captopril (CAPOTEN) 50 MG tablet TAKE (1) TABLET TWICE A DAY FOR HIGH BLOOD PRESSURE. 60 tablet 5  . ELIQUIS 5  MG TABS tablet Take 1 tablet (5 mg total) by mouth 2 (two) times daily. 60 tablet 2  . ezetimibe (ZETIA) 10 MG tablet TAKE 1 TABLET ONCE DAILY FOR CHOLESTEROL 90 tablet 1  . ferrous sulfate 325 (65 FE) MG tablet Take 325 mg by mouth daily.    . potassium chloride (K-DUR) 10 MEQ tablet TAKE 1 TABLET DAILY 30 tablet 4  . Vitamin D, Ergocalciferol, (DRISDOL) 50000 units CAPS capsule TAKE 1 CAPSULE ONCE A WEEK 12 capsule 0   No current facility-administered medications for this visit.     Past Medical History:  Diagnosis Date  . Adenomatous polyps   . AF (atrial fibrillation) ([place])   . Cataract   . Gallstones   . Hx of adenomatous colonic polyps   . Hyperlipidemia    x5 years  . Hypertension    x5 years  . Kyphosis   . Metabolic syndrome X   . Obesity   . Osteoporosis   . Stroke ([place]) 07/2017  . Subdural hematoma ([place])   . Symptomatic menopausal or female climacteric states   . Vitamin D deficiency     Past Surgical History:  Procedure Laterality Date  . ABDOMINAL HYSTERECTOMY    . BLADDER SUSPENSION    . COLONOSCOPY  multiple  . ESOPHAGEAL DILATION N/A 03/03/2018   Procedure: ESOPHAGEAL DILATION;  Surgeon: Rogene Houston,  MD;  Location: [facility];  Service: Endoscopy;  Laterality: N/A;  . ESOPHAGOGASTRODUODENOSCOPY N/A 03/03/2018   Procedure: ESOPHAGOGASTRODUODENOSCOPY (EGD);  Surgeon: Rogene Houston, MD;  Location: [facility];  Service: Endoscopy;  Laterality: N/A;  . EYE SURGERY Bilateral 09/2015  . VAGINAL HYSTERECTOMY     total    ROS:  As stated in the HPI and negative for all other systems..  PHYSICAL EXAM BP (!) 142/80   Pulse 83   Ht 5\' 1"  (1.549 m)   Wt 153 lb (69.4 kg)   BMI 28.91 kg/m   GENERAL:  Well appearing NECK:  No jugular venous distention, waveform within normal limits, carotid upstroke brisk and symmetric, no bruits, no thyromegaly LUNGS:  Clear to auscultation bilaterally CHEST:  Unremarkable HEART:  PMI not displaced or sustained,S1  and S2 within normal limits, no S3, no clicks, no rubs, no murmurs, irregular ABD:  Flat, positive bowel sounds normal in frequency in pitch, no bruits, no rebound, no guarding, no midline pulsatile mass, no hepatomegaly, no splenomegaly EXT:  2 plus pulses throughout, no edema, no cyanosis no clubbing   EKG: Atrial fibrillation, rate 83, axis within normal limits, intervals within normal limits, inferolateral T wave changes unchanged from previous.  05/14/2018    ASSESSMENT AND PLAN  ATRIAL FIBRILLATION:  Ms. ROYALTI SCHAUF has a CHA2DS2 - VASc score of 5.  Tolerates anticoagulation which was restarted after subdural hematoma.  She does not notice the rhythm.  No change in therapy.  She is had good rate control.  HTN:   I reviewed her blood pressure diaries.  They are improved though occasionally she has systolics in the 277A she also has systolics in the 128N.  I will make no change.     FOOT PAIN:   She is to have conservative management.  She did better with benfotiamine.   No change in therapy.  Well and wonders that he

## 2018-05-14 ENCOUNTER — Encounter: Payer: Self-pay | Admitting: Cardiology

## 2018-05-14 ENCOUNTER — Ambulatory Visit (INDEPENDENT_AMBULATORY_CARE_PROVIDER_SITE_OTHER): Payer: Medicare Other | Admitting: Cardiology

## 2018-05-14 VITALS — BP 142/80 | HR 83 | Ht 61.0 in | Wt 153.0 lb

## 2018-05-14 DIAGNOSIS — I6340 Cerebral infarction due to embolism of unspecified cerebral artery: Secondary | ICD-10-CM

## 2018-05-14 DIAGNOSIS — I10 Essential (primary) hypertension: Secondary | ICD-10-CM | POA: Diagnosis not present

## 2018-05-14 DIAGNOSIS — I482 Chronic atrial fibrillation, unspecified: Secondary | ICD-10-CM

## 2018-05-14 NOTE — Patient Instructions (Signed)

## 2018-05-22 ENCOUNTER — Telehealth: Payer: Self-pay | Admitting: Family Medicine

## 2018-05-27 ENCOUNTER — Encounter: Payer: Medicare Other | Admitting: *Deleted

## 2018-06-23 ENCOUNTER — Other Ambulatory Visit: Payer: Self-pay | Admitting: Family Medicine

## 2018-06-23 ENCOUNTER — Encounter: Payer: Medicare Other | Admitting: *Deleted

## 2018-06-24 ENCOUNTER — Encounter: Payer: Medicare Other | Admitting: *Deleted

## 2018-06-24 NOTE — Telephone Encounter (Signed)
Last Vit D 04/14/18  46.6

## 2018-06-27 ENCOUNTER — Other Ambulatory Visit: Payer: Self-pay | Admitting: Family Medicine

## 2018-07-01 ENCOUNTER — Ambulatory Visit (INDEPENDENT_AMBULATORY_CARE_PROVIDER_SITE_OTHER): Payer: Medicare Other | Admitting: *Deleted

## 2018-07-01 ENCOUNTER — Encounter: Payer: Self-pay | Admitting: *Deleted

## 2018-07-01 VITALS — BP 158/84 | HR 77 | Ht 61.0 in | Wt 153.0 lb

## 2018-07-01 DIAGNOSIS — Z23 Encounter for immunization: Secondary | ICD-10-CM

## 2018-07-01 DIAGNOSIS — Z0000 Encounter for general adult medical examination without abnormal findings: Secondary | ICD-10-CM

## 2018-07-01 NOTE — Progress Notes (Addendum)
Subjective:   Kari [name] is a 84 y.o. female who presents for Medicare Annual (Subsequent) preventive examination.  Kari [name] is accompanied today by her daughter.  She enjoys attending church, watching sports on television, and spending time with her family.  She has a son and daughter who are very active in her medical care and management.  She also has 2 grandchildren and 3 great grandchildren.  Kari [name] lives at home with her husband and adult son.  She feels her health this year is unchanged from last year.  She did have a stroke in 07/2017 and then a fall in 08/2017 which caused a subdural hematoma.  Her daughter describes some left sided weakness and balance issues as residual effects of the stroke.  She states she walks with a cane because her family is insistent.  She reports one ER visit, one hospitalization, and no surgeries in the past year.   Review of Systems:   All negative today  Cardiac Risk Factors include: advanced age (>90men, >64 women);dyslipidemia;hypertension     Objective:     Vitals: BP (!) 158/84   Pulse 77   Ht 5\' 1"  (1.549 m)   Wt 153 lb (69.4 kg)   BMI 28.91 kg/m   Body mass index is 28.91 kg/m.  Advanced Directives 07/01/2018 03/03/2018 08/25/2017 02/27/2017 12/06/2016 09/16/2016 09/09/2016  Does Patient Have a Medical Advance Directive? No No No No No No No  Type of Advance Directive - - - - - - -  Copy of Healthcare Power of Attorney in Chart? - - - - - - -  Would patient like information on creating a medical advance directive? Yes (MAU/Ambulatory/Procedural Areas - Information given) No - Patient declined No - Patient declined No - Patient declined No - Patient declined - No - Patient declined  Pre-existing out of facility DNR order (yellow form or pink MOST form) - - - - - - -    Tobacco Social History   Tobacco Use  Smoking Status Never Smoker  Smokeless Tobacco Never Used     Counseling given: No   Clinical Intake:     Pain Score:  0-No pain                 Past Medical History:  Diagnosis Date  . Adenomatous polyps   . AF (atrial fibrillation) ([place])   . Cataract   . Gallstones   . Hx of adenomatous colonic polyps   . Hyperlipidemia    x5 years  . Hypertension    x5 years  . Kyphosis   . Metabolic syndrome X   . Obesity   . Osteoporosis   . Stroke ([place]) 07/2017  . Subdural hematoma ([place])   . Symptomatic menopausal or female climacteric states   . Vitamin D deficiency    Past Surgical History:  Procedure Laterality Date  . ABDOMINAL HYSTERECTOMY    . BLADDER SUSPENSION    . COLONOSCOPY  multiple  . ESOPHAGEAL DILATION N/A 03/03/2018   Procedure: ESOPHAGEAL DILATION;  Surgeon: Rogene Houston, MD;  [facility];  Service: Endoscopy;  Laterality: N/A;  . ESOPHAGOGASTRODUODENOSCOPY N/A 03/03/2018   Procedure: ESOPHAGOGASTRODUODENOSCOPY (EGD);  Surgeon: Rogene Houston, MD;  Location: [facility];  Service: Endoscopy;  Laterality: N/A;  . EYE SURGERY Bilateral 09/2015  . VAGINAL HYSTERECTOMY     total   Family History  Problem Relation Age of Onset  . Heart attack Mother   . Stroke Mother   .  Heart attack Father   . Alcohol abuse Father   . Hypertension Sister   . Stroke Sister   . Stroke Brother   . Hypertension Brother   . Heart attack Brother   . Hypertension Brother   . Depression Brother        suicide  . Hypertension Sister   . Heart attack Other   . Stroke Other   . Breast cancer Other   . Colon cancer Neg Hx    Social History   Socioeconomic History  . Marital status: Married    Spouse name: Not on file  . Number of children: 2  . Years of education: Not on file  . Highest education level: Not on file  Occupational History  . Occupation: [employer]  . Financial resource strain: Not on file  . Food insecurity:    Worry: Not on file    Inability: Not on file  . Transportation needs:    Medical: Not on file    Non-medical: Not on file    Tobacco Use  . Smoking status: Never Smoker  . Smokeless tobacco: Never Used  Substance and Sexual Activity  . Alcohol use: No  . Drug use: No  . Sexual activity: Not on file  Lifestyle  . Physical activity:    Days per week: Not on file    Minutes per session: Not on file  . Stress: Not on file  Relationships  . Social connections:    Talks on phone: Not on file    Gets together: Not on file    Attends religious service: Not on file    Active member of club or organization: Not on file    Attends meetings of clubs or organizations: Not on file    Relationship status: Not on file  Other Topics Concern  . Not on file  Social History Narrative   Lives with her husband and son, independent   1 son Surveyor, minerals)   1 Daughter Contractor)   2 grandchildren and 3 great-grandchild   12th grade education    Outpatient Encounter Medications as of 07/01/2018  Medication Sig  . amLODipine (NORVASC) 2.5 MG tablet Take 1 tablet (2.5 mg total) by mouth daily.  Marland Kitchen atenolol (TENORMIN) 50 MG tablet Take 1 tablet (50 mg total) by mouth daily.  . Benfotiamine 150 MG CAPS Take 150 mg by mouth daily.  . captopril (CAPOTEN) 50 MG tablet TAKE (1) TABLET TWICE A DAY FOR HIGH BLOOD PRESSURE.  Marland Kitchen ELIQUIS 5 MG TABS tablet Take 1 tablet (5 mg total) by mouth 2 (two) times daily.  Marland Kitchen ezetimibe (ZETIA) 10 MG tablet TAKE 1 TABLET ONCE DAILY FOR CHOLESTEROL  . ferrous sulfate 325 (65 FE) MG tablet Take 325 mg by mouth daily.  . potassium chloride (K-DUR) 10 MEQ tablet TAKE 1 TABLET DAILY  . Vitamin D, Ergocalciferol, (DRISDOL) 50000 units CAPS capsule TAKE 1 CAPSULE ONCE A WEEK   No facility-administered encounter medications on file as of 07/01/2018.     Activities of Daily Living In your present state of health, do you have any difficulty performing the following activities: 07/01/2018 08/25/2017  Hearing? Y Kari [name]  Comment daughter feels patient has decreased hearing, not interested in audiology  referral -  Vision? N N  Difficulty concentrating or making decisions? Y N  Comment Family helps with decision making -  Walking or climbing stairs? Y N  Comment Issues with balance, uses cane -  Dressing or bathing? N  N  Doing errands, shopping? Y N  Comment family provides transportation -  Conservation officer, nature and eating ? N -  Using the Toilet? N -  In the past six months, have you accidently leaked urine? N -  Do you have problems with loss of bowel control? N -  Managing your Medications? Y -  Comment Son manages medications -  Managing your Finances? N -  Comment Manages with husband -  Housekeeping or managing your Housekeeping? Y -  Comment Son helps with housework -  Some recent data might be hidden    Patient Care Team: Chipper Herb, MD as PCP - General (Family Medicine) Minus Breeding, MD as Consulting Physician (Cardiology) Gatha Mayer, MD as Consulting Physician (Gastroenterology)  Penni Bombard, MD Neurology     Assessment:   This is a routine wellness examination for Kari [name].  Exercise Activities and Dietary recommendations  Patient and daughter provided diet history- she eats 3 meals per day, and if she does not eat well at a meal she supplements with Ensure shakes.  Her son does all of the cooking at home, she usually eats at home for breakfast and lunch, and goes out for supper 2-3 times per week.  Breakfast is normally eggs, grits, and toast or cereal, lunch is usually a sandwich, and supper is usually vegetables and sometimes a meat.  Patient does not enjoy eating very much meat she states.  Recommended diet of mostly vegetables and lean proteins, and fruits and whole grains in moderation.     Current Exercise Habits: The patient does not participate in regular exercise at present, Exercise limited by: neurologic condition(s)  Goals    . DIET - INCREASE WATER INTAKE     Drink at least 48 -64 oz of water per day.        Fall Risk Fall Risk   07/01/2018 04/14/2018 02/19/2018 01/20/2018 01/07/2018  Falls in the past year? Yes Yes Yes Yes No  Number falls in past yr: 1 1 1 1  -  Comment Subdural hematoma  - - - -  Injury with Fall? Yes Yes No No -  Comment - - - - -  Risk Factor Category  High Fall Risk - - - -  Risk for fall due to : Impaired balance/gait;History of fall(s) - - - -  Risk for fall due to: Comment - - - - -  Follow up Education provided;Falls prevention discussed - - - -   Is the patient's home free of loose throw rugs in walkways, pet beds, electrical cords, etc?   yes      Grab bars in the bathroom? yes      Handrails on the stairs?   yes, patient does not use stairs in her home but they have railings      Adequate lighting?   yes    Depression Screen PHQ 2/9 Scores 07/01/2018 04/14/2018 02/19/2018 01/20/2018  PHQ - 2 Score 0 0 0 0     Cognitive Function MMSE - Mini Mental State Exam 07/01/2018 02/27/2017 02/08/2016 02/02/2015  Orientation to time 4 5 5 5   Orientation to Place 4 5 5 5   Registration 3 3 3 3   Attention/ Calculation 5 5 4 5   Recall 0 2 2 3   Language- name 2 objects 2 2 2 2   Language- repeat 1 1 1  -  Language- follow 3 step command 3 3 3 3   Language- read & follow direction 1 1 1  1  Write a sentence 1 1 1 1   Copy design 0 1 1 1   Total score 24 29 28  -      Patient has a significant decrease in MMSE score compared to 02/27/17.  Will make sure Dr. Laurance Flatten is aware. She is also follow by neurology.     Immunization History  Administered Date(s) Administered  . Influenza Split 06/18/2015  . Influenza Whole 05/04/2010  . Influenza, High Dose Seasonal PF 05/25/2016, 06/03/2017  . Influenza,inj,Quad PF,6+ Mos 05/28/2013, 06/21/2014, 06/08/2015  . Influenza-Unspecified 06/03/2017  . Pneumococcal Conjugate-13 01/06/2014  . Pneumococcal Polysaccharide-23 10/05/1999, 02/02/2015  . Td 10/05/2007  . Tdap 05/05/2011   High dose flu vaccine given today.  Qualifies for Shingles Vaccine? Yes, declined  today  Screening Tests Health Maintenance  Topic Date Due  . INFLUENZA VACCINE  04/03/2018  . COLONOSCOPY  10/29/2018 (Originally 02/26/2017)  . MAMMOGRAM  10/02/2019 (Originally 05/04/2014)  . DEXA SCAN  04/04/2019  . TETANUS/TDAP  05/04/2021  . PNA vac Low Risk Adult  Completed   Flu vaccine given today.  Cancer Screenings: Lung: Low Dose CT Chest recommended if Age 80-80 years, 30 pack-year currently smoking OR have quit w/in 15years. Patient does not qualify. Breast:  Up to date on Mammogram? No, patient declined Up to date of Bone Density/Dexa? Yes Colorectal: No, patient declined  Additional Screenings:  Hepatitis C Screening:  Not indicated     Plan:     Work on your goal of drinking 48-64 oz of water per day.  Review the information given on Advance Directives, and if you complete the paperwork please bring a copy to our office to be filed in your medical record. Follow up with Dr. Laurance Flatten as scheduled.    I have personally reviewed and noted the following in the patient's chart:   . Medical and social history . Use of alcohol, tobacco or illicit drugs  . Current medications and supplements . Functional ability and status . Nutritional status . Physical activity . Advanced directives . List of other physicians . Hospitalizations, surgeries, and ER visits in previous 12 months . Vitals . Screenings to include cognitive, depression, and falls . Referrals and appointments  In addition, I have reviewed and discussed with patient certain preventive protocols, quality metrics, and best practice recommendations. A written personalized care plan for preventive services as well as general preventive health recommendations were provided to patient.     WYATT, AMY M, RN  07/01/2018   I have reviewed and agree with the above AWV documentation.   Mary-Margaret Hassell Done, FNP

## 2018-07-01 NOTE — Patient Instructions (Addendum)
Please work on your goal of drinking 48-64 oz of water per day.   Please review the information given on Advance Directives, and if you complete the paperwork please bring a copy to our office to be filed in your medical record.  Please follow up with Dr. Laurance Flatten as scheduled.    Thank you for coming in for your Annual Wellness Visit today!!   Preventive Care 65 Years and Older, Female Preventive care refers to lifestyle choices and visits with your health care provider that can promote health and wellness. What does preventive care include?  A yearly physical exam. This is also called an annual well check.  Dental exams once or twice a year.  Routine eye exams. Ask your health care provider how often you should have your eyes checked.  Personal lifestyle choices, including: ? Daily care of your teeth and gums. ? Regular physical activity. ? Eating a healthy diet. ? Avoiding tobacco and drug use. ? Limiting alcohol use. ? Practicing safe sex. ? Taking low-dose aspirin every day. ? Taking vitamin and mineral supplements as recommended by your health care provider. What happens during an annual well check? The services and screenings done by your health care provider during your annual well check will depend on your age, overall health, lifestyle risk factors, and family history of disease. Counseling Your health care provider may ask you questions about your:  Alcohol use.  Tobacco use.  Drug use.  Emotional well-being.  Home and relationship well-being.  Sexual activity.  Eating habits.  History of falls.  Memory and ability to understand (cognition).  Work and work Statistician.  Reproductive health.  Screening You may have the following tests or measurements:  Height, weight, and BMI.  Blood pressure.  Lipid and cholesterol levels. These may be checked every 5 years, or more frequently if you are over 97 years old.  Skin check.  Lung cancer screening.  You may have this screening every year starting at age 44 if you have a 30-pack-year history of smoking and currently smoke or have quit within the past 15 years.  Fecal occult blood test (FOBT) of the stool. You may have this test every year starting at age 38.  Flexible sigmoidoscopy or colonoscopy. You may have a sigmoidoscopy every 5 years or a colonoscopy every 10 years starting at age 63.  Hepatitis C blood test.  Hepatitis B blood test.  Sexually transmitted disease (STD) testing.  Diabetes screening. This is done by checking your blood sugar (glucose) after you have not eaten for a while (fasting). You may have this done every 1-3 years.  Bone density scan. This is done to screen for osteoporosis. You may have this done starting at age 62.  Mammogram. This may be done every 1-2 years. Talk to your health care provider about how often you should have regular mammograms.  Talk with your health care provider about your test results, treatment options, and if necessary, the need for more tests. Vaccines Your health care provider may recommend certain vaccines, such as:  Influenza vaccine. This is recommended every year.  Tetanus, diphtheria, and acellular pertussis (Tdap, Td) vaccine. You may need a Td booster every 10 years.  Varicella vaccine. You may need this if you have not been vaccinated.  Zoster vaccine. You may need this after age 64.  Measles, mumps, and rubella (MMR) vaccine. You may need at least one dose of MMR if you were born in 1957 or later. You may also need  a second dose.  Pneumococcal 13-valent conjugate (PCV13) vaccine. One dose is recommended after age 53.  Pneumococcal polysaccharide (PPSV23) vaccine. One dose is recommended after age 85.  Meningococcal vaccine. You may need this if you have certain conditions.  Hepatitis A vaccine. You may need this if you have certain conditions or if you travel or work in places where you may be exposed to hepatitis  A.  Hepatitis B vaccine. You may need this if you have certain conditions or if you travel or work in places where you may be exposed to hepatitis B.  Haemophilus influenzae type b (Hib) vaccine. You may need this if you have certain conditions.  Talk to your health care provider about which screenings and vaccines you need and how often you need them. This information is not intended to replace advice given to you by your health care provider. Make sure you discuss any questions you have with your health care provider. Document Released: 09/16/2015 Document Revised: 05/09/2016 Document Reviewed: 06/21/2015 Elsevier Interactive Patient Education  2018 [place] in the Home Falls can cause injuries. They can happen to people of all ages. There are many things you can do to make your home safe and to help prevent falls. What can I do on the outside of my home?  Regularly fix the edges of walkways and driveways and fix any cracks.  Remove anything that might make you trip as you walk through a door, such as a raised step or threshold.  Trim any bushes or trees on the path to your home.  Use bright outdoor lighting.  Clear any walking paths of anything that might make someone trip, such as rocks or tools.  Regularly check to see if handrails are loose or broken. Make sure that both sides of any steps have handrails.  Any raised decks and porches should have guardrails on the edges.  Have any leaves, snow, or ice cleared regularly.  Use sand or salt on walking paths during winter.  Clean up any spills in your garage right away. This includes oil or grease spills. What can I do in the bathroom?  Use night lights.  Install grab bars by the toilet and in the tub and shower. Do not use towel bars as grab bars.  Use non-skid mats or decals in the tub or shower.  If you need to sit down in the shower, use a plastic, non-slip stool.  Keep the floor dry. Clean  up any water that spills on the floor as soon as it happens.  Remove soap buildup in the tub or shower regularly.  Attach bath mats securely with double-sided non-slip rug tape.  Do not have throw rugs and other things on the floor that can make you trip. What can I do in the bedroom?  Use night lights.  Make sure that you have a light by your bed that is easy to reach.  Do not use any sheets or blankets that are too big for your bed. They should not hang down onto the floor.  Have a firm chair that has side arms. You can use this for support while you get dressed.  Do not have throw rugs and other things on the floor that can make you trip. What can I do in the kitchen?  Clean up any spills right away.  Avoid walking on wet floors.  Keep items that you use a lot in easy-to-reach places.  If you need to  reach something above you, use a strong step stool that has a grab bar.  Keep electrical cords out of the way.  Do not use floor polish or wax that makes floors slippery. If you must use wax, use non-skid floor wax.  Do not have throw rugs and other things on the floor that can make you trip. What can I do with my stairs?  Do not leave any items on the stairs.  Make sure that there are handrails on both sides of the stairs and use them. Fix handrails that are broken or loose. Make sure that handrails are as long as the stairways.  Check any carpeting to make sure that it is firmly attached to the stairs. Fix any carpet that is loose or worn.  Avoid having throw rugs at the top or bottom of the stairs. If you do have throw rugs, attach them to the floor with carpet tape.  Make sure that you have a light switch at the top of the stairs and the bottom of the stairs. If you do not have them, ask someone to add them for you. What else can I do to help prevent falls?  Wear shoes that: ? Do not have high heels. ? Have rubber bottoms. ? Are comfortable and fit you well. ? Are  closed at the toe. Do not wear sandals.  If you use a stepladder: ? Make sure that it is fully opened. Do not climb a closed stepladder. ? Make sure that both sides of the stepladder are locked into place. ? Ask someone to hold it for you, if possible.  Clearly mark and make sure that you can see: ? Any grab bars or handrails. ? First and last steps. ? Where the edge of each step is.  Use tools that help you move around (mobility aids) if they are needed. These include: ? Canes. ? Walkers. ? Scooters. ? Crutches.  Turn on the lights when you go into a dark area. Replace any light bulbs as soon as they burn out.  Set up your furniture so you have a clear path. Avoid moving your furniture around.  If any of your floors are uneven, fix them.  If there are any pets around you, be aware of where they are.  Review your medicines with your doctor. Some medicines can make you feel dizzy. This can increase your chance of falling. Ask your doctor what other things that you can do to help prevent falls. This information is not intended to replace advice given to you by your health care provider. Make sure you discuss any questions you have with your health care provider. Document Released: 06/16/2009 Document Revised: 01/26/2016 Document Reviewed: 09/24/2014 Elsevier Interactive Patient Education  [employer].

## 2018-07-02 ENCOUNTER — Telehealth: Payer: Self-pay | Admitting: *Deleted

## 2018-07-02 NOTE — Telephone Encounter (Signed)
Left voicemail to return my call regarding if she is on any medication for memory loss.

## 2018-07-02 NOTE — Telephone Encounter (Signed)
-----   Message from Chipper Herb, MD sent at 07/01/2018  5:37 PM EDT ----- Regarding: RE: Is she on anything for her memory? ----- Message ----- From: Denyce Robert, RN Sent: 07/01/2018   1:51 PM EDT To: Chipper Herb, MD  Just an 81- Mrs. Goens MMSE score was a 24 today, last time it was checked 02/27/17 it was 29, but I know she has had a stroke since then.  She is followed by neurology, but just wanted to let you know.

## 2018-07-04 NOTE — Telephone Encounter (Signed)
Spoke with [name] - she will discuss the Aricept with family and let us know what they decide.

## 2018-07-04 NOTE — Telephone Encounter (Signed)
-----   Message from Chipper Herb, MD sent at 07/02/2018  7:30 AM EDT ----- Have discussion with family, will consider starting her on Aricept. ----- Message ----- From: Denyce Robert, RN Sent: 07/01/2018   1:51 PM EDT To: Chipper Herb, MD  Just an 34- Mrs. Athanas MMSE score was a 24 today, last time it was checked 02/27/17 it was 29, but I know she has had a stroke since then.  She is followed by neurology, but just wanted to let you know.

## 2018-07-07 ENCOUNTER — Other Ambulatory Visit: Payer: Self-pay | Admitting: Family Medicine

## 2018-07-08 DIAGNOSIS — B351 Tinea unguium: Secondary | ICD-10-CM | POA: Diagnosis not present

## 2018-07-08 DIAGNOSIS — I70203 Unspecified atherosclerosis of native arteries of extremities, bilateral legs: Secondary | ICD-10-CM | POA: Diagnosis not present

## 2018-07-08 DIAGNOSIS — L84 Corns and callosities: Secondary | ICD-10-CM | POA: Diagnosis not present

## 2018-07-08 DIAGNOSIS — M79676 Pain in unspecified toe(s): Secondary | ICD-10-CM | POA: Diagnosis not present

## 2018-07-17 ENCOUNTER — Telehealth: Payer: Self-pay | Admitting: Family Medicine

## 2018-07-17 MED ORDER — DONEPEZIL HCL 5 MG PO TABS: 5.0000 mg | ORAL_TABLET | Freq: Every day | ORAL | 1 refills | Status: DC

## 2018-07-17 NOTE — Telephone Encounter (Signed)
Spoke with [name] = med sent in

## 2018-07-17 NOTE — Telephone Encounter (Signed)
Start Aricept 5 mg 1 daily with food and if patient does well and tolerates well increase this to 10 mg after 1 month.  Family should call back at that time and the decision will be made to call in a new prescription for the stronger strength.

## 2018-07-17 NOTE — Telephone Encounter (Signed)
Patients family is in agreement for Kari [name] to start taking the Aricept.

## 2018-07-21 ENCOUNTER — Other Ambulatory Visit: Payer: Self-pay | Admitting: Family Medicine

## 2018-07-29 ENCOUNTER — Ambulatory Visit (INDEPENDENT_AMBULATORY_CARE_PROVIDER_SITE_OTHER): Payer: Medicare Other | Admitting: Cardiovascular Disease

## 2018-07-29 ENCOUNTER — Encounter: Payer: Self-pay | Admitting: Cardiovascular Disease

## 2018-07-29 VITALS — BP 142/80 | HR 78 | Ht 61.5 in | Wt 153.6 lb

## 2018-07-29 DIAGNOSIS — I739 Peripheral vascular disease, unspecified: Secondary | ICD-10-CM | POA: Diagnosis not present

## 2018-07-29 DIAGNOSIS — I6340 Cerebral infarction due to embolism of unspecified cerebral artery: Secondary | ICD-10-CM

## 2018-07-29 DIAGNOSIS — I482 Chronic atrial fibrillation, unspecified: Secondary | ICD-10-CM | POA: Diagnosis not present

## 2018-07-29 DIAGNOSIS — E785 Hyperlipidemia, unspecified: Secondary | ICD-10-CM | POA: Diagnosis not present

## 2018-07-29 NOTE — Patient Instructions (Signed)
Medication Instructions:  Your physician recommends that you continue on your current medications as directed. Please refer to the Current Medication list given to you today.  If you need a refill on your cardiac medications before your next appointment, please call your pharmacy.   Lab work: none If you have labs (blood work) drawn today and your tests are completely normal, you will receive your results only by: Marland Kitchen MyChart Message (if you have MyChart) OR . A paper copy in the mail If you have any lab test that is abnormal or we need to change your treatment, we will call you to review the results.  Testing/Procedures: none  Follow-Up: At [facility], you and your health needs are our priority.  As part of our continuing mission to provide you with exceptional heart care, we have created designated Provider Care Teams.  These Care Teams include your primary Cardiologist (physician) and Advanced Practice Providers (APPs -  Physician Assistants and Nurse Practitioners) who all work together to provide you with the care you need, when you need it. . Follow up with Dr. Fletcher Anon as needed. .   Any Other Special Instructions Will Be Listed Below (If Applicable).

## 2018-07-29 NOTE — Progress Notes (Signed)
Cardiology Office Note   Date:  07/29/2018   ID:  Kari [name], DOB [birth year], MRN [id number]  PCP:  Chipper Herb, MD  Cardiologist:  Dr. Percival Spanish  No chief complaint on file.     History of Present Illness: Kari [name] is a 84 y.o. female who is here today for follow-up visit regarding peripheral arterial disease .w She has known history of atrial fibrillation, previous stroke while she is on warfarin, essential hypertension and hyperlipidemia.  She is not a smoker and has no history of diabetes.   She has known history of peripheral arterial disease with ABI in the 0.5 range with bilateral occlusion of popliteal artery.  Given minimal claudication and no critical limb ischemia.  She has been treated medically. She reports no change in symptoms overall since last year.  She denies calf or foot pain with walking.  She walks with a cane.  No chest pain or shortness of breath.  Past Medical History:  Diagnosis Date  . Adenomatous polyps   . AF (atrial fibrillation) ([place])   . Cataract   . Gallstones   . Hx of adenomatous colonic polyps   . Hyperlipidemia    x5 years  . Hypertension    x5 years  . Kyphosis   . Metabolic syndrome X   . Obesity   . Osteoporosis   . Stroke ([place]) 07/2017  . Subdural hematoma ([place])   . Symptomatic menopausal or female climacteric states   . Vitamin D deficiency     Past Surgical History:  Procedure Laterality Date  . ABDOMINAL HYSTERECTOMY    . BLADDER SUSPENSION    . COLONOSCOPY  multiple  . ESOPHAGEAL DILATION N/A 03/03/2018   Procedure: ESOPHAGEAL DILATION;  Surgeon: Rogene Houston, MD;  [facility];  Service: Endoscopy;  Laterality: N/A;  . ESOPHAGOGASTRODUODENOSCOPY N/A 03/03/2018   Procedure: ESOPHAGOGASTRODUODENOSCOPY (EGD);  Surgeon: Rogene Houston, MD;  Location: [facility];  Service: Endoscopy;  Laterality: N/A;  . EYE SURGERY Bilateral 09/2015  . VAGINAL HYSTERECTOMY     total     Current  Outpatient Medications  Medication Sig Dispense Refill  . amLODipine (NORVASC) 2.5 MG tablet Take 1 tablet (2.5 mg total) by mouth daily. 90 tablet 3  . atenolol (TENORMIN) 50 MG tablet Take 1 tablet (50 mg total) by mouth daily. 90 tablet 3  . Benfotiamine 150 MG CAPS Take 150 mg by mouth daily.    . captopril (CAPOTEN) 50 MG tablet TAKE (1) TABLET TWICE A DAY FOR HIGH BLOOD PRESSURE. 60 tablet 2  . donepezil (ARICEPT) 5 MG tablet Take 1 tablet (5 mg total) by mouth at bedtime. 30 tablet 1  . ELIQUIS 5 MG TABS tablet Take 1 tablet (5 mg total) by mouth 2 (two) times daily. 60 tablet 11  . ezetimibe (ZETIA) 10 MG tablet TAKE 1 TABLET ONCE DAILY FOR CHOLESTEROL 90 tablet 1  . ferrous sulfate 325 (65 FE) MG tablet Take 325 mg by mouth daily.    . potassium chloride (K-DUR) 10 MEQ tablet TAKE 1 TABLET DAILY 30 tablet 4  . pravastatin (PRAVACHOL) 80 MG tablet TAKE 1 TABLET ONCE DAILY FOR CHOLESTEROL 90 tablet 0  . Vitamin D, Ergocalciferol, (DRISDOL) 50000 units CAPS capsule TAKE 1 CAPSULE ONCE A WEEK 12 capsule 0   No current facility-administered medications for this visit.     Allergies:   Erythromycin and Penicillins    Social History:  The patient  reports  that she has never smoked. She has never used smokeless tobacco. She reports that she does not drink alcohol or use drugs.   Family History:  The patient's family history includes Alcohol abuse in her father; Breast cancer in her other; Depression in her brother; Heart attack in her brother, father, mother, and other; Hypertension in her brother, brother, sister, and sister; Stroke in her brother, mother, other, and sister.    ROS:  Please see the history of present illness.   Otherwise, review of systems are positive for none.   All other systems are reviewed and negative.    PHYSICAL EXAM: VS:  BP (!) 142/80   Pulse 78   Ht 5' 1.5" (1.562 m)   Wt 153 lb 9.6 oz (69.7 kg)   BMI 28.55 kg/m  , BMI Body mass index is 28.55  kg/m. GEN: Well nourished, well developed, in no acute distress  HEENT: normal  Neck: no JVD, carotid bruits, or masses Cardiac: Irregularly irregular; no murmurs, rubs, or gallops,no edema  Respiratory:  clear to auscultation bilaterally, normal work of breathing GI: soft, nontender, nondistended, + BS MS: no deformity or atrophy  Skin: warm and dry, no rash Neuro:  Strength and sensation are intact Psych: euthymic mood, full affect Distal pulses are not palpable.  EKG:  EKG is not ordered today.    Recent Labs: 04/14/2018: ALT 15; BUN 7; Creatinine, Ser 0.55; Hemoglobin 14.3; Platelets 229; Potassium 4.0; Sodium 137    Lipid Panel    Component Value Date/Time   CHOL 183 04/14/2018 1534   CHOL 123 03/25/2013 0938   TRIG 118 04/14/2018 1534   TRIG 93 01/04/2016 0836   TRIG 104 03/25/2013 0938   HDL 40 04/14/2018 1534   HDL 39 (L) 01/04/2016 0836   HDL 41 03/25/2013 0938   CHOLHDL 4.6 (H) 04/14/2018 1534   LDLCALC 119 (H) 04/14/2018 1534   LDLCALC 68 01/06/2014 0940   LDLCALC 61 03/25/2013 0938      Wt Readings from Last 3 Encounters:  07/29/18 153 lb 9.6 oz (69.7 kg)  07/01/18 153 lb (69.4 kg)  05/14/18 153 lb (69.4 kg)      PAD Screen 11/06/2017  Previous PAD dx? No  Previous surgical procedure? No  Pain with walking? No  Feet/toe relief with dangling? No  Painful, non-healing ulcers? No  Extremities discolored? No      ASSESSMENT AND PLAN:  1.  Peripheral arterial disease with minimal bilateral calf claudication due to occluded popliteal artery bilaterally.  No evidence of critical limb ischemia.  Given minimal symptoms, I recommend continuing medical therapy.: She can follow-up with me if symptoms worsen.  2.  Chronic atrial fibrillation, tolerating anticoagulation with Eliquis and ventricular rate is controlled.  3.  Hyperlipidemia: Currently on pravastatin and Zetia.    Disposition:   FU with me as needed  Signed,  Kathlyn Sacramento, MD   07/29/2018 5:42 PM    [place]

## 2018-08-08 ENCOUNTER — Other Ambulatory Visit: Payer: Self-pay | Admitting: Family Medicine

## 2018-08-18 ENCOUNTER — Encounter: Payer: Self-pay | Admitting: Family Medicine

## 2018-08-18 ENCOUNTER — Ambulatory Visit (INDEPENDENT_AMBULATORY_CARE_PROVIDER_SITE_OTHER): Payer: Medicare Other | Admitting: Family Medicine

## 2018-08-18 VITALS — BP 176/87 | HR 80 | Temp 96.7°F | Ht 61.5 in | Wt 153.0 lb

## 2018-08-18 DIAGNOSIS — I639 Cerebral infarction, unspecified: Secondary | ICD-10-CM | POA: Diagnosis not present

## 2018-08-18 DIAGNOSIS — I700 Atherosclerosis of aorta: Secondary | ICD-10-CM | POA: Diagnosis not present

## 2018-08-18 DIAGNOSIS — I4819 Other persistent atrial fibrillation: Secondary | ICD-10-CM | POA: Diagnosis not present

## 2018-08-18 DIAGNOSIS — I10 Essential (primary) hypertension: Secondary | ICD-10-CM

## 2018-08-18 DIAGNOSIS — K222 Esophageal obstruction: Secondary | ICD-10-CM

## 2018-08-18 DIAGNOSIS — R9389 Abnormal findings on diagnostic imaging of other specified body structures: Secondary | ICD-10-CM | POA: Diagnosis not present

## 2018-08-18 DIAGNOSIS — E7800 Pure hypercholesterolemia, unspecified: Secondary | ICD-10-CM | POA: Diagnosis not present

## 2018-08-18 DIAGNOSIS — I480 Paroxysmal atrial fibrillation: Secondary | ICD-10-CM | POA: Diagnosis not present

## 2018-08-18 DIAGNOSIS — I6930 Unspecified sequelae of cerebral infarction: Secondary | ICD-10-CM | POA: Diagnosis not present

## 2018-08-18 DIAGNOSIS — E559 Vitamin D deficiency, unspecified: Secondary | ICD-10-CM

## 2018-08-18 DIAGNOSIS — N390 Urinary tract infection, site not specified: Secondary | ICD-10-CM

## 2018-08-18 NOTE — Progress Notes (Signed)
Subjective:    Patient ID: Kari [name], female    DOB: [birth year], 84 y.o.   MRN: [id number]  HPI Pt here for follow up and management of chronic medical problems which includes hypertension and hyperlipidemia. She is taking medication regularly.  The patient had a chest CT in August because of pulmonary nodules.  Radiologist recommended a repeat unenhanced CT scan a year from that time.  We will remind the family of this so that this happens next August.  Her most recent CBC was really good with a hemoglobin at 14.3.  This is greatly improved from previously.  The white blood cell count was normal at that time and the platelet count was adequate.  She is getting a repeat CBC today.  She is still taking iron according to the record.  If the hemoglobin is higher we may have to discontinue this.  Blood pressures were elevated on 2 occasions today.  Her weight is stable.  The patient has a history of a CVA and atrial fibrillation and aortic atherosclerosis.  She also has esophageal dysphagia.  She did have an endoscopy with a severe Schatzki ring which was dilated.  She also has a 2 cm hiatal hernia.  The blood pressure readings at home were good in the 1 10-1 20 range over the 70s according to the daughter and this was done on multiple occasions.  No change in treatment today but they will bring readings by for review.  The patient today with her daughter sitting in the room denies any chest pain pressure tightness or shortness of breath.  She denies any trouble with swallowing now as this is better since she was dilated and has had no change in her bowel habits and is still taking iron pill once daily and we will continue to do that until the CBC results are returned.  She denies any blood in the stool.  The stools are dark.  She denies any heartburn nausea or vomiting.  She is passing her water well.  She is drinking plenty of water and the family encourages her to do this and they actually monitor her  fluid intake.     Patient Active Problem List   Diagnosis Date Noted  . Esophageal dysphagia 01/31/2018  . Pain in both feet 10/03/2017  . SDH (subdural hematoma) ([place]) 08/24/2017  . CVA (cerebral vascular accident) ([place]) 07/29/2017  . Influenza A 12/06/2016  . Sciatica of right side 04/23/2016  . At high risk for falls 04/23/2016  . Increased BMI 09/30/2015  . Hyperlipidemia 09/07/2015  . Low back pain 09/07/2015  . Hypoxia 09/06/2015  . Orthostatic syncope 09/04/2015  . Orthostatic hypotension 09/04/2015  . Aortic atherosclerosis ([place]) 05/03/2015  . Vitamin D deficiency 02/02/2015  . Pre-diabetes 02/02/2015  . Metabolic syndrome 68/11/2120  . Osteoporosis 03/11/2013  . Atrial fibrillation ([place]) 11/24/2012  . Hypokalemia 05/04/2012  . Obesity   . Symptomatic menopausal or female climacteric states   . Kyphosis   . Gallstones   . Personal history of adenomatous colonic polyps   . Essential hypertension 03/29/2009   Outpatient Encounter Medications as of 08/18/2018  Medication Sig  . amLODipine (NORVASC) 2.5 MG tablet Take 1 tablet (2.5 mg total) by mouth daily.  Marland Kitchen atenolol (TENORMIN) 50 MG tablet Take 1 tablet (50 mg total) by mouth daily.  . Benfotiamine 150 MG CAPS Take 150 mg by mouth daily.  . captopril (CAPOTEN) 50 MG tablet TAKE (1) TABLET TWICE A DAY FOR HIGH  BLOOD PRESSURE.  . donepezil (ARICEPT) 5 MG tablet Take 1 tablet (5 mg total) by mouth at bedtime.  Marland Kitchen ELIQUIS 5 MG TABS tablet Take 1 tablet (5 mg total) by mouth 2 (two) times daily.  Marland Kitchen ezetimibe (ZETIA) 10 MG tablet TAKE 1 TABLET ONCE DAILY FOR CHOLESTEROL  . ferrous sulfate 325 (65 FE) MG tablet Take 325 mg by mouth daily.  . potassium chloride (K-DUR) 10 MEQ tablet TAKE 1 TABLET DAILY  . potassium chloride (K-DUR) 10 MEQ tablet TAKE 1 TABLET DAILY  . pravastatin (PRAVACHOL) 80 MG tablet TAKE 1 TABLET ONCE DAILY FOR CHOLESTEROL  . Vitamin D, Ergocalciferol, (DRISDOL) 50000 units CAPS capsule TAKE 1 CAPSULE  ONCE A WEEK   No facility-administered encounter medications on file as of 08/18/2018.      Review of Systems  Constitutional: Negative.   HENT: Negative.   Eyes: Negative.   Respiratory: Negative.   Cardiovascular: Negative.   Gastrointestinal: Negative.   Endocrine: Negative.   Genitourinary: Negative.   Musculoskeletal: Negative.   Skin: Negative.   Allergic/Immunologic: Negative.   Neurological: Negative.   Hematological: Negative.   Psychiatric/Behavioral: Negative.        Objective:   Physical Exam Vitals signs and nursing note reviewed.  Constitutional:      Appearance: Normal appearance. She is well-developed. She is obese.     Comments: Patient is pleasant and alert but did not know my name specifically today.  She is jovial and in good spirits and the family is pleased overall with her current condition.  HENT:     Head: Normocephalic and atraumatic.     Right Ear: Tympanic membrane, ear canal and external ear normal. There is no impacted cerumen.     Left Ear: Tympanic membrane, ear canal and external ear normal. There is no impacted cerumen.     Nose: Nose normal. No congestion.     Mouth/Throat:     Mouth: Mucous membranes are moist.     Pharynx: Oropharynx is clear.  Eyes:     General: No scleral icterus.       Right eye: No discharge.        Left eye: No discharge.     Extraocular Movements: Extraocular movements intact.     Conjunctiva/sclera: Conjunctivae normal.     Pupils: Pupils are equal, round, and reactive to light.  Neck:     Musculoskeletal: Normal range of motion and neck supple.     Thyroid: No thyromegaly.     Vascular: No carotid bruit or JVD.  Cardiovascular:     Rate and Rhythm: Normal rate and regular rhythm.     Heart sounds: Normal heart sounds. No murmur.     Comments: The patient's rhythm was actually regular today with no atrial fib being noted. Pulmonary:     Effort: Pulmonary effort is normal.     Breath sounds: Normal  breath sounds. No wheezing or rales.     Comments: Clear anteriorly and posteriorly Abdominal:     General: Abdomen is flat. Bowel sounds are normal.     Palpations: Abdomen is soft. There is no mass.     Tenderness: There is no abdominal tenderness.  Musculoskeletal:        General: No tenderness.     Right lower leg: No edema.     Left lower leg: No edema.     Comments: Some gait instability and has a somewhat kyphotic posture.  Lymphadenopathy:     Cervical: No cervical  adenopathy.  Skin:    General: Skin is warm and dry.     Findings: No rash.  Neurological:     General: No focal deficit present.     Mental Status: She is alert and oriented to person, place, and time.     Cranial Nerves: No cranial nerve deficit.     Deep Tendon Reflexes: Reflexes are normal and symmetric.  Psychiatric:        Mood and Affect: Mood normal.        Behavior: Behavior normal.        Thought Content: Thought content normal.        Judgment: Judgment normal.     Comments: Mood affect and behavior were normal though her memory may be somewhat diminished.  She is overall stable.    BP (!) 176/87   Pulse 80   Temp (!) 96.7 F (35.9 C) (Oral)   Ht 5' 1.5" (1.562 m)   Wt 153 lb (69.4 kg)   BMI 28.44 kg/m         Assessment & Plan:  1. Pure hypercholesterolemia -Continue with as aggressive therapeutic lifestyle changes as possible and current statin treatment. - CBC with Differential/Platelet - Lipid panel  2. Vitamin D deficiency -Continue with vitamin D replacement pending results of lab work - CBC with Differential/Platelet - VITAMIN D 25 Hydroxy (Vit-D Deficiency, Fractures)  3. Essential hypertension -Daughter says that blood pressures at home are always good and much better than here in the office and they will bring readings by for review at their convenience.  No change in treatment today. - BMP8+EGFR - CBC with Differential/Platelet - Hepatic function panel  4. Thoracic  aortic atherosclerosis ([place]) -Continue with statin and therapeutic lifestyle changes - CBC with Differential/Platelet - Lipid panel  5. Persistent atrial fibrillation -Patient was actually not in atrial fib today but had a regular rate and rhythm at 72/min - CBC with Differential/Platelet  6. Cerebrovascular accident (CVA), unspecified mechanism ([place]) -She appears to have recovered fully from this other than her forgetfulness. - CBC with Differential/Platelet  7. Esophageal stricture -She has had a dilatation and is doing well with her swallowing  8. Frequent urinary tract infections -She is drinking more regularly and this is encouraged by the family and she will continue with fluid intake regularly.  9. Paroxysmal atrial fibrillation ([facility]) -The atrial fib was absent today and she will continue with her blood thinner  10. Abnormal chest CT -The recent CT scan from August was reviewed and they are requesting another CT scan in August of next year.  The patient's daughter is aware of this and she was given a copy of the most recent report as a reminder.  Patient Instructions                       Medicare Annual Wellness Visit  [facility] and the medical providers at [facility] strive to bring you the best medical care.  In doing so we not only want to address your current medical conditions and concerns but also to detect new conditions early and prevent illness, disease and health-related problems.    Medicare offers a yearly Wellness Visit which allows our clinical staff to assess your need for preventative services including immunizations, lifestyle education, counseling to decrease risk of preventable diseases and screening for fall risk and other medical concerns.    This visit is provided free of charge (no copay) for all  Medicare recipients. The clinical pharmacists at [facility] have begun to conduct these Wellness Visits which  will also include a thorough review of all your medications.    As you primary medical provider recommend that you make an appointment for your Annual Wellness Visit if you have not done so already this year.  You may set up this appointment before you leave today or you may call back ([phone]) and schedule an appointment.  Please make sure when you call that you mention that you are scheduling your Annual Wellness Visit with the clinical pharmacist so that the appointment may be made for the proper length of time.     Continue current medications. Continue good therapeutic lifestyle changes which include good diet and exercise. Fall precautions discussed with patient. If an FOBT was given today- please return it to our front desk. If you are over 65 years old - you may need Prevnar 20 or the adult Pneumonia vaccine.  **Flu shots are available--- please call and schedule a FLU-CLINIC appointment**  After your visit with [place] today you will receive a survey in the mail or online from [employer] regarding your care with [place]. Please take a moment to fill this out. Your feedback is very important to [place] as you can help [place] better understand your patient needs as well as improve your experience and satisfaction. WE CARE ABOUT YOU!!!   The patient should stay active physically and get out as much as possible but being careful not to get exposed to any fluid bugs and viruses are going around. She should use her cane and she should be careful so as not to fall She should continue to drink plenty of fluids and stay well-hydrated to avoid urinary tract infections She should let her children know if there is any increasing problems with blood pressure or swallowing Please bring blood pressure readings from home by for review when possible  Arrie Senate MD

## 2018-08-18 NOTE — Patient Instructions (Addendum)
Medicare Annual Wellness Visit  [facility] and the medical providers at [facility] strive to bring you the best medical care.  In doing so we not only want to address your current medical conditions and concerns but also to detect new conditions early and prevent illness, disease and health-related problems.    Medicare offers a yearly Wellness Visit which allows our clinical staff to assess your need for preventative services including immunizations, lifestyle education, counseling to decrease risk of preventable diseases and screening for fall risk and other medical concerns.    This visit is provided free of charge (no copay) for all Medicare recipients. The clinical pharmacists at [facility] have begun to conduct these Wellness Visits which will also include a thorough review of all your medications.    As you primary medical provider recommend that you make an appointment for your Annual Wellness Visit if you have not done so already this year.  You may set up this appointment before you leave today or you may call back [phone]) and schedule an appointment.  Please make sure when you call that you mention that you are scheduling your Annual Wellness Visit with the clinical pharmacist so that the appointment may be made for the proper length of time.     Continue current medications. Continue good therapeutic lifestyle changes which include good diet and exercise. Fall precautions discussed with patient. If an FOBT was given today- please return it to our front desk. If you are over 30 years old - you may need Prevnar 15 or the adult Pneumonia vaccine.  **Flu shots are available--- please call and schedule a FLU-CLINIC appointment**  After your visit with [place] today you will receive a survey in the mail or online from [employer] regarding your care with [place]. Please take a moment to fill this out. Your feedback is very  important to [place] as you can help [place] better understand your patient needs as well as improve your experience and satisfaction. WE CARE ABOUT YOU!!!   The patient should stay active physically and get out as much as possible but being careful not to get exposed to any fluid bugs and viruses are going around. She should use her cane and she should be careful so as not to fall She should continue to drink plenty of fluids and stay well-hydrated to avoid urinary tract infections She should let her children know if there is any increasing problems with blood pressure or swallowing Please bring blood pressure readings from home by for review when possible

## 2018-08-19 LAB — HEPATIC FUNCTION PANEL
ALBUMIN: 4.3 g/dL (ref 3.5–4.7)
ALT: 16 IU/L (ref 0–32)
AST: 19 IU/L (ref 0–40)
Alkaline Phosphatase: 79 IU/L (ref 39–117)
Bilirubin Total: 0.6 mg/dL (ref 0.0–1.2)
Bilirubin, Direct: 0.18 mg/dL (ref 0.00–0.40)
Total Protein: 7.4 g/dL (ref 6.0–8.5)

## 2018-08-19 LAB — BMP8+EGFR
BUN/Creatinine Ratio: 11 — ABNORMAL LOW (ref 12–28)
BUN: 7 mg/dL — ABNORMAL LOW (ref 8–27)
CALCIUM: 9.5 mg/dL (ref 8.7–10.3)
CO2: 26 mmol/L (ref 20–29)
Chloride: 98 mmol/L (ref 96–106)
Creatinine, Ser: 0.62 mg/dL (ref 0.57–1.00)
GFR calc Af Amer: 97 mL/min/{1.73_m2} (ref >59)
GFR calc non Af Amer: 84 mL/min/{1.73_m2} (ref >59)
GLUCOSE: 104 mg/dL — AB (ref 65–99)
POTASSIUM: 4 mmol/L (ref 3.5–5.2)
Sodium: 139 mmol/L (ref 134–144)

## 2018-08-19 LAB — CBC WITH DIFFERENTIAL/PLATELET
BASOS: 1 %
Basophils Absolute: 0 10*3/uL (ref 0.0–0.2)
EOS (ABSOLUTE): 0.2 10*3/uL (ref 0.0–0.4)
EOS: 2 %
HEMATOCRIT: 46.1 % (ref 34.0–46.6)
Hemoglobin: 15.1 g/dL (ref 11.1–15.9)
IMMATURE GRANULOCYTES: 0 %
Immature Grans (Abs): 0 10*3/uL (ref 0.0–0.1)
LYMPHS ABS: 1.4 10*3/uL (ref 0.7–3.1)
Lymphs: 18 %
MCH: 29.8 pg (ref 26.6–33.0)
MCHC: 32.8 g/dL (ref 31.5–35.7)
MCV: 91 fL (ref 79–97)
Monocytes Absolute: 0.6 10*3/uL (ref 0.1–0.9)
Monocytes: 8 %
NEUTROS ABS: 5.3 10*3/uL (ref 1.4–7.0)
Neutrophils: 71 %
Platelets: 216 10*3/uL (ref 150–450)
RBC: 5.07 x10E6/uL (ref 3.77–5.28)
RDW: 13.4 % (ref 12.3–15.4)
WBC: 7.5 10*3/uL (ref 3.4–10.8)

## 2018-08-19 LAB — LIPID PANEL
CHOL/HDL RATIO: 3 ratio (ref 0.0–4.4)
Cholesterol, Total: 142 mg/dL (ref 100–199)
HDL: 47 mg/dL (ref >39)
LDL Calculated: 74 mg/dL (ref 0–99)
Triglycerides: 107 mg/dL (ref 0–149)
VLDL Cholesterol Cal: 21 mg/dL (ref 5–40)

## 2018-08-19 LAB — VITAMIN D 25 HYDROXY (VIT D DEFICIENCY, FRACTURES): VIT D 25 HYDROXY: 62 ng/mL (ref 30.0–100.0)

## 2018-09-08 ENCOUNTER — Other Ambulatory Visit: Payer: Self-pay | Admitting: Family Medicine

## 2018-09-15 ENCOUNTER — Other Ambulatory Visit: Payer: Self-pay | Admitting: Family Medicine

## 2018-09-27 ENCOUNTER — Other Ambulatory Visit: Payer: Self-pay | Admitting: Family Medicine

## 2018-10-24 ENCOUNTER — Other Ambulatory Visit: Payer: Self-pay | Admitting: Family Medicine

## 2018-11-05 ENCOUNTER — Other Ambulatory Visit: Payer: Medicare Other

## 2018-11-05 ENCOUNTER — Telehealth: Payer: Self-pay | Admitting: Family Medicine

## 2018-11-05 DIAGNOSIS — R350 Frequency of micturition: Secondary | ICD-10-CM | POA: Diagnosis not present

## 2018-11-05 DIAGNOSIS — N390 Urinary tract infection, site not specified: Secondary | ICD-10-CM | POA: Diagnosis not present

## 2018-11-05 DIAGNOSIS — R410 Disorientation, unspecified: Secondary | ICD-10-CM

## 2018-11-05 LAB — URINALYSIS, COMPLETE
BILIRUBIN UA: NEGATIVE
Glucose, UA: NEGATIVE
Ketones, UA: NEGATIVE
Leukocytes, UA: NEGATIVE
Nitrite, UA: NEGATIVE
PH UA: 7 (ref 5.0–7.5)
PROTEIN UA: NEGATIVE
Specific Gravity, UA: 1.01 (ref 1.005–1.030)
Urobilinogen, Ur: 0.2 mg/dL (ref 0.2–1.0)

## 2018-11-05 LAB — MICROSCOPIC EXAMINATION

## 2018-11-05 NOTE — Telephone Encounter (Signed)
Aware. 

## 2018-11-05 NOTE — Telephone Encounter (Signed)
Please review urine results and advise.

## 2018-11-05 NOTE — Telephone Encounter (Signed)
Last office visit was December 2019.  Please advise if may bring in specimen or needs to be seen.

## 2018-11-05 NOTE — Telephone Encounter (Signed)
Patient's daughter [name] was called with the preliminary urinalysis result which had no signs of infection.  She is concerned that her mother is complaining with back pain.  I told her that the urine culture and sensitivity is still pending.  She would like to have her mother seen this evening but there were no appointments available.  The front office will call [name] and give an appointment to her for her mother to be seen in the morning because of back pain.  She will be at [phone] until 630 this evening.

## 2018-11-05 NOTE — Telephone Encounter (Signed)
Please get clean-catch midstream urine for urinalysis and possible culture and sensitivity.

## 2018-11-05 NOTE — Telephone Encounter (Signed)
Appointment given for tomorrow with [facility].

## 2018-11-06 ENCOUNTER — Ambulatory Visit (INDEPENDENT_AMBULATORY_CARE_PROVIDER_SITE_OTHER): Payer: Medicare Other | Admitting: Family Medicine

## 2018-11-06 ENCOUNTER — Ambulatory Visit (INDEPENDENT_AMBULATORY_CARE_PROVIDER_SITE_OTHER): Payer: Medicare Other

## 2018-11-06 ENCOUNTER — Encounter: Payer: Self-pay | Admitting: Family Medicine

## 2018-11-06 VITALS — BP 143/87 | HR 107 | Temp 97.1°F | Ht 61.5 in | Wt 155.0 lb

## 2018-11-06 DIAGNOSIS — R109 Unspecified abdominal pain: Secondary | ICD-10-CM

## 2018-11-06 DIAGNOSIS — R319 Hematuria, unspecified: Secondary | ICD-10-CM | POA: Diagnosis not present

## 2018-11-06 DIAGNOSIS — R350 Frequency of micturition: Secondary | ICD-10-CM

## 2018-11-06 DIAGNOSIS — R1031 Right lower quadrant pain: Secondary | ICD-10-CM

## 2018-11-06 LAB — URINE CULTURE

## 2018-11-06 NOTE — Patient Instructions (Signed)
Flank Pain, Adult Flank pain is pain in your side. The flank is the area of your side between your upper belly (abdomen) and your back. The pain may occur over a short time (acute), or it may be long-term or come back often (chronic). It may be mild or very bad. Pain in this area can be caused by many different things. Follow these instructions at home:   Drink enough fluid to keep your pee (urine) clear or pale yellow.  Rest as told by your doctor.  Take over-the-counter and prescription medicines only as told by your doctor.  Keep a journal to keep track of: ? What has caused your flank pain. ? What has made it feel better.  Keep all follow-up visits as told by your doctor. This is important. Contact a doctor if:  Medicine does not help your pain.  You have new symptoms.  Your pain gets worse.  You have a fever.  Your symptoms last longer than 2-3 days.  You have trouble peeing.  You are peeing more often than normal. Get help right away if:  You have trouble breathing.  You are short of breath.  Your belly hurts, or it is swollen or red.  You feel sick to your stomach (nauseous).  You throw up (vomit).  You feel like you will pass out, or you do pass out (faint).  You have blood in your pee. Summary  Flank pain is pain in your side. The flank is the area of your side between your upper belly (abdomen) and your back.  Flank pain may occur over a short time (acute), or it may be long-term or come back often (chronic). It may be mild or very bad.  Pain in this area can be caused by many different things.  Contact your doctor if your symptoms get worse or they last longer than 2-3 days. This information is not intended to replace advice given to you by your health care provider. Make sure you discuss any questions you have with your health care provider. Document Released: 05/29/2008 Document Revised: 12/10/2016 Document Reviewed: 12/10/2016 Elsevier  Interactive Patient Education  2019 [employer]  

## 2018-11-06 NOTE — Progress Notes (Signed)
Subjective:  Patient ID: Kari [name], female    DOB: 26-Sep-1933, 85 y.o.   MRN: [id number]  Chief Complaint:  Back Pain (brought urine in yesterday, waiting on culture to come back)   HPI: Kari [name] is a 85 y.o. female presenting on 11/06/2018 for Back Pain (brought urine in yesterday, waiting on culture to come back)  Pt presents today with complaints of right flank pain and urinary frequency. States she provided a urine specimen yesterday that was ok, states she is awaiting a culture. Pt states she continues to have right flank pain and urinary frequency. Pt denies fever, chills, weakness, or hematuria. No dysuria. Pt states she has aching in her right flank, 4/10, states she took 2 tylenol last night with great relief of symptoms.   Relevant past medical, surgical, family, and social history reviewed and updated as indicated.  Allergies and medications reviewed and updated.   Past Medical History:  Diagnosis Date  . Adenomatous polyps   . AF (atrial fibrillation) ([place])   . Cataract   . Gallstones   . Hx of adenomatous colonic polyps   . Hyperlipidemia    x5 years  . Hypertension    x5 years  . Kyphosis   . Metabolic syndrome X   . Obesity   . Osteoporosis   . Stroke ([place]) 07/2017  . Subdural hematoma ([place])   . Symptomatic menopausal or female climacteric states   . Vitamin D deficiency     Past Surgical History:  Procedure Laterality Date  . ABDOMINAL HYSTERECTOMY    . BLADDER SUSPENSION    . COLONOSCOPY  multiple  . ESOPHAGEAL DILATION N/A 03/03/2018   Procedure: ESOPHAGEAL DILATION;  Surgeon: Rogene Houston, MD;  [facility];  Service: Endoscopy;  Laterality: N/A;  . ESOPHAGOGASTRODUODENOSCOPY N/A 03/03/2018   Procedure: ESOPHAGOGASTRODUODENOSCOPY (EGD);  Surgeon: Rogene Houston, MD;  Location: [facility];  Service: Endoscopy;  Laterality: N/A;  . EYE SURGERY Bilateral 09/2015  . VAGINAL HYSTERECTOMY     total    Social History    Socioeconomic History  . Marital status: Married    Spouse name: Not on file  . Number of children: 2  . Years of education: Not on file  . Highest education level: Not on file  Occupational History  . Occupation: [employer]  . Financial resource strain: Not on file  . Food insecurity:    Worry: Not on file    Inability: Not on file  . Transportation needs:    Medical: Not on file    Non-medical: Not on file  Tobacco Use  . Smoking status: Never Smoker  . Smokeless tobacco: Never Used  Substance and Sexual Activity  . Alcohol use: No  . Drug use: No  . Sexual activity: Not on file  Lifestyle  . Physical activity:    Days per week: Not on file    Minutes per session: Not on file  . Stress: Not on file  Relationships  . Social connections:    Talks on phone: Not on file    Gets together: Not on file    Attends religious service: Not on file    Active member of club or organization: Not on file    Attends meetings of clubs or organizations: Not on file    Relationship status: Not on file  . Intimate partner violence:    Fear of current or ex partner: Not on file  Emotionally abused: Not on file    Physically abused: Not on file    Forced sexual activity: Not on file  Other Topics Concern  . Not on file  Social History Narrative   Lives with her husband and son, independent   1 son Surveyor, minerals)   1 Daughter Contractor)   2 grandchildren and 3 great-grandchild   12th grade education    Outpatient Encounter Medications as of 11/06/2018  Medication Sig  . amLODipine (NORVASC) 2.5 MG tablet Take 1 tablet (2.5 mg total) by mouth daily.  Marland Kitchen atenolol (TENORMIN) 50 MG tablet Take 1 tablet (50 mg total) by mouth daily.  . Benfotiamine 150 MG CAPS Take 150 mg by mouth daily.  . captopril (CAPOTEN) 50 MG tablet TAKE (1) TABLET TWICE A DAY FOR HIGH BLOOD PRESSURE.  . donepezil (ARICEPT) 5 MG tablet TAKE ONE TABLET AT BEDTIME  . ELIQUIS 5 MG TABS tablet  Take 1 tablet (5 mg total) by mouth 2 (two) times daily.  Marland Kitchen ezetimibe (ZETIA) 10 MG tablet TAKE 1 TABLET ONCE DAILY FOR CHOLESTEROL  . ferrous sulfate 325 (65 FE) MG tablet Take 325 mg by mouth daily.  . potassium chloride (K-DUR) 10 MEQ tablet TAKE 1 TABLET DAILY  . potassium chloride (K-DUR) 10 MEQ tablet TAKE 1 TABLET DAILY  . potassium chloride (K-DUR) 10 MEQ tablet TAKE 1 TABLET DAILY  . pravastatin (PRAVACHOL) 80 MG tablet TAKE 1 TABLET ONCE DAILY FOR CHOLESTEROL  . Vitamin D, Ergocalciferol, (DRISDOL) 1.25 MG (50000 UT) CAPS capsule TAKE 1 CAPSULE ONCE A WEEK   No facility-administered encounter medications on file as of 11/06/2018.     Allergies  Allergen Reactions  . Erythromycin Other (See Comments)    Reaction unknown  . Penicillins Other (See Comments)    Reaction unknown  Has patient had a PCN reaction causing immediate rash, facial/tongue/throat swelling, SOB or lightheadedness with hypotension: unknown Has patient had a PCN reaction causing severe rash involving mucus membranes or skin necrosis: unknown Has patient had a PCN reaction that required hospitalization : unknown Has patient had a PCN reaction occurring within the last 10 years: no If all of the above answers are "NO", then may proceed with Cephalosporin use.     Review of Systems  Constitutional: Negative for chills, fatigue, fever and unexpected weight change.  Gastrointestinal: Negative for abdominal pain, constipation, nausea and vomiting.  Genitourinary: Positive for flank pain, frequency and urgency. Negative for decreased urine volume, difficulty urinating, dysuria and hematuria.  Neurological: Negative for weakness.  Psychiatric/Behavioral: Positive for confusion (baseline).  All other systems reviewed and are negative.       Objective:  BP (!) 143/87   Pulse (!) 107   Temp (!) 97.1 F (36.2 C) (Oral)   Ht 5' 1.5" (1.562 m)   Wt 155 lb (70.3 kg)   BMI 28.81 kg/m    Wt Readings from Last 3  Encounters:  11/06/18 155 lb (70.3 kg)  08/18/18 153 lb (69.4 kg)  07/29/18 153 lb 9.6 oz (69.7 kg)    Physical Exam Vitals signs and nursing note reviewed.  Constitutional:      General: She is not in acute distress.    Appearance: Normal appearance. She is not ill-appearing or toxic-appearing.  HENT:     Head: Normocephalic.     Mouth/Throat:     Mouth: Mucous membranes are moist.     Pharynx: Oropharynx is clear.  Eyes:     Pupils: Pupils are equal, round, and  reactive to light.  Neck:     Musculoskeletal: Neck supple.  Cardiovascular:     Rate and Rhythm: Normal rate. Rhythm regularly irregular.     Heart sounds: Normal heart sounds.  Pulmonary:     Effort: Pulmonary effort is normal. No respiratory distress.     Breath sounds: Normal breath sounds.  Abdominal:     General: Bowel sounds are normal. There is no distension.     Palpations: Abdomen is soft. There is no hepatomegaly or splenomegaly.     Tenderness: There is no abdominal tenderness. There is no right CVA tenderness or left CVA tenderness.  Skin:    General: Skin is warm and dry.     Capillary Refill: Capillary refill takes less than 2 seconds.  Neurological:     General: No focal deficit present.     Mental Status: She is alert. Mental status is at baseline.  Psychiatric:        Mood and Affect: Mood normal.        Behavior: Behavior normal. Behavior is cooperative.        Thought Content: Thought content normal.        Judgment: Judgment normal.     Results for orders placed or performed in visit on 11/05/18  Microscopic Examination  Result Value Ref Range   WBC, UA 0-5 0 - 5 /hpf   RBC, UA 3-10 (A) 0 - 2 /hpf   Epithelial Cells (non renal) 0-10 0 - 10 /hpf   Renal Epithel, UA 0-10 (A) None seen /hpf   Bacteria, UA Few None seen/Few  Urinalysis, Complete  Result Value Ref Range   Specific Gravity, UA 1.010 1.005 - 1.030   pH, UA 7.0 5.0 - 7.5   Color, UA Amber (A) Yellow   Appearance Ur Hazy  (A) Clear   Leukocytes, UA Negative Negative   Protein, UA Negative Negative/Trace   Glucose, UA Negative Negative   Ketones, UA Negative Negative   RBC, UA Trace (A) Negative   Bilirubin, UA Negative Negative   Urobilinogen, Ur 0.2 0.2 - 1.0 mg/dL   Nitrite, UA Negative Negative   Microscopic Examination See below:      X-Ray: KUB: No renal stones noted, no acute findings. Preliminary x-ray reading by Monia Pouch, FNP-C, [facility].   Pertinent labs & imaging results that were available during my care of the patient were reviewed by me and considered in my medical decision making.  Assessment & Plan:  Markiyah was seen today for back pain.  Diagnoses and all orders for this visit:  Acute right flank pain No evident renal stoned. Can take tylenol three times per day as needed for flank pain. Report any new or worsening symptoms.  -     DG Abd 1 View; Future  Urinary frequency Urine culture pending. Will treat if warranted. Increase water intake. Avoid bladder irritants. Report any new or worsening symptoms.  -     DG Abd 1 View; Future     Continue all other maintenance medications.  Follow up plan: Return in about 1 week (around 11/13/2018), or if symptoms worsen or fail to improve, for flank pain.  Educational handout given for flank pain  The above assessment and management plan was discussed with the patient. The patient verbalized understanding of and has agreed to the management plan. Patient is aware to call the clinic if symptoms persist or worsen. Patient is aware when to return to the clinic for a follow-up visit. Patient  educated on when it is appropriate to go to the emergency department.   Monia Pouch, FNP-C [facility] [phone]

## 2018-11-07 ENCOUNTER — Ambulatory Visit (HOSPITAL_COMMUNITY)
Admission: RE | Admit: 2018-11-07 | Discharge: 2018-11-07 | Disposition: A | Discharge status: Home / Self Care | Payer: Medicare Other | Source: Ambulatory Visit | Attending: Family Medicine | Admitting: Family Medicine

## 2018-11-07 ENCOUNTER — Other Ambulatory Visit: Payer: Self-pay | Admitting: Family Medicine

## 2018-11-07 DIAGNOSIS — N200 Calculus of kidney: Secondary | ICD-10-CM | POA: Diagnosis not present

## 2018-11-07 DIAGNOSIS — R1031 Right lower quadrant pain: Secondary | ICD-10-CM | POA: Diagnosis not present

## 2018-11-07 DIAGNOSIS — R350 Frequency of micturition: Secondary | ICD-10-CM | POA: Insufficient documentation

## 2018-11-07 DIAGNOSIS — R109 Unspecified abdominal pain: Secondary | ICD-10-CM | POA: Diagnosis not present

## 2018-11-07 DIAGNOSIS — R1011 Right upper quadrant pain: Secondary | ICD-10-CM

## 2018-11-07 DIAGNOSIS — K8020 Calculus of gallbladder without cholecystitis without obstruction: Secondary | ICD-10-CM

## 2018-11-10 ENCOUNTER — Emergency Department (HOSPITAL_COMMUNITY)
Admission: EM | Admit: 2018-11-10 | Discharge: 2018-11-10 | Disposition: A | Discharge status: Home / Self Care | Payer: Medicare Other | Attending: Emergency Medicine | Admitting: Emergency Medicine

## 2018-11-10 ENCOUNTER — Emergency Department (HOSPITAL_COMMUNITY): Payer: Medicare Other

## 2018-11-10 ENCOUNTER — Encounter (HOSPITAL_COMMUNITY): Payer: Self-pay | Admitting: Emergency Medicine

## 2018-11-10 DIAGNOSIS — K8020 Calculus of gallbladder without cholecystitis without obstruction: Secondary | ICD-10-CM | POA: Diagnosis not present

## 2018-11-10 DIAGNOSIS — I10 Essential (primary) hypertension: Secondary | ICD-10-CM | POA: Diagnosis not present

## 2018-11-10 DIAGNOSIS — Z7901 Long term (current) use of anticoagulants: Secondary | ICD-10-CM | POA: Diagnosis not present

## 2018-11-10 DIAGNOSIS — Z79899 Other long term (current) drug therapy: Secondary | ICD-10-CM | POA: Diagnosis not present

## 2018-11-10 DIAGNOSIS — R109 Unspecified abdominal pain: Secondary | ICD-10-CM | POA: Diagnosis not present

## 2018-11-10 DIAGNOSIS — A09 Infectious gastroenteritis and colitis, unspecified: Secondary | ICD-10-CM

## 2018-11-10 DIAGNOSIS — Z8673 Personal history of transient ischemic attack (TIA), and cerebral infarction without residual deficits: Secondary | ICD-10-CM | POA: Insufficient documentation

## 2018-11-10 LAB — CBC WITH DIFFERENTIAL/PLATELET
Abs Immature Granulocytes: 0.01 10*3/uL (ref 0.00–0.07)
Basophils Absolute: 0 10*3/uL (ref 0.0–0.1)
Basophils Relative: 1 %
Eosinophils Absolute: 0.1 10*3/uL (ref 0.0–0.5)
Eosinophils Relative: 1 %
HCT: 49.4 % — ABNORMAL HIGH (ref 36.0–46.0)
HEMOGLOBIN: 15.2 g/dL — AB (ref 12.0–15.0)
Immature Granulocytes: 0 %
LYMPHS ABS: 1.3 10*3/uL (ref 0.7–4.0)
Lymphocytes Relative: 16 %
MCH: 29.5 pg (ref 26.0–34.0)
MCHC: 30.8 g/dL (ref 30.0–36.0)
MCV: 95.9 fL (ref 80.0–100.0)
Monocytes Absolute: 0.5 10*3/uL (ref 0.1–1.0)
Monocytes Relative: 6 %
Neutro Abs: 6 10*3/uL (ref 1.7–7.7)
Neutrophils Relative %: 76 %
Platelets: 208 10*3/uL (ref 150–400)
RBC: 5.15 MIL/uL — ABNORMAL HIGH (ref 3.87–5.11)
RDW: 13.7 % (ref 11.5–15.5)
WBC: 7.9 10*3/uL (ref 4.0–10.5)
nRBC: 0 % (ref 0.0–0.2)

## 2018-11-10 LAB — COMPREHENSIVE METABOLIC PANEL
ALK PHOS: 65 U/L (ref 38–126)
ALT: 23 U/L (ref 0–44)
AST: 28 U/L (ref 15–41)
Albumin: 4.1 g/dL (ref 3.5–5.0)
Anion gap: 7 (ref 5–15)
BUN: 12 mg/dL (ref 8–23)
CO2: 28 mmol/L (ref 22–32)
Calcium: 9.2 mg/dL (ref 8.9–10.3)
Chloride: 103 mmol/L (ref 98–111)
Creatinine, Ser: 0.53 mg/dL (ref 0.44–1.00)
GFR calc Af Amer: >60 mL/min (ref >60)
GFR calc non Af Amer: >60 mL/min (ref >60)
Glucose, Bld: 117 mg/dL — ABNORMAL HIGH (ref 70–99)
Potassium: 3.3 mmol/L — ABNORMAL LOW (ref 3.5–5.1)
Sodium: 138 mmol/L (ref 135–145)
Total Bilirubin: 0.7 mg/dL (ref 0.3–1.2)
Total Protein: 7.6 g/dL (ref 6.5–8.1)

## 2018-11-10 LAB — LIPASE, BLOOD: Lipase: 33 U/L (ref 11–51)

## 2018-11-10 MED ORDER — ONDANSETRON HCL 4 MG/2ML IJ SOLN
4.0000 mg | Freq: Once | INTRAMUSCULAR | Status: AC
  Administered 2018-11-10: 4 mg via INTRAVENOUS
  Filled 2018-11-10: qty 2

## 2018-11-10 MED ORDER — HYDROCODONE-ACETAMINOPHEN 5-325 MG PO TABS: 1.0000 | ORAL_TABLET | Freq: Four times a day (QID) | ORAL | 0 refills | Status: DC | PRN

## 2018-11-10 MED ORDER — HYDROMORPHONE HCL 1 MG/ML IJ SOLN
0.5000 mg | Freq: Once | INTRAMUSCULAR | Status: AC
  Administered 2018-11-10: 0.5 mg via INTRAVENOUS
  Filled 2018-11-10: qty 1

## 2018-11-10 NOTE — ED Triage Notes (Signed)
Pt's daughter states the pt has gallstones and has appt to see Dr. Constance Haw tomorrow for surgical evaluation.  Has been having intermittent right upper quad pain for the past 2 nights.

## 2018-11-10 NOTE — ED Provider Notes (Signed)
[facility] Provider Note   CSN: [id number] Arrival date & time: 11/10/18  1242    History   Chief Complaint Chief Complaint  Patient presents with  . Abdominal Pain    HPI Kari [name] is a 85 y.o. female.     Patient complains of right flank pain.  She had a CT of the abdomen couple days ago showed gallstones.  The pain seems to be getting worse.  She is to follow-up with general surgery tomorrow.  No vomiting no diarrhea no abdominal discomfort  The history is provided by the patient. No language interpreter was used.  Back Pain  Pain location: Right flank. Quality:  Aching Radiates to:  Does not radiate Pain severity:  Moderate Pain is:  Same all the time Onset quality: Started about a week ago. Timing:  Constant Progression:  Waxing and waning Chronicity:  New Context: not emotional stress   Relieved by:  Nothing Worsened by:  Nothing Ineffective treatments:  None tried Associated symptoms: no abdominal pain, no chest pain and no headaches     Past Medical History:  Diagnosis Date  . Adenomatous polyps   . AF (atrial fibrillation) ([place])   . Cataract   . Gallstones   . Hx of adenomatous colonic polyps   . Hyperlipidemia    x5 years  . Hypertension    x5 years  . Kyphosis   . Metabolic syndrome X   . Obesity   . Osteoporosis   . Stroke ([place]) 07/2017  . Subdural hematoma ([place])   . Symptomatic menopausal or female climacteric states   . Vitamin D deficiency     Patient Active Problem List   Diagnosis Date Noted  . Esophageal dysphagia 01/31/2018  . Pain in both feet 10/03/2017  . SDH (subdural hematoma) ([place]) 08/24/2017  . CVA (cerebral vascular accident) ([place]) 07/29/2017  . Influenza A 12/06/2016  . Sciatica of right side 04/23/2016  . At high risk for falls 04/23/2016  . Increased BMI 09/30/2015  . Hyperlipidemia 09/07/2015  . Low back pain 09/07/2015  . Hypoxia 09/06/2015  . Orthostatic syncope 09/04/2015  .  Orthostatic hypotension 09/04/2015  . Aortic atherosclerosis ([place]) 05/03/2015  . Vitamin D deficiency 02/02/2015  . Pre-diabetes 02/02/2015  . Metabolic syndrome 27/02/2375  . Osteoporosis 03/11/2013  . Atrial fibrillation ([place]) 11/24/2012  . Hypokalemia 05/04/2012  . Obesity   . Symptomatic menopausal or female climacteric states   . Kyphosis   . Gallstones   . Personal history of adenomatous colonic polyps   . Essential hypertension 03/29/2009    Past Surgical History:  Procedure Laterality Date  . ABDOMINAL HYSTERECTOMY    . BLADDER SUSPENSION    . COLONOSCOPY  multiple  . ESOPHAGEAL DILATION N/A 03/03/2018   Procedure: ESOPHAGEAL DILATION;  Surgeon: Rogene Houston, MD;  [facility];  Service: Endoscopy;  Laterality: N/A;  . ESOPHAGOGASTRODUODENOSCOPY N/A 03/03/2018   Procedure: ESOPHAGOGASTRODUODENOSCOPY (EGD);  Surgeon: Rogene Houston, MD;  [facility];  Service: Endoscopy;  Laterality: N/A;  . EYE SURGERY Bilateral 09/2015  . VAGINAL HYSTERECTOMY     total     OB History   No obstetric history on file.      Home Medications    Prior to Admission medications   Medication Sig Start Date End Date Taking? Authorizing Provider  amLODipine (NORVASC) 2.5 MG tablet Take 1 tablet (2.5 mg total) by mouth daily. 04/16/18   Chipper Herb, MD  atenolol (  TENORMIN) 50 MG tablet Take 1 tablet (50 mg total) by mouth daily. 10/29/17   Chipper Herb, MD  Benfotiamine 150 MG CAPS Take 150 mg by mouth daily.    [provider]  captopril (CAPOTEN) 50 MG tablet TAKE (1) TABLET TWICE A DAY FOR HIGH BLOOD PRESSURE. 09/29/18   Chipper Herb, MD  donepezil (ARICEPT) 5 MG tablet TAKE ONE TABLET AT BEDTIME 09/16/18   Chipper Herb, MD  ELIQUIS 5 MG TABS tablet Take 1 tablet (5 mg total) by mouth 2 (two) times daily. 07/07/18   Chipper Herb, MD  ezetimibe (ZETIA) 10 MG tablet TAKE 1 TABLET ONCE DAILY FOR CHOLESTEROL 10/24/18   Chipper Herb, MD  ferrous  sulfate 325 (65 FE) MG tablet Take 325 mg by mouth daily.    [provider]  potassium chloride (K-DUR) 10 MEQ tablet TAKE 1 TABLET DAILY 03/12/18   Chipper Herb, MD  potassium chloride (K-DUR) 10 MEQ tablet TAKE 1 TABLET DAILY 08/08/18   Chipper Herb, MD  potassium chloride (K-DUR) 10 MEQ tablet TAKE 1 TABLET DAILY 09/09/18   Chipper Herb, MD  pravastatin (PRAVACHOL) 80 MG tablet TAKE 1 TABLET ONCE DAILY FOR CHOLESTEROL 07/22/18   Chipper Herb, MD  Vitamin D, Ergocalciferol, (DRISDOL) 1.25 MG (50000 UT) CAPS capsule TAKE 1 CAPSULE ONCE A WEEK 09/09/18   Chipper Herb, MD    Family History Family History  Problem Relation Age of Onset  . Heart attack Mother   . Stroke Mother   . Heart attack Father   . Alcohol abuse Father   . Hypertension Sister   . Stroke Sister   . Stroke Brother   . Hypertension Brother   . Heart attack Brother   . Hypertension Brother   . Depression Brother        suicide  . Hypertension Sister   . Heart attack Other   . Stroke Other   . Breast cancer Other   . Colon cancer Neg Hx     Social History Social History   Tobacco Use  . Smoking status: Never Smoker  . Smokeless tobacco: Never Used  Substance Use Topics  . Alcohol use: No  . Drug use: No     Allergies   Erythromycin and Penicillins   Review of Systems Review of Systems  Constitutional: Negative for appetite change and fatigue.  HENT: Negative for congestion, ear discharge and sinus pressure.   Eyes: Negative for discharge.  Respiratory: Negative for cough.   Cardiovascular: Negative for chest pain.  Gastrointestinal: Negative for abdominal pain and diarrhea.  Genitourinary: Negative for frequency and hematuria.  Musculoskeletal: Positive for back pain.  Skin: Negative for rash.  Neurological: Negative for seizures and headaches.  Psychiatric/Behavioral: Negative for hallucinations.     Physical Exam Updated Vital Signs BP (!) 161/92 (BP Location: Right  Arm)   Pulse 85   Temp 98.3 F (36.8 C) (Oral)   Resp 18   Ht 5\' 2"  (1.575 m)   Wt 70 kg   SpO2 98%   BMI 28.23 kg/m   Physical Exam Vitals signs and nursing note reviewed.  Constitutional:      Appearance: She is well-developed.  HENT:     Head: Normocephalic.     Nose: Nose normal.  Eyes:     General: No scleral icterus.    Conjunctiva/sclera: Conjunctivae normal.  Neck:     Musculoskeletal: Neck supple.     Thyroid: No  thyromegaly.  Cardiovascular:     Rate and Rhythm: Normal rate and regular rhythm.     Heart sounds: No murmur. No friction rub. No gallop.   Pulmonary:     Breath sounds: No stridor. No wheezing or rales.  Chest:     Chest wall: No tenderness.  Abdominal:     General: There is no distension.     Tenderness: There is no abdominal tenderness. There is no rebound.  Genitourinary:    Comments: Tender right flank Musculoskeletal: Normal range of motion.  Lymphadenopathy:     Cervical: No cervical adenopathy.  Skin:    General: Skin is warm.     Findings: No erythema or rash.  Neurological:     Mental Status: She is oriented to person, place, and time.     Motor: No abnormal muscle tone.     Coordination: Coordination normal.  Psychiatric:        Behavior: Behavior normal.      ED Treatments / Results  Labs (all labs ordered are listed, but only abnormal results are displayed) Labs Reviewed  CBC WITH DIFFERENTIAL/PLATELET - Abnormal; Notable for the following components:      Result Value   RBC 5.15 (*)    Hemoglobin 15.2 (*)    HCT 49.4 (*)    All other components within normal limits  COMPREHENSIVE METABOLIC PANEL - Abnormal; Notable for the following components:   Potassium 3.3 (*)    Glucose, Bld 117 (*)    All other components within normal limits  LIPASE, BLOOD  URINALYSIS, ROUTINE W REFLEX MICROSCOPIC    EKG None  Radiology No results found.  Procedures Procedures (including critical care time)  Medications Ordered in  ED Medications  HYDROmorphone (DILAUDID) injection 0.5 mg (0.5 mg Intravenous Given 11/10/18 1349)  ondansetron (ZOFRAN) injection 4 mg (4 mg Intravenous Given 11/10/18 1349)     Initial Impression / Assessment and Plan / ED Course  I have reviewed the triage vital signs and the nursing notes.  Pertinent labs & imaging results that were available during my care of the patient were reviewed by me and considered in my medical decision making (see chart for details).        Labs unremarkable.  Patient had a urinalysis and urine culture done a few days ago and that was negative.  CT scan showed gallstones.  Ultrasound shows gallstones without cholecystitis.  She does have some stones in the neck of the gallbladder.  Patient improved with pain medicine.  I spoke with general surgery and they will follow-up with her tomorrow and determine whether cholecystectomy is appropriate  Final Clinical Impressions(s) / ED Diagnoses   Final diagnoses:  Diarrhea of infectious origin    ED Discharge Orders    None       Milton Ferguson, MD 11/10/18 [phone]

## 2018-11-10 NOTE — ED Notes (Signed)
Pt is only having lower right sided back pain. No other abdominal complaints

## 2018-11-10 NOTE — ED Notes (Signed)
Have notified pt of urine sample. No need to urinate yet

## 2018-11-10 NOTE — Discharge Instructions (Addendum)
Follow up with the surgeon tomorrow as planned

## 2018-11-10 NOTE — ED Notes (Signed)
Pt to ultrasound

## 2018-11-11 ENCOUNTER — Ambulatory Visit (INDEPENDENT_AMBULATORY_CARE_PROVIDER_SITE_OTHER): Payer: Medicare Other | Admitting: General Surgery

## 2018-11-11 ENCOUNTER — Encounter: Payer: Self-pay | Admitting: General Surgery

## 2018-11-11 VITALS — BP 140/77 | HR 70 | Temp 97.5°F | Resp 20 | Wt 156.3 lb

## 2018-11-11 DIAGNOSIS — K8020 Calculus of gallbladder without cholecystitis without obstruction: Secondary | ICD-10-CM

## 2018-11-11 DIAGNOSIS — R109 Unspecified abdominal pain: Secondary | ICD-10-CM

## 2018-11-11 NOTE — Progress Notes (Signed)
[facility] History and Physical  Reason for Referral: Gallstones  Referring Physician:  Dr. Laurance Flatten, Estella Husk, MD   Chief Complaint    Flank Pain; Cholelithiasis      Kari [name] is a 85 y.o. female.  HPI: Kari [name] is a 85 yo with a history of AF, Stroke, HTN who presented to her PCP with complaints of right flank pain, foul smelling urine, and malaise. She had a history of UTI and she was worked up for UTI and renal stones but was found to not have a UTI and no stones on the right kidney. She says the right flank pain is not associated with any abdominal pain and is not associated with any food. She says that she has no nausea/vomiting or reflux symptoms. She reports that she does not think she injured herself but cannot know for sure. She has been taking some tylenol and this has helped some with the pain.  She describes th pain as achy.   She was seen in the ED with the continued pain but given the lack of other symptoms we had her follow up in the clinic. Her CT scan obtained does demonstrate some gallstones and her ultrasound showed no signs of cholecystitis. Her son reports he presented with his gallbladder in a similar fashion with back/ flank pain.    Past Medical History:  Diagnosis Date  . Adenomatous polyps   . AF (atrial fibrillation) ([place])   . Cataract   . Gallstones   . Hx of adenomatous colonic polyps   . Hyperlipidemia    x5 years  . Hypertension    x5 years  . Kyphosis   . Metabolic syndrome X   . Obesity   . Osteoporosis   . Stroke ([place]) 07/2017  . Subdural hematoma ([place])   . Symptomatic menopausal or female climacteric states   . Vitamin D deficiency     Past Surgical History:  Procedure Laterality Date  . ABDOMINAL HYSTERECTOMY    . BLADDER SUSPENSION    . COLONOSCOPY  multiple  . ESOPHAGEAL DILATION N/A 03/03/2018   Procedure: ESOPHAGEAL DILATION;  Surgeon: Rogene Houston, MD;  Location: [facility];  Service: Endoscopy;   Laterality: N/A;  . ESOPHAGOGASTRODUODENOSCOPY N/A 03/03/2018   Procedure: ESOPHAGOGASTRODUODENOSCOPY (EGD);  Surgeon: Rogene Houston, MD;  Location: [facility];  Service: Endoscopy;  Laterality: N/A;  . EYE SURGERY Bilateral 09/2015  . VAGINAL HYSTERECTOMY     total    Family History  Problem Relation Age of Onset  . Heart attack Mother   . Stroke Mother   . Heart attack Father   . Alcohol abuse Father   . Hypertension Sister   . Stroke Sister   . Stroke Brother   . Hypertension Brother   . Heart attack Brother   . Hypertension Brother   . Depression Brother        suicide  . Hypertension Sister   . Heart attack Other   . Stroke Other   . Breast cancer Other   . Colon cancer Neg Hx     Social History   Tobacco Use  . Smoking status: Never Smoker  . Smokeless tobacco: Never Used  Substance Use Topics  . Alcohol use: No  . Drug use: No    Medications: I have reviewed the patient's current medications. Allergies as of 11/11/2018      Reactions   Erythromycin Other (See Comments)   Reaction unknown   Penicillins  Other (See Comments)   Reaction unknown  Has patient had a PCN reaction causing immediate rash, facial/tongue/throat swelling, SOB or lightheadedness with hypotension: unknown Has patient had a PCN reaction causing severe rash involving mucus membranes or skin necrosis: unknown Has patient had a PCN reaction that required hospitalization : unknown Has patient had a PCN reaction occurring within the last 10 years: no If all of the above answers are "NO", then may proceed with Cephalosporin use.      Medication List       Accurate as of November 11, 2018 12:31 PM. Always use your most recent med list.        amLODipine 2.5 MG tablet Commonly known as:  NORVASC Take 1 tablet (2.5 mg total) by mouth daily.   atenolol 50 MG tablet Commonly known as:  TENORMIN Take 1 tablet (50 mg total) by mouth daily.   Benfotiamine 150 MG Caps Take 150 mg by mouth  daily.   captopril 50 MG tablet Commonly known as:  CAPOTEN TAKE (1) TABLET TWICE A DAY FOR HIGH BLOOD PRESSURE.   donepezil 5 MG tablet Commonly known as:  ARICEPT TAKE ONE TABLET AT BEDTIME   Eliquis 5 MG Tabs tablet Generic drug:  apixaban Take 1 tablet (5 mg total) by mouth 2 (two) times daily.   ezetimibe 10 MG tablet Commonly known as:  ZETIA TAKE 1 TABLET ONCE DAILY FOR CHOLESTEROL   ferrous sulfate 325 (65 FE) MG tablet Take 325 mg by mouth daily.   HYDROcodone-acetaminophen 5-325 MG tablet Commonly known as:  NORCO/VICODIN Take 1 tablet by mouth every 6 (six) hours as needed for moderate pain.   potassium chloride 10 MEQ tablet Commonly known as:  K-DUR TAKE 1 TABLET DAILY   potassium chloride 10 MEQ tablet Commonly known as:  K-DUR TAKE 1 TABLET DAILY   potassium chloride 10 MEQ tablet Commonly known as:  K-DUR TAKE 1 TABLET DAILY   pravastatin 80 MG tablet Commonly known as:  PRAVACHOL TAKE 1 TABLET ONCE DAILY FOR CHOLESTEROL   Vitamin D (Ergocalciferol) 1.25 MG (50000 UT) Caps capsule Commonly known as:  DRISDOL TAKE 1 CAPSULE ONCE A WEEK        ROS:  A comprehensive review of systems was negative except for: Musculoskeletal: positive for right flank pain  Blood pressure 140/77, pulse 70, temperature (!) 97.5 F (36.4 C), temperature source Temporal, resp. rate 20, weight 156 lb 4.8 oz (70.9 kg). Physical Exam Vitals signs reviewed.  Constitutional:      Appearance: Normal appearance.  HENT:     Head: Normocephalic and atraumatic.     Nose: Nose normal.     Mouth/Throat:     Mouth: Mucous membranes are moist.  Eyes:     Extraocular Movements: Extraocular movements intact.     Pupils: Pupils are equal, round, and reactive to light.  Neck:     Musculoskeletal: Normal range of motion.  Cardiovascular:     Rate and Rhythm: Normal rate. Rhythm irregular.  Pulmonary:     Effort: Pulmonary effort is normal.     Breath sounds: Normal breath  sounds.  Abdominal:     General: There is no distension.     Palpations: Abdomen is soft. There is no mass.     Tenderness: There is no abdominal tenderness. There is right CVA tenderness. There is no guarding or rebound.     Comments: Right flank pain with palpation, no obvious masses   Musculoskeletal: Normal range of motion.  General: No swelling.  Skin:    General: Skin is warm and dry.  Neurological:     General: No focal deficit present.     Mental Status: She is alert and oriented to person, place, and time.  Psychiatric:        Mood and Affect: Mood normal.        Behavior: Behavior normal.        Thought Content: Thought content normal.        Judgment: Judgment normal.     Results: Results for orders placed or performed during the hospital encounter of 11/10/18 (from the past 48 hour(s))  CBC with Differential/Platelet     Status: Abnormal   Collection Time: 11/10/18  1:35 PM  Result Value Ref Range   WBC 7.9 4.0 - 10.5 K/uL   RBC 5.15 (H) 3.87 - 5.11 MIL/uL   Hemoglobin 15.2 (H) 12.0 - 15.0 g/dL   HCT 49.4 (H) 36.0 - 46.0 %   MCV 95.9 80.0 - 100.0 fL   MCH 29.5 26.0 - 34.0 pg   MCHC 30.8 30.0 - 36.0 g/dL   RDW 13.7 11.5 - 15.5 %   Platelets 208 150 - 400 K/uL   nRBC 0.0 0.0 - 0.2 %   Neutrophils Relative % 76 %   Neutro Abs 6.0 1.7 - 7.7 K/uL   Lymphocytes Relative 16 %   Lymphs Abs 1.3 0.7 - 4.0 K/uL   Monocytes Relative 6 %   Monocytes Absolute 0.5 0.1 - 1.0 K/uL   Eosinophils Relative 1 %   Eosinophils Absolute 0.1 0.0 - 0.5 K/uL   Basophils Relative 1 %   Basophils Absolute 0.0 0.0 - 0.1 K/uL   Immature Granulocytes 0 %   Abs Immature Granulocytes 0.01 0.00 - 0.07 K/uL    Comment: Performed at [facility], [address] [place], [place] [address]  Comprehensive metabolic panel     Status: Abnormal   Collection Time: 11/10/18  1:35 PM  Result Value Ref Range   Sodium 138 135 - 145 mmol/L   Potassium 3.3 (L) 3.5 - 5.1 mmol/L   Chloride 103 98  - 111 mmol/L   CO2 28 22 - 32 mmol/L   Glucose, Bld 117 (H) 70 - 99 mg/dL   BUN 12 8 - 23 mg/dL   Creatinine, Ser 0.53 0.44 - 1.00 mg/dL   Calcium 9.2 8.9 - 10.3 mg/dL   Total Protein 7.6 6.5 - 8.1 g/dL   Albumin 4.1 3.5 - 5.0 g/dL   AST 28 15 - 41 U/L   ALT 23 0 - 44 U/L   Alkaline Phosphatase 65 38 - 126 U/L   Total Bilirubin 0.7 0.3 - 1.2 mg/dL   GFR calc non Af Amer >60 >60 mL/min   GFR calc Af Amer >60 >60 mL/min   Anion gap 7 5 - 15    Comment: Performed at [facility], [address] [place], [place] [address]  Lipase, blood     Status: None   Collection Time: 11/10/18  1:35 PM  Result Value Ref Range   Lipase 33 11 - 51 U/L    Comment: Performed at [facility], [address], [place], [place] [address]   Personally reviewed imaging- stones in the gallbladder but no evidence of thickening or fluid, CBD 6 mm  CT a/p renal 11-07-18 IMPRESSION: 1. No acute findings.  No findings to account for right flank pain. 2. Tiny nonobstructing stone in the upper pole the left  kidney stable from the prior CT. No other intrarenal stones. No ureteral stones or obstructive uropathy. 3. Density noted in the right upper quadrant on the current radiographs is presumably due to 1 of the gallstones. Multiple gallstones without evidence of acute cholecystitis. 4. Stable liver cysts. Numerous left colon diverticula. No diverticulitis or other bowel inflammatory process. Dense aortic atherosclerosis. US Abdomen Complete  Result Date: 11/10/2018 CLINICAL DATA:  Right flank pain for 2 days. EXAM: ABDOMEN ULTRASOUND COMPLETE COMPARISON:  Body CT 11/07/2018 FINDINGS: Gallbladder: No gallbladder wall thickening visualized. Gallstones, measuring up to 1.8 cm. No sonographic Murphy sign noted by sonographer. Common bile duct: Diameter: 6 mm Liver: Complicated cyst in the caudate lobe measures 1.2 x 0.9 x 0.8 cm. Within normal limits in parenchymal echogenicity. Portal vein is patent on color Doppler  imaging with normal direction of blood flow towards the liver. IVC: No abnormality visualized. Pancreas: Visualized portion unremarkable. Spleen: Size and appearance within normal limits. Right Kidney: Length: 11.8 cm. Diffuse cortical thinning. No mass or hydronephrosis visualized. Left Kidney: Length: 11.1 cm. Diffuse cortical thinning. No mass or hydronephrosis visualized. Abdominal aorta: No aneurysm visualized. Other findings: None. IMPRESSION: Large gallstones, including within gallbladder neck. No sonographic evidence of acute cholecystitis. Bilateral renal cortical thinning, nonspecific but may be associated with medical renal disease. Electronically Signed   By: Fidela Salisbury M.D.   On: 11/10/2018 14:55   Assessment & Plan:  DACOTA DEVALL is a 85 y.o. female with right flank pain of unknown etiology. She does not have typical biliary dyskinesia pain and is not tender in her abdomen. She is unsure of any injury. Given that this symptoms are not classic, I recommend we monitor and see if this improves. If it improves it is likely related to a muscle pain from a pull.   Take ibuprofen or aleve as needed for pain. Take the Norco as needed if not improving. Warm compress/ heating pad to the area.  Lidocaine patches can be bought over the counter and applied to the area.  Call if things worsening, if pain more in the belly or associated with nausea/vomiting.  All questions were answered to the satisfaction of the patient and family.  Future Appointments  Date Time Provider [place]  12/09/2018 11:30 AM Virl Cagey, MD [facility] None  12/22/2018 10:30 AM Chipper Herb, MD [facility] None  07/27/2019 11:00 AM Denyce Robert, RN [facility] None    Virl Cagey 11/11/2018, 12:31 PM

## 2018-11-11 NOTE — Patient Instructions (Signed)
Take ibuprofen or aleve as needed for pain. Take the Norco as needed if not improving. Warm compress/ heating pad to the area.  Lidocaine patches can be bought over the counter and applied to the area.  Call if things worsening, if pain more in the belly or associated with nausea/vomiting.   Musculoskeletal Pain Musculoskeletal pain refers to aches and pains in your bones, joints, muscles, and the tissues that surround them. This pain can occur in any part of the body. It can last for a short time (acute) or a long time (chronic). A physical exam, lab tests, and imaging studies may be done to find the cause of your musculoskeletal pain. Follow these instructions at home:  Lifestyle  Try to control or lower your stress levels. Stress increases muscle tension and can worsen musculoskeletal pain. It is important to recognize when you are anxious or stressed and learn ways to manage it. This may include: ? Meditation or yoga. ? Cognitive or behavioral therapy. ? Acupuncture or massage therapy.  You may continue all activities unless the activities cause more pain. When the pain gets better, slowly resume your normal activities. Gradually increase the intensity and duration of your activities or exercise. Managing pain, stiffness, and swelling  Take over-the-counter and prescription medicines only as told by your health care provider.  When your pain is severe, bed rest may be helpful. Lie or sit in any position that is comfortable, but get out of bed and walk around at least every couple of hours.  If directed, apply heat to the affected area as often as told by your health care provider. Use the heat source that your health care provider recommends, such as a moist heat pack or a heating pad. ? Place a towel between your skin and the heat source. ? Leave the heat on for 20-30 minutes. ? Remove the heat if your skin turns bright red. This is especially important if you are unable to feel  pain, heat, or cold. You may have a greater risk of getting burned.  If directed, put ice on the painful area. ? Put ice in a plastic bag. ? Place a towel between your skin and the bag. ? Leave the ice on for 20 minutes, 2-3 times a day. General instructions  Your health care provider may recommend that you see a physical therapist. This person can help you come up with a safe exercise program. Do any exercises as told by your physical therapist.  Keep all follow-up visits, including any physical therapy visits, as told by your health care providers. This is important. Contact a health care provider if:  Your pain gets worse.  Medicines do not help ease your pain.  You cannot use the part of your body that hurts, such as your arm, leg, or neck.  You have trouble sleeping.  You have trouble doing your normal activities. Get help right away if:  You have a new injury and your pain is worse or different.  You feel numb or you have tingling in the painful area. Summary  Musculoskeletal pain refers to aches and pains in your bones, joints, muscles, and the tissues that surround them.  This pain can occur in any part of the body.  Your health care provider may recommend that you see a physical therapist. This person can help you come up with a safe exercise program. Do any exercises as told by your physical therapist.  Lower your stress level. Stress can worsen musculoskeletal  pain. Ways to lower stress may include meditation, yoga, cognitive or behavioral therapy, acupuncture, and massage therapy. This information is not intended to replace advice given to you by your health care provider. Make sure you discuss any questions you have with your health care provider. Document Released: 08/20/2005 Document Revised: 09/19/2016 Document Reviewed: 09/19/2016 Elsevier Interactive Patient Education  2019 [employer].   Cholelithiasis  Cholelithiasis is also called "gallstones." It is  a kind of gallbladder disease. The gallbladder is an organ that stores a liquid (bile) that helps you digest fat. Gallstones may not cause symptoms (may be silent gallstones) until they cause a blockage, and then they can cause pain (gallbladder attack). Follow these instructions at home: Take over-the-counter and prescription medicines only as told by your doctor. Stay at a healthy weight. Eat healthy foods. This includes: Eating fewer fatty foods, like fried foods. Eating fewer refined carbs (refined carbohydrates). Refined carbs are breads and grains that are highly processed, like white bread and white rice. Instead, choose whole grains like whole-wheat bread and brown rice. Eating more fiber. Almonds, fresh fruit, and beans are healthy sources of fiber. Keep all follow-up visits as told by your doctor. This is important. Contact a doctor if: You have sudden pain in the upper right side of your belly (abdomen). Pain might spread to your right shoulder or your chest. This may be a sign of a gallbladder attack. You feel sick to your stomach (are nauseous). You throw up (vomit). You have been diagnosed with gallstones that have no symptoms and you get: Belly pain. Discomfort, burning, or fullness in the upper part of your belly (indigestion). Get help right away if: You have sudden pain in the upper right side of your belly, and it lasts for more than 2 hours. You have belly pain that lasts for more than 5 hours. You have a fever or chills. You keep feeling sick to your stomach or you keep throwing up. Your skin or the whites of your eyes turn yellow (jaundice). You have dark-colored pee (urine). You have light-colored poop (stool). Summary Cholelithiasis is also called "gallstones." The gallbladder is an organ that stores a liquid (bile) that helps you digest fat. Silent gallstones are gallstones that do not cause symptoms. A gallbladder attack may cause sudden pain in the upper right  side of your belly. Pain might spread to your right shoulder or your chest. If this happens, contact your doctor. If you have sudden pain in the upper right side of your belly that lasts for more than 2 hours, get help right away. This information is not intended to replace advice given to you by your health care provider. Make sure you discuss any questions you have with your health care provider. Document Released: 02/06/2008 Document Revised: 05/06/2016 Document Reviewed: 05/06/2016 Elsevier Interactive Patient Education  2019 [employer].

## 2018-11-13 ENCOUNTER — Ambulatory Visit: Payer: Medicare Other | Admitting: Family Medicine

## 2018-12-05 ENCOUNTER — Ambulatory Visit (INDEPENDENT_AMBULATORY_CARE_PROVIDER_SITE_OTHER): Payer: Medicare Other | Admitting: General Surgery

## 2018-12-05 DIAGNOSIS — R109 Unspecified abdominal pain: Secondary | ICD-10-CM | POA: Diagnosis not present

## 2018-12-05 NOTE — Progress Notes (Signed)
I connected with Jola Schmidt (Son) and  MARISSIA BLACKHAM on 12/05/18 by telephone due to the [place] pandemic and verified that I am speaking with the correct persons using two identifiers.   I discussed the limitations of evaluation and management by telemedicine. The patient expressed understanding and agreed to proceed.      [facility] Telephone Note   HPI:  85 y.o. Female who presented with right sided flank pain a few weeks ago.The patient is reporting that the lidocaine patches completely fixed her pain. She has had no issues or pain. She is eating and having no issues.   Review of Systems:  No pain No fever or chills Tolerating diet All other review of systems: otherwise negative   Vital Signs:  Telephone visit   Physical Exam:  Telephone visit   Assessment:  85 y.o. yo Female with right flank pain improved by the lidocaine patches. This is unlikely to be related to her gallbladder.   Plan:  - Monitor symptoms   - Notify us with issues or concerns  - PRN Follow up   All of the above recommendations were discussed with the patient and patient's family, and all of patient's and family's questions were answered to their expressed satisfaction. I spoke on the phone for 5 minutes regarding the patient's symptoms and plan.   Curlene Labrum, MD [facility] [address] [place], [place] [address] [phone] (office)

## 2018-12-09 ENCOUNTER — Ambulatory Visit: Payer: Medicare Other | Admitting: General Surgery

## 2018-12-11 ENCOUNTER — Ambulatory Visit: Payer: Medicare Other | Admitting: General Surgery

## 2018-12-22 ENCOUNTER — Ambulatory Visit (INDEPENDENT_AMBULATORY_CARE_PROVIDER_SITE_OTHER): Payer: Medicare Other | Admitting: Family Medicine

## 2018-12-22 ENCOUNTER — Other Ambulatory Visit: Payer: Self-pay

## 2018-12-22 DIAGNOSIS — I10 Essential (primary) hypertension: Secondary | ICD-10-CM

## 2018-12-22 DIAGNOSIS — R109 Unspecified abdominal pain: Secondary | ICD-10-CM

## 2018-12-22 DIAGNOSIS — K8020 Calculus of gallbladder without cholecystitis without obstruction: Secondary | ICD-10-CM

## 2018-12-22 DIAGNOSIS — E559 Vitamin D deficiency, unspecified: Secondary | ICD-10-CM

## 2018-12-22 DIAGNOSIS — I4819 Other persistent atrial fibrillation: Secondary | ICD-10-CM

## 2018-12-22 DIAGNOSIS — K222 Esophageal obstruction: Secondary | ICD-10-CM | POA: Diagnosis not present

## 2018-12-22 DIAGNOSIS — N390 Urinary tract infection, site not specified: Secondary | ICD-10-CM

## 2018-12-22 DIAGNOSIS — I700 Atherosclerosis of aorta: Secondary | ICD-10-CM | POA: Diagnosis not present

## 2018-12-22 DIAGNOSIS — E7800 Pure hypercholesterolemia, unspecified: Secondary | ICD-10-CM | POA: Diagnosis not present

## 2018-12-22 NOTE — Patient Instructions (Signed)
Continue to practice good hand and respiratory hygiene Continue to drink plenty of water and fluids and stay well-hydrated Continue to make every effort to not put herself at risk for falling Follow-up with cardiology yearly Continue with current treatment pending results of lab work and may consider at checking the CBC if the hemoglobin is high of reducing the iron medicine more and she is currently taking that 3 times weekly. Make every effort to not put herself at risk for falling and use walker or cane if necessary.

## 2018-12-22 NOTE — Addendum Note (Signed)
Addended by: Zannie Cove on: 12/22/2018 01:10 PM   Modules accepted: Orders

## 2018-12-22 NOTE — Progress Notes (Signed)
Virtual Visit Via telephone Note I connected with@ on 12/22/18 by telephone and verified that I am speaking with the correct person or authorized healthcare agent using two identifiers. Kari [name] is currently located at home and there are no unauthorized people in close proximity. I completed this visit while in a private location in my home .  I connected to the patient by telephone and verified that I was speaking with the correct person.  The patient's daughter [name] was also assisting the patient the response to questions asked of her.  This visit type was conducted due to national recommendations for restrictions regarding the COVID-19 Pandemic (e.g. social distancing).  This format is felt to be most appropriate for this patient at this time.  All issues noted in this document were discussed and addressed.  No physical exam was performed.    I discussed the limitations, risks, security and privacy concerns of performing an evaluation and management service by telephone and the availability of in person appointments. I also discussed with the patient that there may be a patient responsible charge related to this service. The patient expressed understanding and agreed to proceed.   Date:  12/22/2018    ID:  Kari [name]      [birth year]        [id number]   Patient Care Team Patient Care Team: Chipper Herb, MD as PCP - General (Family Medicine) Minus Breeding, MD as Consulting Physician (Cardiology) Gatha Mayer, MD as Consulting Physician (Gastroenterology) Penni Bombard, MD as Consulting Physician (Neurology)  Reason for Visit: Primary Care Follow-up     History of Present Illness & Review of Systems:     Kari [name] is a 85 y.o. year old female primary care patient that presents today for a telehealth visit.  The patient was alert and jovial which is normal for her.  She is doing very well.  The daughter was present during the conversation.  She is doing  well with her heart and follows up yearly with the cardiologist regarding her atrial fib and she is still on Eliquis for this.  She is staying active around the house.  She denies any shortness of breath anymore than usual.  She is having no trouble with swallowing heartburn indigestion or change in bowel habits and stools are formed and not loose.  She is drinking plenty of water daily and both of her children make sure that she drinks so many bottles of water a day.  Since she has been doing this she has had less urinary tract infections.  She has had some right flank pain and she does have some large gallstones but the surgeon thinks the back pain is not related to the gallstones.  She is currently having no back pain.  She did use a Lidoderm patch and this seemed to help.  All meds and allergies were reviewed and there were no changes.  She still gets her medications at [facility]    Review of systems as stated otherwise negative for body systems left unmentioned.   The patient does not have symptoms concerning for COVID-19 infection (fever, chills, cough, or new shortness of breath).      Current Medications (Verified) Allergies as of 12/22/2018      Reactions   Erythromycin Other (See Comments)   Reaction unknown   Penicillins Other (See Comments)   Reaction unknown  Has patient had a PCN reaction causing immediate rash, facial/tongue/throat  swelling, SOB or lightheadedness with hypotension: unknown Has patient had a PCN reaction causing severe rash involving mucus membranes or skin necrosis: unknown Has patient had a PCN reaction that required hospitalization : unknown Has patient had a PCN reaction occurring within the last 10 years: no If all of the above answers are "NO", then may proceed with Cephalosporin use.      Medication List       Accurate as of December 22, 2018 10:26 AM. Always use your most recent med list.        amLODipine 2.5 MG tablet Commonly known as:   NORVASC Take 1 tablet (2.5 mg total) by mouth daily.   atenolol 50 MG tablet Commonly known as:  TENORMIN Take 1 tablet (50 mg total) by mouth daily.   Benfotiamine 150 MG Caps Take 150 mg by mouth daily.   captopril 50 MG tablet Commonly known as:  CAPOTEN TAKE (1) TABLET TWICE A DAY FOR HIGH BLOOD PRESSURE.   donepezil 5 MG tablet Commonly known as:  ARICEPT TAKE ONE TABLET AT BEDTIME   Eliquis 5 MG Tabs tablet Generic drug:  apixaban Take 1 tablet (5 mg total) by mouth 2 (two) times daily.   ezetimibe 10 MG tablet Commonly known as:  ZETIA TAKE 1 TABLET ONCE DAILY FOR CHOLESTEROL   ferrous sulfate 325 (65 FE) MG tablet Take 325 mg by mouth daily.   HYDROcodone-acetaminophen 5-325 MG tablet Commonly known as:  NORCO/VICODIN Take 1 tablet by mouth every 6 (six) hours as needed for moderate pain.   potassium chloride 10 MEQ tablet Commonly known as:  K-DUR TAKE 1 TABLET DAILY   potassium chloride 10 MEQ tablet Commonly known as:  K-DUR TAKE 1 TABLET DAILY   potassium chloride 10 MEQ tablet Commonly known as:  K-DUR TAKE 1 TABLET DAILY   pravastatin 80 MG tablet Commonly known as:  PRAVACHOL TAKE 1 TABLET ONCE DAILY FOR CHOLESTEROL   Vitamin D (Ergocalciferol) 1.25 MG (50000 UT) Caps capsule Commonly known as:  DRISDOL TAKE 1 CAPSULE ONCE A WEEK           Allergies (Verified)    Erythromycin and Penicillins  Past Medical History Past Medical History:  Diagnosis Date  . Adenomatous polyps   . AF (atrial fibrillation) ([place])   . Cataract   . Gallstones   . Hx of adenomatous colonic polyps   . Hyperlipidemia    x5 years  . Hypertension    x5 years  . Kyphosis   . Metabolic syndrome X   . Obesity   . Osteoporosis   . Stroke ([place]) 07/2017  . Subdural hematoma ([place])   . Symptomatic menopausal or female climacteric states   . Vitamin D deficiency      Past Surgical History:  Procedure Laterality Date  . ABDOMINAL HYSTERECTOMY    . BLADDER  SUSPENSION    . COLONOSCOPY  multiple  . ESOPHAGEAL DILATION N/A 03/03/2018   Procedure: ESOPHAGEAL DILATION;  Surgeon: Rogene Houston, MD;  Location: [facility] SU[facility];  Service: Endoscopy;  Laterality: N/A;  . ESOPHAGOGASTRODUODENOSCOPY N/A 03/03/2018   Procedure: ESOPHAGOGASTRODUODENOSCOPY (EGD);  Surgeon: Rogene Houston, MD;  Location: [facility] SU[facility];  Service: Endoscopy;  Laterality: N/A;  . EYE SURGERY Bilateral 09/2015  . VAGINAL HYSTERECTOMY     total    Social History   Socioeconomic History  . Marital status: Married    Spouse name: Not on file  . Number of children: 2  . Years of  education: Not on file  . Highest education level: Not on file  Occupational History  . Occupation: [employer]  . Financial resource strain: Not on file  . Food insecurity:    Worry: Not on file    Inability: Not on file  . Transportation needs:    Medical: Not on file    Non-medical: Not on file  Tobacco Use  . Smoking status: Never Smoker  . Smokeless tobacco: Never Used  Substance and Sexual Activity  . Alcohol use: No  . Drug use: No  . Sexual activity: Not on file  Lifestyle  . Physical activity:    Days per week: Not on file    Minutes per session: Not on file  . Stress: Not on file  Relationships  . Social connections:    Talks on phone: Not on file    Gets together: Not on file    Attends religious service: Not on file    Active member of club or organization: Not on file    Attends meetings of clubs or organizations: Not on file    Relationship status: Not on file  Other Topics Concern  . Not on file  Social History Narrative   Lives with her husband and son, independent   1 son Surveyor, minerals)   1 Daughter Contractor)   2 grandchildren and 3 great-grandchild   12th grade education     Family History  Problem Relation Age of Onset  . Heart attack Mother   . Stroke Mother   . Heart attack Father   . Alcohol abuse Father   . Hypertension Sister   .  Stroke Sister   . Stroke Brother   . Hypertension Brother   . Heart attack Brother   . Hypertension Brother   . Depression Brother        suicide  . Hypertension Sister   . Heart attack Other   . Stroke Other   . Breast cancer Other   . Colon cancer Neg Hx       Labs/Other Tests and Data Reviewed:    Wt Readings from Last 3 Encounters:  11/11/18 156 lb 4.8 oz (70.9 kg)  11/10/18 154 lb 5.2 oz (70 kg)  11/06/18 155 lb (70.3 kg)   Temp Readings from Last 3 Encounters:  11/11/18 (!) 97.5 F (36.4 C) (Temporal)  11/10/18 98.3 F (36.8 C) (Oral)  11/06/18 (!) 97.1 F (36.2 C) (Oral)   BP Readings from Last 3 Encounters:  11/11/18 140/77  11/10/18 95/60  11/06/18 (!) 143/87   Pulse Readings from Last 3 Encounters:  11/11/18 70  11/10/18 67  11/06/18 (!) 107     Lab Results  Component Value Date   HGBA1C 6.0 02/08/2016   HGBA1C 5.8 02/02/2015   HGBA1C 5.9% 02/19/2014   Lab Results  Component Value Date   LDLCALC 74 08/18/2018   CREATININE 0.53 11/10/2018       Chemistry      Component Value Date/Time   NA 138 11/10/2018 1335   NA 139 08/18/2018 1217   K 3.3 (L) 11/10/2018 1335   CL 103 11/10/2018 1335   CO2 28 11/10/2018 1335   BUN 12 11/10/2018 1335   BUN 7 (L) 08/18/2018 1217   CREATININE 0.53 11/10/2018 1335   CREATININE 0.71 03/25/2013 0938      Component Value Date/Time   CALCIUM 9.2 11/10/2018 1335   ALKPHOS 65 11/10/2018 1335   AST 28 11/10/2018 1335   ALT  23 11/10/2018 1335   BILITOT 0.7 11/10/2018 1335   BILITOT 0.6 08/18/2018 1219         OBSERVATIONS/ OBJECTIVE:     The patient's alertness seemed to be stable and consistent with past and office visits.  She is jovial and in good spirits.  As mentioned and responses to questions were handled mostly by her daughter with the patient being present.  No refills were needed.  She will continue to practice good respiratory and hand hygiene.  Home blood pressure readings were in the 130s  to 140s over the 70s.  There was no edema.  Her weight was stable according to the daughter.  Physical exam deferred due to nature of telephonic visit.  ASSESSMENT & PLAN    Time:   Today, I have spent 25 minutes with the patient via telephone discussing the above including Covid precautions.     Visit Diagnoses: 1. Gallstones -No more problems with back pain and no nausea and this will continue to be monitored by the surgeon.  2. Acute right flank pain -This has resolved with Lidoderm patches and Tylenol.  3. Pure hypercholesterolemia -Continue with pravastatin therapy and therapeutic lifestyle changes  4. Thoracic aortic atherosclerosis ([place]) -Continue with statin therapy and therapeutic lifestyle changes  5. Essential hypertension -Blood pressures at home were good in the 1 30-1 40 range over the 70s.  She will continue with current treatment  6. Persistent atrial fibrillation -Continue with Eliquis and follow-up with cardiology as planned  7. Esophageal stricture -Follow-up with GI as planned and if needed.  Patient is currently swallowing well without problems.  8. Recurrent UTI -Continue to drink plenty of water and fluids  9. Aortic atherosclerosis ([place]) -Follow as aggressive therapeutic lifestyle changes as possible  10. Right flank pain -This has resolved.  Patient Instructions  Continue to practice good hand and respiratory hygiene Continue to drink plenty of water and fluids and stay well-hydrated Continue to make every effort to not put herself at risk for falling Follow-up with cardiology yearly Continue with current treatment pending results of lab work and may consider at checking the CBC if the hemoglobin is high of reducing the iron medicine more and she is currently taking that 3 times weekly. Make every effort to not put herself at risk for falling and use walker or cane if necessary.       The above assessment and management plan was discussed  with the patient. The patient verbalized understanding of and has agreed to the management plan. Patient is aware to call the clinic if symptoms persist or worsen. Patient is aware when to return to the clinic for a follow-up visit. Patient educated on when it is appropriate to go to the emergency department.    Chipper Herb, MD [address], [place], [place] [address] Ph [phone]   Arrie Senate MD

## 2018-12-28 ENCOUNTER — Encounter: Payer: Self-pay | Admitting: General Surgery

## 2019-01-01 ENCOUNTER — Other Ambulatory Visit: Payer: Self-pay | Admitting: Family Medicine

## 2019-01-13 ENCOUNTER — Other Ambulatory Visit: Payer: Self-pay | Admitting: Family Medicine

## 2019-01-19 ENCOUNTER — Other Ambulatory Visit: Payer: Self-pay | Admitting: Family Medicine

## 2019-02-03 ENCOUNTER — Other Ambulatory Visit: Payer: Self-pay

## 2019-02-03 ENCOUNTER — Telehealth: Payer: Self-pay | Admitting: Family Medicine

## 2019-02-03 ENCOUNTER — Other Ambulatory Visit: Payer: Medicare Other

## 2019-02-03 ENCOUNTER — Other Ambulatory Visit: Payer: Self-pay | Admitting: *Deleted

## 2019-02-03 DIAGNOSIS — I10 Essential (primary) hypertension: Secondary | ICD-10-CM

## 2019-02-03 DIAGNOSIS — E7800 Pure hypercholesterolemia, unspecified: Secondary | ICD-10-CM | POA: Diagnosis not present

## 2019-02-03 DIAGNOSIS — I700 Atherosclerosis of aorta: Secondary | ICD-10-CM

## 2019-02-03 DIAGNOSIS — E559 Vitamin D deficiency, unspecified: Secondary | ICD-10-CM

## 2019-02-03 NOTE — Telephone Encounter (Signed)
Lab orders placed.  

## 2019-02-04 LAB — VITAMIN D 25 HYDROXY (VIT D DEFICIENCY, FRACTURES): Vit D, 25-Hydroxy: 59.1 ng/mL (ref 30.0–100.0)

## 2019-02-04 LAB — BMP8+EGFR
BUN/Creatinine Ratio: 13 (ref 12–28)
BUN: 8 mg/dL (ref 8–27)
CO2: 28 mmol/L (ref 20–29)
Calcium: 9.6 mg/dL (ref 8.7–10.3)
Chloride: 99 mmol/L (ref 96–106)
Creatinine, Ser: 0.64 mg/dL (ref 0.57–1.00)
GFR calc Af Amer: 96 mL/min/{1.73_m2} (ref >59)
GFR calc non Af Amer: 83 mL/min/{1.73_m2} (ref >59)
Glucose: 107 mg/dL — ABNORMAL HIGH (ref 65–99)
Potassium: 3.7 mmol/L (ref 3.5–5.2)
Sodium: 141 mmol/L (ref 134–144)

## 2019-02-04 LAB — HEPATIC FUNCTION PANEL
ALT: 19 IU/L (ref 0–32)
AST: 24 IU/L (ref 0–40)
Albumin: 4.4 g/dL (ref 3.6–4.6)
Alkaline Phosphatase: 80 IU/L (ref 39–117)
Bilirubin Total: 0.5 mg/dL (ref 0.0–1.2)
Bilirubin, Direct: 0.16 mg/dL (ref 0.00–0.40)
Total Protein: 7.3 g/dL (ref 6.0–8.5)

## 2019-02-04 LAB — CBC WITH DIFFERENTIAL/PLATELET
Basophils Absolute: 0 10*3/uL (ref 0.0–0.2)
Basos: 1 %
EOS (ABSOLUTE): 0.1 10*3/uL (ref 0.0–0.4)
Eos: 2 %
Hematocrit: 46.8 % — ABNORMAL HIGH (ref 34.0–46.6)
Hemoglobin: 15.4 g/dL (ref 11.1–15.9)
Immature Grans (Abs): 0 10*3/uL (ref 0.0–0.1)
Immature Granulocytes: 0 %
Lymphocytes Absolute: 1.6 10*3/uL (ref 0.7–3.1)
Lymphs: 22 %
MCH: 30.5 pg (ref 26.6–33.0)
MCHC: 32.9 g/dL (ref 31.5–35.7)
MCV: 93 fL (ref 79–97)
Monocytes Absolute: 0.6 10*3/uL (ref 0.1–0.9)
Monocytes: 8 %
Neutrophils Absolute: 5.1 10*3/uL (ref 1.4–7.0)
Neutrophils: 67 %
Platelets: 214 10*3/uL (ref 150–450)
RBC: 5.05 x10E6/uL (ref 3.77–5.28)
RDW: 13.4 % (ref 11.7–15.4)
WBC: 7.4 10*3/uL (ref 3.4–10.8)

## 2019-02-04 LAB — LIPID PANEL
Chol/HDL Ratio: 3.3 ratio (ref 0.0–4.4)
Cholesterol, Total: 136 mg/dL (ref 100–199)
HDL: 41 mg/dL (ref >39)
LDL Calculated: 63 mg/dL (ref 0–99)
Triglycerides: 161 mg/dL — ABNORMAL HIGH (ref 0–149)
VLDL Cholesterol Cal: 32 mg/dL (ref 5–40)

## 2019-02-05 ENCOUNTER — Telehealth: Payer: Self-pay | Admitting: Family Medicine

## 2019-02-05 NOTE — Telephone Encounter (Signed)
Please call pt daughter at her work to give her lab results on her mother, daugther missed the call earlier

## 2019-02-05 NOTE — Telephone Encounter (Signed)
[name] aware

## 2019-02-20 ENCOUNTER — Other Ambulatory Visit: Payer: Self-pay | Admitting: Family Medicine

## 2019-03-10 DIAGNOSIS — L84 Corns and callosities: Secondary | ICD-10-CM | POA: Diagnosis not present

## 2019-03-10 DIAGNOSIS — M79676 Pain in unspecified toe(s): Secondary | ICD-10-CM | POA: Diagnosis not present

## 2019-03-10 DIAGNOSIS — B351 Tinea unguium: Secondary | ICD-10-CM | POA: Diagnosis not present

## 2019-03-10 DIAGNOSIS — I70203 Unspecified atherosclerosis of native arteries of extremities, bilateral legs: Secondary | ICD-10-CM | POA: Diagnosis not present

## 2019-04-08 ENCOUNTER — Other Ambulatory Visit: Payer: Self-pay | Admitting: *Deleted

## 2019-04-08 MED ORDER — ATENOLOL 50 MG PO TABS: 50.0000 mg | ORAL_TABLET | Freq: Every day | ORAL | 0 refills | Status: DC

## 2019-04-10 ENCOUNTER — Other Ambulatory Visit: Payer: Self-pay | Admitting: *Deleted

## 2019-04-10 MED ORDER — AMLODIPINE BESYLATE 2.5 MG PO TABS: 2.5000 mg | ORAL_TABLET | Freq: Every day | ORAL | 0 refills | Status: DC

## 2019-04-10 MED ORDER — DONEPEZIL HCL 5 MG PO TABS: 5.0000 mg | ORAL_TABLET | Freq: Every day | ORAL | 2 refills | Status: DC

## 2019-05-05 ENCOUNTER — Telehealth: Payer: Self-pay | Admitting: Family Medicine

## 2019-05-05 NOTE — Telephone Encounter (Signed)
FAXED

## 2019-05-18 ENCOUNTER — Ambulatory Visit (INDEPENDENT_AMBULATORY_CARE_PROVIDER_SITE_OTHER): Payer: Medicare Other | Admitting: Family Medicine

## 2019-05-18 ENCOUNTER — Encounter: Payer: Self-pay | Admitting: Family Medicine

## 2019-05-18 ENCOUNTER — Other Ambulatory Visit: Payer: Self-pay

## 2019-05-18 VITALS — BP 175/85 | HR 52 | Temp 98.0°F | Resp 18 | Ht 62.0 in | Wt 154.0 lb

## 2019-05-18 DIAGNOSIS — I4891 Unspecified atrial fibrillation: Secondary | ICD-10-CM | POA: Diagnosis not present

## 2019-05-18 DIAGNOSIS — I700 Atherosclerosis of aorta: Secondary | ICD-10-CM

## 2019-05-18 DIAGNOSIS — Z23 Encounter for immunization: Secondary | ICD-10-CM | POA: Diagnosis not present

## 2019-05-18 DIAGNOSIS — F0150 Vascular dementia without behavioral disturbance: Secondary | ICD-10-CM

## 2019-05-18 DIAGNOSIS — I10 Essential (primary) hypertension: Secondary | ICD-10-CM

## 2019-05-18 MED ORDER — DONEPEZIL HCL 10 MG PO TABS: 10.0000 mg | ORAL_TABLET | Freq: Every day | ORAL | 0 refills | Status: DC

## 2019-05-18 NOTE — Progress Notes (Signed)
Subjective: CC: afib, memory PCP: [name], DO [id number] Kari [name] is a 85 y.o. female who is accompanied by her daughter today presenting to clinic today for:  1. Afib/ HTN Reports compliance with atenolol, captopril, Eliquis and amlodipine.  No chest pain, shortness of breath, lower extremity edema.  No dizziness, hematochezia, melena.  No falls.  2. Memory loss Her daughter notes that this has been present for well over a year.  She was started on Aricept about 6 to 8 months ago and has been doing fine on it.  She denies any falls, confusion, mood disturbance.  She is independent in her ADLs and some of her IADLs.  Her husband manages the finances and patient does not drive as she is had a history of stroke.  She has rare incontinence and does wear adult diapers but often is able to make it to the bathroom independently.  No POA. No advanced directive   ROS: Per HPI  Allergies  Allergen Reactions  . Erythromycin Other (See Comments)    Reaction unknown  . Penicillins Other (See Comments)    Reaction unknown  Has patient had a PCN reaction causing immediate rash, facial/tongue/throat swelling, SOB or lightheadedness with hypotension: unknown Has patient had a PCN reaction causing severe rash involving mucus membranes or skin necrosis: unknown Has patient had a PCN reaction that required hospitalization : unknown Has patient had a PCN reaction occurring within the last 10 years: no If all of the above answers are "NO", then may proceed with Cephalosporin use.    Past Medical History:  Diagnosis Date  . Adenomatous polyps   . AF (atrial fibrillation) ([place])   . Cataract   . Gallstones   . Hx of adenomatous colonic polyps   . Hyperlipidemia    x5 years  . Hypertension    x5 years  . Kyphosis   . Metabolic syndrome X   . Obesity   . Osteoporosis   . Stroke ([place]) 07/2017  . Subdural hematoma ([place])   . Symptomatic menopausal or female climacteric states   .  Vitamin D deficiency     Current Outpatient Medications:  .  amLODipine (NORVASC) 2.5 MG tablet, Take 1 tablet (2.5 mg total) by mouth daily., Disp: 90 tablet, Rfl: 0 .  atenolol (TENORMIN) 50 MG tablet, Take 1 tablet (50 mg total) by mouth daily., Disp: 90 tablet, Rfl: 0 .  Benfotiamine 150 MG CAPS, Take 150 mg by mouth daily., Disp: , Rfl:  .  captopril (CAPOTEN) 50 MG tablet, TAKE (1) TABLET TWICE A DAY FOR HIGH BLOOD PRESSURE., Disp: 60 tablet, Rfl: 3 .  donepezil (ARICEPT) 5 MG tablet, Take 1 tablet (5 mg total) by mouth at bedtime., Disp: 30 tablet, Rfl: 2 .  ELIQUIS 5 MG TABS tablet, Take 1 tablet (5 mg total) by mouth 2 (two) times daily., Disp: 60 tablet, Rfl: 11 .  ezetimibe (ZETIA) 10 MG tablet, TAKE 1 TABLET ONCE DAILY FOR CHOLESTEROL, Disp: 90 tablet, Rfl: 1 .  ferrous sulfate 325 (65 FE) MG tablet, Take 325 mg by mouth daily., Disp: , Rfl:  .  potassium chloride (K-DUR) 10 MEQ tablet, TAKE 1 TABLET DAILY, Disp: 30 tablet, Rfl: 4 .  pravastatin (PRAVACHOL) 80 MG tablet, TAKE 1 TABLET ONCE DAILY FOR CHOLESTEROL, Disp: 90 tablet, Rfl: 1 .  Vitamin D, Ergocalciferol, (DRISDOL) 1.25 MG (50000 UT) CAPS capsule, TAKE 1 CAPSULE ONCE A WEEK, Disp: 12 capsule, Rfl: 3 Social History   Socioeconomic History  .  Marital status: Married    Spouse name: Not on file  . Number of children: 2  . Years of education: Not on file  . Highest education level: Not on file  Occupational History  . Occupation: [employer]  . Financial resource strain: Not on file  . Food insecurity    Worry: Not on file    Inability: Not on file  . Transportation needs    Medical: Not on file    Non-medical: Not on file  Tobacco Use  . Smoking status: Never Smoker  . Smokeless tobacco: Never Used  Substance and Sexual Activity  . Alcohol use: No  . Drug use: No  . Sexual activity: Not on file  Lifestyle  . Physical activity    Days per week: Not on file    Minutes per session: Not on file  .  Stress: Not on file  Relationships  . Social Herbalist on phone: Not on file    Gets together: Not on file    Attends religious service: Not on file    Active member of club or organization: Not on file    Attends meetings of clubs or organizations: Not on file    Relationship status: Not on file  . Intimate partner violence    Fear of current or ex partner: Not on file    Emotionally abused: Not on file    Physically abused: Not on file    Forced sexual activity: Not on file  Other Topics Concern  . Not on file  Social History Narrative   Lives with her husband and son, independent   1 son Surveyor, minerals)   1 Daughter Contractor)   2 grandchildren and 3 great-grandchild   12th grade education   Family History  Problem Relation Age of Onset  . Heart attack Mother   . Stroke Mother   . Heart attack Father   . Alcohol abuse Father   . Hypertension Sister   . Stroke Sister   . Stroke Brother   . Hypertension Brother   . Heart attack Brother   . Hypertension Brother   . Depression Brother        suicide  . Hypertension Sister   . Heart attack Other   . Stroke Other   . Breast cancer Other   . Colon cancer Neg Hx     Objective: Office vital signs reviewed. BP (!) 175/85   Pulse (!) 52   Temp 98 F (36.7 C)   Resp 18   Ht 5\' 2"  (1.575 m)   Wt 154 lb (69.9 kg)   SpO2 98%   BMI 28.17 kg/m   Physical Examination:  General: Awake, alert, well nourished, well groomed. No acute distress HEENT: Normal, sclera white, MMM Cardio: irregularly irregular w/ rate control. S1S2 heard, no murmurs appreciated Pulm: clear to auscultation bilaterally, no wheezes, rhonchi or rales; normal work of breathing on room air Extremities: warm, well perfused, No edema, cyanosis or clubbing; +2 pulses bilaterally MSK: uses cane for ambulation Psych: mood stable, speech normal.  Pleasant. Does not appear to be responding to internal stimuli Neuro: AOx3  MMSE - Mini  Mental State Exam 07/01/2018 02/27/2017 02/08/2016  Orientation to time 4 5 5   Orientation to Place 4 5 5   Registration 3 3 3   Attention/ Calculation 5 5 4   Recall 0 2 2  Language- name 2 objects 2 2 2   Language- repeat 1 1 1   Language-  follow 3 step command 3 3 3   Language- read & follow direction 1 1 1   Write a sentence 1 1 1   Copy design 0 1 1  Total score 24 29 28     Assessment/ Plan: 85 y.o. female   1. Essential hypertension Not controlled during today's visit.  Per her report her cardiologist prefers her to stay towards the higher end of normal.  She has just taken her blood pressure medicine so I suspect that has a lot to do with the elevation today.  She will see me back in 1 month.  For now, no change in regimen  2. Atrial fibrillation, unspecified type ([place]) Rate controlled.  No red flags with bleeding  3. Aortic atherosclerosis ([facility]) Continue statin  4. Vascular dementia without behavioral disturbance ([place]) I suspect that the cognitive impairment she has is related to vascular changes given history of stroke, ongoing hypertension and atrial fibrillation.  I have increased her Aricept to 10 mg.  She has had slight progression since her check last year with her MMS E score dropping from 24-23.  We also discussed consideration for addition of Namenda.  We will discuss this further in 1 month.  I have also advised them to go ahead and discuss living will and advanced directive.  We had quite a bit of a conversation with regards to this during today's visit.  They will bring a copy of this and at her next visit. - donepezil (ARICEPT) 10 MG tablet; Take 1 tablet (10 mg total) by mouth at bedtime.  Dispense: 90 tablet; Refill: 0   No orders of the defined types were placed in this encounter.  No orders of the defined types were placed in this encounter.    [name], DO [place] [phone]

## 2019-05-18 NOTE — Patient Instructions (Addendum)
We have increased her Aricept (memory pill) to 10mg  every night at bedtime.  If you have 5mg  left over, you can just have her take 2 to use up your current supply.  Then start the ONE tablet 10mg  I sent in today.  If she does not already have an advanced directive (living will), please take time to discuss this with her.  I prefer that this be done as early as possible, since dementia is often progressive.    See me in 3 months for recheck.     Dementia Caregiver Guide Dementia is a term used to describe a number of symptoms that affect memory and thinking. The most common symptoms include:  Memory loss.  Trouble with language and communication.  Trouble concentrating.  Poor judgment.  Problems with reasoning.  Child-like behavior and language.  Extreme anxiety.  Angry outbursts.  Wandering from home or public places. Dementia usually gets worse slowly over time. In the early stages, people with dementia can stay independent and safe with some help. In later stages, they need help with daily tasks such as dressing, grooming, and using the bathroom. How to help the person with dementia cope Dementia can be frightening and confusing. Here are some tips to help the person with dementia cope with changes caused by the disease. General tips  Keep the person on track with his or her routine.  Try to identify areas where the person may need help.  Be supportive, patient, calm, and encouraging.  Gently remind the person that adjusting to changes takes time.  Help with the tasks that the person has asked for help with.  Keep the person involved in daily tasks and decisions as much as possible.  Encourage conversation, but try not to get frustrated or harried if the person struggles to find words or does not seem to appreciate your help. Communication tips  When the person is talking or seems frustrated, make eye contact and hold the person's hand.  Ask specific questions  that need yes or no answers.  Use simple words, short sentences, and a calm voice. Only give one direction at a time.  When offering choices, limit them to just 1 or 2.  Avoid correcting the person in a negative way.  If the person is struggling to find the right words, gently try to help him or her. How to recognize symptoms of stress Symptoms of stress in caregivers include:  Feeling frustrated or angry with the person with dementia.  Denying that the person has dementia or that his or her symptoms will not improve.  Feeling hopeless and unappreciated.  Difficulty sleeping.  Difficulty concentrating.  Feeling anxious, irritable, or depressed.  Developing stress-related health problems.  Feeling like you have too little time for your own life. Follow these instructions at home:   Make sure that you and the person you are caring for: ? Get regular sleep. ? Exercise regularly. ? Eat regular, nutritious meals. ? Drink enough fluid to keep your urine clear or pale yellow. ? Take over-the-counter and prescription medicines only as told by your health care providers. ? Attend all scheduled health care appointments.  Join a support group with others who are caregivers.  Ask about respite care resources so that you can have a regular break from the stress of caregiving.  Look for signs of stress in yourself and in the person you are caring for. If you notice signs of stress, take steps to manage it.  Consider any safety  risks and take steps to avoid them.  Organize medications in a pill box for each day of the week.  Create a plan to handle any legal or financial matters. Get legal or financial advice if needed.  Keep a calendar in a central location to remind the person of appointments or other activities. Tips for reducing the risk of injury  Keep floors clear of clutter. Remove rugs, magazine racks, and floor lamps.  Keep hallways well lit, especially at  night.  Put a handrail and nonslip mat in the bathtub or shower.  Put childproof locks on cabinets that contain dangerous items, such as medicines, alcohol, guns, toxic cleaning items, sharp tools or utensils, matches, and lighters.  Put the locks in places where the person cannot see or reach them easily. This will help ensure that the person does not wander out of the house and get lost.  Be prepared for emergencies. Keep a list of emergency phone numbers and addresses in a convenient area.  Remove car keys and lock garage doors so that the person does not try to get in the car and drive.  Have the person wear a bracelet that tracks locations and identifies the person as having memory problems. This should be worn at all times for safety. Where to find support: Many individuals and organizations offer support. These include:  Support groups for people with dementia and for caregivers.  Counselors or therapists.  Home health care services.  Adult day care centers. Where to find more information Alzheimer's Association: [place] Contact a health care provider if:  The person's health is rapidly getting worse.  You are no longer able to care for the person.  Caring for the person is affecting your physical and emotional health.  The person threatens himself or herself, you, or anyone else. Summary  Dementia is a term used to describe a number of symptoms that affect memory and thinking.  Dementia usually gets worse slowly over time.  Take steps to reduce the person's risk of injury, and to plan for future care.  Caregivers need support, relief from caregiving, and time for their own lives. This information is not intended to replace advice given to you by your health care provider. Make sure you discuss any questions you have with your health care provider. Document Released: 07/24/2016 Document Revised: 08/02/2017 Document Reviewed: 07/24/2016 Elsevier Patient  Education  2020 [employer].

## 2019-05-19 DIAGNOSIS — M79676 Pain in unspecified toe(s): Secondary | ICD-10-CM | POA: Diagnosis not present

## 2019-05-19 DIAGNOSIS — L84 Corns and callosities: Secondary | ICD-10-CM | POA: Diagnosis not present

## 2019-05-19 DIAGNOSIS — B351 Tinea unguium: Secondary | ICD-10-CM | POA: Diagnosis not present

## 2019-05-19 DIAGNOSIS — I70203 Unspecified atherosclerosis of native arteries of extremities, bilateral legs: Secondary | ICD-10-CM | POA: Diagnosis not present

## 2019-05-28 ENCOUNTER — Other Ambulatory Visit: Payer: Self-pay | Admitting: Family Medicine

## 2019-06-25 ENCOUNTER — Other Ambulatory Visit: Payer: Self-pay | Admitting: Family Medicine

## 2019-07-02 ENCOUNTER — Other Ambulatory Visit: Payer: Self-pay | Admitting: Physician Assistant

## 2019-07-07 ENCOUNTER — Ambulatory Visit: Payer: Medicare Other | Admitting: Family Medicine

## 2019-07-09 ENCOUNTER — Other Ambulatory Visit: Payer: Self-pay | Admitting: Family Medicine

## 2019-07-16 ENCOUNTER — Other Ambulatory Visit: Payer: Self-pay | Admitting: Family Medicine

## 2019-07-23 ENCOUNTER — Other Ambulatory Visit: Payer: Self-pay | Admitting: *Deleted

## 2019-07-23 MED ORDER — CAPTOPRIL 50 MG PO TABS: ORAL_TABLET | ORAL | 2 refills | Status: DC

## 2019-08-03 ENCOUNTER — Ambulatory Visit: Payer: Medicare Other | Admitting: Family Medicine

## 2019-08-04 NOTE — Progress Notes (Signed)
HPI The patient presents for followup of atrial fibrillation.  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  She was switched to Eliquis.  She was seen once in our office in follow up after that.  She was in the hospital in Dec 2018 after a fall and subdural hematoma.  She was off the Eliquis.  I talked to neurosurgery and I was given the OK to restart DOAC.   She returns for follow up.    She is doing well since I last saw her. The patient denies any new symptoms such as chest discomfort, neck or arm discomfort. There has been no new shortness of breath, PND or orthopnea. There have been no reported palpitations, presyncope or syncope.  She walks with a cane and has had no further falls.   Allergies  Allergen Reactions  . Erythromycin Other (See Comments)    Reaction unknown  . Penicillins Other (See Comments)    Reaction unknown  Has patient had a PCN reaction causing immediate rash, facial/tongue/throat swelling, SOB or lightheadedness with hypotension: unknown Has patient had a PCN reaction causing severe rash involving mucus membranes or skin necrosis: unknown Has patient had a PCN reaction that required hospitalization : unknown Has patient had a PCN reaction occurring within the last 10 years: no If all of the above answers are "NO", then may proceed with Cephalosporin use.     Current Outpatient Medications  Medication Sig Dispense Refill  . amLODipine (NORVASC) 2.5 MG tablet Take 1 tablet (2.5 mg total) by mouth daily. 90 tablet 0  . atenolol (TENORMIN) 50 MG tablet TAKE 1 TABLET DAILY 90 tablet 0  . Benfotiamine 150 MG CAPS Take 150 mg by mouth daily.    . captopril (CAPOTEN) 50 MG tablet TAKE (1) TABLET TWICE A DAY FOR HIGH BLOOD PRESSURE. 60 tablet 2  . donepezil (ARICEPT) 10 MG tablet Take 1 tablet (10 mg total) by mouth at bedtime. 90 tablet 0  . ELIQUIS 5 MG TABS tablet Take 1 tablet (5 mg total) by mouth 2 (two)  times daily. 60 tablet 11  . ezetimibe (ZETIA) 10 MG tablet TAKE 1 TABLET ONCE DAILY FOR CHOLESTEROL 90 tablet 0  . ferrous sulfate 325 (65 FE) MG tablet Take 325 mg by mouth daily.    . potassium chloride (K-DUR) 10 MEQ tablet TAKE 1 TABLET DAILY 30 tablet 4  . pravastatin (PRAVACHOL) 80 MG tablet TAKE 1 TABLET ONCE DAILY FOR CHOLESTEROL 90 tablet 1  . Vitamin D, Ergocalciferol, (DRISDOL) 1.25 MG (50000 UT) CAPS capsule TAKE 1 CAPSULE ONCE A WEEK 12 capsule 0   No current facility-administered medications for this visit.     Past Medical History:  Diagnosis Date  . Adenomatous polyps   . AF (atrial fibrillation) ([place])   . Cataract   . Gallstones   . Hx of adenomatous colonic polyps   . Hyperlipidemia    x5 years  . Hypertension    x5 years  . Kyphosis   . Metabolic syndrome X   . Obesity   . Osteoporosis   . Stroke ([place]) 07/2017  . Subdural hematoma ([place])   . Symptomatic menopausal or female climacteric states   . Vitamin D deficiency     Past Surgical History:  Procedure Laterality Date  . ABDOMINAL HYSTERECTOMY    . BLADDER SUSPENSION    . COLONOSCOPY  multiple  . ESOPHAGEAL DILATION N/A 03/03/2018   Procedure:  ESOPHAGEAL DILATION;  Surgeon: Rogene Houston, MD;  Location: [facility];  Service: Endoscopy;  Laterality: N/A;  . ESOPHAGOGASTRODUODENOSCOPY N/A 03/03/2018   Procedure: ESOPHAGOGASTRODUODENOSCOPY (EGD);  Surgeon: Rogene Houston, MD;  Location: [facility];  Service: Endoscopy;  Laterality: N/A;  . EYE SURGERY Bilateral 09/2015  . VAGINAL HYSTERECTOMY     total    ROS:  As stated in the HPI and negative for all other systems.  PHYSICAL EXAM BP 98/65   Pulse 73   Ht 5\' 2"  (1.575 m)   Wt 156 lb (70.8 kg)   BMI 28.53 kg/m   GENERAL:  Well appearing NECK:  No jugular venous distention, waveform within normal limits, carotid upstroke brisk and symmetric, no bruits, no thyromegaly LUNGS:  Clear to auscultation bilaterally CHEST:  Unremarkable HEART:   PMI not displaced or sustained,S1 and S2 within normal limits, no S3, no clicks, no rubs, no murmurs, irregular ABD:  Flat, positive bowel sounds normal in frequency in pitch, no bruits, no rebound, no guarding, no midline pulsatile mass, no hepatomegaly, no splenomegaly EXT:  2 plus pulses throughout, no edema, no cyanosis no clubbing   EKG: Atrial fibrillation, rate 73, axis within normal limits, intervals within normal limits, inferolateral T wave changes unchanged from previous.  08/05/2019    ASSESSMENT AND PLAN  ATRIAL FIBRILLATION:  Ms. EMPRESS CONTI has a CHA2DS2 - VASc score of 5.   No change in therapy.   HTN: Her blood pressure is actually running low and I will stop her Norvasc.  He will call me if it continues to run low and I might be able to cut back on the Cozaar.  FOOT PAIN:   This was better when treated withbenfotiamine.     COVID EDUCATION: We talked about the vaccine.

## 2019-08-05 ENCOUNTER — Other Ambulatory Visit: Payer: Self-pay

## 2019-08-05 ENCOUNTER — Ambulatory Visit (INDEPENDENT_AMBULATORY_CARE_PROVIDER_SITE_OTHER): Payer: Medicare Other | Admitting: Cardiology

## 2019-08-05 ENCOUNTER — Encounter: Payer: Self-pay | Admitting: Cardiology

## 2019-08-05 ENCOUNTER — Telehealth: Payer: Self-pay | Admitting: Family Medicine

## 2019-08-05 VITALS — BP 98/65 | HR 73 | Ht 62.0 in | Wt 156.0 lb

## 2019-08-05 DIAGNOSIS — Z7189 Other specified counseling: Secondary | ICD-10-CM

## 2019-08-05 DIAGNOSIS — I482 Chronic atrial fibrillation, unspecified: Secondary | ICD-10-CM | POA: Diagnosis not present

## 2019-08-05 DIAGNOSIS — I10 Essential (primary) hypertension: Secondary | ICD-10-CM

## 2019-08-05 NOTE — Chronic Care Management (AMB) (Signed)
°  Chronic Care Management   Outreach Note  08/05/2019 Name: Kari [name] MRN: [id number] DOB: 10-28-1933  Referred by: [name], DO Reason for referral : Chronic Care Management (Initial CCM outreach was unsuccessful.)   An unsuccessful telephone outreach was attempted today. The patient was referred to the case management team by for assistance with care management and care coordination.   Follow Up Plan: A HIPPA compliant phone message was left for the patient providing contact information and requesting a return call.  The care management team will reach out to the patient again over the next 7 days.  If patient returns call to provider office, please advise to call [facility] at [place], [place] Management  [place], [place] [address] Direct Dial: [email or web address]  Website: [email or web address]

## 2019-08-05 NOTE — Patient Instructions (Signed)
Medication Instructions:  Please discontinue you Norvasc.  Continue all other medications as listed.  *If you need a refill on your cardiac medications before your next appointment, please call your pharmacy*  Follow-Up: At [facility], you and your health needs are our priority.  As part of our continuing mission to provide you with exceptional heart care, we have created designated Provider Care Teams.  These Care Teams include your primary Cardiologist (physician) and Advanced Practice Providers (APPs -  Physician Assistants and Nurse Practitioners) who all work together to provide you with the care you need, when you need it.  Your next appointment:   1 year(s)  The format for your next appointment:   In Person  Provider:   Minus Breeding, MD  Thank you for choosing [facility]!!

## 2019-08-11 NOTE — Chronic Care Management (AMB) (Signed)
Chronic Care Management   Note  08/11/2019 Name: [name] MRN: [id number] DOB: [birth year]  Kari [name] is a 85 y.o. year old female who is a primary care patient of [name], DO. I reached out to [name] by phone today in response to a referral sent by [name]'s health plan.     Kari [name] was given information about Chronic Care Management services today including:  1. CCM service includes personalized support from designated clinical staff supervised by her physician, including individualized plan of care and coordination with other care providers 2. 24/7 contact phone numbers for assistance for urgent and routine care needs. 3. Service will only be billed when office clinical staff spend 20 minutes or more in a month to coordinate care. 4. Only one practitioner may furnish and bill the service in a calendar month. 5. The patient may stop CCM services at any time (effective at the end of the month) by phone call to the office staff. 6. The patient will be responsible for cost sharing (co-pay) of up to 20% of the service fee (after annual deductible is met).  [name] did not agree to enrollment in care management services and does not wish to consider at this time.  Follow up plan: The patient has been provided with contact information for the chronic care management team and has been advised to call with any health related questions or concerns.   [place], [place] [address] Direct Dial: [email or web address]  Website: [email or web address]

## 2019-08-17 ENCOUNTER — Other Ambulatory Visit: Payer: Self-pay

## 2019-08-18 ENCOUNTER — Other Ambulatory Visit: Payer: Self-pay

## 2019-08-18 ENCOUNTER — Ambulatory Visit (INDEPENDENT_AMBULATORY_CARE_PROVIDER_SITE_OTHER): Payer: Medicare Other | Admitting: Family Medicine

## 2019-08-18 ENCOUNTER — Encounter: Payer: Self-pay | Admitting: Family Medicine

## 2019-08-18 VITALS — BP 172/79 | HR 85 | Temp 98.3°F | Ht 61.0 in | Wt 157.0 lb

## 2019-08-18 DIAGNOSIS — Z7901 Long term (current) use of anticoagulants: Secondary | ICD-10-CM

## 2019-08-18 DIAGNOSIS — I10 Essential (primary) hypertension: Secondary | ICD-10-CM

## 2019-08-18 DIAGNOSIS — F0150 Vascular dementia without behavioral disturbance: Secondary | ICD-10-CM | POA: Diagnosis not present

## 2019-08-18 DIAGNOSIS — I482 Chronic atrial fibrillation, unspecified: Secondary | ICD-10-CM

## 2019-08-18 LAB — HEMOGLOBIN, FINGERSTICK: Hemoglobin: 14.3 g/dL (ref 11.1–15.9)

## 2019-08-18 NOTE — Progress Notes (Signed)
Subjective: CC: afib, memory PCP: Kari Norlander, DO [id number] Kari [name] is a 84 y.o. female who is accompanied by her daughter today presenting to clinic today for:  1. Afib/ HTN Reports compliance with atenolol, captopril, Eliquis.  She denies any bleeding episodes.  Her amlodipine was actually recently discontinued by her cardiologist after her blood pressure was found to be hypotensive.  She has her blood pressures monitored at home daily by her husband.  She believes that most of them are well under 150 over 90s.  However, she did not bring a log in today.  No chest pain, shortness of breath, heart palpitations.  2. Memory loss She is accompanied today's visit by her daughter who notes that her memory has been stable since her last visit.  She is compliant with Aricept.  No mood disturbances.   ROS: Per HPI  Allergies  Allergen Reactions  . Erythromycin Other (See Comments)    Reaction unknown  . Penicillins Other (See Comments)    Reaction unknown  Has patient had a PCN reaction causing immediate rash, facial/tongue/throat swelling, SOB or lightheadedness with hypotension: unknown Has patient had a PCN reaction causing severe rash involving mucus membranes or skin necrosis: unknown Has patient had a PCN reaction that required hospitalization : unknown Has patient had a PCN reaction occurring within the last 10 years: no If all of the above answers are "NO", then may proceed with Cephalosporin use.    Past Medical History:  Diagnosis Date  . Adenomatous polyps   . AF (atrial fibrillation) ([place])   . Cataract   . Gallstones   . Hx of adenomatous colonic polyps   . Hyperlipidemia    x5 years  . Hypertension    x5 years  . Kyphosis   . Metabolic syndrome X   . Obesity   . Osteoporosis   . Stroke ([place]) 07/2017  . Subdural hematoma ([place])   . Symptomatic menopausal or female climacteric states   . Vitamin D deficiency     Current Outpatient Medications:  .   atenolol (TENORMIN) 50 MG tablet, TAKE 1 TABLET DAILY, Disp: 90 tablet, Rfl: 0 .  Benfotiamine 150 MG CAPS, Take 150 mg by mouth daily., Disp: , Rfl:  .  captopril (CAPOTEN) 50 MG tablet, TAKE (1) TABLET TWICE A DAY FOR HIGH BLOOD PRESSURE., Disp: 60 tablet, Rfl: 2 .  donepezil (ARICEPT) 10 MG tablet, Take 1 tablet (10 mg total) by mouth at bedtime., Disp: 90 tablet, Rfl: 0 .  ELIQUIS 5 MG TABS tablet, Take 1 tablet (5 mg total) by mouth 2 (two) times daily., Disp: 60 tablet, Rfl: 11 .  ezetimibe (ZETIA) 10 MG tablet, TAKE 1 TABLET ONCE DAILY FOR CHOLESTEROL, Disp: 90 tablet, Rfl: 0 .  ferrous sulfate 325 (65 FE) MG tablet, Take 325 mg by mouth daily., Disp: , Rfl:  .  potassium chloride (K-DUR) 10 MEQ tablet, TAKE 1 TABLET DAILY, Disp: 30 tablet, Rfl: 4 .  pravastatin (PRAVACHOL) 80 MG tablet, TAKE 1 TABLET ONCE DAILY FOR CHOLESTEROL, Disp: 90 tablet, Rfl: 1 .  Vitamin D, Ergocalciferol, (DRISDOL) 1.25 MG (50000 UT) CAPS capsule, TAKE 1 CAPSULE ONCE A WEEK, Disp: 12 capsule, Rfl: 0 Social History   Socioeconomic History  . Marital status: Married    Spouse name: Not on file  . Number of children: 2  . Years of education: Not on file  . Highest education level: Not on file  Occupational History  . Occupation: [employer]  Tobacco  Use  . Smoking status: Never Smoker  . Smokeless tobacco: Never Used  Substance and Sexual Activity  . Alcohol use: No  . Drug use: No  . Sexual activity: Not on file  Other Topics Concern  . Not on file  Social History Narrative   Lives with her husband and son, independent   1 son Surveyor, minerals)   1 Daughter Contractor)   2 grandchildren and 3 great-grandchild   12th grade education   Social Determinants of Health   Financial Resource Strain:   . Difficulty of Paying Living Expenses: Not on file  Food Insecurity:   . Worried About Charity fundraiser in the Last Year: Not on file  . Ran Out of Food in the Last Year: Not on file  Transportation  Needs:   . Lack of Transportation (Medical): Not on file  . Lack of Transportation (Non-Medical): Not on file  Physical Activity:   . Days of Exercise per Week: Not on file  . Minutes of Exercise per Session: Not on file  Stress:   . Feeling of Stress : Not on file  Social Connections:   . Frequency of Communication with Friends and Family: Not on file  . Frequency of Social Gatherings with Friends and Family: Not on file  . Attends Religious Services: Not on file  . Active Member of Clubs or Organizations: Not on file  . Attends Archivist Meetings: Not on file  . Marital Status: Not on file  Intimate Partner Violence:   . Fear of Current or Ex-Partner: Not on file  . Emotionally Abused: Not on file  . Physically Abused: Not on file  . Sexually Abused: Not on file   Family History  Problem Relation Age of Onset  . Heart attack Mother   . Stroke Mother   . Heart attack Father   . Alcohol abuse Father   . Hypertension Sister   . Stroke Sister   . Stroke Brother   . Hypertension Brother   . Heart attack Brother   . Hypertension Brother   . Depression Brother        suicide  . Hypertension Sister   . Heart attack Other   . Stroke Other   . Breast cancer Other   . Colon cancer Neg Hx     Objective: Office vital signs reviewed. BP (!) 172/79   Pulse 85   Temp 98.3 F (36.8 C) (Temporal)   Ht 5\' 1"  (1.549 m)   Wt 157 lb (71.2 kg)   SpO2 97%   BMI 29.66 kg/m   Physical Examination:  General: Awake, alert, well nourished, well groomed. No acute distress HEENT: Normal, sclera white, MMM Cardio: irregularly irregular w/ rate control. S1S2 heard, no murmurs appreciated Pulm: clear to auscultation bilaterally, no wheezes, rhonchi or rales; normal work of breathing on room air Extremities: warm, well perfused, No edema, cyanosis or clubbing; +2 pulses bilaterally MSK: uses cane for ambulation Psych: mood stable, speech normal.  Pleasant. Interactive Neuro:  AOx3  MMSE - Mini Mental State Exam 08/18/2019 05/18/2019 07/01/2018  Orientation to time 4 3 4   Orientation to Place 4 4 4   Registration 3 3 3   Attention/ Calculation 4 5 5   Recall 0 0 0  Language- name 2 objects 2 2 2   Language- repeat 0 1 1  Language- follow 3 step command 3 2 3   Language- read & follow direction 1 1 1   Write a sentence 1 1 1  Copy design 0 1 0  Total score 22 23 24     Assessment/ Plan: 85 y.o. female   1. Essential hypertension Not controlled on today's readings.  I do see where she was hypotensive during her cardiology visit 2 weeks ago though.  I do think that she likely has a component of whitecoat syndrome coming into the office.  I have asked that they monitor her blood pressures closely and contact me with these readings within the next 2 weeks.  May need to consider adjustment of medications pending these readings  2. Chronic atrial fibrillation (HCC) Rate controlled.  Hemoglobin within normal range.  No active bleeding - Fingerstick Hemoglobin  3. Chronic anticoagulation - Fingerstick Hemoglobin  4. Vascular dementia without behavioral disturbance (HCC) Stable.  Continue Aricept.  Discussed consideration for Namenda but declined this at this time.   No orders of the defined types were placed in this encounter.  No orders of the defined types were placed in this encounter.    Kari Norlander, DO [place] [phone]

## 2019-08-24 ENCOUNTER — Ambulatory Visit (INDEPENDENT_AMBULATORY_CARE_PROVIDER_SITE_OTHER): Payer: Medicare Other | Admitting: *Deleted

## 2019-08-24 VITALS — BP 98/65 | Wt 156.0 lb

## 2019-08-24 DIAGNOSIS — Z0000 Encounter for general adult medical examination without abnormal findings: Secondary | ICD-10-CM | POA: Diagnosis not present

## 2019-08-24 NOTE — Progress Notes (Signed)
MEDICARE ANNUAL WELLNESS VISIT  08/24/2019  Telephone Visit Disclaimer This Medicare AWV was conducted by telephone due to national recommendations for restrictions regarding the COVID-19 Pandemic (e.g. social distancing).  I verified, using two identifiers, that I am speaking with Kari [name] or their authorized healthcare agent. I discussed the limitations, risks, security, and privacy concerns of performing an evaluation and management service by telephone and the potential availability of an in-person appointment in the future. The patient expressed understanding and agreed to proceed.   Subjective:  Kari [name] is a 85 y.o. female patient of Kari Norlander, DO who had a Medicare Annual Wellness Visit today via telephone. Kari [name] is Retired and lives with their spouse and her son. she has 2 children. she reports that she is socially active and does interact with friends/family regularly. she is minimally physically active and enjoys walking.  Patient Care Team: Kari Norlander, DO as PCP - General (Family Medicine) Kari Breeding, MD as Consulting Physician (Cardiology) Kari Mayer, MD as Consulting Physician (Gastroenterology) Kari Bombard, MD as Consulting Physician (Neurology)  Advanced Directives 08/24/2019 11/10/2018 07/01/2018 03/03/2018 08/25/2017 02/27/2017 12/06/2016  Does Patient Have a Medical Advance Directive? Yes No No No No No No  Type of Advance Directive Living will - - - - - -  Does patient want to make changes to medical advance directive? No - Patient declined - - - - - -  Copy of Press photographer in [place]  Would patient like information on creating a medical advance directive? - No - Patient declined Yes (MAU/Ambulatory/Procedural Areas - Information given) No - Patient declined No - Patient declined No - Patient declined No - Patient declined  Pre-existing out of facility DNR order (yellow form or pink MOST form) -  - - - - - -    Hospital Utilization Over the Past 12 Months: # of hospitalizations or ER visits: 0 # of surgeries: 0  Review of Systems    Patient reports that her overall health is unchanged compared to last year.  General ROS: negative  Patient Reported Readings (BP, Pulse, CBG, Weight, etc) BP 98/65 Comment: last OV reading  Wt 156 lb (70.8 kg) Comment: last OV reading  BMI 29.48 kg/m    Pain Assessment       Current Medications & Allergies (verified) Allergies as of 08/24/2019      Reactions   Erythromycin Other (See Comments)   Reaction unknown   Penicillins Other (See Comments)   Reaction unknown  Has patient had a PCN reaction causing immediate rash, facial/tongue/throat swelling, SOB or lightheadedness with hypotension: unknown Has patient had a PCN reaction causing severe rash involving mucus membranes or skin necrosis: unknown Has patient had a PCN reaction that required hospitalization : unknown Has patient had a PCN reaction occurring within the last 10 years: no If all of the above answers are "NO", then may proceed with Cephalosporin use.      Medication List       Accurate as of August 24, 2019  1:36 PM. If you have any questions, ask your nurse or doctor.        atenolol 50 MG tablet Commonly known as: TENORMIN TAKE 1 TABLET DAILY   Benfotiamine 150 MG Caps Take 150 mg by mouth daily.   captopril 50 MG tablet Commonly known as: CAPOTEN TAKE (1) TABLET TWICE A DAY FOR HIGH BLOOD PRESSURE.  donepezil 10 MG tablet Commonly known as: Aricept Take 1 tablet (10 mg total) by mouth at bedtime.   Eliquis 5 MG Tabs tablet Generic drug: apixaban Take 1 tablet (5 mg total) by mouth 2 (two) times daily.   ezetimibe 10 MG tablet Commonly known as: ZETIA TAKE 1 TABLET ONCE DAILY FOR CHOLESTEROL   ferrous sulfate 325 (65 FE) MG tablet Take 325 mg by mouth daily.   potassium chloride 10 MEQ tablet Commonly known as: KLOR-CON TAKE 1 TABLET  DAILY   pravastatin 80 MG tablet Commonly known as: PRAVACHOL TAKE 1 TABLET ONCE DAILY FOR CHOLESTEROL   Vitamin D (Ergocalciferol) 1.25 MG (50000 UT) Caps capsule Commonly known as: DRISDOL TAKE 1 CAPSULE ONCE A WEEK       History (reviewed): Past Medical History:  Diagnosis Date  . Adenomatous polyps   . AF (atrial fibrillation) ([place])   . Cataract   . Gallstones   . Hx of adenomatous colonic polyps   . Hyperlipidemia    x5 years  . Hypertension    x5 years  . Kyphosis   . Metabolic syndrome X   . Obesity   . Osteoporosis   . Stroke ([place]) 07/2017  . Subdural hematoma ([place])   . Symptomatic menopausal or female climacteric states   . Vitamin D deficiency    Past Surgical History:  Procedure Laterality Date  . ABDOMINAL HYSTERECTOMY    . BLADDER SUSPENSION    . COLONOSCOPY  multiple  . ESOPHAGEAL DILATION N/A 03/03/2018   Procedure: ESOPHAGEAL DILATION;  Surgeon: Rogene Houston, MD;  Location: [facility];  Service: Endoscopy;  Laterality: N/A;  . ESOPHAGOGASTRODUODENOSCOPY N/A 03/03/2018   Procedure: ESOPHAGOGASTRODUODENOSCOPY (EGD);  Surgeon: Rogene Houston, MD;  Location: [facility];  Service: Endoscopy;  Laterality: N/A;  . EYE SURGERY Bilateral 09/2015  . VAGINAL HYSTERECTOMY     total   Family History  Problem Relation Age of Onset  . Heart attack Mother   . Stroke Mother   . Heart attack Father   . Alcohol abuse Father   . Hypertension Sister   . Stroke Sister   . Stroke Brother   . Hypertension Brother   . Heart attack Brother   . Hypertension Brother   . Depression Brother        suicide  . Hypertension Sister   . Heart attack Other   . Stroke Other   . Breast cancer Other   . Colon cancer Neg Hx    Social History   Socioeconomic History  . Marital status: Married    Spouse name: Kari [name]   . Number of children: 2  . Years of education: Not on file  . Highest education level: Not on file  Occupational History  . Occupation: Farmer    Tobacco Use  . Smoking status: Never Smoker  . Smokeless tobacco: Never Used  Substance and Sexual Activity  . Alcohol use: No  . Drug use: No  . Sexual activity: Not on file  Other Topics Concern  . Not on file  Social History Narrative   Lives with her husband and son, independent   1 son Surveyor, minerals)   1 Daughter Contractor)   2 grandchildren and 3 great-grandchild   12th grade education   Social Determinants of Health   Financial Resource Strain:   . Difficulty of Paying Living Expenses: Not on file  Food Insecurity:   . Worried About Charity fundraiser in the Last Year: Not  on file  . Ran Out of Food in the Last Year: Not on file  Transportation Needs:   . Lack of Transportation (Medical): Not on file  . Lack of Transportation (Non-Medical): Not on file  Physical Activity:   . Days of Exercise per Week: Not on file  . Minutes of Exercise per Session: Not on file  Stress:   . Feeling of Stress : Not on file  Social Connections:   . Frequency of Communication with Friends and Family: Not on file  . Frequency of Social Gatherings with Friends and Family: Not on file  . Attends Religious Services: Not on file  . Active Member of Clubs or Organizations: Not on file  . Attends Archivist Meetings: Not on file  . Marital Status: Not on file    Activities of Daily Living In your present state of health, do you have any difficulty performing the following activities: 08/24/2019  Hearing? N  Vision? Y  Comment rx glasses  Difficulty concentrating or making decisions? N  Walking or climbing stairs? N  Dressing or bathing? N  Doing errands, shopping? Y  Preparing Food and eating ? N  Using the Toilet? N  In the past six months, have you accidently leaked urine? N  Do you have problems with loss of bowel control? N  Managing your Medications? N  Managing your Finances? N  Some recent data might be hidden    Patient Education/ Literacy     Exercise Current Exercise Habits: Home exercise routine, Type of exercise: walking, Time (Minutes): 15, Frequency (Times/Week): 7, Weekly Exercise (Minutes/Week): 105, Intensity: Mild  Diet Patient reports consuming 3 meals a day and 0 snack(s) a day Patient reports that her primary diet is: Regular Patient reports that she does have regular access to food.   Depression Screen PHQ 2/9 Scores 08/24/2019 08/18/2019 05/18/2019 11/06/2018 08/18/2018 07/01/2018 04/14/2018  PHQ - 2 Score 0 0 0 0 0 0 0  PHQ- 9 Score - 0 - - - - -     Fall Risk Fall Risk  08/24/2019 08/18/2019 05/18/2019 11/06/2018 08/18/2018  Falls in the past year? 0 0 0 0 0  Number falls in past yr: - - - - -  Comment - - - - -  Injury with Fall? - - - - -  Comment - - - - -  Risk Factor Category  - - - - -  Risk for fall due to : - - - - -  Risk for fall due to: Comment - - - - -  Follow up - - - - -     Objective:  Kari [name] seemed alert and oriented and she participated appropriately during our telephone visit.  Blood Pressure Weight BMI  BP Readings from Last 3 Encounters:  08/24/19 98/65  08/18/19 (!) 172/79  08/05/19 98/65   Wt Readings from Last 3 Encounters:  08/24/19 156 lb (70.8 kg)  08/18/19 157 lb (71.2 kg)  08/05/19 156 lb (70.8 kg)   BMI Readings from Last 1 Encounters:  08/24/19 29.48 kg/m    *Unable to obtain current vital signs, weight, and BMI due to telephone visit type  Hearing/Vision  . August did not seem to have difficulty with hearing/understanding during the telephone conversation . Reports that she has had a formal eye exam by an eye care professional within the past year . Reports that she has not had a formal hearing evaluation within the past year *Unable to  fully assess hearing and vision during telephone visit type  Cognitive Function: 6CIT Screen 08/24/2019  What Year? 0 points  What month? 0 points  What time? 0 points  Count back from 20 0 points  Months in  reverse 2 points  Repeat phrase 4 points  Total Score 6   (Normal:0-7, Significant for Dysfunction: >8)  Normal Cognitive Function Screening: Yes   Immunization & Health Maintenance Record Immunization History  Administered Date(s) Administered  . Influenza Inj Mdck Quad Pf 05/18/2019  . Influenza Split 06/18/2015  . Influenza Whole 05/04/2010  . Influenza, High Dose Seasonal PF 05/25/2016, 06/03/2017, 07/01/2018  . Influenza,inj,Quad PF,6+ Mos 05/28/2013, 06/21/2014, 06/08/2015  . Influenza-Unspecified 06/03/2017  . Pneumococcal Conjugate-13 01/06/2014  . Pneumococcal Polysaccharide-23 10/05/1999, 02/02/2015  . Td 10/05/2007  . Tdap 05/05/2011    Health Maintenance  Topic Date Due  . DEXA SCAN  04/04/2019  . MAMMOGRAM  10/02/2019 (Originally 05/04/2014)  . TETANUS/TDAP  05/04/2021  . INFLUENZA VACCINE  Completed  . PNA vac Low Risk Adult  Completed       Assessment  This is a routine wellness examination for Kari [name].  Health Maintenance: Due or Overdue Health Maintenance Due  Topic Date Due  . DEXA SCAN  04/04/2019    Kari [name] does not need a referral for Community Assistance: Care Management:   no Social Work:    no Prescription Assistance:  no Nutrition/Diabetes Education:  no   Plan:  Personalized Goals Goals Addressed            This Visit's Progress   . DIET - INCREASE WATER INTAKE   On track    Drink at least 48 -64 oz of water per day.       Personalized Health Maintenance & Screening Recommendations  dexa is due - will do at next OV   Lung Cancer Screening Recommended: no (Low Dose CT Chest recommended if Age 53-80 years, 30 pack-year currently smoking OR have quit w/in past 15 years) Hepatitis C Screening recommended: no HIV Screening recommended: no  Advanced Directives: Written information was not prepared per patient's request.  Referrals & Orders No orders of the defined types were placed in this  encounter.   Follow-up Plan . Follow-up with Kari Norlander, DO as planned    I have personally reviewed and noted the following in the patient's chart:   . Medical and social history . Use of alcohol, tobacco or illicit drugs  . Current medications and supplements . Functional ability and status . Nutritional status . Physical activity . Advanced directives . List of other physicians . Hospitalizations, surgeries, and ER visits in previous 12 months . Vitals . Screenings to include cognitive, depression, and falls . Referrals and appointments  In addition, I have reviewed and discussed with Kari [name] certain preventive protocols, quality metrics, and best practice recommendations. A written personalized care plan for preventive services as well as general preventive health recommendations is available and can be mailed to the patient at her request.      [place] Dec  08/24/2019

## 2019-08-24 NOTE — Patient Instructions (Signed)
  Kari [name] , Thank you for taking time to come for your Medicare Wellness Visit. I appreciate your ongoing commitment to your health goals. Please review the following plan we discussed and let me know if I can assist you in the future.   These are the goals we discussed: Goals    . DIET - INCREASE WATER INTAKE     Drink at least 48 -64 oz of water per day.        This is a list of the screening recommended for you and due dates:  Health Maintenance  Topic Date Due  . DEXA scan (bone density measurement)  04/04/2019  . Mammogram  10/02/2019*  . Tetanus Vaccine  05/04/2021  . Flu Shot  Completed  . Pneumonia vaccines  Completed  *Topic was postponed. The date shown is not the original due date.

## 2019-08-27 ENCOUNTER — Other Ambulatory Visit: Payer: Self-pay | Admitting: Family Medicine

## 2019-08-27 DIAGNOSIS — F0150 Vascular dementia without behavioral disturbance: Secondary | ICD-10-CM

## 2019-09-08 ENCOUNTER — Other Ambulatory Visit: Payer: Self-pay | Admitting: *Deleted

## 2019-09-08 MED ORDER — POTASSIUM CHLORIDE ER 10 MEQ PO TBCR: 10.0000 meq | EXTENDED_RELEASE_TABLET | Freq: Every day | ORAL | 4 refills | Status: DC

## 2019-09-21 ENCOUNTER — Ambulatory Visit: Payer: Medicare Other | Admitting: Family Medicine

## 2019-09-22 ENCOUNTER — Other Ambulatory Visit: Payer: Self-pay | Admitting: *Deleted

## 2019-09-22 MED ORDER — POTASSIUM CHLORIDE ER 10 MEQ PO TBCR: 10.0000 meq | EXTENDED_RELEASE_TABLET | Freq: Every day | ORAL | 1 refills | Status: DC

## 2019-09-25 ENCOUNTER — Other Ambulatory Visit: Payer: Self-pay | Admitting: Physician Assistant

## 2019-10-12 ENCOUNTER — Other Ambulatory Visit: Payer: Self-pay | Admitting: Family Medicine

## 2019-10-15 ENCOUNTER — Other Ambulatory Visit: Payer: Self-pay | Admitting: Family Medicine

## 2019-10-21 ENCOUNTER — Other Ambulatory Visit: Payer: Self-pay

## 2019-10-21 MED ORDER — PRAVASTATIN SODIUM 80 MG PO TABS: ORAL_TABLET | ORAL | 0 refills | Status: DC

## 2019-11-12 ENCOUNTER — Other Ambulatory Visit: Payer: Self-pay | Admitting: Family Medicine

## 2019-11-12 DIAGNOSIS — F0150 Vascular dementia without behavioral disturbance: Secondary | ICD-10-CM

## 2019-11-19 ENCOUNTER — Telehealth: Payer: Self-pay | Admitting: Family Medicine

## 2019-11-19 NOTE — Telephone Encounter (Signed)
ppw on providers desk 

## 2019-11-23 ENCOUNTER — Other Ambulatory Visit: Payer: Self-pay | Admitting: *Deleted

## 2019-11-23 MED ORDER — APIXABAN 5 MG PO TABS: ORAL_TABLET | ORAL | 0 refills | Status: DC

## 2019-11-24 DIAGNOSIS — B351 Tinea unguium: Secondary | ICD-10-CM | POA: Diagnosis not present

## 2019-11-24 DIAGNOSIS — L84 Corns and callosities: Secondary | ICD-10-CM | POA: Diagnosis not present

## 2019-11-24 DIAGNOSIS — I70203 Unspecified atherosclerosis of native arteries of extremities, bilateral legs: Secondary | ICD-10-CM | POA: Diagnosis not present

## 2019-11-24 DIAGNOSIS — M79676 Pain in unspecified toe(s): Secondary | ICD-10-CM | POA: Diagnosis not present

## 2019-11-25 NOTE — Telephone Encounter (Signed)
Aware ppw was faxed on 11/20/19

## 2019-11-29 DIAGNOSIS — R410 Disorientation, unspecified: Secondary | ICD-10-CM | POA: Diagnosis not present

## 2019-11-29 DIAGNOSIS — R8290 Unspecified abnormal findings in urine: Secondary | ICD-10-CM | POA: Diagnosis not present

## 2019-11-29 DIAGNOSIS — R350 Frequency of micturition: Secondary | ICD-10-CM | POA: Diagnosis not present

## 2019-11-30 ENCOUNTER — Telehealth: Payer: Self-pay

## 2019-11-30 NOTE — Telephone Encounter (Signed)
Received patient's Eliquis medication in mail today.  Called patient and left message for her to call back to let her know that medication is here - 3 bottles, 60 tablets each bottle.  Medication is in triage.

## 2019-12-03 ENCOUNTER — Other Ambulatory Visit: Payer: Self-pay

## 2019-12-03 ENCOUNTER — Ambulatory Visit (INDEPENDENT_AMBULATORY_CARE_PROVIDER_SITE_OTHER): Payer: Medicare Other | Admitting: Nurse Practitioner

## 2019-12-03 ENCOUNTER — Encounter: Payer: Self-pay | Admitting: Nurse Practitioner

## 2019-12-03 VITALS — BP 166/81 | HR 94 | Temp 97.3°F | Resp 20 | Ht 61.0 in | Wt 152.0 lb

## 2019-12-03 DIAGNOSIS — R2689 Other abnormalities of gait and mobility: Secondary | ICD-10-CM

## 2019-12-03 DIAGNOSIS — R410 Disorientation, unspecified: Secondary | ICD-10-CM

## 2019-12-03 NOTE — Addendum Note (Signed)
Addended by: Chevis Pretty on: 12/03/2019 11:00 AM   Modules accepted: Orders

## 2019-12-03 NOTE — Patient Instructions (Signed)
Confusion °Confusion is the inability to think with the usual speed or clarity. People who are confused often describe their thinking as cloudy or unclear. Confusion can also include feeling disoriented. This means you are unaware of where you are or who you are. You may also not know the date or time. When confused, you may have difficulty remembering, paying attention, or making decisions. Some people also act aggressively when they are confused. °In some cases, confusion may come on quickly. In other cases, it may develop slowly over time. How quickly confusion comes on depends on the cause. °Confusion may be caused by: °· Head injury (concussion). °· Seizures. °· Stroke. °· Fever. °· Brain tumor. °· Decrease in brain function due to a vascular or neurologic condition (dementia). °· Emotions, like rage or terror. °· Inability to know what is real and what is not (hallucinations). °· Infections, such as a urinary tract infection (UTI). °· Using too much alcohol, drugs, or medicines. °· Loss of fluid (dehydration) or an imbalance of salts in the body (electrolytes). °· Lack of sleep. °· Low blood sugar (diabetes). °· Low levels of oxygen. This comes from conditions such as chronic lung disorders. °· Side effects of medicines, or taking medicines that affect other medicines (drug interactions). °· Lack of certain nutrients, especially niacin, thiamine, vitamin C, or vitamin B. °· Sudden drop in body temperature (hypothermia). °· Change in routine, such as traveling or being hospitalized. °Follow these instructions at home: °Pay attention to your symptoms. Tell your health care provider about any changes or if you develop new symptoms. Follow these instructions to control or treat symptoms. Ask a family member or friend for help if needed. °Medicines °· Take over-the-counter and prescription medicines only as told by your health care provider. °· Ask your health care provider about changing or stopping any medicines  that may be causing your confusion. °· Avoid pain medicines or sleep medicines until you have fully recovered. °· Use a pillbox or an alarm to help you take the right medicines at the right time. °Lifestyle ° °· Eat a balanced diet that includes fruits and vegetables. °· Get enough sleep. For most adults, this is 7-9 hours each night. °· Do not drink alcohol. °· Do not become isolated. Spend time with other people and make plans for your days. °· Do not drive until your health care provider says that it is safe to do so. °· Do not use any products that contain nicotine or tobacco, such as cigarettes and e-cigarettes. If you need help quitting, ask your health care provider. °· Stop other activities that may increase your chances of getting hurt. These may include some work duties, sports activities, swimming, or bike riding. Ask your health care provider what activities are safe for you. °What caregivers can do °· Find out if the person is confused. Ask the person to state his or her name, age, and the date. If the person is unsure or answers incorrectly, he or she may be confused. °· Always introduce yourself, no matter how well the person knows you. °· Remind the person of his or her location. Do this often. °· Place a calendar and clock near the person who is confused. °· Talk about current events and plans for the day. °· Keep the environment calm, quiet, and peaceful. °· Help the person do the things that he or she is unable to do. These include: °? Taking medicines. °? Keeping follow-up visits with his or her health care   provider. °? Helping with household duties, including meal preparation. °? Running errands. °· Get help if you need it. There are several support groups for caregivers. °· If the person you are helping needs more support, consider day care, extended care programs, or a skilled nursing facility. The person's health care provider may be able to help evaluate these options. °General  instructions °· Monitor yourself for any conditions you may have. These may include: °? Checking your blood glucose levels, if you have diabetes. °? Watching your weight, if you are overweight. °? Monitoring your blood pressure, if you have hypertension. °? Monitoring your body temperature, if you have a fever. °· Keep all follow-up visits as told by your health care provider. This is important. °Contact a health care provider if: °· Your symptoms get worse. °Get help right away if you: °· Feel that you are not able to care for yourself. °· Develop severe headaches, repeated vomiting, seizures, blackouts, or slurred speech. °· Have increasing confusion, weakness, numbness, restlessness, or personality changes. °· Develop a loss of balance, have marked dizziness, feel uncoordinated, or fall. °· Develop severe anxiety, or you have delusions or hallucinations. °These symptoms may represent a serious problem that is an emergency. Do not wait to see if the symptoms will go away. Get medical help right away. Call your local emergency services (911 in the [place] Do not drive yourself to the hospital. °Summary °· Confusion is the inability to think with the usual speed or clarity. People who are confused often describe their thinking as cloudy or unclear. °· Confusion can also include having difficulty remembering, paying attention, or making decisions. °· Confusion may come on quickly or develop slowly over time, depending on the cause. There are many different causes of confusion. °· Ask for help from family members or friends if you are unable to take care of yourself. °This information is not intended to replace advice given to you by your health care provider. Make sure you discuss any questions you have with your health care provider. °Document Revised: 08/22/2017 Document Reviewed: 08/22/2017 °Elsevier Patient Education © 2020 [employer] ° °

## 2019-12-03 NOTE — Progress Notes (Addendum)
Subjective:    Patient ID: Kari [name], female    DOB: 10/04/1933, 86 y.o.   MRN: [id number]  Chief Complaint: Altered Mental Status (Seen at Urgent Care and some better)   HPI Patient os brought in today by her son and daughter. They said early Sunday morning sh ewas getting up several time sto go to the restroom. She was dragging all day Sunday and her urine had mild odor. They took er to urgent care and UA was negative, but they went ahead and treated her anyway. She was confused Monday and was seeing things and was not functioning well. She has gotten better, no confusion or hallucinations. She has since stooped getting up in the middle of night to void. Urine culture did not grow anything. She has gotten better.   * she has also had issues with balance- thinks it is from where she has not been getting out of house due to covid  Review of Systems  Constitutional: Negative for diaphoresis.  Eyes: Positive for visual disturbance (on sunday and monday). Negative for pain.  Respiratory: Negative for shortness of breath.   Cardiovascular: Negative for chest pain, palpitations and leg swelling.  Gastrointestinal: Negative for abdominal pain.  Endocrine: Negative for polydipsia.  Skin: Negative for rash.  Neurological: Negative for dizziness, weakness and headaches.       No neruo deficits noted- no weakness  Hematological: Does not bruise/bleed easily.  All other systems reviewed and are negative.      Objective:   Physical Exam Vitals and nursing note reviewed.  Constitutional:      General: She is not in acute distress.    Appearance: Normal appearance. She is well-developed.  HENT:     Head: Normocephalic.     Nose: Nose normal.  Eyes:     Pupils: Pupils are equal, round, and reactive to light.  Neck:     Vascular: No carotid bruit or JVD.  Cardiovascular:     Rate and Rhythm: Normal rate and regular rhythm.     Heart sounds: Normal heart sounds.  Pulmonary:   Effort: Pulmonary effort is normal. No respiratory distress.     Breath sounds: Normal breath sounds. No wheezing or rales.  Chest:     Chest wall: No tenderness.  Abdominal:     General: Bowel sounds are normal. There is no distension or abdominal bruit.     Palpations: Abdomen is soft. There is no hepatomegaly, splenomegaly, mass or pulsatile mass.     Tenderness: There is no abdominal tenderness.  Musculoskeletal:        General: Normal range of motion.     Cervical back: Normal range of motion and neck supple.     Comments: Walk slow and stady with cane  Lymphadenopathy:     Cervical: No cervical adenopathy.  Skin:    General: Skin is warm and dry.  Neurological:     Mental Status: She is alert and oriented to person, place, and time.     Cranial Nerves: No cranial nerve deficit.     Sensory: No sensory deficit.     Deep Tendon Reflexes: Reflexes are normal and symmetric.  Psychiatric:        Behavior: Behavior normal.        Thought Content: Thought content normal.        Judgment: Judgment normal.     BP (!) 166/81   Pulse 94   Temp (!) 97.3 F (36.3 C) (Temporal)  Resp 20   Ht 5\' 1"  (1.549 m)   Wt 152 lb (68.9 kg)   SpO2 97%   BMI 28.72 kg/m        Assessment & Plan:  Kari [name] in today with chief complaint of Altered Mental Status (Seen at Urgent Care and some better)   1. Confusion Watch Still may have been from UTI since antibiotic helped Increase fluids Orient daily If reoccurs go to ER  2. Balance problem Physical therapy  The above assessment and management plan was discussed with the patient. The patient verbalized understanding of and has agreed to the management plan. Patient is aware to call the clinic if symptoms persist or worsen. Patient is aware when to return to the clinic for a follow-up visit. Patient educated on when it is appropriate to go to the emergency department.   Mary-Margaret Hassell Done, FNP

## 2019-12-15 DIAGNOSIS — I10 Essential (primary) hypertension: Secondary | ICD-10-CM | POA: Diagnosis not present

## 2019-12-18 ENCOUNTER — Other Ambulatory Visit: Payer: Self-pay | Admitting: *Deleted

## 2019-12-18 ENCOUNTER — Other Ambulatory Visit: Payer: Self-pay | Admitting: Family Medicine

## 2019-12-18 ENCOUNTER — Other Ambulatory Visit: Payer: Self-pay

## 2019-12-18 ENCOUNTER — Ambulatory Visit (INDEPENDENT_AMBULATORY_CARE_PROVIDER_SITE_OTHER): Payer: Medicare Other

## 2019-12-18 DIAGNOSIS — H269 Unspecified cataract: Secondary | ICD-10-CM | POA: Diagnosis not present

## 2019-12-18 DIAGNOSIS — E669 Obesity, unspecified: Secondary | ICD-10-CM | POA: Diagnosis not present

## 2019-12-18 DIAGNOSIS — Z9181 History of falling: Secondary | ICD-10-CM

## 2019-12-18 DIAGNOSIS — Z86010 Personal history of colonic polyps: Secondary | ICD-10-CM

## 2019-12-18 DIAGNOSIS — E785 Hyperlipidemia, unspecified: Secondary | ICD-10-CM | POA: Diagnosis not present

## 2019-12-18 DIAGNOSIS — N390 Urinary tract infection, site not specified: Secondary | ICD-10-CM | POA: Diagnosis not present

## 2019-12-18 DIAGNOSIS — M40209 Unspecified kyphosis, site unspecified: Secondary | ICD-10-CM

## 2019-12-18 DIAGNOSIS — E8881 Metabolic syndrome: Secondary | ICD-10-CM

## 2019-12-18 DIAGNOSIS — Z7901 Long term (current) use of anticoagulants: Secondary | ICD-10-CM | POA: Diagnosis not present

## 2019-12-18 DIAGNOSIS — F0150 Vascular dementia without behavioral disturbance: Secondary | ICD-10-CM | POA: Diagnosis not present

## 2019-12-18 DIAGNOSIS — I482 Chronic atrial fibrillation, unspecified: Secondary | ICD-10-CM | POA: Diagnosis not present

## 2019-12-18 DIAGNOSIS — Z6841 Body Mass Index (BMI) 40.0 and over, adult: Secondary | ICD-10-CM | POA: Diagnosis not present

## 2019-12-18 DIAGNOSIS — Z8673 Personal history of transient ischemic attack (TIA), and cerebral infarction without residual deficits: Secondary | ICD-10-CM

## 2019-12-18 DIAGNOSIS — M810 Age-related osteoporosis without current pathological fracture: Secondary | ICD-10-CM

## 2019-12-18 DIAGNOSIS — I10 Essential (primary) hypertension: Secondary | ICD-10-CM

## 2019-12-18 MED ORDER — ATENOLOL 50 MG PO TABS: 50.0000 mg | ORAL_TABLET | Freq: Every day | ORAL | 0 refills | Status: DC

## 2019-12-21 ENCOUNTER — Other Ambulatory Visit: Payer: Self-pay

## 2019-12-21 ENCOUNTER — Other Ambulatory Visit: Payer: Self-pay | Admitting: Family Medicine

## 2019-12-21 ENCOUNTER — Encounter: Payer: Self-pay | Admitting: Family Medicine

## 2019-12-21 ENCOUNTER — Ambulatory Visit (INDEPENDENT_AMBULATORY_CARE_PROVIDER_SITE_OTHER): Payer: Medicare Other | Admitting: Family Medicine

## 2019-12-21 VITALS — BP 124/92 | HR 79 | Temp 97.5°F | Ht 61.0 in | Wt 154.0 lb

## 2019-12-21 DIAGNOSIS — F0150 Vascular dementia without behavioral disturbance: Secondary | ICD-10-CM | POA: Diagnosis not present

## 2019-12-21 DIAGNOSIS — I10 Essential (primary) hypertension: Secondary | ICD-10-CM | POA: Diagnosis not present

## 2019-12-21 DIAGNOSIS — R8290 Unspecified abnormal findings in urine: Secondary | ICD-10-CM | POA: Diagnosis not present

## 2019-12-21 DIAGNOSIS — I482 Chronic atrial fibrillation, unspecified: Secondary | ICD-10-CM

## 2019-12-21 DIAGNOSIS — I700 Atherosclerosis of aorta: Secondary | ICD-10-CM | POA: Diagnosis not present

## 2019-12-21 LAB — URINALYSIS, COMPLETE
Bilirubin, UA: NEGATIVE
Glucose, UA: NEGATIVE
Nitrite, UA: NEGATIVE
Specific Gravity, UA: 1.025 (ref 1.005–1.030)
Urobilinogen, Ur: 2 mg/dL — ABNORMAL HIGH (ref 0.2–1.0)
pH, UA: 6.5 (ref 5.0–7.5)

## 2019-12-21 LAB — MICROSCOPIC EXAMINATION
Epithelial Cells (non renal): >10 /hpf — AB (ref 0–10)
Renal Epithel, UA: NONE SEEN /hpf

## 2019-12-21 MED ORDER — APIXABAN 5 MG PO TABS: ORAL_TABLET | ORAL | 5 refills | Status: DC

## 2019-12-21 MED ORDER — CAPTOPRIL 50 MG PO TABS: 50.0000 mg | ORAL_TABLET | Freq: Two times a day (BID) | ORAL | 3 refills | Status: DC

## 2019-12-21 MED ORDER — FLUCONAZOLE 150 MG PO TABS: 150.0000 mg | ORAL_TABLET | Freq: Once | ORAL | 0 refills | Status: AC

## 2019-12-21 MED ORDER — PRAVASTATIN SODIUM 80 MG PO TABS: ORAL_TABLET | ORAL | 3 refills | Status: DC

## 2019-12-21 MED ORDER — ATENOLOL 50 MG PO TABS: 50.0000 mg | ORAL_TABLET | Freq: Every day | ORAL | 3 refills | Status: DC

## 2019-12-21 MED ORDER — DONEPEZIL HCL 10 MG PO TABS: 10.0000 mg | ORAL_TABLET | Freq: Every day | ORAL | 3 refills | Status: DC

## 2019-12-21 MED ORDER — EZETIMIBE 10 MG PO TABS: ORAL_TABLET | ORAL | 3 refills | Status: DC

## 2019-12-21 NOTE — Progress Notes (Signed)
Subjective: CC: follow up afib, memory PCP: [name], DO [id number] Kari [name] is a 86 y.o. female who is accompanied by her daughter today presenting to clinic today for:  1. Afib/ HTN Reports compliance with atenolol, captopril, Eliquis.   No hematochezia, melena, chest pain, shortness of breath.  2. Memory loss Patient is compliant with Aricept.  Her daughter is present during today's visit and notes that memory has been stable.  She does not note any mood disturbances.  3. Urine odor Slightly better after antibiotics but has returned.  She was seen at the urgent care and had a urine culture which was negative.  She was however empirically treated with antibiotics.  She would like to get a repeat urinalysis.  Denies any dysuria, hematuria, increased urinary frequency.  She does admit to not drinking much water  ROS: Per HPI  Allergies  Allergen Reactions  . Erythromycin Other (See Comments)    Reaction unknown  . Penicillins Other (See Comments)    Reaction unknown  Has patient had a PCN reaction causing immediate rash, facial/tongue/throat swelling, SOB or lightheadedness with hypotension: unknown Has patient had a PCN reaction causing severe rash involving mucus membranes or skin necrosis: unknown Has patient had a PCN reaction that required hospitalization : unknown Has patient had a PCN reaction occurring within the last 10 years: no If all of the above answers are "NO", then may proceed with Cephalosporin use.    Past Medical History:  Diagnosis Date  . Adenomatous polyps   . AF (atrial fibrillation) ([place])   . Cataract   . Gallstones   . Hx of adenomatous colonic polyps   . Hyperlipidemia    x5 years  . Hypertension    x5 years  . Kyphosis   . Metabolic syndrome X   . Obesity   . Osteoporosis   . Stroke ([place]) 07/2017  . Subdural hematoma ([place])   . Symptomatic menopausal or female climacteric states   . Vitamin D deficiency     Current Outpatient  Medications:  .  apixaban (ELIQUIS) 5 MG TABS tablet, Take 1 tablet (5 mg total) by mouth 2 (two) times daily., Disp: 60 tablet, Rfl: 0 .  atenolol (TENORMIN) 50 MG tablet, Take 1 tablet (50 mg total) by mouth daily., Disp: 90 tablet, Rfl: 0 .  Benfotiamine 150 MG CAPS, Take 150 mg by mouth daily., Disp: , Rfl:  .  captopril (CAPOTEN) 50 MG tablet, TAKE 1 TABLET BY MOUTH 2 TIMES DAILY FOR HIGH BLOOD PRESSURE., Disp: 60 tablet, Rfl: 0 .  donepezil (ARICEPT) 10 MG tablet, TAKE ONE TABLET AT BEDTIME, Disp: 90 tablet, Rfl: 0 .  ezetimibe (ZETIA) 10 MG tablet, TAKE 1 TABLET ONCE DAILY FOR CHOLESTEROL, Disp: 90 tablet, Rfl: 0 .  ferrous sulfate 325 (65 FE) MG tablet, Take 325 mg by mouth daily., Disp: , Rfl:  .  potassium chloride (KLOR-CON) 10 MEQ tablet, Take 1 tablet (10 mEq total) by mouth daily., Disp: 90 tablet, Rfl: 1 .  pravastatin (PRAVACHOL) 80 MG tablet, TAKE 1 TABLET ONCE DAILY FOR CHOLESTEROL, Disp: 90 tablet, Rfl: 0 .  Vitamin D, Ergocalciferol, (DRISDOL) 1.25 MG (50000 UNIT) CAPS capsule, TAKE 1 CAPSULE ONCE A WEEK, Disp: 12 capsule, Rfl: 0 Social History   Socioeconomic History  . Marital status: Married    Spouse name: Ollen Gross   . Number of children: 2  . Years of education: Not on file  . Highest education level: Not on file  Occupational  History  . Occupation: Farmer  Tobacco Use  . Smoking status: Never Smoker  . Smokeless tobacco: Never Used  Substance and Sexual Activity  . Alcohol use: No  . Drug use: No  . Sexual activity: Not on file  Other Topics Concern  . Not on file  Social History Narrative   Lives with her husband and son, independent   1 son Surveyor, minerals)   1 Daughter Contractor)   2 grandchildren and 3 great-grandchild   12th grade education   Social Determinants of Health   Financial Resource Strain:   . Difficulty of Paying Living Expenses:   Food Insecurity:   . Worried About Charity fundraiser in the Last Year:   . Arboriculturist in the  Last Year:   Transportation Needs:   . Film/video editor (Medical):   Marland Kitchen Lack of Transportation (Non-Medical):   Physical Activity:   . Days of Exercise per Week:   . Minutes of Exercise per Session:   Stress:   . Feeling of Stress :   Social Connections:   . Frequency of Communication with Friends and Family:   . Frequency of Social Gatherings with Friends and Family:   . Attends Religious Services:   . Active Member of Clubs or Organizations:   . Attends Archivist Meetings:   Marland Kitchen Marital Status:   Intimate Partner Violence:   . Fear of Current or Ex-Partner:   . Emotionally Abused:   Marland Kitchen Physically Abused:   . Sexually Abused:    Family History  Problem Relation Age of Onset  . Heart attack Mother   . Stroke Mother   . Heart attack Father   . Alcohol abuse Father   . Hypertension Sister   . Stroke Sister   . Stroke Brother   . Hypertension Brother   . Heart attack Brother   . Hypertension Brother   . Depression Brother        suicide  . Hypertension Sister   . Heart attack Other   . Stroke Other   . Breast cancer Other   . Colon cancer Neg Hx     Objective: Office vital signs reviewed. BP (!) 124/92   Pulse 79   Temp (!) 97.5 F (36.4 C)   Ht 5\' 1"  (1.549 m)   Wt 154 lb (69.9 kg)   BMI 29.10 kg/m   Physical Examination:  General: Awake, alert, well nourished, well groomed. Tired appearing today. No acute distress HEENT: Normal, sclera white, MMM Cardio: irregularly irregular w/ rate control. S1S2 heard, no murmurs appreciated Pulm: clear to auscultation bilaterally, no wheezes, rhonchi or rales; normal work of breathing on room air Extremities: warm, well perfused, No edema, cyanosis or clubbing; +2 pulses bilaterally MSK: uses cane for ambulation Psych: mood stable, speech normal.  Pleasant. Interactive   Assessment/ Plan: 86 y.o. female   1. Essential hypertension Diastolic is slightly elevated but again she really has a labile blood  pressure where she is uncontrolled and at sometimes borderline hypotensive.  No changes to medications today.  2. Chronic atrial fibrillation (HCC) Rate controlled  3. Thoracic aortic atherosclerosis (HCC) Continue current medication regimen  4. Vascular dementia without behavioral disturbance (HCC) - donepezil (ARICEPT) 10 MG tablet; Take 1 tablet (10 mg total) by mouth at bedtime.  Dispense: 90 tablet; Refill: 3  5. Abnormal urine odor Appears to be dehydrated urine.  I will send for urine culture.  I have encouraged p.o. hydration. -  Urinalysis, Complete - Urine Culture    Orders Placed This Encounter  Procedures  . Urinalysis, Complete   No orders of the defined types were placed in this encounter.    [name], DO [place] [phone]

## 2019-12-21 NOTE — Patient Instructions (Signed)
Meds refilled.  Urine is concentrated.  I'm going to send for culture to make sure no bacteria grow.  Nothing to suggest infection though.  Increase fluid consumption.

## 2019-12-24 LAB — URINE CULTURE

## 2020-01-14 DIAGNOSIS — I10 Essential (primary) hypertension: Secondary | ICD-10-CM | POA: Diagnosis not present

## 2020-01-29 ENCOUNTER — Other Ambulatory Visit: Payer: Self-pay

## 2020-01-29 MED ORDER — ERGOCALCIFEROL 1.25 MG (50000 UT) PO CAPS: ORAL_CAPSULE | ORAL | 1 refills | Status: DC

## 2020-04-08 ENCOUNTER — Other Ambulatory Visit: Payer: Self-pay | Admitting: Family Medicine

## 2020-04-18 ENCOUNTER — Ambulatory Visit: Payer: Medicare Other | Admitting: Family Medicine

## 2020-06-03 ENCOUNTER — Encounter: Payer: Self-pay | Admitting: Nurse Practitioner

## 2020-06-03 ENCOUNTER — Ambulatory Visit (INDEPENDENT_AMBULATORY_CARE_PROVIDER_SITE_OTHER): Payer: Medicare Other | Admitting: Nurse Practitioner

## 2020-06-03 DIAGNOSIS — R059 Cough, unspecified: Secondary | ICD-10-CM | POA: Insufficient documentation

## 2020-06-03 MED ORDER — DELSYM COUGH/CHEST CONGEST DM 5-100 MG/5ML PO LIQD: ORAL | 0 refills | Status: AC

## 2020-06-03 NOTE — Progress Notes (Signed)
   Virtual Visit via telephone Note Due to COVID-19 pandemic this visit was conducted virtually. This visit type was conducted due to national recommendations for restrictions regarding the COVID-19 Pandemic (e.g. social distancing, sheltering in place) in an effort to limit this patient's exposure and mitigate transmission in our community. All issues noted in this document were discussed and addressed.  A physical exam was not performed with this format.  I connected with Kari [name] on 06/03/20 at 10.53 am by telephone and verified that I am speaking with the correct person using two identifiers. Kari [name] is currently located at home and son Kari [name] is currently with patient during visit. The provider, Ivy Lynn, NP is located in their office at time of visit.  I discussed the limitations, risks, security and privacy concerns of performing an evaluation and management service by telephone and the availability of in person appointments. I also discussed with the patient that there may be a patient responsible charge related to this service. The patient expressed understanding and agreed to proceed.   History and Present Illness:  Cough This is a new problem. The current episode started in the past 7 days. The problem has been gradually worsening. The problem occurs constantly. The cough is productive of sputum. Associated symptoms include shortness of breath and wheezing. Pertinent negatives include no chest pain, chills, ear congestion, ear pain or hemoptysis. Associated symptoms comments: Sneezing . Nothing aggravates the symptoms. She has tried nothing for the symptoms.      Review of Systems  Constitutional: Negative for chills.  HENT: Negative for ear pain.   Respiratory: Positive for cough, shortness of breath and wheezing. Negative for hemoptysis.   Cardiovascular: Negative for chest pain.  All other systems reviewed and are negative.    Observations/Objective: Tele  virtual visit  Assessment and Plan: Cough Patient is a 86 year old female who presents via virtual telephone visit for cough. Symptoms have been ongoing for the last 3 days. Symptoms are worse. Patient has a wet cough with clear sputum. Patient is reporting increased wheezing, shortness of breath and stuffiness. Advised patient to increase hydration, Tylenol for headache. Humidifier, Delsym over the counter.  COVID-19 swab ordered  Follow up with unresolved or worsening symptoms   Follow Up Instructions: follow up with worsening or unresolved symptoms    I discussed the assessment and treatment plan with the patient. The patient was provided an opportunity to ask questions and all were answered. The patient agreed with the plan and demonstrated an understanding of the instructions.   The patient was advised to call back or seek an in-person evaluation if the symptoms worsen or if the condition fails to improve as anticipated.  The above assessment and management plan was discussed with the patient. The patient verbalized understanding of and has agreed to the management plan. Patient is aware to call the clinic if symptoms persist or worsen. Patient is aware when to return to the clinic for a follow-up visit. Patient educated on when it is appropriate to go to the emergency department.   Time call ended:  11.04 AM  I provided 11 minutes of non-face-to-face time during this encounter.    Ivy Lynn, NP

## 2020-06-03 NOTE — Assessment & Plan Note (Signed)
Patient is a 86 year old female who presents via virtual telephone visit for cough. Symptoms have been ongoing for the last 3 days. Symptoms are worse. Patient has a wet cough with clear sputum. Patient is reporting increased wheezing, shortness of breath and stuffiness. Advised patient to increase hydration, Tylenol for headache. Humidifier, Delsym over the counter.  COVID-19 swab ordered  Follow up with unresolved or worsening symptoms

## 2020-06-04 LAB — NOVEL CORONAVIRUS, NAA: SARS-CoV-2, NAA: NOT DETECTED

## 2020-06-04 LAB — SARS-COV-2, NAA 2 DAY TAT

## 2020-06-06 ENCOUNTER — Telehealth: Payer: Self-pay

## 2020-06-06 NOTE — Telephone Encounter (Signed)
Returned call lm refer to lab results.

## 2020-06-07 ENCOUNTER — Other Ambulatory Visit: Payer: Self-pay

## 2020-06-07 ENCOUNTER — Encounter: Payer: Self-pay | Admitting: Family Medicine

## 2020-06-07 ENCOUNTER — Ambulatory Visit (INDEPENDENT_AMBULATORY_CARE_PROVIDER_SITE_OTHER): Payer: Medicare Other | Admitting: Family Medicine

## 2020-06-07 VITALS — BP 160/90 | HR 77 | Temp 97.8°F | Ht 61.0 in | Wt 152.0 lb

## 2020-06-07 DIAGNOSIS — Z23 Encounter for immunization: Secondary | ICD-10-CM

## 2020-06-07 DIAGNOSIS — D692 Other nonthrombocytopenic purpura: Secondary | ICD-10-CM | POA: Diagnosis not present

## 2020-06-07 DIAGNOSIS — R2689 Other abnormalities of gait and mobility: Secondary | ICD-10-CM | POA: Diagnosis not present

## 2020-06-07 DIAGNOSIS — I482 Chronic atrial fibrillation, unspecified: Secondary | ICD-10-CM

## 2020-06-07 DIAGNOSIS — I10 Essential (primary) hypertension: Secondary | ICD-10-CM

## 2020-06-07 DIAGNOSIS — F0150 Vascular dementia without behavioral disturbance: Secondary | ICD-10-CM | POA: Diagnosis not present

## 2020-06-07 DIAGNOSIS — F99 Mental disorder, not otherwise specified: Secondary | ICD-10-CM | POA: Diagnosis not present

## 2020-06-07 DIAGNOSIS — R8290 Unspecified abnormal findings in urine: Secondary | ICD-10-CM | POA: Diagnosis not present

## 2020-06-07 DIAGNOSIS — J3089 Other allergic rhinitis: Secondary | ICD-10-CM | POA: Diagnosis not present

## 2020-06-07 MED ORDER — LORATADINE 10 MG PO TABS
10.0000 mg | ORAL_TABLET | Freq: Every day | ORAL | 11 refills | Status: DC
Start: 2020-06-07 — End: 2021-10-10

## 2020-06-07 NOTE — Progress Notes (Signed)
Subjective: CC: A. fib, memory, hypertension PCP: [name], DO [id number] Kari [name] is a 86 y.o. female presenting to clinic today for:  1.  Hypertension, atrial fibrillation Patient is compliant with atenolol, Eliquis and captopril.  She has easy bruising.  Does not report any heart palpitations or shortness of breath.  She has had an intermittent cough is been present for the last week or so.  It seems responsive to the Delsym.  At baseline she does have rhinorrhea but is not on any oral antihistamines.  2.  Dementia/urinary odor She is compliant with Aricept.  Her son does note that she seems to be a little more "zombie like" which sometimes is reflective of urinary tract infection.  He has noticed a urinary odor and would like to get her checked out for this.  No known fevers.  3.  Insect bites Patient is sustained several insect bites along bilateral upper extremities.  They seem to be somewhat inflamed.  She is not currently on any oral antihistamines nor applying anything topically.  She does report that they are very itchy  ROS: Per HPI  Allergies  Allergen Reactions  . Erythromycin Other (See Comments)    Reaction unknown  . Penicillins Other (See Comments)    Reaction unknown  Has patient had a PCN reaction causing immediate rash, facial/tongue/throat swelling, SOB or lightheadedness with hypotension: unknown Has patient had a PCN reaction causing severe rash involving mucus membranes or skin necrosis: unknown Has patient had a PCN reaction that required hospitalization : unknown Has patient had a PCN reaction occurring within the last 10 years: no If all of the above answers are "NO", then may proceed with Cephalosporin use.    Past Medical History:  Diagnosis Date  . Adenomatous polyps   . AF (atrial fibrillation) ([place])   . Cataract   . Gallstones   . Hx of adenomatous colonic polyps   . Hyperlipidemia    x5 years  . Hypertension    x5 years  .  Kyphosis   . Metabolic syndrome X   . Obesity   . Osteoporosis   . Stroke ([place]) 07/2017  . Subdural hematoma ([place])   . Symptomatic menopausal or female climacteric states   . Vitamin D deficiency     Current Outpatient Medications:  .  apixaban (ELIQUIS) 5 MG TABS tablet, Take 1 tablet (5 mg total) by mouth 2 (two) times daily., Disp: 60 tablet, Rfl: 5 .  atenolol (TENORMIN) 50 MG tablet, Take 1 tablet (50 mg total) by mouth daily., Disp: 90 tablet, Rfl: 3 .  Benfotiamine 150 MG CAPS, Take 150 mg by mouth daily., Disp: , Rfl:  .  captopril (CAPOTEN) 50 MG tablet, Take 1 tablet (50 mg total) by mouth 2 (two) times daily., Disp: 180 tablet, Rfl: 3 .  Dextromethorphan-guaiFENesin (DELSYM COUGH/CHEST CONGEST DM) 5-100 MG/5ML LIQD, 5 ml by mouth 3 time daily as needed, Disp: 237 mL, Rfl: 0 .  donepezil (ARICEPT) 10 MG tablet, Take 1 tablet (10 mg total) by mouth at bedtime., Disp: 90 tablet, Rfl: 3 .  ergocalciferol (VITAMIN D2) 1.25 MG (50000 UT) capsule, TAKE 1 CAPSULE ONCE A WEEK, Disp: 13 capsule, Rfl: 1 .  ezetimibe (ZETIA) 10 MG tablet, TAKE 1 TABLET ONCE DAILY FOR CHOLESTEROL, Disp: 90 tablet, Rfl: 3 .  ferrous sulfate 325 (65 FE) MG tablet, Take 325 mg by mouth daily., Disp: , Rfl:  .  potassium chloride (KLOR-CON) 10 MEQ tablet, TAKE 1 TABLET  DAILY, Disp: 90 tablet, Rfl: 0 .  pravastatin (PRAVACHOL) 80 MG tablet, TAKE 1 TABLET ONCE DAILY FOR CHOLESTEROL, Disp: 90 tablet, Rfl: 3 Social History   Socioeconomic History  . Marital status: Married    Spouse name: Ollen Gross   . Number of children: 2  . Years of education: Not on file  . Highest education level: Not on file  Occupational History  . Occupation: Farmer  Tobacco Use  . Smoking status: Never Smoker  . Smokeless tobacco: Never Used  Vaping Use  . Vaping Use: Never used  Substance and Sexual Activity  . Alcohol use: No  . Drug use: No  . Sexual activity: Not on file  Other Topics Concern  . Not on file  Social History  Narrative   Lives with her husband and son, independent   1 son Surveyor, minerals)   1 Daughter Contractor)   2 grandchildren and 3 great-grandchild   12th grade education   Social Determinants of Health   Financial Resource Strain:   . Difficulty of Paying Living Expenses: Not on file  Food Insecurity:   . Worried About Charity fundraiser in the Last Year: Not on file  . Ran Out of Food in the Last Year: Not on file  Transportation Needs:   . Lack of Transportation (Medical): Not on file  . Lack of Transportation (Non-Medical): Not on file  Physical Activity:   . Days of Exercise per Week: Not on file  . Minutes of Exercise per Session: Not on file  Stress:   . Feeling of Stress : Not on file  Social Connections:   . Frequency of Communication with Friends and Family: Not on file  . Frequency of Social Gatherings with Friends and Family: Not on file  . Attends Religious Services: Not on file  . Active Member of Clubs or Organizations: Not on file  . Attends Archivist Meetings: Not on file  . Marital Status: Not on file  Intimate Partner Violence:   . Fear of Current or Ex-Partner: Not on file  . Emotionally Abused: Not on file  . Physically Abused: Not on file  . Sexually Abused: Not on file   Family History  Problem Relation Age of Onset  . Heart attack Mother   . Stroke Mother   . Heart attack Father   . Alcohol abuse Father   . Hypertension Sister   . Stroke Sister   . Stroke Brother   . Hypertension Brother   . Heart attack Brother   . Hypertension Brother   . Depression Brother        suicide  . Hypertension Sister   . Heart attack Other   . Stroke Other   . Breast cancer Other   . Colon cancer Neg Hx     Objective: Office vital signs reviewed. BP (!) 160/90   Pulse 77   Temp 97.8 F (36.6 C) (Temporal)   Ht 5' 1"  (1.549 m)   Wt 152 lb (68.9 kg)   SpO2 96%   BMI 28.72 kg/m   Physical Examination:  General: Awake, alert, elderly,  frail-appearing female, No acute distress HEENT: Normal; sclera white Cardio: regular rate and rhythm, S1S2 heard, no murmurs appreciated Pulm: clear to auscultation bilaterally, no wheezes, rhonchi or rales; normal work of breathing on room air Extremities: warm, well perfused, No edema, cyanosis or clubbing; +1 pulses bilaterally MSK: Ambulates slowly with use of cane Skin: Several excoriated insect bites noted along bilateral  upper extremities.  No evidence of secondary infection.  Senile purpura noted Neuro: Resting tremor noted  MMSE - Mini Mental State Exam 06/07/2020 08/18/2019 05/18/2019  Orientation to time 0 4 3  Orientation to Place 2 4 4   Registration 3 3 3   Attention/ Calculation 1 4 5   Recall 0 0 0  Language- name 2 objects 2 2 2   Language- repeat 1 0 1  Language- follow 3 step command 2 3 2   Language- read & follow direction 0 1 1  Write a sentence 1 1 1   Copy design 0 0 1  Total score 12 22 23     Assessment/ Plan: 86 y.o. female   Vascular dementia without behavioral disturbance (HCC) - Plan: CMP14+EGFR, Vitamin B12, TSH, CBC with Differential  Essential hypertension - Plan: CMP14+EGFR  Chronic atrial fibrillation (HCC) - Plan: Vitamin B12, TSH, CBC with Differential  Abnormal MMSE - Plan: Urinalysis, Complete, Vitamin B12, TSH, CBC with Differential  Balance problem - Plan: Vitamin B12  Abnormal urine odor - Plan: Urinalysis, Complete  Non-seasonal allergic rhinitis, unspecified trigger  Purpura senilis (HCC)  Claritin added for allergic rhinitis and irritated skin secondary to insect bites.  She seemed to be rate controlled today on exam.  I wonder if she would benefit from reduction in the Eliquis 2.5 mg twice daily given age.  Will CC to her cardiologist for input.  Given her abrupt decline in her MMSE I am going to evaluate for other causes including acute infection given urinary odor and vitamin deficiency.  Unfortunately, she missed the collection kit  when she was urinating and I have sent home a collection kit with her son to try and get this completed in the next day or so.  With regards to her blood pressure, she has labile blood pressure and it fluctuates between low blood pressure and high blood pressure.  Given her frailty I hesitate to increase her blood pressure regimen as I feel that we would precipitate hypotension.  No orders of the defined types were placed in this encounter.  No orders of the defined types were placed in this encounter.    [name], DO [place] [phone]

## 2020-06-07 NOTE — Patient Instructions (Signed)
Loratidine for runny nose and itchy bug bites.  You had labs performed today.  You will be contacted with the results of the labs once they are available, usually in the next 3 business days for routine lab work.  If you have an active my chart account, they will be released to your MyChart.  If you prefer to have these labs released to you via telephone, please let us know.  If you had a pap smear or biopsy performed, expect to be contacted in about 7-10 days.

## 2020-06-08 ENCOUNTER — Other Ambulatory Visit: Payer: Medicare Other

## 2020-06-08 DIAGNOSIS — F99 Mental disorder, not otherwise specified: Secondary | ICD-10-CM | POA: Diagnosis not present

## 2020-06-08 DIAGNOSIS — R8290 Unspecified abnormal findings in urine: Secondary | ICD-10-CM | POA: Diagnosis not present

## 2020-06-08 LAB — CMP14+EGFR
ALT: 16 IU/L (ref 0–32)
AST: 28 IU/L (ref 0–40)
Albumin/Globulin Ratio: 1.5 (ref 1.2–2.2)
Albumin: 4.2 g/dL (ref 3.6–4.6)
Alkaline Phosphatase: 88 IU/L (ref 44–121)
BUN/Creatinine Ratio: 10 — ABNORMAL LOW (ref 12–28)
BUN: 6 mg/dL — ABNORMAL LOW (ref 8–27)
Bilirubin Total: 0.4 mg/dL (ref 0.0–1.2)
CO2: 25 mmol/L (ref 20–29)
Calcium: 9.4 mg/dL (ref 8.7–10.3)
Chloride: 99 mmol/L (ref 96–106)
Creatinine, Ser: 0.6 mg/dL (ref 0.57–1.00)
GFR calc Af Amer: 97 mL/min/{1.73_m2} (ref >59)
GFR calc non Af Amer: 84 mL/min/{1.73_m2} (ref >59)
Globulin, Total: 2.8 g/dL (ref 1.5–4.5)
Glucose: 106 mg/dL — ABNORMAL HIGH (ref 65–99)
Potassium: 3.7 mmol/L (ref 3.5–5.2)
Sodium: 145 mmol/L — ABNORMAL HIGH (ref 134–144)
Total Protein: 7 g/dL (ref 6.0–8.5)

## 2020-06-08 LAB — URINALYSIS, COMPLETE
Bilirubin, UA: NEGATIVE
Glucose, UA: NEGATIVE
Ketones, UA: NEGATIVE
Leukocytes,UA: NEGATIVE
Nitrite, UA: NEGATIVE
RBC, UA: NEGATIVE
Specific Gravity, UA: 1.02 (ref 1.005–1.030)
Urobilinogen, Ur: 2 mg/dL — ABNORMAL HIGH (ref 0.2–1.0)
pH, UA: 8.5 — ABNORMAL HIGH (ref 5.0–7.5)

## 2020-06-08 LAB — CBC WITH DIFFERENTIAL/PLATELET
Basophils Absolute: 0 10*3/uL (ref 0.0–0.2)
Basos: 0 %
EOS (ABSOLUTE): 0.2 10*3/uL (ref 0.0–0.4)
Eos: 3 %
Hematocrit: 44 % (ref 34.0–46.6)
Hemoglobin: 14.6 g/dL (ref 11.1–15.9)
Immature Grans (Abs): 0 10*3/uL (ref 0.0–0.1)
Immature Granulocytes: 0 %
Lymphocytes Absolute: 1.6 10*3/uL (ref 0.7–3.1)
Lymphs: 21 %
MCH: 31 pg (ref 26.6–33.0)
MCHC: 33.2 g/dL (ref 31.5–35.7)
MCV: 93 fL (ref 79–97)
Monocytes Absolute: 0.6 10*3/uL (ref 0.1–0.9)
Monocytes: 8 %
Neutrophils Absolute: 5 10*3/uL (ref 1.4–7.0)
Neutrophils: 68 %
Platelets: 222 10*3/uL (ref 150–450)
RBC: 4.71 x10E6/uL (ref 3.77–5.28)
RDW: 13 % (ref 11.7–15.4)
WBC: 7.5 10*3/uL (ref 3.4–10.8)

## 2020-06-08 LAB — VITAMIN B12: Vitamin B-12: 330 pg/mL (ref 232–1245)

## 2020-06-08 LAB — TSH: TSH: 3.58 u[IU]/mL (ref 0.450–4.500)

## 2020-06-08 LAB — MICROSCOPIC EXAMINATION
Bacteria, UA: NONE SEEN
RBC, Urine: NONE SEEN /hpf (ref 0–2)

## 2020-06-08 NOTE — Progress Notes (Signed)
By the letter of the law she would still need the higher dose.  However, there is an art of medicine and I will defer to your judgement of her frailty.

## 2020-06-09 NOTE — Telephone Encounter (Signed)
Daughter Harrison Mons for lab results

## 2020-07-01 ENCOUNTER — Other Ambulatory Visit: Payer: Self-pay | Admitting: Family Medicine

## 2020-08-02 ENCOUNTER — Ambulatory Visit: Payer: Medicare Other | Admitting: Cardiovascular Disease

## 2020-08-02 DIAGNOSIS — H539 Unspecified visual disturbance: Secondary | ICD-10-CM | POA: Diagnosis not present

## 2020-08-02 DIAGNOSIS — H1132 Conjunctival hemorrhage, left eye: Secondary | ICD-10-CM | POA: Diagnosis not present

## 2020-08-05 ENCOUNTER — Other Ambulatory Visit: Payer: Self-pay | Admitting: Family Medicine

## 2020-08-08 ENCOUNTER — Telehealth: Payer: Self-pay | Admitting: *Deleted

## 2020-08-08 NOTE — Telephone Encounter (Signed)
Called and spoke with the patient's son per the dpr. Appointment with Dr. Fletcher Anon has been rescheduled to 09/14/19 due to a schedule conflict. He has been advised to call back if anything is needed prior to the appointment.

## 2020-08-09 ENCOUNTER — Ambulatory Visit: Payer: Medicare Other | Admitting: Cardiovascular Disease

## 2020-08-29 ENCOUNTER — Ambulatory Visit (INDEPENDENT_AMBULATORY_CARE_PROVIDER_SITE_OTHER): Payer: Medicare Other

## 2020-08-29 DIAGNOSIS — Z0000 Encounter for general adult medical examination without abnormal findings: Secondary | ICD-10-CM | POA: Diagnosis not present

## 2020-08-29 NOTE — Patient Instructions (Signed)
  MEDICARE ANNUAL WELLNESS VISIT Health Maintenance Summary and Written Plan of Care  Ms. Kamen ,  Thank you for allowing me to perform your Medicare Annual Wellness Visit and for your ongoing commitment to your health.   Health Maintenance & Immunization History Health Maintenance  Topic Date Due  . DEXA SCAN  04/04/2019  . MAMMOGRAM  12/20/2020 (Originally 05/04/2014)  . TETANUS/TDAP  05/04/2021  . INFLUENZA VACCINE  Completed  . COVID-19 Vaccine  Completed  . PNA vac Low Risk Adult  Completed   Immunization History  Administered Date(s) Administered  . Fluad Quad(high Dose 65+) 06/07/2020  . Influenza Inj Mdck Quad Pf 05/18/2019  . Influenza Split 06/18/2015  . Influenza Whole 05/04/2010  . Influenza, High Dose Seasonal PF 05/25/2016, 06/03/2017, 07/01/2018  . Influenza,inj,Quad PF,6+ Mos 05/28/2013, 06/21/2014, 06/08/2015  . Influenza-Unspecified 06/03/2017, 05/18/2019  . Moderna Sars-Covid-2 Vaccination 09/16/2019, 10/14/2019, 06/27/2020  . Pneumococcal Conjugate-13 01/06/2014  . Pneumococcal Polysaccharide-23 10/05/1999, 02/02/2015  . Td 10/05/2007  . Tdap 05/05/2011    These are the patient goals that we discussed: Goals Addressed   None      This is a list of Health Maintenance Items that are overdue or due now: Health Maintenance Due  Topic Date Due  . DEXA SCAN  04/04/2019     Orders/Referrals Placed Today: No orders of the defined types were placed in this encounter.  (Contact our referral department at [phone] if you have not spoken with someone about your referral appointment within the next 5 days)    Follow-up Plan  Scheduled with Dr. Nadine Counts 10/06/2020 at 1:15pm

## 2020-08-29 NOTE — Progress Notes (Signed)
MEDICARE ANNUAL WELLNESS VISIT  08/29/2020  Telephone Visit Disclaimer This Medicare AWV was conducted by telephone due to national recommendations for restrictions regarding the COVID-19 Pandemic (e.g. social distancing).  I verified, using two identifiers, that I am speaking with Kari [name] or their authorized healthcare agent. I discussed the limitations, risks, security, and privacy concerns of performing an evaluation and management service by telephone and the potential availability of an in-person appointment in the future. The patient expressed understanding and agreed to proceed.  Location of Patient: Home Location of Provider (nurse):  [facility]  Subjective:    Kari [name] is a 86 y.o. female patient of [name], DO who had a Medicare Annual Wellness Visit today via telephone. Kari [name] is Retired and lives with their spouse. she has two children. she reports that she is socially active and does interact with friends/family regularly. she is not physically active.  Patient Care Team: [name], DO as PCP - General (Family Medicine) Minus Breeding, MD as Consulting Physician (Cardiology) Gatha Mayer, MD as Consulting Physician (Gastroenterology) Penni Bombard, MD as Consulting Physician (Neurology)  Advanced Directives 08/29/2020 08/24/2019 11/10/2018 07/01/2018 03/03/2018 08/25/2017 02/27/2017  Does Patient Have a Medical Advance Directive? No Yes No No No No No  Type of Advance Directive - Living will - - - - -  Does patient want to make changes to medical advance directive? - No - Patient declined - - - - -  Copy of [place] in Chart? - - - - - - -  Would patient like information on creating a medical advance directive? No - Patient declined - No - Patient declined Yes (MAU/Ambulatory/Procedural Areas - Information given) No - Patient declined No - Patient declined No - Patient declined   Pre-existing out of facility DNR order (yellow form or pink MOST form) - - - - - - -    Hospital Utilization Over the Past 12 Months: # of hospitalizations or ER visits: 0 # of surgeries: 0  Review of Systems    Patient reports that her overall health is unchanged compared to last year.  Patient Reported Readings (BP, Pulse, CBG, Weight, etc) none  Pain Assessment Pain : No/denies pain     Current Medications & Allergies (verified) Allergies as of 08/29/2020      Reactions   Erythromycin Other (See Comments)   Reaction unknown   Penicillins Other (See Comments)   Reaction unknown  Has patient had a PCN reaction causing immediate rash, facial/tongue/throat swelling, SOB or lightheadedness with hypotension: unknown Has patient had a PCN reaction causing severe rash involving mucus membranes or skin necrosis: unknown Has patient had a PCN reaction that required hospitalization : unknown Has patient had a PCN reaction occurring within the last 10 years: no If all of the above answers are "NO", then may proceed with Cephalosporin use.      Medication List       Accurate as of August 29, 2020  1:27 PM. If you have any questions, ask your nurse or doctor.        apixaban 5 MG Tabs tablet Commonly known as: Eliquis Take 1 tablet (5 mg total) by mouth 2 (two) times daily.   atenolol 50 MG tablet Commonly known as: TENORMIN Take 1 tablet (50 mg total) by mouth daily.   Benfotiamine 150 MG Caps Take 150 mg by mouth daily.   captopril 50 MG tablet Commonly known  as: CAPOTEN Take 1 tablet (50 mg total) by mouth 2 (two) times daily.   donepezil 10 MG tablet Commonly known as: ARICEPT Take 1 tablet (10 mg total) by mouth at bedtime.   ergocalciferol 1.25 MG (50000 UT) capsule Commonly known as: VITAMIN D2 TAKE 1 CAPSULE ONCE A WEEK   ezetimibe 10 MG tablet Commonly known as: ZETIA TAKE 1 TABLET ONCE DAILY FOR CHOLESTEROL   ferrous sulfate 325 (65 FE) MG  tablet Take 325 mg by mouth daily.   loratadine 10 MG tablet Commonly known as: CLARITIN Take 1 tablet (10 mg total) by mouth daily.   potassium chloride 10 MEQ tablet Commonly known as: KLOR-CON TAKE 1 TABLET DAILY   pravastatin 80 MG tablet Commonly known as: PRAVACHOL TAKE 1 TABLET ONCE DAILY FOR CHOLESTEROL       History (reviewed): Past Medical History:  Diagnosis Date  . Adenomatous polyps   . AF (atrial fibrillation) ([place])   . Cataract   . Gallstones   . Hx of adenomatous colonic polyps   . Hyperlipidemia    x5 years  . Hypertension    x5 years  . Kyphosis   . Metabolic syndrome X   . Obesity   . Osteoporosis   . Stroke ([place]) 07/2017  . Subdural hematoma ([place])   . Symptomatic menopausal or female climacteric states   . Vitamin D deficiency    Past Surgical History:  Procedure Laterality Date  . ABDOMINAL HYSTERECTOMY    . BLADDER SUSPENSION    . COLONOSCOPY  multiple  . ESOPHAGEAL DILATION N/A 03/03/2018   Procedure: ESOPHAGEAL DILATION;  Surgeon: Rogene Houston, MD;  [facility];  Service: Endoscopy;  Laterality: N/A;  . ESOPHAGOGASTRODUODENOSCOPY N/A 03/03/2018   Procedure: ESOPHAGOGASTRODUODENOSCOPY (EGD);  Surgeon: Rogene Houston, MD;  [facility];  Service: Endoscopy;  Laterality: N/A;  . EYE SURGERY Bilateral 09/2015  . VAGINAL HYSTERECTOMY     total   Family History  Problem Relation Age of Onset  . Heart attack Mother   . Stroke Mother   . Heart attack Father   . Alcohol abuse Father   . Hypertension Sister   . Stroke Sister   . Stroke Brother   . Hypertension Brother   . Heart attack Brother   . Hypertension Brother   . Depression Brother        suicide  . Hypertension Sister   . Heart attack Other   . Stroke Other   . Breast cancer Other   . Colon cancer Neg Hx    Social History   Socioeconomic History  . Marital status: Married    Spouse name: Ollen Gross   . Number of children: 2  . Years of education:  Not on file  . Highest education level: Not on file  Occupational History  . Occupation: Farmer  Tobacco Use  . Smoking status: Never Smoker  . Smokeless tobacco: Never Used  Vaping Use  . Vaping Use: Never used  Substance and Sexual Activity  . Alcohol use: No  . Drug use: No  . Sexual activity: Not on file  Other Topics Concern  . Not on file  Social History Narrative   Lives with her husband and son, independent   1 son Surveyor, minerals)   1 Daughter Contractor)   2 grandchildren and 3 great-grandchild   12th grade education   Social Determinants of Health   Financial Resource Strain: Not on file  Food Insecurity: Not on file  Transportation Needs: Not on file  Physical Activity: Not on file  Stress: Not on file  Social Connections: Not on file    Activities of Daily Living In your present state of health, do you have any difficulty performing the following activities: 08/29/2020  Hearing? N  Vision? N  Difficulty concentrating or making decisions? Y  Walking or climbing stairs? Y  Dressing or bathing? N  Doing errands, shopping? Y  Preparing Food and eating ? Y  Using the Toilet? N  In the past six months, have you accidently leaked urine? N  Do you have problems with loss of bowel control? N  Managing your Medications? Y  Managing your Finances? Y  Housekeeping or managing your Housekeeping? Y  Some recent data might be hidden    Patient Education/ Literacy How often do you need to have someone help you when you read instructions, pamphlets, or other written materials from your doctor or pharmacy?: 4 - Often What is the last grade level you completed in school?: 12th grade  Exercise Current Exercise Habits: The patient does not participate in regular exercise at present, Exercise limited by: neurologic condition(s)  Diet Patient reports consuming 3 meals a day and 2 snack(s) a day Patient reports that her primary diet is: Regular Patient reports that  she does have regular access to food.   Depression Screen PHQ 2/9 Scores 08/29/2020 06/07/2020 12/21/2019 12/03/2019 08/24/2019 08/18/2019 05/18/2019  PHQ - 2 Score 0 0 0 0 0 0 0  PHQ- 9 Score - 4 - - - 0 -     Fall Risk Fall Risk  08/29/2020 06/07/2020 12/21/2019 12/03/2019 08/24/2019  Falls in the past year? 0 0 0 0 0  Number falls in past yr: - - - - -  Comment - - - - -  Injury with Fall? - - - - -  Comment - - - - -  Risk Factor Category  - - - - -  Risk for fall due to : - - - - -  Risk for fall due to: Comment - - - - -  Follow up - - - - -     Objective:  Meryle Ready seemed alert and oriented and she participated appropriately during our telephone visit.  Blood Pressure Weight BMI  BP Readings from Last 3 Encounters:  06/07/20 (!) 160/90  12/21/19 (!) 124/92  12/03/19 (!) 166/81   Wt Readings from Last 3 Encounters:  06/07/20 152 lb (68.9 kg)  12/21/19 154 lb (69.9 kg)  12/03/19 152 lb (68.9 kg)   BMI Readings from Last 1 Encounters:  06/07/20 28.72 kg/m    *Unable to obtain current vital signs, weight, and BMI due to telephone visit type  Hearing/Vision  . [place] did not seem to have difficulty with hearing/understanding during the telephone conversation . Reports that she has had a formal eye exam by an eye care professional within the past year . Reports that she has not had a formal hearing evaluation within the past year *Unable to fully assess hearing and vision during telephone visit type  Cognitive Function: 6CIT Screen 08/29/2020 08/24/2019  What Year? 4 points 0 points  What month? 0 points 0 points  What time? 0 points 0 points  Count back from 20 2 points 0 points  Months in reverse 4 points 2 points  Repeat phrase 10 points 4 points  Total Score 20 6   (Normal:0-7, Significant for Dysfunction: >8)  Normal Cognitive Function Screening:  No: 20   Immunization & Health Maintenance Record Immunization History  Administered Date(s) Administered   . Fluad Quad(high Dose 65+) 06/07/2020  . Influenza Inj Mdck Quad Pf 05/18/2019  . Influenza Split 06/18/2015  . Influenza Whole 05/04/2010  . Influenza, High Dose Seasonal PF 05/25/2016, 06/03/2017, 07/01/2018  . Influenza,inj,Quad PF,6+ Mos 05/28/2013, 06/21/2014, 06/08/2015  . Influenza-Unspecified 06/03/2017, 05/18/2019  . Moderna Sars-Covid-2 Vaccination 09/16/2019, 10/14/2019, 06/27/2020  . Pneumococcal Conjugate-13 01/06/2014  . Pneumococcal Polysaccharide-23 10/05/1999, 02/02/2015  . Td 10/05/2007  . Tdap 05/05/2011    Health Maintenance  Topic Date Due  . DEXA SCAN  04/04/2019  . MAMMOGRAM  12/20/2020 (Originally 05/04/2014)  . TETANUS/TDAP  05/04/2021  . INFLUENZA VACCINE  Completed  . COVID-19 Vaccine  Completed  . PNA vac Low Risk Adult  Completed       Assessment  This is a routine wellness examination for Kari [name].  Health Maintenance: Due or Overdue Health Maintenance Due  Topic Date Due  . DEXA SCAN  04/04/2019    Kari [name] does not need a referral for Community Assistance: Care Management:   no Social Work:    no Prescription Assistance:  no Nutrition/Diabetes Education:  no   Plan:  Personalized Goals Goals Addressed   None    Personalized Health Maintenance & Screening Recommendations  Bone densitometry screening  Lung Cancer Screening Recommended: no (Low Dose CT Chest recommended if Age 60-80 years, 30 pack-year currently smoking OR have quit w/in past 15 years) Hepatitis C Screening recommended: no HIV Screening recommended: no  Advanced Directives: Written information was not prepared per patient's request.  Referrals & Orders No orders of the defined types were placed in this encounter.   Follow-up Plan . Follow-up with [name], DO as planned . Schedule 10/06/2020   I have personally reviewed and noted the following in the patient's chart:   . Medical and social history . Use of alcohol, tobacco or illicit  drugs  . Current medications and supplements . Functional ability and status . Nutritional status . Physical activity . Advanced directives . List of other physicians . Hospitalizations, surgeries, and ER visits in previous 12 months . Vitals . Screenings to include cognitive, depression, and falls . Referrals and appointments  In addition, I have reviewed and discussed with Kari [name] certain preventive protocols, quality metrics, and best practice recommendations. A written personalized care plan for preventive services as well as general preventive health recommendations is available and can be mailed to the patient at her request.      Maud Deed [facility]  [id number]

## 2020-09-13 ENCOUNTER — Ambulatory Visit: Payer: Medicare Other | Admitting: Cardiovascular Disease

## 2020-10-04 ENCOUNTER — Ambulatory Visit (INDEPENDENT_AMBULATORY_CARE_PROVIDER_SITE_OTHER): Payer: Medicare Other | Admitting: Cardiovascular Disease

## 2020-10-04 ENCOUNTER — Encounter: Payer: Self-pay | Admitting: Cardiovascular Disease

## 2020-10-04 ENCOUNTER — Other Ambulatory Visit: Payer: Self-pay

## 2020-10-04 VITALS — BP 140/88 | HR 88 | Ht 61.5 in | Wt 157.8 lb

## 2020-10-04 DIAGNOSIS — I482 Chronic atrial fibrillation, unspecified: Secondary | ICD-10-CM

## 2020-10-04 DIAGNOSIS — E785 Hyperlipidemia, unspecified: Secondary | ICD-10-CM

## 2020-10-04 DIAGNOSIS — I739 Peripheral vascular disease, unspecified: Secondary | ICD-10-CM

## 2020-10-04 NOTE — Patient Instructions (Signed)

## 2020-10-04 NOTE — Progress Notes (Signed)
Cardiology Office Note   Date:  10/04/2020   ID:  Kari [name], DOB Jan 29, 1934, MRN [id number]  PCP:  [name], DO  Cardiologist:  Dr. Percival Spanish  No chief complaint on file.     History of Present Illness: Kari [name] is a 87 y.o. female who is here today for follow-up visit regarding peripheral arterial disease . She has known history of atrial fibrillation, previous stroke while she is on warfarin, essential hypertension and hyperlipidemia.  She is not a smoker and has no history of diabetes.   She has known history of peripheral arterial disease with ABI in the 0.5 range with bilateral occlusion of popliteal artery.  Given minimal claudication and no critical limb ischemia.  She has been treated medically. She has been doing well with no recent chest pain, shortness of breath or palpitations.  She denies any leg claudication or ulceration.  Past Medical History:  Diagnosis Date  . Adenomatous polyps   . AF (atrial fibrillation) ([place])   . Cataract   . Gallstones   . Hx of adenomatous colonic polyps   . Hyperlipidemia    x5 years  . Hypertension    x5 years  . Kyphosis   . Metabolic syndrome X   . Obesity   . Osteoporosis   . Stroke ([place]) 07/2017  . Subdural hematoma ([place])   . Symptomatic menopausal or female climacteric states   . Vitamin D deficiency     Past Surgical History:  Procedure Laterality Date  . ABDOMINAL HYSTERECTOMY    . BLADDER SUSPENSION    . COLONOSCOPY  multiple  . ESOPHAGEAL DILATION N/A 03/03/2018   Procedure: ESOPHAGEAL DILATION;  Surgeon: Rogene Houston, MD;  [facility];  Service: Endoscopy;  Laterality: N/A;  . ESOPHAGOGASTRODUODENOSCOPY N/A 03/03/2018   Procedure: ESOPHAGOGASTRODUODENOSCOPY (EGD);  Surgeon: Rogene Houston, MD;  [facility];  Service: Endoscopy;  Laterality: N/A;  . EYE SURGERY Bilateral 09/2015  . VAGINAL HYSTERECTOMY     total     Current Outpatient Medications  Medication  Sig Dispense Refill  . apixaban (ELIQUIS) 5 MG TABS tablet Take 1 tablet (5 mg total) by mouth 2 (two) times daily. 60 tablet 5  . atenolol (TENORMIN) 50 MG tablet Take 1 tablet (50 mg total) by mouth daily. 90 tablet 3  . Benfotiamine 150 MG CAPS Take 150 mg by mouth daily.    . captopril (CAPOTEN) 50 MG tablet Take 1 tablet (50 mg total) by mouth 2 (two) times daily. 180 tablet 3  . [facility] CRANBERRY PO Take by mouth.    . donepezil (ARICEPT) 10 MG tablet Take 1 tablet (10 mg total) by mouth at bedtime. 90 tablet 3  . ergocalciferol (VITAMIN D2) 1.25 MG (50000 UT) capsule TAKE 1 CAPSULE ONCE A WEEK 13 capsule 1  . ezetimibe (ZETIA) 10 MG tablet TAKE 1 TABLET ONCE DAILY FOR CHOLESTEROL 90 tablet 3  . ferrous sulfate 325 (65 FE) MG tablet Take 325 mg by mouth daily.    Marland Kitchen loratadine (CLARITIN) 10 MG tablet Take 1 tablet (10 mg total) by mouth daily. 30 tablet 11  . Multiple Vitamins-Minerals (ZINC PO) Take by mouth.    . potassium chloride (KLOR-CON) 10 MEQ tablet TAKE 1 TABLET DAILY 90 tablet 0  . pravastatin (PRAVACHOL) 80 MG tablet TAKE 1 TABLET ONCE DAILY FOR CHOLESTEROL 90 tablet 3  . vitamin C (ASCORBIC ACID) 500 MG tablet Take 500 mg by mouth daily.    Marland Kitchen  VITAMIN D PO Take by mouth.     No current facility-administered medications for this visit.    Allergies:   Erythromycin and Penicillins    Social History:  The patient  reports that she has never smoked. She has never used smokeless tobacco. She reports that she does not drink alcohol and does not use drugs.   Family History:  The patient's family history includes Alcohol abuse in her father; Breast cancer in an other family member; Depression in her brother; Heart attack in her brother, father, mother, and another family member; Hypertension in her brother, brother, sister, and sister; Stroke in her brother, mother, sister, and another family member.    ROS:  Please see the history of present illness.   Otherwise, review of systems  are positive for none.   All other systems are reviewed and negative.    PHYSICAL EXAM: VS:  BP 140/88   Pulse 88   Ht 5' 1.5" (1.562 m)   Wt 157 lb 12.8 oz (71.6 kg)   BMI 29.33 kg/m  , BMI Body mass index is 29.33 kg/m. GEN: Well nourished, well developed, in no acute distress  HEENT: normal  Neck: no JVD, carotid bruits, or masses Cardiac: Irregularly irregular; no murmurs, rubs, or gallops,no edema  Respiratory:  clear to auscultation bilaterally, normal work of breathing GI: soft, nontender, nondistended, + BS MS: no deformity or atrophy  Skin: warm and dry, no rash Neuro:  Strength and sensation are intact Psych: euthymic mood, full affect Distal pulses are not palpable.  EKG:  EKG is ordered today. EKG showed atrial fibrillation with ventricular rate of 88 bpm.   Recent Labs: 06/07/2020: ALT 16; BUN 6; Creatinine, Ser 0.60; Hemoglobin 14.6; Platelets 222; Potassium 3.7; Sodium 145; TSH 3.580    Lipid Panel    Component Value Date/Time   CHOL 136 02/03/2019 1425   CHOL 123 03/25/2013 0938   TRIG 161 (H) 02/03/2019 1425   TRIG 93 01/04/2016 0836   TRIG 104 03/25/2013 0938   HDL 41 02/03/2019 1425   HDL 39 (L) 01/04/2016 0836   HDL 41 03/25/2013 0938   CHOLHDL 3.3 02/03/2019 1425   LDLCALC 63 02/03/2019 1425   LDLCALC 68 01/06/2014 0940   LDLCALC 61 03/25/2013 0938      Wt Readings from Last 3 Encounters:  10/04/20 157 lb 12.8 oz (71.6 kg)  06/07/20 152 lb (68.9 kg)  12/21/19 154 lb (69.9 kg)      PAD Screen 11/06/2017  Previous PAD dx? No  Previous surgical procedure? No  Pain with walking? No  Feet/toe relief with dangling? No  Painful, non-healing ulcers? No  Extremities discolored? No      ASSESSMENT AND PLAN:  1.  Peripheral arterial disease with  occluded popliteal artery bilaterally.  However, currently with no leg claudication or lower extremity ulceration.  She is asymptomatic.  Recommend continuing medical therapy.  2.  Chronic atrial  fibrillation, tolerating anticoagulation with Eliquis and ventricular rate is controlled.  3.  Hyperlipidemia: Currently on pravastatin and Zetia.  Most recent lipid profile showed an LDL of 63.   Disposition:   FU with me as needed if symptoms of peripheral arterial disease worsen.  Continue to follow-up with Dr. Percival Spanish for atrial fibrillation.  Signed,  Kathlyn Sacramento, MD  10/04/2020 1:12 PM    [facility]

## 2020-10-05 ENCOUNTER — Other Ambulatory Visit: Payer: Medicare Other

## 2020-10-05 DIAGNOSIS — Z20822 Contact with and (suspected) exposure to covid-19: Secondary | ICD-10-CM | POA: Diagnosis not present

## 2020-10-06 ENCOUNTER — Ambulatory Visit (INDEPENDENT_AMBULATORY_CARE_PROVIDER_SITE_OTHER): Payer: Medicare Other | Admitting: Family Medicine

## 2020-10-06 ENCOUNTER — Encounter: Payer: Self-pay | Admitting: Family Medicine

## 2020-10-06 DIAGNOSIS — E559 Vitamin D deficiency, unspecified: Secondary | ICD-10-CM

## 2020-10-06 DIAGNOSIS — I482 Chronic atrial fibrillation, unspecified: Secondary | ICD-10-CM | POA: Diagnosis not present

## 2020-10-06 DIAGNOSIS — J3489 Other specified disorders of nose and nasal sinuses: Secondary | ICD-10-CM

## 2020-10-06 DIAGNOSIS — R630 Anorexia: Secondary | ICD-10-CM | POA: Diagnosis not present

## 2020-10-06 DIAGNOSIS — R062 Wheezing: Secondary | ICD-10-CM | POA: Diagnosis not present

## 2020-10-06 DIAGNOSIS — F0150 Vascular dementia without behavioral disturbance: Secondary | ICD-10-CM

## 2020-10-06 DIAGNOSIS — Z862 Personal history of diseases of the blood and blood-forming organs and certain disorders involving the immune mechanism: Secondary | ICD-10-CM

## 2020-10-06 LAB — NOVEL CORONAVIRUS, NAA: SARS-CoV-2, NAA: NOT DETECTED

## 2020-10-06 LAB — SARS-COV-2, NAA 2 DAY TAT

## 2020-10-06 MED ORDER — AZELASTINE HCL 0.1 % NA SOLN: 1.0000 | Freq: Two times a day (BID) | NASAL | 12 refills | Status: DC

## 2020-10-06 NOTE — Addendum Note (Signed)
Addended by: [name] on: 10/06/2020 03:44 PM   Modules accepted: Orders, Level of Service

## 2020-10-06 NOTE — Progress Notes (Addendum)
Telephone visit  Subjective: CC: f/u afib, memory, appetite, diarrhea PCP: [name], DO VKP:QAESLP Kari [name] is a 87 y.o. female calls for telephone consult today. Patient provides verbal consent for consult held via phone.  Due to COVID-19 pandemic this visit was conducted virtually. This visit type was conducted due to national recommendations for restrictions regarding the COVID-19 Pandemic (e.g. social distancing, sheltering in place) in an effort to limit this patient's exposure and mitigate transmission in our community. All issues noted in this document were discussed and addressed.  A physical exam was not performed with this format.   Location of patient: home Location of provider: [facility] Others present for call: son  1.  Atrial fibrillation Patient is compliant with her medications.  No reports of bleeding but her son would like her to be evaluated for iron deficiency anemia as this is been an issue in the past  2.  Dementia with poor appetite and diarrhea She is compliant with Aricept.  Her sleep seems to be pretty good but they have noticed that she is had pretty profuse diarrhea and they worry that the medication may be affecting.  She does not have a very good appetite.  Previously they were able to supplement her with boost like drinks but now she will not even drink these.  She will snack on sugary foods but often does not hydrate adequately.  No reports of falls or dizziness.  3.  Vitamin D deficiency Patient is compliant with an over-the-counter vitamin D supplement.  She is on other multivitamins and they wonder if she really needs to be on 70 supplements.  Asking for evaluation of these.   ROS: Per HPI  Allergies  Allergen Reactions  . Erythromycin Other (See Comments)    Reaction unknown  . Penicillins Other (See Comments)    Reaction unknown  Has patient had a PCN reaction causing immediate rash, facial/tongue/throat swelling, SOB or lightheadedness with  hypotension: unknown Has patient had a PCN reaction causing severe rash involving mucus membranes or skin necrosis: unknown Has patient had a PCN reaction that required hospitalization : unknown Has patient had a PCN reaction occurring within the last 10 years: no If all of the above answers are "NO", then may proceed with Cephalosporin use.    Past Medical History:  Diagnosis Date  . Adenomatous polyps   . AF (atrial fibrillation) ([place])   . Cataract   . Gallstones   . Hx of adenomatous colonic polyps   . Hyperlipidemia    x5 years  . Hypertension    x5 years  . Kyphosis   . Metabolic syndrome X   . Obesity   . Osteoporosis   . Stroke ([place]) 07/2017  . Subdural hematoma ([place])   . Symptomatic menopausal or female climacteric states   . Vitamin D deficiency     Current Outpatient Medications:  .  apixaban (ELIQUIS) 5 MG TABS tablet, Take 1 tablet (5 mg total) by mouth 2 (two) times daily., Disp: 60 tablet, Rfl: 5 .  atenolol (TENORMIN) 50 MG tablet, Take 1 tablet (50 mg total) by mouth daily., Disp: 90 tablet, Rfl: 3 .  Benfotiamine 150 MG CAPS, Take 150 mg by mouth daily., Disp: , Rfl:  .  captopril (CAPOTEN) 50 MG tablet, Take 1 tablet (50 mg total) by mouth 2 (two) times daily., Disp: 180 tablet, Rfl: 3 .  [facility] CRANBERRY PO, Take by mouth., Disp: , Rfl:  .  donepezil (ARICEPT) 10 MG tablet, Take  1 tablet (10 mg total) by mouth at bedtime., Disp: 90 tablet, Rfl: 3 .  ergocalciferol (VITAMIN D2) 1.25 MG (50000 UT) capsule, TAKE 1 CAPSULE ONCE A WEEK, Disp: 13 capsule, Rfl: 1 .  ezetimibe (ZETIA) 10 MG tablet, TAKE 1 TABLET ONCE DAILY FOR CHOLESTEROL, Disp: 90 tablet, Rfl: 3 .  ferrous sulfate 325 (65 FE) MG tablet, Take 325 mg by mouth daily., Disp: , Rfl:  .  loratadine (CLARITIN) 10 MG tablet, Take 1 tablet (10 mg total) by mouth daily., Disp: 30 tablet, Rfl: 11 .  Multiple Vitamins-Minerals (ZINC PO), Take by mouth., Disp: , Rfl:  .  potassium chloride (KLOR-CON) 10 MEQ  tablet, TAKE 1 TABLET DAILY, Disp: 90 tablet, Rfl: 0 .  pravastatin (PRAVACHOL) 80 MG tablet, TAKE 1 TABLET ONCE DAILY FOR CHOLESTEROL, Disp: 90 tablet, Rfl: 3 .  vitamin C (ASCORBIC ACID) 500 MG tablet, Take 500 mg by mouth daily., Disp: , Rfl:  .  VITAMIN D PO, Take by mouth., Disp: , Rfl:   Assessment/ Plan: 87 y.o. female   Chronic atrial fibrillation ([place]) - Plan: CMP14+EGFR, Magnesium, CANCELED: Magnesium  History of iron deficiency anemia - Plan: Anemia panel, Hemoglobin, fingerstick  Vitamin D deficiency - Plan: VITAMIN D 25 Hydroxy (Vit-D Deficiency, Fractures)  Wheezing  Rhinorrhea - Plan: azelastine (ASTELIN) 0.1 % nasal spray  Internal nasal lesion  Vascular dementia without behavioral disturbance (HCC)  Poor appetite  Check CMP, magnesium.  TSH was normal in October  Plan for anemia panel, hemoglobin given history of iron deficiency anemia.  Of note last hemoglobin was normal  Check vitamin D.  Currently on OTC vitamin D supplementation as previously needed prescription vitamin D  Questionable wheezing but this sounds like it is more of a grunting.  Unsure if this is related to underlying lung disease but seems to be absent when it is brought to her attention. ?  Manifestation of her dementia  I have added Astelin given her rhinorrhea that has apparently caused what sounds like a little lesion in her nare.  The lesion is improving with OTC management but we discussed that if symptoms worsen for any reason we will plan for Bactroban nasal ointment  Discontinue the Aricept given ongoing diarrhea.  I worry about dehydration in this patient who already has limited appetite, oral intake.  We can maybe consider Namenda instead but we will reassess this at her next visit.    Children will discuss possible mirtazapine to stimulate appetite  She will come in for her labs at her convenience.  Start time: 12:49pm (LVM); 12:59pm (LVM); 3:01pm End: 3:16pm  Total time spent  on patient care (including telephone call/ virtual visit): 15 minutes  [place], [place] [phone]

## 2020-10-07 ENCOUNTER — Other Ambulatory Visit: Payer: Self-pay | Admitting: Family Medicine

## 2020-11-15 ENCOUNTER — Other Ambulatory Visit: Payer: Self-pay

## 2020-11-15 ENCOUNTER — Encounter: Payer: Self-pay | Admitting: Nurse Practitioner

## 2020-11-15 ENCOUNTER — Ambulatory Visit (INDEPENDENT_AMBULATORY_CARE_PROVIDER_SITE_OTHER): Payer: Medicare Other | Admitting: Nurse Practitioner

## 2020-11-15 VITALS — BP 117/73 | HR 79 | Temp 98.0°F | Ht 61.5 in | Wt 157.4 lb

## 2020-11-15 DIAGNOSIS — Z862 Personal history of diseases of the blood and blood-forming organs and certain disorders involving the immune mechanism: Secondary | ICD-10-CM

## 2020-11-15 NOTE — Patient Instructions (Signed)

## 2020-11-15 NOTE — Progress Notes (Signed)
Acute Office Visit  Subjective:    Patient ID: Kari [name], female    DOB: October 21, 1933, 87 y.o.   MRN: [id number]  No chief complaint on file.   HPI Patient is an 87 year old female who presents to clinic with questions about anemia.  Patient is on 325 mg ferrous sulfate daily, I would like to know if to continue on current dose.  Patient is not reporting any fatigue, dizziness, or headaches.  Patient also reports that lately she has been feeling sensation in her throat that makes her hear sounds when she is breathing.  Patient is not experiencing shortness of breath or any difficulty swallowing at this time.  No changes in sleep patterns either.   Past Medical History:  Diagnosis Date  . Adenomatous polyps   . AF (atrial fibrillation) ([place])   . Cataract   . Gallstones   . Hx of adenomatous colonic polyps   . Hyperlipidemia    x5 years  . Hypertension    x5 years  . Kyphosis   . Metabolic syndrome X   . Obesity   . Osteoporosis   . Stroke ([place]) 07/2017  . Subdural hematoma ([place])   . Symptomatic menopausal or female climacteric states   . Vitamin D deficiency     Past Surgical History:  Procedure Laterality Date  . ABDOMINAL HYSTERECTOMY    . BLADDER SUSPENSION    . COLONOSCOPY  multiple  . ESOPHAGEAL DILATION N/A 03/03/2018   Procedure: ESOPHAGEAL DILATION;  Surgeon: Rogene Houston, MD;  [facility];  Service: Endoscopy;  Laterality: N/A;  . ESOPHAGOGASTRODUODENOSCOPY N/A 03/03/2018   Procedure: ESOPHAGOGASTRODUODENOSCOPY (EGD);  Surgeon: Rogene Houston, MD;  Location[facility];  Service: Endoscopy;  Laterality: N/A;  . EYE SURGERY Bilateral 09/2015  . VAGINAL HYSTERECTOMY     total    Family History  Problem Relation Age of Onset  . Heart attack Mother   . Stroke Mother   . Heart attack Father   . Alcohol abuse Father   . Hypertension Sister   . Stroke Sister   . Stroke Brother   . Hypertension Brother   . Heart attack Brother   .  Hypertension Brother   . Depression Brother        suicide  . Hypertension Sister   . Heart attack Other   . Stroke Other   . Breast cancer Other   . Colon cancer Neg Hx     Social History   Socioeconomic History  . Marital status: Married    Spouse name: Ollen Gross   . Number of children: 2  . Years of education: Not on file  . Highest education level: Not on file  Occupational History  . Occupation: Farmer  Tobacco Use  . Smoking status: Never Smoker  . Smokeless tobacco: Never Used  Vaping Use  . Vaping Use: Never used  Substance and Sexual Activity  . Alcohol use: No  . Drug use: No  . Sexual activity: Not on file  Other Topics Concern  . Not on file  Social History Narrative   Lives with her husband and son, independent   1 son Surveyor, minerals)   1 Daughter Contractor)   2 grandchildren and 3 great-grandchild   12th grade education   Social Determinants of Health   Financial Resource Strain: Not on file  Food Insecurity: Not on file  Transportation Needs: Not on file  Physical Activity: Not on file  Stress: Not on file  Social Connections: Not on file  Intimate Partner Violence: Not on file    Outpatient Medications Prior to Visit  Medication Sig Dispense Refill  . apixaban (ELIQUIS) 5 MG TABS tablet Take 1 tablet (5 mg total) by mouth 2 (two) times daily. 60 tablet 5  . atenolol (TENORMIN) 50 MG tablet Take 1 tablet (50 mg total) by mouth daily. 90 tablet 3  . azelastine (ASTELIN) 0.1 % nasal spray Place 1 spray into both nostrils 2 (two) times daily. 30 mL 12  . Benfotiamine 150 MG CAPS Take 150 mg by mouth daily.    . captopril (CAPOTEN) 50 MG tablet Take 1 tablet (50 mg total) by mouth 2 (two) times daily. 180 tablet 3  . [facility] CRANBERRY PO Take by mouth.    . ezetimibe (ZETIA) 10 MG tablet TAKE 1 TABLET ONCE DAILY FOR CHOLESTEROL 90 tablet 3  . ferrous sulfate 325 (65 FE) MG tablet Take 325 mg by mouth daily.    Marland Kitchen loratadine (CLARITIN) 10 MG tablet  Take 1 tablet (10 mg total) by mouth daily. 30 tablet 11  . Multiple Vitamins-Minerals (ZINC PO) Take by mouth.    . potassium chloride (KLOR-CON) 10 MEQ tablet TAKE 1 TABLET DAILY 90 tablet 0  . pravastatin (PRAVACHOL) 80 MG tablet TAKE 1 TABLET ONCE DAILY FOR CHOLESTEROL 90 tablet 3  . vitamin C (ASCORBIC ACID) 500 MG tablet Take 500 mg by mouth daily.    Marland Kitchen VITAMIN D PO Take by mouth.     No facility-administered medications prior to visit.    Allergies  Allergen Reactions  . Erythromycin Other (See Comments)    Reaction unknown  . Penicillins Other (See Comments)    Reaction unknown  Has patient had a PCN reaction causing immediate rash, facial/tongue/throat swelling, SOB or lightheadedness with hypotension: unknown Has patient had a PCN reaction causing severe rash involving mucus membranes or skin necrosis: unknown Has patient had a PCN reaction that required hospitalization : unknown Has patient had a PCN reaction occurring within the last 10 years: no If all of the above answers are "NO", then may proceed with Cephalosporin use.     Review of Systems  Constitutional: Negative.   HENT: Negative.   Cardiovascular: Negative.   Gastrointestinal: Negative.   Genitourinary: Negative.   Musculoskeletal: Negative.   Skin: Negative.   Neurological: Negative for dizziness, light-headedness, numbness and headaches.  All other systems reviewed and are negative.      Objective:    Physical Exam Vitals reviewed.  Constitutional:      Appearance: Normal appearance.  HENT:     Head: Normocephalic.     Nose: Nose normal.  Eyes:     Conjunctiva/sclera: Conjunctivae normal.  Cardiovascular:     Rate and Rhythm: Normal rate and regular rhythm.     Pulses: Normal pulses.     Heart sounds: Normal heart sounds.  Pulmonary:     Effort: Pulmonary effort is normal.     Breath sounds: Normal breath sounds.  Abdominal:     General: Bowel sounds are normal.  Musculoskeletal:         General: Normal range of motion.  Skin:    General: Skin is warm.  Neurological:     Mental Status: She is alert and oriented to person, place, and time.  Psychiatric:        Behavior: Behavior normal.     BP 117/73   Pulse 79   Temp 98 F (36.7 C) (Temporal)  Ht 5' 1.5" (1.562 m)   Wt 157 lb 6.4 oz (71.4 kg)   SpO2 96%   BMI 29.26 kg/m  Wt Readings from Last 3 Encounters:  11/15/20 157 lb 6.4 oz (71.4 kg)  10/04/20 157 lb 12.8 oz (71.6 kg)  06/07/20 152 lb (68.9 kg)    Health Maintenance Due  Topic Date Due  . DEXA SCAN  04/04/2019    There are no preventive care reminders to display for this patient.   Lab Results  Component Value Date   TSH 3.580 06/07/2020   Lab Results  Component Value Date   WBC 7.5 06/07/2020   HGB 14.6 06/07/2020   HCT 44.0 06/07/2020   MCV 93 06/07/2020   PLT 222 06/07/2020   Lab Results  Component Value Date   NA 145 (H) 06/07/2020   K 3.7 06/07/2020   CO2 25 06/07/2020   GLUCOSE 106 (H) 06/07/2020   BUN 6 (L) 06/07/2020   CREATININE 0.60 06/07/2020   BILITOT 0.4 06/07/2020   ALKPHOS 88 06/07/2020   AST 28 06/07/2020   ALT 16 06/07/2020   PROT 7.0 06/07/2020   ALBUMIN 4.2 06/07/2020   CALCIUM 9.4 06/07/2020   ANIONGAP 7 11/10/2018   Lab Results  Component Value Date   CHOL 136 02/03/2019   Lab Results  Component Value Date   HDL 41 02/03/2019   Lab Results  Component Value Date   LDLCALC 63 02/03/2019   Lab Results  Component Value Date   TRIG 161 (H) 02/03/2019   Lab Results  Component Value Date   CHOLHDL 3.3 02/03/2019   Lab Results  Component Value Date   HGBA1C 6.0 02/08/2016       Assessment & Plan:   Problem List Items Addressed This Visit      Other   History of iron deficiency anemia - Primary    Labs completed today iron panel and CBC results pending.  Advised patient to continue medication until lab results come back.  Concerning patient's new symptoms of weird sensation in her  throat, after assessment patient is not having any obstruction in breathing or swallowing food/drinking water.  Lungs are clear and vital signs are within patient's limits.  Advised caregiver patient's daughter to continue to watch and wait and report any changes of new symptoms.  Caregiver verbalized understanding and no to follow-up appropriately.        Relevant Orders   CBC with Differential   Iron       No orders of the defined types were placed in this encounter.    Ivy Lynn, NP

## 2020-11-15 NOTE — Assessment & Plan Note (Addendum)
Labs completed today iron panel and CBC results pending.  Advised patient to continue medication until lab results come back.  Concerning patient's new symptoms of weird sensation in her throat, after assessment patient is not having any obstruction in breathing or swallowing food/drinking water.  Lungs are clear and vital signs are within patient's limits.  Advised caregiver patient's daughter to continue to watch and wait and report any changes of new symptoms.  Caregiver verbalized understanding and no to follow-up appropriately.

## 2020-11-16 LAB — CBC WITH DIFFERENTIAL/PLATELET
Basophils Absolute: 0 10*3/uL (ref 0.0–0.2)
Basos: 1 %
EOS (ABSOLUTE): 0.2 10*3/uL (ref 0.0–0.4)
Eos: 3 %
Hematocrit: 46.7 % — ABNORMAL HIGH (ref 34.0–46.6)
Hemoglobin: 15 g/dL (ref 11.1–15.9)
Immature Grans (Abs): 0 10*3/uL (ref 0.0–0.1)
Immature Granulocytes: 0 %
Lymphocytes Absolute: 1.5 10*3/uL (ref 0.7–3.1)
Lymphs: 19 %
MCH: 29.5 pg (ref 26.6–33.0)
MCHC: 32.1 g/dL (ref 31.5–35.7)
MCV: 92 fL (ref 79–97)
Monocytes Absolute: 0.5 10*3/uL (ref 0.1–0.9)
Monocytes: 6 %
Neutrophils Absolute: 5.5 10*3/uL (ref 1.4–7.0)
Neutrophils: 71 %
Platelets: 224 10*3/uL (ref 150–450)
RBC: 5.08 x10E6/uL (ref 3.77–5.28)
RDW: 14.3 % (ref 11.7–15.4)
WBC: 7.8 10*3/uL (ref 3.4–10.8)

## 2020-11-16 LAB — IRON: Iron: 81 ug/dL (ref 27–139)

## 2020-11-29 DIAGNOSIS — L84 Corns and callosities: Secondary | ICD-10-CM | POA: Diagnosis not present

## 2020-11-29 DIAGNOSIS — B351 Tinea unguium: Secondary | ICD-10-CM | POA: Diagnosis not present

## 2020-11-29 DIAGNOSIS — M79676 Pain in unspecified toe(s): Secondary | ICD-10-CM | POA: Diagnosis not present

## 2020-11-29 DIAGNOSIS — E1142 Type 2 diabetes mellitus with diabetic polyneuropathy: Secondary | ICD-10-CM | POA: Diagnosis not present

## 2020-12-07 ENCOUNTER — Telehealth: Payer: Self-pay | Admitting: *Deleted

## 2020-12-07 NOTE — Telephone Encounter (Signed)
Left message for patient to call back in regards to patient assistance. We have page 4 but need page 1-3 to fax back also which has patients signature.

## 2020-12-07 NOTE — Telephone Encounter (Signed)
Message left for son that page 4 is complete and is ready for pick up or they can bring pg 1-3 and we can fax it in for them. Form is in clinical lead office.

## 2020-12-07 NOTE — Telephone Encounter (Signed)
Son called back--he says that whenever page 4 is ready--he will come by the office to pick and mail in. Please leave up front for him to pick up tomorrow around lunch time.

## 2020-12-15 DIAGNOSIS — H52203 Unspecified astigmatism, bilateral: Secondary | ICD-10-CM | POA: Diagnosis not present

## 2020-12-15 DIAGNOSIS — H538 Other visual disturbances: Secondary | ICD-10-CM | POA: Diagnosis not present

## 2020-12-21 ENCOUNTER — Other Ambulatory Visit: Payer: Self-pay

## 2020-12-21 MED ORDER — ELIQUIS 5 MG PO TABS: ORAL_TABLET | ORAL | 0 refills | Status: DC

## 2020-12-30 ENCOUNTER — Other Ambulatory Visit: Payer: Self-pay | Admitting: Family Medicine

## 2021-01-13 ENCOUNTER — Other Ambulatory Visit: Payer: Self-pay | Admitting: Family Medicine

## 2021-01-24 NOTE — Progress Notes (Signed)
Cardiology Office Note   Date:  01/25/2021   ID:  Kari [name], DOB [birth year], MRN [id number]  PCP:  [name], DO  Cardiologist:   None   Chief Complaint  Patient presents with  . Atrial Fibrillation      History of Present Illness: Kari [name] is a 87 y.o. female who presents for followup of atrial fibrillation.  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  She was switched to Eliquis.   She did have a fall and a subdural hematoma.     She has progressive dementia.  Her son is with her and he takes care of her and says she has no acute cardiovascular complaints.  He said she would not be able to report symptoms but he never hears her complain about palpitations and she has had no presyncope or syncope.  She had no new shortness of breath.  She denies any chest pressure, neck or arm discomfort.  She is actually not describing any leg pain.   Past Medical History:  Diagnosis Date  . Adenomatous polyps   . AF (atrial fibrillation) ([place])   . Cataract   . Gallstones   . Hx of adenomatous colonic polyps   . Hyperlipidemia    x5 years  . Hypertension    x5 years  . Kyphosis   . Metabolic syndrome X   . Obesity   . Osteoporosis   . Stroke ([place]) 07/2017  . Subdural hematoma ([place])   . Symptomatic menopausal or female climacteric states   . Vitamin D deficiency     Past Surgical History:  Procedure Laterality Date  . ABDOMINAL HYSTERECTOMY    . BLADDER SUSPENSION    . COLONOSCOPY  multiple  . ESOPHAGEAL DILATION N/A 03/03/2018   Procedure: ESOPHAGEAL DILATION;  Surgeon: Rogene Houston, MD;  [facility];  Service: Endoscopy;  Laterality: N/A;  . ESOPHAGOGASTRODUODENOSCOPY N/A 03/03/2018   Procedure: ESOPHAGOGASTRODUODENOSCOPY (EGD);  Surgeon: Rogene Houston, MD;  [facility];  Service: Endoscopy;  Laterality: N/A;  . EYE SURGERY Bilateral 09/2015  . VAGINAL HYSTERECTOMY     total      Current Outpatient Medications  Medication Sig Dispense Refill  . apixaban (ELIQUIS) 5 MG TABS tablet Take 1 tablet (5 mg total) by mouth 2 (two) times daily. 28 tablet 0  . atenolol (TENORMIN) 50 MG tablet Take 1 tablet (50 mg total) by mouth daily. 90 tablet 3  . azelastine (ASTELIN) 0.1 % nasal spray Place 1 spray into both nostrils 2 (two) times daily. 30 mL 12  . Benfotiamine 150 MG CAPS Take 150 mg by mouth daily.    . captopril (CAPOTEN) 50 MG tablet TAKE 1 TABLET BY MOUTH 2 TIMES DAILY FOR HIGH BLOOD PRESSURE. 180 tablet 0  . [facility]S CRANBERRY PO Take by mouth.    . ezetimibe (ZETIA) 10 MG tablet TAKE 1 TABLET ONCE DAILY FOR CHOLESTEROL 90 tablet 0  . ferrous sulfate 325 (65 FE) MG tablet Take 325 mg by mouth daily.    Marland Kitchen loratadine (CLARITIN) 10 MG tablet Take 1 tablet (10 mg total) by mouth daily. 30 tablet 11  . Multiple Vitamins-Minerals (ZINC PO) Take by mouth.    . potassium chloride (KLOR-CON) 10 MEQ tablet TAKE 1 TABLET DAILY 90 tablet 0  . pravastatin (PRAVACHOL) 80 MG tablet TAKE 1 TABLET ONCE DAILY FOR CHOLESTEROL 90 tablet 3  . vitamin C (  ASCORBIC ACID) 500 MG tablet Take 500 mg by mouth daily.    Marland Kitchen VITAMIN D PO Take by mouth.    . donepezil (ARICEPT) 10 MG tablet Take 10 mg by mouth at bedtime.     No current facility-administered medications for this visit.    Allergies:   Erythromycin and Penicillins    ROS:  Please see the history of present illness.   Otherwise, review of systems are positive for none.   All other systems are reviewed and negative.    PHYSICAL EXAM: VS:  BP (!) 148/92   Pulse 78   Ht 5' 1.5" (1.562 m)   Wt 159 lb (72.1 kg)   BMI 29.56 kg/m  , BMI Body mass index is 29.56 kg/m. GENERAL:  Well appearing NECK:  No jugular venous distention, waveform within normal limits, carotid upstroke brisk and symmetric, no bruits, no thyromegaly LUNGS:  Clear to auscultation bilaterally CHEST:  Unremarkable HEART:  PMI not displaced or sustained,S1  and S2 within normal limits, no S3, no clicks, no rubs, no murmurs, irregular ABD:  Flat, positive bowel sounds normal in frequency in pitch, no bruits, no rebound, no guarding, no midline pulsatile mass, no hepatomegaly, no splenomegaly EXT:  2 plus pulses throughout, no edema, no cyanosis no clubbing   EKG:  EKG is not ordered today.   Recent Labs: 06/07/2020: ALT 16; BUN 6; Creatinine, Ser 0.60; Potassium 3.7; Sodium 145; TSH 3.580 11/15/2020: Hemoglobin 15.0; Platelets 224    Lipid Panel    Component Value Date/Time   CHOL 136 02/03/2019 1425   CHOL 123 03/25/2013 0938   TRIG 161 (H) 02/03/2019 1425   TRIG 93 01/04/2016 0836   TRIG 104 03/25/2013 0938   HDL 41 02/03/2019 1425   HDL 39 (L) 01/04/2016 0836   HDL 41 03/25/2013 0938   CHOLHDL 3.3 02/03/2019 1425   LDLCALC 63 02/03/2019 1425   LDLCALC 68 01/06/2014 0940   LDLCALC 61 03/25/2013 0938      Wt Readings from Last 3 Encounters:  01/25/21 159 lb (72.1 kg)  11/15/20 157 lb 6.4 oz (71.4 kg)  10/04/20 157 lb 12.8 oz (71.6 kg)      Other studies Reviewed: Additional studies/ records that were reviewed today include: Labs. Review of the above records demonstrates:  Please see elsewhere in the note.     ASSESSMENT AND PLAN:  ATRIAL FIBRILLATION:  Kari [name] has a CHA2DS2 - VASc score of 5.    She has no symptomatic tachypalpitations.  Her hemoglobin in March was normal.  No change in therapy.  She tolerates anticoagulation.  HTN:  Her blood pressure was low previously so I stopped her amlodipine.  It is running a little bit high today but her son will keep an eye on this with periodic blood pressure checks.  No change in therapy.     Current medicines are reviewed at length with the patient today.  The patient does not have concerns regarding medicines.  The following changes have been made:  no change  Labs/ tests ordered today include: None No orders of the defined types were placed in this  encounter.    Disposition:   FU with me in 12 month.     Signed, Minus Breeding, MD  01/25/2021 1:41 PM    [facility]

## 2021-01-25 ENCOUNTER — Ambulatory Visit (INDEPENDENT_AMBULATORY_CARE_PROVIDER_SITE_OTHER): Payer: Medicare Other | Admitting: Cardiology

## 2021-01-25 ENCOUNTER — Encounter: Payer: Self-pay | Admitting: Cardiology

## 2021-01-25 ENCOUNTER — Other Ambulatory Visit: Payer: Self-pay

## 2021-01-25 VITALS — BP 148/92 | HR 78 | Ht 61.5 in | Wt 159.0 lb

## 2021-01-25 DIAGNOSIS — I482 Chronic atrial fibrillation, unspecified: Secondary | ICD-10-CM

## 2021-01-25 DIAGNOSIS — I10 Essential (primary) hypertension: Secondary | ICD-10-CM

## 2021-01-25 NOTE — Patient Instructions (Signed)
Medication Instructions:  The current medical regimen is effective;  continue present plan and medications.  *If you need a refill on your cardiac medications before your next appointment, please call your pharmacy*  Follow-Up: At [facility], you and your health needs are our priority.  As part of our continuing mission to provide you with exceptional heart care, we have created designated Provider Care Teams.  These Care Teams include your primary Cardiologist (physician) and Advanced Practice Providers (APPs -  Physician Assistants and Nurse Practitioners) who all work together to provide you with the care you need, when you need it.  We recommend signing up for the patient portal called "MyChart".  Sign up information is provided on this After Visit Summary.  MyChart is used to connect with patients for Virtual Visits (Telemedicine).  Patients are able to view lab/test results, encounter notes, upcoming appointments, etc.  Non-urgent messages can be sent to your provider as well.   To learn more about what you can do with MyChart, go to [email or web address].    Your next appointment:   1 year(s)  The format for your next appointment:   In Person  Provider:   James Hochrein, MD   Thank you for choosing [facility]!!    

## 2021-02-24 ENCOUNTER — Ambulatory Visit: Payer: Medicare Other | Admitting: Nurse Practitioner

## 2021-03-10 ENCOUNTER — Other Ambulatory Visit: Payer: Self-pay | Admitting: Family Medicine

## 2021-03-10 NOTE — Telephone Encounter (Signed)
Patient needs to be seen. Last seen 10/06/2020.

## 2021-04-04 ENCOUNTER — Other Ambulatory Visit: Payer: Self-pay | Admitting: Family Medicine

## 2021-04-10 ENCOUNTER — Other Ambulatory Visit: Payer: Self-pay | Admitting: Family Medicine

## 2021-04-18 DIAGNOSIS — I70203 Unspecified atherosclerosis of native arteries of extremities, bilateral legs: Secondary | ICD-10-CM | POA: Diagnosis not present

## 2021-04-18 DIAGNOSIS — B351 Tinea unguium: Secondary | ICD-10-CM | POA: Diagnosis not present

## 2021-04-18 DIAGNOSIS — M79676 Pain in unspecified toe(s): Secondary | ICD-10-CM | POA: Diagnosis not present

## 2021-04-18 DIAGNOSIS — L84 Corns and callosities: Secondary | ICD-10-CM | POA: Diagnosis not present

## 2021-05-09 ENCOUNTER — Ambulatory Visit (INDEPENDENT_AMBULATORY_CARE_PROVIDER_SITE_OTHER): Payer: Medicare Other | Admitting: Family Medicine

## 2021-05-09 ENCOUNTER — Other Ambulatory Visit: Payer: Self-pay

## 2021-05-09 ENCOUNTER — Telehealth: Payer: Self-pay | Admitting: *Deleted

## 2021-05-09 ENCOUNTER — Other Ambulatory Visit: Payer: Self-pay | Admitting: Family Medicine

## 2021-05-09 ENCOUNTER — Encounter: Payer: Self-pay | Admitting: Family Medicine

## 2021-05-09 VITALS — BP 177/86 | HR 93 | Temp 97.4°F | Ht 61.5 in | Wt 157.8 lb

## 2021-05-09 DIAGNOSIS — F01518 Vascular dementia, unspecified severity, with other behavioral disturbance: Secondary | ICD-10-CM

## 2021-05-09 DIAGNOSIS — F0151 Vascular dementia with behavioral disturbance: Secondary | ICD-10-CM

## 2021-05-09 DIAGNOSIS — R300 Dysuria: Secondary | ICD-10-CM | POA: Diagnosis not present

## 2021-05-09 DIAGNOSIS — R410 Disorientation, unspecified: Secondary | ICD-10-CM

## 2021-05-09 LAB — CBC WITH DIFFERENTIAL/PLATELET
Basophils Absolute: 0 10*3/uL (ref 0.0–0.2)
Basos: 1 %
EOS (ABSOLUTE): 0.2 10*3/uL (ref 0.0–0.4)
Eos: 3 %
Hematocrit: 45.6 % (ref 34.0–46.6)
Hemoglobin: 14.8 g/dL (ref 11.1–15.9)
Immature Grans (Abs): 0 10*3/uL (ref 0.0–0.1)
Immature Granulocytes: 0 %
Lymphocytes Absolute: 1.4 10*3/uL (ref 0.7–3.1)
Lymphs: 18 %
MCH: 29.7 pg (ref 26.6–33.0)
MCHC: 32.5 g/dL (ref 31.5–35.7)
MCV: 92 fL (ref 79–97)
Monocytes Absolute: 0.6 10*3/uL (ref 0.1–0.9)
Monocytes: 8 %
Neutrophils Absolute: 5.6 10*3/uL (ref 1.4–7.0)
Neutrophils: 70 %
Platelets: 214 10*3/uL (ref 150–450)
RBC: 4.98 x10E6/uL (ref 3.77–5.28)
RDW: 13.5 % (ref 11.7–15.4)
WBC: 7.9 10*3/uL (ref 3.4–10.8)

## 2021-05-09 LAB — URINALYSIS, COMPLETE
Bilirubin, UA: NEGATIVE
Glucose, UA: NEGATIVE
Leukocytes,UA: NEGATIVE
Nitrite, UA: NEGATIVE
Protein,UA: NEGATIVE
RBC, UA: NEGATIVE
Specific Gravity, UA: 1.02 (ref 1.005–1.030)
Urobilinogen, Ur: 0.2 mg/dL (ref 0.2–1.0)
pH, UA: 6 (ref 5.0–7.5)

## 2021-05-09 LAB — MICROSCOPIC EXAMINATION
RBC, Urine: NONE SEEN /hpf (ref 0–2)
Renal Epithel, UA: NONE SEEN /hpf
WBC, UA: NONE SEEN /hpf (ref 0–5)

## 2021-05-09 LAB — CMP14+EGFR
ALT: 12 IU/L (ref 0–32)
AST: 21 IU/L (ref 0–40)
Albumin/Globulin Ratio: 1.3 (ref 1.2–2.2)
Albumin: 3.8 g/dL (ref 3.6–4.6)
Alkaline Phosphatase: 80 IU/L (ref 44–121)
BUN/Creatinine Ratio: 13 (ref 12–28)
BUN: 9 mg/dL (ref 8–27)
Bilirubin Total: 0.6 mg/dL (ref 0.0–1.2)
CO2: 26 mmol/L (ref 20–29)
Calcium: 9.6 mg/dL (ref 8.7–10.3)
Chloride: 99 mmol/L (ref 96–106)
Creatinine, Ser: 0.7 mg/dL (ref 0.57–1.00)
Globulin, Total: 3 g/dL (ref 1.5–4.5)
Glucose: 110 mg/dL — ABNORMAL HIGH (ref 65–99)
Potassium: 4.5 mmol/L (ref 3.5–5.2)
Sodium: 140 mmol/L (ref 134–144)
Total Protein: 6.8 g/dL (ref 6.0–8.5)
eGFR: 85 mL/min/{1.73_m2} (ref >59)

## 2021-05-09 MED ORDER — CEPHALEXIN 500 MG PO CAPS: 500.0000 mg | ORAL_CAPSULE | Freq: Four times a day (QID) | ORAL | 0 refills | Status: DC

## 2021-05-09 MED ORDER — RIVASTIGMINE 4.6 MG/24HR TD PT24: 4.6000 mg | MEDICATED_PATCH | Freq: Every day | TRANSDERMAL | 12 refills | Status: DC

## 2021-05-09 NOTE — Telephone Encounter (Signed)
Rivastigmine 4.'6MG'$ /24HR 24 hr patches Key: [id number] Sent to plan

## 2021-05-09 NOTE — Addendum Note (Signed)
Addended by: Baldomero Lamy B on: 05/09/2021 05:26 PM   Modules accepted: Orders

## 2021-05-09 NOTE — Progress Notes (Signed)
Subjective:  Patient ID: Kari [name], female    DOB: [birth year]  Age: 87 y.o. MRN: [id number]  CC: Hallucinations   HPI MACKINZIE VUNCANNON presents for won't eat or drink for a good while. Not taking water. Drank some ensure yesterday. Onset 2-3 days ago. Seeing people who aren't there. Bizarre behavior - regarding eating - can't get food on the fork. Tried to drink from a jar of honey. Turning her plate upside down. Sx are reminiscent of previous bouts of UTI - induces confusion.   She was prescribed donepezil, but it upset her stomach. Also had diarrhea that resolved when family held it.   Depression screen [facility] 2/9 05/09/2021 11/15/2020 08/29/2020  Decreased Interest 0 0 0  Down, Depressed, Hopeless 0 0 0  PHQ - 2 Score 0 0 0  Altered sleeping - - -  Tired, decreased energy - - -  Change in appetite - - -  Feeling bad or failure about yourself  - - -  Trouble concentrating - - -  Moving slowly or fidgety/restless - - -  Suicidal thoughts - - -  PHQ-9 Score - - -  Difficult doing work/chores - - -  Some recent data might be hidden    History Annaleia has a past medical history of Adenomatous polyps, AF (atrial fibrillation) ([place]), Cataract, Gallstones, adenomatous colonic polyps, Hyperlipidemia, Hypertension, Kyphosis, Metabolic syndrome X, Obesity, Osteoporosis, Stroke ([place]) (07/2017), Subdural hematoma (HCC), Symptomatic menopausal or female climacteric states, and Vitamin D deficiency.   She has a past surgical history that includes Vaginal hysterectomy; Bladder suspension; Colonoscopy (multiple); Eye surgery (Bilateral, 09/2015); Abdominal hysterectomy; Esophagogastroduodenoscopy (N/A, 03/03/2018); and Esophageal dilation (N/A, 03/03/2018).   Her family history includes Alcohol abuse in her father; Breast cancer in an other family member; Depression in her brother; Heart attack in her brother, father, mother, and another family member; Hypertension in her brother, brother, sister, and  sister; Stroke in her brother, mother, sister, and another family member.She reports that she has never smoked. She has never used smokeless tobacco. She reports that she does not drink alcohol and does not use drugs.    ROS Review of Systems  Unable to perform ROS: Dementia   Objective:  BP (!) 177/86   Pulse 93   Temp (!) 97.4 F (36.3 C)   Ht 5' 1.5" (1.562 m)   Wt 157 lb 12.8 oz (71.6 kg)   SpO2 94%   BMI 29.33 kg/m   BP Readings from Last 3 Encounters:  05/09/21 (!) 177/86  01/25/21 (!) 148/92  11/15/20 117/73    Wt Readings from Last 3 Encounters:  05/09/21 157 lb 12.8 oz (71.6 kg)  01/25/21 159 lb (72.1 kg)  11/15/20 157 lb 6.4 oz (71.4 kg)     Physical Exam Constitutional:      General: She is not in acute distress.    Appearance: She is well-developed.  HENT:     Head: Normocephalic and atraumatic.  Eyes:     Conjunctiva/sclera: Conjunctivae normal.     Pupils: Pupils are equal, round, and reactive to light.  Neck:     Thyroid: No thyromegaly.  Cardiovascular:     Rate and Rhythm: Normal rate and regular rhythm.     Heart sounds: Normal heart sounds. No murmur heard. Pulmonary:     Effort: Pulmonary effort is normal. No respiratory distress.     Breath sounds: Normal breath sounds. No wheezing or rales.  Abdominal:     General: Bowel sounds  are normal. There is no distension.     Palpations: Abdomen is soft.     Tenderness: There is no abdominal tenderness.  Musculoskeletal:        General: Normal range of motion.     Cervical back: Normal range of motion and neck supple.  Lymphadenopathy:     Cervical: No cervical adenopathy.  Skin:    General: Skin is warm and dry.  Neurological:     Mental Status: She is alert and oriented to person, place, and time.  Psychiatric:        Behavior: Behavior normal.        Thought Content: Thought content normal.        Judgment: Judgment normal.      Assessment & Plan:   Aulani was seen today for  hallucinations.  Diagnoses and all orders for this visit:  Dysuria -     Urinalysis, Complete -     CBC with Differential/Platelet -     CMP14+EGFR  Confusion -     CBC with Differential/Platelet -     CMP14+EGFR  Vascular dementia with behavior disturbance (HCC) -     CBC with Differential/Platelet -     CMP14+EGFR -     rivastigmine (EXELON) 4.6 mg/24hr; Place 1 patch (4.6 mg total) onto the skin daily.  Other orders -     cephALEXin (KEFLEX) 500 MG capsule; Take 1 capsule (500 mg total) by mouth 4 (four) times daily. For 7 days      I have discontinued [employer]hy[employer]'s donepezil. I am also having her start on rivastigmine. Additionally, I am having her maintain her Benfotiamine, ferrous sulfate, pravastatin, loratadine, Multiple Vitamins-Minerals (ZINC PO), VITAMIN D PO, [facility]S CRANBERRY PO, vitamin C, azelastine, Eliquis, atenolol, potassium chloride, ezetimibe, and cephALEXin.  Allergies as of 05/09/2021       Reactions   Erythromycin Other (See Comments)   Reaction unknown   Penicillins Other (See Comments)   Reaction unknown  Has patient had a PCN reaction causing immediate rash, facial/tongue/throat swelling, SOB or lightheadedness with hypotension: unknown Has patient had a PCN reaction causing severe rash involving mucus membranes or skin necrosis: unknown Has patient had a PCN reaction that required hospitalization : unknown Has patient had a PCN reaction occurring within the last 10 years: no If all of the above answers are "NO", then may proceed with Cephalosporin use.        Medication List        Accurate as of May 09, 2021 11:24 AM. If you have any questions, ask your nurse or doctor.          STOP taking these medications    donepezil 10 MG tablet Commonly known as: ARICEPT Stopped by: Claretta Fraise, MD       TAKE these medications    atenolol 50 MG tablet Commonly known as: TENORMIN TAKE 1 TABLET DAILY   azelastine 0.1 % nasal  spray Commonly known as: ASTELIN Place 1 spray into both nostrils 2 (two) times daily.   Benfotiamine 150 MG Caps Take 150 mg by mouth daily.   captopril 50 MG tablet Commonly known as: CAPOTEN TAKE 1 TABLET BY MOUTH 2 TIMES DAILY FOR HIGH BLOOD PRESSURE. What changed: See the new instructions. Changed by: Ronnie Doss, DO   cephALEXin 500 MG capsule Commonly known as: KEFLEX Take 1 capsule (500 mg total) by mouth 4 (four) times daily. For 7 days Started by: Claretta Fraise, MD   [facility]  PO Take by mouth.   Eliquis 5 MG Tabs tablet Generic drug: apixaban Take 1 tablet (5 mg total) by mouth 2 (two) times daily.   ezetimibe 10 MG tablet Commonly known as: ZETIA TAKE 1 TABLET ONCE DAILY FOR CHOLESTEROL (NEEDS TO BE SEEN BEFORE NEXT REFILL)   ferrous sulfate 325 (65 FE) MG tablet Take 325 mg by mouth daily.   loratadine 10 MG tablet Commonly known as: CLARITIN Take 1 tablet (10 mg total) by mouth daily.   potassium chloride 10 MEQ tablet Commonly known as: KLOR-CON TAKE 1 TABLET DAILY   pravastatin 80 MG tablet Commonly known as: PRAVACHOL TAKE 1 TABLET ONCE DAILY FOR CHOLESTEROL   rivastigmine 4.6 mg/24hr Commonly known as: Exelon Place 1 patch (4.6 mg total) onto the skin daily. Started by: Claretta Fraise, MD   vitamin C 500 MG tablet Commonly known as: ASCORBIC ACID Take 500 mg by mouth daily.   VITAMIN D PO Take by mouth.   ZINC PO Take by mouth.       Also , there seems to be an element of Alzheimers tha may respond to Rivastigmine. Will start a trial today.  Follow-up: Return in about 2 weeks (around 05/23/2021) for dementia, with Dr. Lajuana Ripple.  Claretta Fraise, M.D.

## 2021-05-10 NOTE — Telephone Encounter (Signed)
This was ordered by Dr Livia Snellen yesterday

## 2021-05-10 NOTE — Telephone Encounter (Signed)
Denied on September 6 Request Reference Number: [id number]. RIVASTIGMINE DIS 4.'6MG'$ /24 is denied for not meeting the prior authorization requirement(s). Details of this decision are in the notice attached below or have been faxed to you. Appeals are not supported through [place]. Please refer to the fax case notice for appeals information and instructions.

## 2021-05-10 NOTE — Telephone Encounter (Signed)
Hold off until she follows up with Dr Lajuana Ripple.

## 2021-05-11 NOTE — Progress Notes (Signed)
Hello Jaydi,  Your lab result is normal and/or stable.Some minor variations that are not significant are commonly marked abnormal, but do not represent any medical problem for you.  Best regards, Claretta Fraise, M.D.

## 2021-05-12 ENCOUNTER — Telehealth: Payer: Self-pay | Admitting: Family Medicine

## 2021-05-15 NOTE — Telephone Encounter (Signed)
Spoke with daughter, appointment scheduled on 06/01/21 at 4:25 pm with Dr. Livia Snellen.

## 2021-05-16 ENCOUNTER — Other Ambulatory Visit: Payer: Self-pay | Admitting: Family Medicine

## 2021-05-26 DIAGNOSIS — Z1152 Encounter for screening for COVID-19: Secondary | ICD-10-CM | POA: Diagnosis not present

## 2021-06-01 ENCOUNTER — Other Ambulatory Visit: Payer: Self-pay

## 2021-06-01 ENCOUNTER — Ambulatory Visit (INDEPENDENT_AMBULATORY_CARE_PROVIDER_SITE_OTHER): Payer: Medicare Other | Admitting: Family Medicine

## 2021-06-01 ENCOUNTER — Encounter: Payer: Self-pay | Admitting: Family Medicine

## 2021-06-01 VITALS — BP 116/87 | HR 81 | Temp 97.6°F | Ht 61.5 in | Wt 158.2 lb

## 2021-06-01 DIAGNOSIS — Z23 Encounter for immunization: Secondary | ICD-10-CM | POA: Diagnosis not present

## 2021-06-01 DIAGNOSIS — F0150 Vascular dementia without behavioral disturbance: Secondary | ICD-10-CM | POA: Diagnosis not present

## 2021-06-01 DIAGNOSIS — R630 Anorexia: Secondary | ICD-10-CM | POA: Diagnosis not present

## 2021-06-01 NOTE — Progress Notes (Addendum)
Subjective:  Patient ID: Kari [name], female    DOB: 22-Oct-1933  Age: 87 y.o. MRN: [id number]  CC: Follow-up   HPI Kari [name] presents for Recheck of demntia. Son gives hx. Says that insurance wouldn't cover the patch so they agreed to pay out of pocket. However, the pharmacy never got it in stock. Still waiting. He describes that she is forgetful, repetative. Tends to tongue meds and sometimes spits them out. She has a problem swallowing her potassium pill as well.    No further UTI sx.   Depression screen [facility] 2/9 06/01/2021 05/09/2021 11/15/2020  Decreased Interest 0 0 0  Down, Depressed, Hopeless 0 0 0  PHQ - 2 Score 0 0 0  Altered sleeping - - -  Tired, decreased energy - - -  Change in appetite - - -  Feeling bad or failure about yourself  - - -  Trouble concentrating - - -  Moving slowly or fidgety/restless - - -  Suicidal thoughts - - -  PHQ-9 Score - - -  Difficult doing work/chores - - -  Some recent data might be hidden    History Kari [name] has a past medical history of Adenomatous polyps, AF (atrial fibrillation) ([place]), Cataract, Gallstones, adenomatous colonic polyps, Hyperlipidemia, Hypertension, Kyphosis, Metabolic syndrome X, Obesity, Osteoporosis, Stroke ([place]) (07/2017), Subdural hematoma (HCC), Symptomatic menopausal or female climacteric states, and Vitamin D deficiency.   She has a past surgical history that includes Vaginal hysterectomy; Bladder suspension; Colonoscopy (multiple); Eye surgery (Bilateral, 09/2015); Abdominal hysterectomy; Esophagogastroduodenoscopy (N/A, 03/03/2018); and Esophageal dilation (N/A, 03/03/2018).   Her family history includes Alcohol abuse in her father; Breast cancer in an other family member; Depression in her brother; Heart attack in her brother, father, mother, and another family member; Hypertension in her brother, brother, sister, and sister; Stroke in her brother, mother, sister, and another family member.She reports that she has  never smoked. She has never used smokeless tobacco. She reports that she does not drink alcohol and does not use drugs.    ROS Review of Systems  Constitutional: Negative.   HENT: Negative.    Eyes:  Negative for visual disturbance.  Respiratory:  Negative for shortness of breath.   Cardiovascular:  Negative for chest pain.  Gastrointestinal:  Negative for abdominal pain.  Musculoskeletal:  Negative for arthralgias.   Objective:  BP 116/87   Pulse 81   Temp 97.6 F (36.4 C)   Ht 5' 1.5" (1.562 m)   Wt 158 lb 3.2 oz (71.8 kg)   SpO2 97%   BMI 29.41 kg/m   BP Readings from Last 3 Encounters:  06/01/21 116/87  05/09/21 (!) 177/86  01/25/21 (!) 148/92    Wt Readings from Last 3 Encounters:  06/01/21 158 lb 3.2 oz (71.8 kg)  05/09/21 157 lb 12.8 oz (71.6 kg)  01/25/21 159 lb (72.1 kg)     Physical Exam Constitutional:      General: She is not in acute distress.    Appearance: She is well-developed.  Cardiovascular:     Rate and Rhythm: Normal rate and regular rhythm.  Pulmonary:     Breath sounds: Normal breath sounds.  Musculoskeletal:        General: Normal range of motion.  Skin:    General: Skin is warm and dry.  Neurological:     Mental Status: She is alert and oriented to person, place, and time.      Assessment & Plan:   Kari [name] was seen  today for follow-up.  Diagnoses and all orders for this visit:  Need for immunization against influenza -     Flu Vaccine QUAD High Dose(Fluad)  Vascular dementia without behavioral disturbance (HCC)  Poor appetite      I have discontinued Kari [name]'s cephALEXin. I am also having her maintain her Benfotiamine, ferrous sulfate, pravastatin, loratadine, Multiple Vitamins-Minerals (ZINC PO), VITAMIN D PO, [facility] CRANBERRY PO, vitamin C, azelastine, Eliquis, atenolol, potassium chloride, captopril, rivastigmine, and ezetimibe.  Allergies as of 06/01/2021       Reactions   Erythromycin Other (See Comments)    Reaction unknown   Penicillins Other (See Comments)   Reaction unknown  Has patient had a PCN reaction causing immediate rash, facial/tongue/throat swelling, SOB or lightheadedness with hypotension: unknown Has patient had a PCN reaction causing severe rash involving mucus membranes or skin necrosis: unknown Has patient had a PCN reaction that required hospitalization : unknown Has patient had a PCN reaction occurring within the last 10 years: no If all of the above answers are "NO", then may proceed with Cephalosporin use.        Medication List        Accurate as of June 01, 2021 11:59 PM. If you have any questions, ask your nurse or doctor.          STOP taking these medications    cephALEXin 500 MG capsule Commonly known as: KEFLEX Stopped by: Claretta Fraise, MD       TAKE these medications    atenolol 50 MG tablet Commonly known as: TENORMIN TAKE 1 TABLET DAILY   azelastine 0.1 % nasal spray Commonly known as: ASTELIN Place 1 spray into both nostrils 2 (two) times daily.   Benfotiamine 150 MG Caps Take 150 mg by mouth daily.   captopril 50 MG tablet Commonly known as: CAPOTEN TAKE 1 TABLET BY MOUTH 2 TIMES DAILY FOR HIGH BLOOD PRESSURE.   [facility] CRANBERRY PO Take by mouth.   Eliquis 5 MG Tabs tablet Generic drug: apixaban Take 1 tablet (5 mg total) by mouth 2 (two) times daily.   ezetimibe 10 MG tablet Commonly known as: ZETIA TAKE 1 TABLET ONCE DAILY FOR CHOLESTEROL   ferrous sulfate 325 (65 FE) MG tablet Take 325 mg by mouth daily.   loratadine 10 MG tablet Commonly known as: CLARITIN Take 1 tablet (10 mg total) by mouth daily.   potassium chloride 10 MEQ tablet Commonly known as: KLOR-CON TAKE 1 TABLET DAILY   pravastatin 80 MG tablet Commonly known as: PRAVACHOL TAKE 1 TABLET ONCE DAILY FOR CHOLESTEROL   rivastigmine 4.6 mg/24hr Commonly known as: Exelon Place 1 patch (4.6 mg total) onto the skin daily.   vitamin C 500 MG  tablet Commonly known as: ASCORBIC ACID Take 500 mg by mouth daily.   VITAMIN D PO Take by mouth.   ZINC PO Take by mouth.         Follow-up: Return if symptoms worsen or fail to improve.  Claretta Fraise, M.D.

## 2021-06-07 ENCOUNTER — Other Ambulatory Visit: Payer: Self-pay | Admitting: Family Medicine

## 2021-06-16 ENCOUNTER — Other Ambulatory Visit: Payer: Self-pay | Admitting: Family Medicine

## 2021-06-20 ENCOUNTER — Other Ambulatory Visit: Payer: Self-pay | Admitting: Family Medicine

## 2021-06-27 ENCOUNTER — Other Ambulatory Visit: Payer: Self-pay | Admitting: Family Medicine

## 2021-06-29 ENCOUNTER — Ambulatory Visit (INDEPENDENT_AMBULATORY_CARE_PROVIDER_SITE_OTHER): Payer: Medicare Other | Admitting: Family Medicine

## 2021-06-29 ENCOUNTER — Encounter: Payer: Self-pay | Admitting: Family Medicine

## 2021-06-29 ENCOUNTER — Other Ambulatory Visit: Payer: Self-pay

## 2021-06-29 VITALS — BP 175/96 | HR 84 | Temp 97.6°F | Ht 61.5 in | Wt 157.5 lb

## 2021-06-29 DIAGNOSIS — R8290 Unspecified abnormal findings in urine: Secondary | ICD-10-CM

## 2021-06-29 DIAGNOSIS — R410 Disorientation, unspecified: Secondary | ICD-10-CM

## 2021-06-29 DIAGNOSIS — B3741 Candidal cystitis and urethritis: Secondary | ICD-10-CM

## 2021-06-29 LAB — URINALYSIS, ROUTINE W REFLEX MICROSCOPIC
Bilirubin, UA: NEGATIVE
Glucose, UA: NEGATIVE
Leukocytes,UA: NEGATIVE
Nitrite, UA: NEGATIVE
RBC, UA: NEGATIVE
Specific Gravity, UA: 1.025 (ref 1.005–1.030)
Urobilinogen, Ur: 1 mg/dL (ref 0.2–1.0)
pH, UA: 5.5 (ref 5.0–7.5)

## 2021-06-29 LAB — MICROSCOPIC EXAMINATION
RBC, Urine: NONE SEEN /hpf (ref 0–2)
Renal Epithel, UA: NONE SEEN /hpf

## 2021-06-29 MED ORDER — CEFDINIR 300 MG PO CAPS: 300.0000 mg | ORAL_CAPSULE | Freq: Two times a day (BID) | ORAL | 0 refills | Status: AC

## 2021-06-29 MED ORDER — FLUCONAZOLE 150 MG PO TABS: ORAL_TABLET | ORAL | 0 refills | Status: DC

## 2021-06-29 NOTE — Patient Instructions (Signed)
Urinary Tract Infection, Adult A urinary tract infection (UTI) is an infection of any part of the urinary tract. The urinary tract includes the kidneys, ureters, bladder, and urethra. These organs make, store, and get rid of urine in the body. An upper UTI affects the ureters and kidneys. A lower UTI affects the bladder and urethra. What are the causes? Most urinary tract infections are caused by bacteria in your genital area around your urethra, where urine leaves your body. These bacteria grow and cause inflammation of your urinary tract. What increases the risk? You are more likely to develop this condition if: You have a urinary catheter that stays in place. You are not able to control when you urinate or have a bowel movement (incontinence). You are female and you: Use a spermicide or diaphragm for birth control. Have low estrogen levels. Are pregnant. You have certain genes that increase your risk. You are sexually active. You take antibiotic medicines. You have a condition that causes your flow of urine to slow down, such as: An enlarged prostate, if you are female. Blockage in your urethra. A kidney stone. A nerve condition that affects your bladder control (neurogenic bladder). Not getting enough to drink, or not urinating often. You have certain medical conditions, such as: Diabetes. A weak disease-fighting system (immunesystem). Sickle cell disease. Gout. Spinal cord injury. What are the signs or symptoms? Symptoms of this condition include: Needing to urinate right away (urgency). Frequent urination. This may include small amounts of urine each time you urinate. Pain or burning with urination. Blood in the urine. Urine that smells bad or unusual. Trouble urinating. Cloudy urine. Vaginal discharge, if you are female. Pain in the abdomen or the lower back. You may also have: Vomiting or a decreased appetite. Confusion. Irritability or tiredness. A fever or  chills. Diarrhea. The first symptom in older adults may be confusion. In some cases, they may not have any symptoms until the infection has worsened. How is this diagnosed? This condition is diagnosed based on your medical history and a physical exam. You may also have other tests, including: Urine tests. Blood tests. Tests for STIs (sexually transmitted infections). If you have had more than one UTI, a cystoscopy or imaging studies may be done to determine the cause of the infections. How is this treated? Treatment for this condition includes: Antibiotic medicine. Over-the-counter medicines to treat discomfort. Drinking enough water to stay hydrated. If you have frequent infections or have other conditions such as a kidney stone, you may need to see a health care provider who specializes in the urinary tract (urologist). In rare cases, urinary tract infections can cause sepsis. Sepsis is a life-threatening condition that occurs when the body responds to an infection. Sepsis is treated in the hospital with IV antibiotics, fluids, and other medicines. Follow these instructions at home: Medicines Take over-the-counter and prescription medicines only as told by your health care provider. If you were prescribed an antibiotic medicine, take it as told by your health care provider. Do not stop using the antibiotic even if you start to feel better. General instructions Make sure you: Empty your bladder often and completely. Do not hold urine for long periods of time. Empty your bladder after sex. Wipe from front to back after urinating or having a bowel movement if you are female. Use each tissue only one time when you wipe. Drink enough fluid to keep your urine pale yellow. Keep all follow-up visits. This is important. Contact a health care provider   if: Your symptoms do not get better after 1-2 days. Your symptoms go away and then return. Get help right away if: You have severe pain in your  back or your lower abdomen. You have a fever or chills. You have nausea or vomiting. Summary A urinary tract infection (UTI) is an infection of any part of the urinary tract, which includes the kidneys, ureters, bladder, and urethra. Most urinary tract infections are caused by bacteria in your genital area. Treatment for this condition often includes antibiotic medicines. If you were prescribed an antibiotic medicine, take it as told by your health care provider. Do not stop using the antibiotic even if you start to feel better. Keep all follow-up visits. This is important. This information is not intended to replace advice given to you by your health care provider. Make sure you discuss any questions you have with your health care provider. Document Revised: 04/01/2020 Document Reviewed: 04/01/2020 Elsevier Patient Education  2022 [employer]  

## 2021-06-29 NOTE — Progress Notes (Signed)
Acute Office Visit  Subjective:    Patient ID: Kari [name], female    DOB: 1934/05/29, 87 y.o.   MRN: [id number]  Chief Complaint  Patient presents with   Altered Mental Status    HPI Here with daughter today who provided the history due to Valory's dementia. Patient is in today for confusion. She has been a little more confused for the last few days. She also has had a foul smell to her urine and her urine has been darker. This is typically for her when she has a UTI. Her appetite has been decreased as well. Denies chest pain, shortness of breath, fever, chills, dysuria, or focal weakness. She was started on exelon last week.   Past Medical History:  Diagnosis Date   Adenomatous polyps    AF (atrial fibrillation) (HCC)    Cataract    Gallstones    Hx of adenomatous colonic polyps    Hyperlipidemia    x5 years   Hypertension    x5 years   Kyphosis    Metabolic syndrome X    Obesity    Osteoporosis    Stroke ([place]) 07/2017   Subdural hematoma    Symptomatic menopausal or female climacteric states    Vitamin D deficiency     Past Surgical History:  Procedure Laterality Date   ABDOMINAL HYSTERECTOMY     BLADDER SUSPENSION     COLONOSCOPY  multiple   ESOPHAGEAL DILATION N/A 03/03/2018   Procedure: ESOPHAGEAL DILATION;  Surgeon: Rogene Houston, MD;  Location: [facility];  Service: Endoscopy;  Laterality: N/A;   ESOPHAGOGASTRODUODENOSCOPY N/A 03/03/2018   Procedure: ESOPHAGOGASTRODUODENOSCOPY (EGD);  Surgeon: Rogene Houston, MD;  Location: [facility];  Service: Endoscopy;  Laterality: N/A;   EYE SURGERY Bilateral 09/2015   VAGINAL HYSTERECTOMY     total    Family History  Problem Relation Age of Onset   Heart attack Mother    Stroke Mother    Heart attack Father    Alcohol abuse Father    Hypertension Sister    Stroke Sister    Stroke Brother    Hypertension Brother    Heart attack Brother    Hypertension Brother    Depression Brother        suicide    Hypertension Sister    Heart attack Other    Stroke Other    Breast cancer Other    Colon cancer Neg Hx     Social History   Socioeconomic History   Marital status: Married    Spouse name: Ollen Gross    Number of children: 2   Years of education: Not on file   Highest education level: Not on file  Occupational History   Occupation: Psychologist, sport and exercise  Tobacco Use   Smoking status: Never   Smokeless tobacco: Never  Vaping Use   Vaping Use: Never used  Substance and Sexual Activity   Alcohol use: No   Drug use: No   Sexual activity: Not on file  Other Topics Concern   Not on file  Social History Narrative   Lives with her husband and son, independent   1 son Surveyor, minerals)   1 Daughter Contractor)   2 grandchildren and 3 great-grandchild   12th grade education   Social Determinants of Health   Financial Resource Strain: Not on file  Food Insecurity: Not on file  Transportation Needs: Not on file  Physical Activity: Not on file  Stress: Not on file  Social  Connections: Not on file  Intimate Partner Violence: Not on file    Outpatient Medications Prior to Visit  Medication Sig Dispense Refill   apixaban (ELIQUIS) 5 MG TABS tablet Take 1 tablet (5 mg total) by mouth 2 (two) times daily. 28 tablet 0   atenolol (TENORMIN) 50 MG tablet TAKE 1 TABLET DAILY 90 tablet 1   Benfotiamine 150 MG CAPS Take 150 mg by mouth daily.     captopril (CAPOTEN) 50 MG tablet TAKE 1 TABLET BY MOUTH 2 TIMES DAILY FOR HIGH BLOOD PRESSURE. 180 tablet 3   [facility] CRANBERRY PO Take by mouth.     ezetimibe (ZETIA) 10 MG tablet TAKE 1 TABLET ONCE DAILY FOR CHOLESTEROL 90 tablet 3   loratadine (CLARITIN) 10 MG tablet Take 1 tablet (10 mg total) by mouth daily. 30 tablet 11   Multiple Vitamins-Minerals (ZINC PO) Take by mouth.     potassium chloride (KLOR-CON) 10 MEQ tablet TAKE 1 TABLET DAILY 90 tablet 0   pravastatin (PRAVACHOL) 80 MG tablet TAKE 1 TABLET ONCE DAILY FOR CHOLESTEROL 90 tablet 3    rivastigmine (EXELON) 4.6 mg/24hr Place 1 patch (4.6 mg total) onto the skin daily. 30 patch 12   vitamin C (ASCORBIC ACID) 500 MG tablet Take 500 mg by mouth daily.     VITAMIN D PO Take by mouth.     azelastine (ASTELIN) 0.1 % nasal spray Place 1 spray into both nostrils 2 (two) times daily. 30 mL 12   ferrous sulfate 325 (65 FE) MG tablet Take 325 mg by mouth daily.     No facility-administered medications prior to visit.    Allergies  Allergen Reactions   Erythromycin Other (See Comments)    Reaction unknown   Penicillins Other (See Comments)    Reaction unknown  Has patient had a PCN reaction causing immediate rash, facial/tongue/throat swelling, SOB or lightheadedness with hypotension: unknown Has patient had a PCN reaction causing severe rash involving mucus membranes or skin necrosis: unknown Has patient had a PCN reaction that required hospitalization : unknown Has patient had a PCN reaction occurring within the last 10 years: no If all of the above answers are "NO", then may proceed with Cephalosporin use.     Review of Systems As per HPI.    Objective:    Physical Exam Vitals and nursing note reviewed.  Constitutional:      General: She is not in acute distress.    Appearance: She is not ill-appearing, toxic-appearing or diaphoretic.  Cardiovascular:     Rate and Rhythm: Normal rate and regular rhythm.     Heart sounds: Normal heart sounds. No murmur heard. Pulmonary:     Effort: Pulmonary effort is normal. No respiratory distress.     Breath sounds: Normal breath sounds.  Musculoskeletal:     Right lower leg: No edema.     Left lower leg: No edema.  Skin:    General: Skin is warm and dry.  Neurological:     Mental Status: She is alert. Mental status is at baseline.  Psychiatric:        Behavior: Behavior normal.   Urine dipstick shows positive for protein and positive for ketones.  Micro exam: 0-5 WBC's per HPF, 0 RBC's per HPF, + yeast, and few-moderate +  bacteria.  BP (!) 175/96   Pulse 84   Temp 97.6 F (36.4 C) (Temporal)   Ht 5' 1.5" (1.562 m)   Wt 157 lb 8 oz (71.4 kg)  BMI 29.28 kg/m  Wt Readings from Last 3 Encounters:  06/29/21 157 lb 8 oz (71.4 kg)  06/01/21 158 lb 3.2 oz (71.8 kg)  05/09/21 157 lb 12.8 oz (71.6 kg)    Health Maintenance Due  Topic Date Due   Zoster Vaccines- Shingrix (1 of 2) Never done   MAMMOGRAM  05/04/2014   DEXA SCAN  04/04/2019   TETANUS/TDAP  05/04/2021   COVID-19 Vaccine (5 - Booster for Moderna series) 05/10/2021    There are no preventive care reminders to display for this patient.   Lab Results  Component Value Date   TSH 3.580 06/07/2020   Lab Results  Component Value Date   WBC 7.9 05/09/2021   HGB 14.8 05/09/2021   HCT 45.6 05/09/2021   MCV 92 05/09/2021   PLT 214 05/09/2021   Lab Results  Component Value Date   NA 140 05/09/2021   K 4.5 05/09/2021   CO2 26 05/09/2021   GLUCOSE 110 (H) 05/09/2021   BUN 9 05/09/2021   CREATININE 0.70 05/09/2021   BILITOT 0.6 05/09/2021   ALKPHOS 80 05/09/2021   AST 21 05/09/2021   ALT 12 05/09/2021   PROT 6.8 05/09/2021   ALBUMIN 3.8 05/09/2021   CALCIUM 9.6 05/09/2021   ANIONGAP 7 11/10/2018   EGFR 85 05/09/2021   Lab Results  Component Value Date   CHOL 136 02/03/2019   Lab Results  Component Value Date   HDL 41 02/03/2019   Lab Results  Component Value Date   LDLCALC 63 02/03/2019   Lab Results  Component Value Date   TRIG 161 (H) 02/03/2019   Lab Results  Component Value Date   CHOLHDL 3.3 02/03/2019   Lab Results  Component Value Date   HGBA1C 6.0 02/08/2016       Assessment & Plan:   Maansi was seen today for altered mental status.  Diagnoses and all orders for this visit:  Abnormal urine odor Few-moderate bacteria on micro today, culture pending. Start omnicef pending culture. Discussed increasing hydration.  -     Urinalysis, Routine w reflex microscopic -     Urine Culture -     cefdinir  (OMNICEF) 300 MG capsule; Take 1 capsule (300 mg total) by mouth 2 (two) times daily for 7 days. 1 po BID -     Microscopic Examination  Confusion Reports increased confusion from baseline over last two days. This is typical for patient with UTI presentation. Recently started on exelon.  -     Urinalysis, Routine w reflex microscopic -     Urine Culture -     Microscopic Examination  Yeast cystitis Diflucan as below.  -     fluconazole (DIFLUCAN) 150 MG tablet; Take one tablet by mouth now. Repeat dose in 7 days.  Return to office for new or worsening symptoms, or if symptoms persist.   The patient indicates understanding of these issues and agrees with the plan.   Gwenlyn Perking, FNP

## 2021-07-04 LAB — URINE CULTURE

## 2021-07-30 ENCOUNTER — Emergency Department (HOSPITAL_COMMUNITY): Payer: Medicare Other

## 2021-07-30 ENCOUNTER — Emergency Department (HOSPITAL_COMMUNITY)
Admission: EM | Admit: 2021-07-30 | Discharge: 2021-07-30 | Disposition: A | Discharge status: Home / Self Care | Payer: Medicare Other | Attending: Emergency Medicine | Admitting: Emergency Medicine

## 2021-07-30 ENCOUNTER — Other Ambulatory Visit: Payer: Self-pay

## 2021-07-30 DIAGNOSIS — S0990XA Unspecified injury of head, initial encounter: Secondary | ICD-10-CM | POA: Diagnosis present

## 2021-07-30 DIAGNOSIS — Z79899 Other long term (current) drug therapy: Secondary | ICD-10-CM | POA: Insufficient documentation

## 2021-07-30 DIAGNOSIS — S0003XA Contusion of scalp, initial encounter: Secondary | ICD-10-CM | POA: Insufficient documentation

## 2021-07-30 DIAGNOSIS — K5730 Diverticulosis of large intestine without perforation or abscess without bleeding: Secondary | ICD-10-CM | POA: Insufficient documentation

## 2021-07-30 DIAGNOSIS — M2578 Osteophyte, vertebrae: Secondary | ICD-10-CM | POA: Diagnosis not present

## 2021-07-30 DIAGNOSIS — S80211A Abrasion, right knee, initial encounter: Secondary | ICD-10-CM | POA: Diagnosis not present

## 2021-07-30 DIAGNOSIS — M47814 Spondylosis without myelopathy or radiculopathy, thoracic region: Secondary | ICD-10-CM | POA: Diagnosis not present

## 2021-07-30 DIAGNOSIS — S0083XA Contusion of other part of head, initial encounter: Secondary | ICD-10-CM | POA: Diagnosis not present

## 2021-07-30 DIAGNOSIS — M47816 Spondylosis without myelopathy or radiculopathy, lumbar region: Secondary | ICD-10-CM | POA: Diagnosis not present

## 2021-07-30 DIAGNOSIS — K8020 Calculus of gallbladder without cholecystitis without obstruction: Secondary | ICD-10-CM | POA: Diagnosis not present

## 2021-07-30 DIAGNOSIS — Y9289 Other specified places as the place of occurrence of the external cause: Secondary | ICD-10-CM | POA: Diagnosis not present

## 2021-07-30 DIAGNOSIS — I517 Cardiomegaly: Secondary | ICD-10-CM | POA: Diagnosis not present

## 2021-07-30 DIAGNOSIS — W01198A Fall on same level from slipping, tripping and stumbling with subsequent striking against other object, initial encounter: Secondary | ICD-10-CM | POA: Insufficient documentation

## 2021-07-30 DIAGNOSIS — I4891 Unspecified atrial fibrillation: Secondary | ICD-10-CM | POA: Diagnosis not present

## 2021-07-30 DIAGNOSIS — R1110 Vomiting, unspecified: Secondary | ICD-10-CM | POA: Diagnosis not present

## 2021-07-30 DIAGNOSIS — M4312 Spondylolisthesis, cervical region: Secondary | ICD-10-CM | POA: Diagnosis not present

## 2021-07-30 DIAGNOSIS — Z20822 Contact with and (suspected) exposure to covid-19: Secondary | ICD-10-CM | POA: Diagnosis not present

## 2021-07-30 DIAGNOSIS — F0390 Unspecified dementia without behavioral disturbance: Secondary | ICD-10-CM | POA: Diagnosis not present

## 2021-07-30 DIAGNOSIS — K7689 Other specified diseases of liver: Secondary | ICD-10-CM | POA: Diagnosis not present

## 2021-07-30 DIAGNOSIS — Z049 Encounter for examination and observation for unspecified reason: Secondary | ICD-10-CM | POA: Diagnosis not present

## 2021-07-30 DIAGNOSIS — I10 Essential (primary) hypertension: Secondary | ICD-10-CM | POA: Diagnosis not present

## 2021-07-30 DIAGNOSIS — S299XXA Unspecified injury of thorax, initial encounter: Secondary | ICD-10-CM | POA: Diagnosis not present

## 2021-07-30 DIAGNOSIS — I6529 Occlusion and stenosis of unspecified carotid artery: Secondary | ICD-10-CM | POA: Diagnosis not present

## 2021-07-30 DIAGNOSIS — Z7901 Long term (current) use of anticoagulants: Secondary | ICD-10-CM | POA: Insufficient documentation

## 2021-07-30 DIAGNOSIS — W06XXXA Fall from bed, initial encounter: Secondary | ICD-10-CM | POA: Diagnosis not present

## 2021-07-30 DIAGNOSIS — I700 Atherosclerosis of aorta: Secondary | ICD-10-CM | POA: Diagnosis not present

## 2021-07-30 DIAGNOSIS — W19XXXA Unspecified fall, initial encounter: Secondary | ICD-10-CM

## 2021-07-30 DIAGNOSIS — S3991XA Unspecified injury of abdomen, initial encounter: Secondary | ICD-10-CM | POA: Diagnosis not present

## 2021-07-30 DIAGNOSIS — I2510 Atherosclerotic heart disease of native coronary artery without angina pectoris: Secondary | ICD-10-CM | POA: Diagnosis not present

## 2021-07-30 LAB — CBC
HCT: 50.5 % — ABNORMAL HIGH (ref 36.0–46.0)
Hemoglobin: 15.5 g/dL — ABNORMAL HIGH (ref 12.0–15.0)
MCH: 29.2 pg (ref 26.0–34.0)
MCHC: 30.7 g/dL (ref 30.0–36.0)
MCV: 95.3 fL (ref 80.0–100.0)
Platelets: 219 10*3/uL (ref 150–400)
RBC: 5.3 MIL/uL — ABNORMAL HIGH (ref 3.87–5.11)
RDW: 14.3 % (ref 11.5–15.5)
WBC: 11.4 10*3/uL — ABNORMAL HIGH (ref 4.0–10.5)
nRBC: 0 % (ref 0.0–0.2)

## 2021-07-30 LAB — URINALYSIS, ROUTINE W REFLEX MICROSCOPIC
Bacteria, UA: NONE SEEN
Bilirubin Urine: NEGATIVE
Glucose, UA: NEGATIVE mg/dL
Hgb urine dipstick: NEGATIVE
Ketones, ur: 5 mg/dL — AB
Leukocytes,Ua: NEGATIVE
Nitrite: NEGATIVE
Protein, ur: 30 mg/dL — AB
Specific Gravity, Urine: 1.024 (ref 1.005–1.030)
pH: 8 (ref 5.0–8.0)

## 2021-07-30 LAB — COMPREHENSIVE METABOLIC PANEL
ALT: 17 U/L (ref 0–44)
AST: 28 U/L (ref 15–41)
Albumin: 3.8 g/dL (ref 3.5–5.0)
Alkaline Phosphatase: 69 U/L (ref 38–126)
Anion gap: 11 (ref 5–15)
BUN: 6 mg/dL — ABNORMAL LOW (ref 8–23)
CO2: 23 mmol/L (ref 22–32)
Calcium: 9.3 mg/dL (ref 8.9–10.3)
Chloride: 102 mmol/L (ref 98–111)
Creatinine, Ser: 0.63 mg/dL (ref 0.44–1.00)
GFR, Estimated: >60 mL/min (ref >60)
Glucose, Bld: 151 mg/dL — ABNORMAL HIGH (ref 70–99)
Potassium: 3.6 mmol/L (ref 3.5–5.1)
Sodium: 136 mmol/L (ref 135–145)
Total Bilirubin: 1 mg/dL (ref 0.3–1.2)
Total Protein: 7.3 g/dL (ref 6.5–8.1)

## 2021-07-30 LAB — RESP PANEL BY RT-PCR (FLU A&B, COVID) ARPGX2
Influenza A by PCR: NEGATIVE
Influenza B by PCR: NEGATIVE
SARS Coronavirus 2 by RT PCR: NEGATIVE

## 2021-07-30 LAB — PROTIME-INR
INR: 1.3 — ABNORMAL HIGH (ref 0.8–1.2)
Prothrombin Time: 15.7 seconds — ABNORMAL HIGH (ref 11.4–15.2)

## 2021-07-30 MED ORDER — IOHEXOL 300 MG/ML  SOLN
100.0000 mL | Freq: Once | INTRAMUSCULAR | Status: AC | PRN
  Administered 2021-07-30: 12:00:00 100 mL via INTRAVENOUS

## 2021-07-30 MED ORDER — LACTATED RINGERS IV BOLUS
500.0000 mL | Freq: Once | INTRAVENOUS | Status: AC
  Administered 2021-07-30: 17:00:00 500 mL via INTRAVENOUS

## 2021-07-30 MED ORDER — LACTATED RINGERS IV BOLUS: 500.0000 mL | Freq: Once | INTRAVENOUS | Status: DC

## 2021-07-30 MED ORDER — ONDANSETRON HCL 4 MG/2ML IJ SOLN: 4.0000 mg | Freq: Four times a day (QID) | INTRAMUSCULAR | Status: DC | PRN

## 2021-07-30 NOTE — ED Provider Notes (Signed)
[facility] EMERGENCY DEPARTMENT Provider Note   CSN: [id number] Arrival date & time: 07/30/21  1010     History Chief Complaint  Patient presents with   Kari [name] is a 87 y.o. female.   Fall Patient presents by EMS after a witnessed fall at home.  She has dementia.  She was being helped from the bed when she slipped.  The right side of her head struck the edge of a dresser.  No loss of consciousness was reported.  She is on Eliquis for atrial fibrillation.  Family reports that she has had some recent vomiting.  She had an episode of vomiting prior to her fall.  EMS reports 1 episode of vomiting in transit.  Currently, patient denies any areas of pain.  Family reports that she has moaning sounds with breathing at baseline.     Past Medical History:  Diagnosis Date   Adenomatous polyps    AF (atrial fibrillation) (HCC)    Cataract    Gallstones    Hx of adenomatous colonic polyps    Hyperlipidemia    x5 years   Hypertension    x5 years   Kyphosis    Metabolic syndrome X    Obesity    Osteoporosis    Stroke ([place]) 07/2017   Subdural hematoma    Symptomatic menopausal or female climacteric states    Vitamin D deficiency     Patient Active Problem List   Diagnosis Date Noted   History of iron deficiency anemia 11/15/2020   Cough 06/03/2020   Right flank pain 11/11/2018   Esophageal dysphagia 01/31/2018   Pain in both feet 10/03/2017   SDH (subdural hematoma) 08/24/2017   CVA (cerebral vascular accident) ([place]) 07/29/2017   Sciatica of right side 04/23/2016   At high risk for falls 04/23/2016   Increased BMI 09/30/2015   Hyperlipidemia 09/07/2015   Low back pain 09/07/2015   Orthostatic syncope 09/04/2015   Orthostatic hypotension 09/04/2015   Aortic atherosclerosis ([place]) 05/03/2015   Vitamin D deficiency 02/02/2015   Pre-diabetes 74/25/9563   Metabolic syndrome 87/56/4332   Osteoporosis 03/11/2013   Atrial fibrillation ([place])  11/24/2012   Hypokalemia 05/04/2012   Obesity    Symptomatic menopausal or female climacteric states    Kyphosis    Gallstones    Personal history of adenomatous colonic polyps    Essential hypertension 03/29/2009    Past Surgical History:  Procedure Laterality Date   ABDOMINAL HYSTERECTOMY     BLADDER SUSPENSION     COLONOSCOPY  multiple   ESOPHAGEAL DILATION N/A 03/03/2018   Procedure: ESOPHAGEAL DILATION;  Surgeon: Rogene Houston, MD;  Location: [facility] ENDO SUITE;  Service: Endoscopy;  Laterality: N/A;   ESOPHAGOGASTRODUODENOSCOPY N/A 03/03/2018   Procedure: ESOPHAGOGASTRODUODENOSCOPY (EGD);  Surgeon: Rogene Houston, MD;  Location: [facility] ENDO SUITE;  Service: Endoscopy;  Laterality: N/A;   EYE SURGERY Bilateral 09/2015   VAGINAL HYSTERECTOMY     total     OB History   No obstetric history on file.     Family History  Problem Relation Age of Onset   Heart attack Mother    Stroke Mother    Heart attack Father    Alcohol abuse Father    Hypertension Sister    Stroke Sister    Stroke Brother    Hypertension Brother    Heart attack Brother    Hypertension Brother    Depression Brother  suicide   Hypertension Sister    Heart attack Other    Stroke Other    Breast cancer Other    Colon cancer Neg Hx     Social History   Tobacco Use   Smoking status: Never   Smokeless tobacco: Never  Vaping Use   Vaping Use: Never used  Substance Use Topics   Alcohol use: No   Drug use: No    Home Medications Prior to Admission medications   Medication Sig Start Date End Date Taking? Authorizing Provider  apixaban (ELIQUIS) 5 MG TABS tablet Take 1 tablet (5 mg total) by mouth 2 (two) times daily. 12/21/20  Yes Gottschalk, Ashly M, DO  atenolol (TENORMIN) 50 MG tablet TAKE 1 TABLET DAILY Patient taking differently: Take 50 mg by mouth daily. 06/20/21  Yes Gottschalk, Ashly M, DO  Benfotiamine 150 MG CAPS Take 150 mg by mouth daily.   Yes [provider]  captopril  (CAPOTEN) 50 MG tablet TAKE 1 TABLET BY MOUTH 2 TIMES DAILY FOR HIGH BLOOD PRESSURE. Patient taking differently: Take 50 mg by mouth 2 (two) times daily. 06/07/21  Yes Gottschalk, Leatrice Jewels M, DO  ezetimibe (ZETIA) 10 MG tablet TAKE 1 TABLET ONCE DAILY FOR CHOLESTEROL Patient taking differently: Take 10 mg by mouth daily. 06/16/21  Yes Gottschalk, Leatrice Jewels M, DO  potassium chloride (KLOR-CON) 10 MEQ tablet TAKE 1 TABLET DAILY Patient taking differently: Take 10 mEq by mouth daily. 06/28/21  Yes Gottschalk, Ashly M, DO  pravastatin (PRAVACHOL) 80 MG tablet TAKE 1 TABLET ONCE DAILY FOR CHOLESTEROL Patient taking differently: Take 80 mg by mouth daily. 12/21/19  Yes Ronnie Doss M, DO  vitamin C (ASCORBIC ACID) 500 MG tablet Take 500 mg by mouth daily.   Yes [provider]  fluconazole (DIFLUCAN) 150 MG tablet Take one tablet by mouth now. Repeat dose in 7 days. Patient not taking: Reported on 07/30/2021 06/29/21   Gwenlyn Perking, FNP  loratadine (CLARITIN) 10 MG tablet Take 1 tablet (10 mg total) by mouth daily. Patient not taking: Reported on 07/30/2021 06/07/20   [name], DO  Multiple Vitamins-Minerals (ZINC PO) Take by mouth.    [provider]  rivastigmine (EXELON) 4.6 mg/24hr Place 1 patch (4.6 mg total) onto the skin daily. Patient not taking: Reported on 07/30/2021 05/09/21   Claretta Fraise, MD    Allergies    Erythromycin and Penicillins  Review of Systems   Review of Systems  Unable to perform ROS: Dementia   Physical Exam Updated Vital Signs BP (!) 196/94   Pulse (!) 34   Temp 98.3 F (36.8 C)   Resp (!) 24   Ht 5' 1.5" (1.562 m)   Wt 71.4 kg   SpO2 97%   BMI 29.26 kg/m   Physical Exam Vitals and nursing note reviewed.  Constitutional:      General: She is not in acute distress.    Appearance: Normal appearance. She is well-developed. She is not ill-appearing, toxic-appearing or diaphoretic.  HENT:     Head: Normocephalic.     Comments:  Hematoma to right parietal scalp    Right Ear: External ear normal.     Left Ear: External ear normal.     Nose: Nose normal.     Mouth/Throat:     Mouth: Mucous membranes are moist.     Pharynx: Oropharynx is clear.  Eyes:     General: No scleral icterus.    Extraocular Movements: Extraocular movements intact.  Conjunctiva/sclera: Conjunctivae normal.  Neck:     Comments: Cervical collar in place Cardiovascular:     Rate and Rhythm: Normal rate. Rhythm irregular.     Heart sounds: No murmur heard. Pulmonary:     Effort: Pulmonary effort is normal. No respiratory distress.     Breath sounds: Normal breath sounds. No wheezing or rales.  Chest:     Chest wall: No tenderness.  Abdominal:     Palpations: Abdomen is soft.     Tenderness: There is no abdominal tenderness. There is no right CVA tenderness or left CVA tenderness.  Musculoskeletal:        General: No swelling, tenderness or deformity.     Cervical back: Neck supple.     Right lower leg: No edema.     Left lower leg: No edema.  Skin:    General: Skin is warm and dry.     Capillary Refill: Capillary refill takes less than 2 seconds.     Coloration: Skin is not jaundiced or pale.  Neurological:     General: No focal deficit present.     Mental Status: She is alert. Mental status is at baseline.  Psychiatric:        Mood and Affect: Mood normal.    ED Results / Procedures / Treatments   Labs (all labs ordered are listed, but only abnormal results are displayed) Labs Reviewed  COMPREHENSIVE METABOLIC PANEL - Abnormal; Notable for the following components:      Result Value   Glucose, Bld 151 (*)    BUN 6 (*)    All other components within normal limits  CBC - Abnormal; Notable for the following components:   WBC 11.4 (*)    RBC 5.30 (*)    Hemoglobin 15.5 (*)    HCT 50.5 (*)    All other components within normal limits  URINALYSIS, ROUTINE W REFLEX MICROSCOPIC - Abnormal; Notable for the following  components:   APPearance CLOUDY (*)    Ketones, ur 5 (*)    Protein, ur 30 (*)    All other components within normal limits  PROTIME-INR - Abnormal; Notable for the following components:   Prothrombin Time 15.7 (*)    INR 1.3 (*)    All other components within normal limits  RESP PANEL BY RT-PCR (FLU A&B, COVID) [id number]    EKG EKG Interpretation  Date/Time:  Sunday July 30 2021 10:21:09 EST Ventricular Rate:  109 PR Interval:    QRS Duration: 82 QT Interval:  338 QTC Calculation: 456 R Axis:   89 Text Interpretation: Atrial fibrillation Ventricular premature complex Borderline right axis deviation Probable anteroseptal infarct, old Borderline repolarization abnormality Confirmed by Godfrey Pick [phone]) on 07/30/2021 10:56:54 AM  Radiology CT HEAD WO CONTRAST  Result Date: 07/30/2021 CLINICAL DATA:  Head trauma EXAM: CT HEAD WITHOUT CONTRAST TECHNIQUE: Contiguous axial images were obtained from the base of the skull through the vertex without intravenous contrast. COMPARISON:  None. FINDINGS: Brain: No acute intracranial hemorrhage, mass effect, or herniation. No extra-axial fluid collections. No evidence of acute territorial infarct. No hydrocephalus. Moderate cortical volume loss. Extensive hypodensities throughout the periventricular and subcortical white matter, likely secondary to advanced chronic microvascular ischemic changes. Small old infarct in the right frontal lobe. Old infarcts in the bilateral occipital lobes and old infarct in the right cerebellum. Vascular: Calcified plaques in the carotid siphons. Skull: Normal. Negative for fracture or focal lesion. Sinuses/Orbits: No acute finding. Mucous retention cyst or polyp in the  left maxillary sinus noted. Other: Right parietal scalp soft tissue swelling/hematoma. IMPRESSION: 1. No acute intracranial process identified. 2. Chronic changes including advanced chronic microvascular ischemic changes and multifocal old infarcts. 3.  Right parietal scalp swelling/hematoma. Electronically Signed   By: Ofilia Neas M.D.   On: 07/30/2021 13:30   CT CERVICAL SPINE WO CONTRAST  Result Date: 07/30/2021 CLINICAL DATA:  Neck trauma EXAM: CT CERVICAL SPINE WITHOUT CONTRAST TECHNIQUE: Multidetector CT imaging of the cervical spine was performed without intravenous contrast. Multiplanar CT image reconstructions were also generated. COMPARISON:  CT cervical spine 09/04/2015 FINDINGS: Alignment: Grade 1 anterolisthesis of C4 on C5. Skull base and vertebrae: No acute fracture. No primary bone lesion or focal pathologic process. Soft tissues and spinal canal: No prevertebral fluid or swelling. No visible canal hematoma. Disc levels: Moderate intervertebral disc space narrowing at C5-C6 and C6-C7. Multilevel uncovertebral spurring and facet arthropathy. Upper chest: No acute process identified. Other: None. IMPRESSION: Degenerative changes with no acute fracture or subluxation identified. Electronically Signed   By: Ofilia Neas M.D.   On: 07/30/2021 13:40   CT CHEST ABDOMEN PELVIS W CONTRAST  Result Date: 07/30/2021 CLINICAL DATA:  Fall today. Blunt trauma to chest and abdomen. Nausea and vomiting. Initial encounter. EXAM: CT CHEST, ABDOMEN, AND PELVIS WITH CONTRAST TECHNIQUE: Multidetector CT imaging of the chest, abdomen and pelvis was performed following the standard protocol during bolus administration of intravenous contrast. CONTRAST:  179mL OMNIPAQUE IOHEXOL 300 MG/ML  SOLN COMPARISON:  Chest CT on 04/09/2018, and [facility] CT on 11/07/2018 FINDINGS: CT CHEST FINDINGS Cardiovascular: No evidence of thoracic aortic injury or mediastinal hematoma. No pericardial effusion. Stable cardiomegaly. Aortic and coronary atherosclerotic calcification noted. Mediastinum/Nodes: No evidence of hemorrhage or pneumomediastinum. No masses or pathologically enlarged lymph nodes identified. Lungs/Pleura: No evidence of pulmonary contusion or other infiltrate.  No evidence of pneumothorax or hemothorax. Musculoskeletal: No acute fractures or suspicious bone lesions identified. CT ABDOMEN PELVIS FINDINGS Hepatobiliary: No hepatic laceration or mass identified. A few tiny cysts are again seen in the caudate and left hepatic lobes. Multiple calcified gallstones are again seen, however there is no evidence of cholecystitis or biliary ductal dilatation. Pancreas: No parenchymal laceration, mass, or inflammatory changes identified. Spleen: No evidence of splenic laceration. Adrenal/Urinary Tract: No hemorrhage or parenchymal lacerations identified. No evidence of mass or hydronephrosis. Mild left renal parenchymal scarring again noted. Unremarkable unopacified urinary bladder. Stomach/Bowel: Unopacified bowel loops are unremarkable in appearance. No evidence of hemoperitoneum. Diverticulosis is seen mainly involving the descending and sigmoid colon, however there is no evidence of diverticulitis. Vascular/Lymphatic: No evidence of abdominal aortic injury or retroperitoneal hemorrhage. Aortic atherosclerotic calcification noted. No pathologically enlarged lymph nodes identified. Reproductive: Prior hysterectomy noted. Adnexal regions are unremarkable in appearance. Other:  None. Musculoskeletal: No acute fractures or suspicious bone lesions identified. Stable bony exostosis arising from the greater trochanter of the left hip. IMPRESSION: No evidence of traumatic injury or other acute findings. Cholelithiasis. No radiographic evidence of cholecystitis. Colonic diverticulosis, without radiographic evidence of diverticulitis. Electronically Signed   By: Marlaine Hind M.D.   On: 07/30/2021 13:35   CT T-SPINE NO CHARGE  Result Date: 07/30/2021 CLINICAL DATA:  Fall. EXAM: CT THORACIC AND LUMBAR SPINE WITHOUT CONTRAST TECHNIQUE: Multidetector CT imaging of the thoracic and lumbar spine was performed without contrast. Multiplanar CT image reconstructions were also generated.  COMPARISON:  Chest CT of 04/09/2018. lumbar spine radiographs 10/01/2012. FINDINGS: CT THORACIC SPINE FINDINGS Alignment: Lower thoracic levocurvature. No significant spondylolisthesis. Vertebrae: No evidence  of acute fracture to the thoracic spine. Multilevel degenerate endplate irregularity with small Schmorl nodes. Multilevel bridging ventrolateral osteophytes. Additionally, there is suspected early osseous fusion across the disc spaces at T6-T7, T7-T8 and T8-T9. Paraspinal and other soft tissues: No paraspinal soft tissue abnormality. Please refer to the concurrently performed CT chest/abdomen/pelvis for description of intrathoracic soft tissue findings. Disc levels: Thoracic spondylosis with multilevel disc space narrowing. Multilevel bridging ventrolateral osteophytes and suspected early osseous fusion across the disc spaces, as described above. No appreciable high-grade spinal canal stenosis. No compressive bony neural foraminal narrowing. CT LUMBAR SPINE FINDINGS Segmentation: 5 lumbar vertebrae. The caudal most well-formed Duel disc space is designated L5-S1. Alignment: Lumbar dextrocurvature. 5 mm L4-L5 grade 1 anterolisthesis. Vertebrae: Lumbar vertebral body height is maintained. No evidence of acute fracture to the lumbar spine. Paraspinal and other soft tissues: No paraspinal soft tissue abnormality. Please refer to the concurrently performed CT chest/abdomen/pelvis for description of abdominopelvic soft tissue findings. Disc levels: Moderate disc space narrowing at L3-L4. No more than mild disc space narrowing at the remaining levels. Multilevel disc bulges, endplate spurring, facet arthrosis and ligamentum flavum hypertrophy. Multilevel spinal canal stenosis. Most notably, there is bilateral subarticular stenosis and suspected severe central canal stenosis at L3-L4 and L4-L5. Multilevel neural foraminal narrowing, greatest on the left at L2-L3, bilaterally at L3-L4 and on the left at L4-L5.  IMPRESSION: CT THORACIC SPINE IMPRESSION 1. No evidence of acute fracture to the thoracic spine. 2. Lower thoracic levocurvature. 3. Thoracic spondylosis with multilevel fusion, as described. 4. Please refer to the concurrently performed CT chest/abdomen/pelvis for description of intrathoracic soft tissue findings. CT LUMBAR SPINE IMPRESSION 1. No evidence of acute fracture to the lumbar spine. 2. Lumbar dextrocurvature. 3. 5 mm L4-L5 facet mediated grade 1 anterolisthesis. 4. Lumbar spondylosis, as outlined. Notably, there is probable severe spinal canal stenosis at L3-L4 and L4-L5. Multilevel neural foraminal narrowing, greatest on the left at L2-L3, bilaterally at L3-L4 and on the left at L4-L5. 5. Please refer to the concurrently performed CT chest/abdomen/pelvis for description of abdominopelvic soft tissue findings. Electronically Signed   By: Kellie Simmering D.O.   On: 07/30/2021 13:10   CT L-SPINE NO CHARGE  Result Date: 07/30/2021 CLINICAL DATA:  Fall. EXAM: CT THORACIC AND LUMBAR SPINE WITHOUT CONTRAST TECHNIQUE: Multidetector CT imaging of the thoracic and lumbar spine was performed without contrast. Multiplanar CT image reconstructions were also generated. COMPARISON:  Chest CT of 04/09/2018. lumbar spine radiographs 10/01/2012. FINDINGS: CT THORACIC SPINE FINDINGS Alignment: Lower thoracic levocurvature. No significant spondylolisthesis. Vertebrae: No evidence of acute fracture to the thoracic spine. Multilevel degenerate endplate irregularity with small Schmorl nodes. Multilevel bridging ventrolateral osteophytes. Additionally, there is suspected early osseous fusion across the disc spaces at T6-T7, T7-T8 and T8-T9. Paraspinal and other soft tissues: No paraspinal soft tissue abnormality. Please refer to the concurrently performed CT chest/abdomen/pelvis for description of intrathoracic soft tissue findings. Disc levels: Thoracic spondylosis with multilevel disc space narrowing. Multilevel bridging  ventrolateral osteophytes and suspected early osseous fusion across the disc spaces, as described above. No appreciable high-grade spinal canal stenosis. No compressive bony neural foraminal narrowing. CT LUMBAR SPINE FINDINGS Segmentation: 5 lumbar vertebrae. The caudal most well-formed Duel disc space is designated L5-S1. Alignment: Lumbar dextrocurvature. 5 mm L4-L5 grade 1 anterolisthesis. Vertebrae: Lumbar vertebral body height is maintained. No evidence of acute fracture to the lumbar spine. Paraspinal and other soft tissues: No paraspinal soft tissue abnormality. Please refer to the concurrently performed CT chest/abdomen/pelvis for description  of abdominopelvic soft tissue findings. Disc levels: Moderate disc space narrowing at L3-L4. No more than mild disc space narrowing at the remaining levels. Multilevel disc bulges, endplate spurring, facet arthrosis and ligamentum flavum hypertrophy. Multilevel spinal canal stenosis. Most notably, there is bilateral subarticular stenosis and suspected severe central canal stenosis at L3-L4 and L4-L5. Multilevel neural foraminal narrowing, greatest on the left at L2-L3, bilaterally at L3-L4 and on the left at L4-L5. IMPRESSION: CT THORACIC SPINE IMPRESSION 1. No evidence of acute fracture to the thoracic spine. 2. Lower thoracic levocurvature. 3. Thoracic spondylosis with multilevel fusion, as described. 4. Please refer to the concurrently performed CT chest/abdomen/pelvis for description of intrathoracic soft tissue findings. CT LUMBAR SPINE IMPRESSION 1. No evidence of acute fracture to the lumbar spine. 2. Lumbar dextrocurvature. 3. 5 mm L4-L5 facet mediated grade 1 anterolisthesis. 4. Lumbar spondylosis, as outlined. Notably, there is probable severe spinal canal stenosis at L3-L4 and L4-L5. Multilevel neural foraminal narrowing, greatest on the left at L2-L3, bilaterally at L3-L4 and on the left at L4-L5. 5. Please refer to the concurrently performed CT  chest/abdomen/pelvis for description of abdominopelvic soft tissue findings. Electronically Signed   By: Kellie Simmering D.O.   On: 07/30/2021 13:10   DG Chest Port 1 View  Result Date: 07/30/2021 CLINICAL DATA:  Fall trauma EXAM: PORTABLE CHEST 1 VIEW COMPARISON:  Chest x-ray 12/05/2016 FINDINGS: Cardiomegaly and mediastinum appear unchanged. Calcified plaques in the thoracic aorta. Pulmonary vasculature within normal limits. No focal consolidation identified. No significant pleural effusion. No pneumothorax. No acute osseous abnormality identified. IMPRESSION: Cardiomegaly with no acute intrathoracic process identified. Electronically Signed   By: Ofilia Neas M.D.   On: 07/30/2021 11:21    Procedures Procedures   Medications Ordered in ED Medications  iohexol (OMNIPAQUE) 300 MG/ML solution 100 mL (100 mLs Intravenous Contrast Given 07/30/21 1203)  lactated ringers bolus 500 mL (0 mLs Intravenous Stopped 07/30/21 1720)    ED Course  I have reviewed the triage vital signs and the nursing notes.  Pertinent labs & imaging results that were available during my care of the patient were reviewed by me and considered in my medical decision making (see chart for details).    MDM Rules/Calculators/A&P                          Patient presents after a witnessed fall at home.  Fall was described as mechanical as patient was being helped out of the bed and slipped.  On arrival, she is awake.  She has dementia and is reportedly at her mental baseline.  Given her use of blood thinners, she arrives as a level 2 trauma.  Physical exam is notable for a hematoma on the right parietal scalp.  She has a very superficial abrasion to her right knee without any evidence of underlying swelling or tenderness.  No other areas of swelling, tenderness, or deformity appreciated on exam.  Patient underwent trauma work-up which showed no acute injuries.  On reassessment, family now at bedside.  Patient has not had any  further vomiting in the ED.  Family does feel comfortable bringing her home.  Patient underwent a trial of standing and ambulation.  During this trial, she did become dizzy.  Her family feels that this is likely from not eating or drinking since last night.  Patient was given IV fluids and a snack.  Plan will be for discharge following reassessment.  Care of patient signed out  oncoming ED provider.  Final Clinical Impression(s) / ED Diagnoses Final diagnoses:  Fall    Rx / DC Orders ED Discharge Orders     None        Godfrey Pick, MD 07/31/21 1044

## 2021-07-30 NOTE — ED Notes (Addendum)
Trauma called at [phone]

## 2021-07-30 NOTE — Progress Notes (Signed)
Orthopedic Tech Progress Note Patient Details:  Kari [name] 15-Jun-1934 [id number]  Patient ID: Kari [name], female   DOB: [birth year], 87 y.o.   MRN: [id number] Level II, not needed.  Kari [name] 07/30/2021, 10:40 AM

## 2021-07-30 NOTE — ED Notes (Signed)
Trauma activation at [phone] Radiology and ortho tech arrived at [place] and security arrived at [phone]

## 2021-07-30 NOTE — ED Notes (Signed)
Pt ambulated with walker with an unsteady gait. Was assisted by this Probation officer and her daughter. Pt was stumbling and unbalanced. Pt stated that she was feeling dizzy and this writer put the patient back in bed.

## 2021-07-30 NOTE — Progress Notes (Signed)
   07/30/21 1036  Clinical Encounter Type  Visited With Patient and family together  Visit Type Initial;ED;Trauma   [facility] paged for Level II trauma; when [facility] arrived, pt. lying in bed w/son and dtr. at bedside.  Son is a Theme park manager and requested prayer; pt. fell this AM and hit her head; pt.'s husband is also in the hospital and is due for discharge soon. Family hopes pt.'s injuries are minor and that she can be discharged later today.  No further needs at this time but family grateful for visit.

## 2021-07-30 NOTE — ED Triage Notes (Signed)
Pt arrives via EMS from home as activated level II for witnessed fall on blood thinners. Pts son reports that pt slipped out of bed this am and hit her head on a dresser. Pt takes eliquis. No LOC. Hematoma to right side of head. C Collar in place. Hx of dementia.

## 2021-08-08 ENCOUNTER — Encounter: Payer: Self-pay | Admitting: Family Medicine

## 2021-08-08 ENCOUNTER — Ambulatory Visit (INDEPENDENT_AMBULATORY_CARE_PROVIDER_SITE_OTHER): Payer: Medicare Other | Admitting: Family Medicine

## 2021-08-08 VITALS — BP 148/86 | HR 89 | Temp 97.7°F | Ht 61.5 in | Wt 156.0 lb

## 2021-08-08 DIAGNOSIS — S80211D Abrasion, right knee, subsequent encounter: Secondary | ICD-10-CM | POA: Diagnosis not present

## 2021-08-08 DIAGNOSIS — S0003XD Contusion of scalp, subsequent encounter: Secondary | ICD-10-CM

## 2021-08-08 DIAGNOSIS — W19XXXD Unspecified fall, subsequent encounter: Secondary | ICD-10-CM

## 2021-08-08 NOTE — Progress Notes (Signed)
Subjective:  Patient ID: Kari [name], female    DOB: Sep 14, 1933, 87 y.o.   MRN: [id number]  Patient Care Team: [name], DO as PCP - General (Family Medicine) Minus Breeding, MD as Consulting Physician (Cardiology) Gatha Mayer, MD as Consulting Physician (Gastroenterology) Penni Bombard, MD as Consulting Physician (Neurology)   Chief Complaint:  Follow-up (Head, no concussion, no bleeding in the brain)   HPI: Kari [name] is a 87 y.o. female presenting on 08/08/2021 for Follow-up (Head, no concussion, no bleeding in the brain)   Patient presents today for follow-up after recent ED visit for fall.  She was sitting on the corner of her bed and slipped hitting her head on the nightstand on 11/27/202  No LOC.  She is on Eliquis so family took her to the emergency room for evaluation. She had CTs of head, C-Spine, thoracic spine, lumbar spine, and abdomen/pelvis which were all negative. CXR was negative for acute findings. EKG and labs unremarkable. She was discharged home. Family states she has been doing well since discharge and has not had any repeat falls. She has baseline dementia but denies any pain today.  Family member denies any worsening confusion.   Relevant past medical, surgical, family, and social history reviewed and updated as indicated.  Allergies and medications reviewed and updated. Data reviewed: Chart in Epic.   Past Medical History:  Diagnosis Date   Adenomatous polyps    AF (atrial fibrillation) (HCC)    Cataract    Gallstones    Hx of adenomatous colonic polyps    Hyperlipidemia    x5 years   Hypertension    x5 years   Kyphosis    Metabolic syndrome X    Obesity    Osteoporosis    Stroke ([place]) 07/2017   Subdural hematoma    Symptomatic menopausal or female climacteric states    Vitamin D deficiency     Past Surgical History:  Procedure Laterality Date   ABDOMINAL HYSTERECTOMY     BLADDER SUSPENSION     COLONOSCOPY   multiple   ESOPHAGEAL DILATION N/A 03/03/2018   Procedure: ESOPHAGEAL DILATION;  Surgeon: Rogene Houston, MD;  Location: [facility];  Service: Endoscopy;  Laterality: N/A;   ESOPHAGOGASTRODUODENOSCOPY N/A 03/03/2018   Procedure: ESOPHAGOGASTRODUODENOSCOPY (EGD);  Surgeon: Rogene Houston, MD;  Location: [facility];  Service: Endoscopy;  Laterality: N/A;   EYE SURGERY Bilateral 09/2015   VAGINAL HYSTERECTOMY     total    Social History   Socioeconomic History   Marital status: Married    Spouse name: Ollen Gross    Number of children: 2   Years of education: Not on file   Highest education level: Not on file  Occupational History   Occupation: Psychologist, sport and exercise  Tobacco Use   Smoking status: Never   Smokeless tobacco: Never  Vaping Use   Vaping Use: Never used  Substance and Sexual Activity   Alcohol use: No   Drug use: No   Sexual activity: Not on file  Other Topics Concern   Not on file  Social History Narrative   Lives with her husband and son, independent   1 son Surveyor, minerals)   1 Daughter Contractor)   2 grandchildren and 3 great-grandchild   12th grade education   Social Determinants of Health   Financial Resource Strain: Not on file  Food Insecurity: Not on file  Transportation Needs: Not on file  Physical Activity: Not on file  Stress: Not on file  Social Connections: Not on file  Intimate Partner Violence: Not on file    Outpatient Encounter Medications as of 08/08/2021  Medication Sig   apixaban (ELIQUIS) 5 MG TABS tablet Take 1 tablet (5 mg total) by mouth 2 (two) times daily.   atenolol (TENORMIN) 50 MG tablet TAKE 1 TABLET DAILY (Patient taking differently: Take 50 mg by mouth daily.)   Benfotiamine 150 MG CAPS Take 150 mg by mouth daily.   captopril (CAPOTEN) 50 MG tablet TAKE 1 TABLET BY MOUTH 2 TIMES DAILY FOR HIGH BLOOD PRESSURE. (Patient taking differently: Take 50 mg by mouth 2 (two) times daily.)   ezetimibe (ZETIA) 10 MG tablet TAKE 1 TABLET ONCE  DAILY FOR CHOLESTEROL (Patient taking differently: Take 10 mg by mouth daily.)   fluconazole (DIFLUCAN) 150 MG tablet Take one tablet by mouth now. Repeat dose in 7 days.   loratadine (CLARITIN) 10 MG tablet Take 1 tablet (10 mg total) by mouth daily.   Multiple Vitamins-Minerals (ZINC PO) Take by mouth.   potassium chloride (KLOR-CON) 10 MEQ tablet TAKE 1 TABLET DAILY (Patient taking differently: Take 10 mEq by mouth daily.)   pravastatin (PRAVACHOL) 80 MG tablet TAKE 1 TABLET ONCE DAILY FOR CHOLESTEROL (Patient taking differently: Take 80 mg by mouth daily.)   rivastigmine (EXELON) 4.6 mg/24hr Place 1 patch (4.6 mg total) onto the skin daily.   vitamin C (ASCORBIC ACID) 500 MG tablet Take 500 mg by mouth daily.   No facility-administered encounter medications on file as of 08/08/2021.    Allergies  Allergen Reactions   Erythromycin Other (See Comments)    Reaction unknown   Penicillins Other (See Comments)    Reaction unknown  Has patient had a PCN reaction causing immediate rash, facial/tongue/throat swelling, SOB or lightheadedness with hypotension: unknown Has patient had a PCN reaction causing severe rash involving mucus membranes or skin necrosis: unknown Has patient had a PCN reaction that required hospitalization : unknown Has patient had a PCN reaction occurring within the last 10 years: no If all of the above answers are "NO", then may proceed with Cephalosporin use.     Review of Systems  Unable to perform ROS: Dementia       Objective:  BP (!) 158/86   Pulse 89   Temp 97.7 F (36.5 C)   Ht 5' 1.5" (1.562 m)   Wt 156 lb (70.8 kg)   SpO2 93%   BMI 29.00 kg/m    Wt Readings from Last 3 Encounters:  08/08/21 156 lb (70.8 kg)  07/30/21 157 lb 6.5 oz (71.4 kg)  06/29/21 157 lb 8 oz (71.4 kg)    Physical Exam Vitals and nursing note reviewed.  Constitutional:      General: She is not in acute distress.    Appearance: Normal appearance. She is obese. She is not  ill-appearing or toxic-appearing.  HENT:     Head: Normocephalic. Contusion present. No raccoon eyes, Battle's sign, abrasion, masses, right periorbital erythema, left periorbital erythema or laceration. Hair is normal.     Jaw: There is normal jaw occlusion.      Nose: Nose normal.     Mouth/Throat:     Mouth: Mucous membranes are moist.     Pharynx: Oropharynx is clear.  Eyes:     Conjunctiva/sclera: Conjunctivae normal.     Pupils: Pupils are equal, round, and reactive to light.  Cardiovascular:     Rate and Rhythm: Normal rate. Rhythm regularly irregular.  Heart sounds: Normal heart sounds.  Pulmonary:     Effort: Pulmonary effort is normal.     Breath sounds: Normal breath sounds.  Abdominal:     General: Bowel sounds are normal.     Palpations: Abdomen is soft.  Musculoskeletal:     Cervical back: Normal range of motion and neck supple.     Right lower leg: No edema.     Left lower leg: No edema.  Skin:    General: Skin is warm and dry.     Capillary Refill: Capillary refill takes less than 2 seconds.     Findings: Abrasion and wound present.       Neurological:     General: No focal deficit present.     Mental Status: She is alert. Mental status is at baseline.  Psychiatric:        Attention and Perception: Attention normal.        Mood and Affect: Mood and affect normal.        Speech: Speech normal.        Behavior: Behavior normal. Behavior is cooperative.        Thought Content: Thought content normal.        Cognition and Memory: Cognition is impaired. Memory is impaired.    Results for orders placed or performed during the hospital encounter of 07/30/21  Resp Panel by RT-PCR (Flu A&B, Covid) Nasopharyngeal Swab   Specimen: Nasopharyngeal Swab; Nasopharyngeal(NP) swabs in vial transport medium  Result Value Ref Range   SARS Coronavirus 2 by RT PCR NEGATIVE NEGATIVE   Influenza A by PCR NEGATIVE NEGATIVE   Influenza B by PCR NEGATIVE NEGATIVE   Comprehensive metabolic panel  Result Value Ref Range   Sodium 136 135 - 145 mmol/L   Potassium 3.6 3.5 - 5.1 mmol/L   Chloride 102 98 - 111 mmol/L   CO2 23 22 - 32 mmol/L   Glucose, Bld 151 (H) 70 - 99 mg/dL   BUN 6 (L) 8 - 23 mg/dL   Creatinine, Ser 0.63 0.44 - 1.00 mg/dL   Calcium 9.3 8.9 - 10.3 mg/dL   Total Protein 7.3 6.5 - 8.1 g/dL   Albumin 3.8 3.5 - 5.0 g/dL   AST 28 15 - 41 U/L   ALT 17 0 - 44 U/L   Alkaline Phosphatase 69 38 - 126 U/L   Total Bilirubin 1.0 0.3 - 1.2 mg/dL   GFR, Estimated >60 >60 mL/min   Anion gap 11 5 - 15  CBC  Result Value Ref Range   WBC 11.4 (H) 4.0 - 10.5 K/uL   RBC 5.30 (H) 3.87 - 5.11 MIL/uL   Hemoglobin 15.5 (H) 12.0 - 15.0 g/dL   HCT 50.5 (H) 36.0 - 46.0 %   MCV 95.3 80.0 - 100.0 fL   MCH 29.2 26.0 - 34.0 pg   MCHC 30.7 30.0 - 36.0 g/dL   RDW 14.3 11.5 - 15.5 %   Platelets 219 150 - 400 K/uL   nRBC 0.0 0.0 - 0.2 %  Urinalysis, Routine w reflex microscopic Urine, Clean Catch  Result Value Ref Range   Color, Urine YELLOW YELLOW   APPearance CLOUDY (A) CLEAR   Specific Gravity, Urine 1.024 1.005 - 1.030   pH 8.0 5.0 - 8.0   Glucose, UA NEGATIVE NEGATIVE mg/dL   Hgb urine dipstick NEGATIVE NEGATIVE   Bilirubin Urine NEGATIVE NEGATIVE   Ketones, ur 5 (A) NEGATIVE mg/dL   Protein, ur 30 (A) NEGATIVE mg/dL  Nitrite NEGATIVE NEGATIVE   Leukocytes,Ua NEGATIVE NEGATIVE   RBC / HPF 0-5 0 - 5 RBC/hpf   WBC, UA 0-5 0 - 5 WBC/hpf   Bacteria, UA NONE SEEN NONE SEEN   Squamous Epithelial / LPF 0-5 0 - 5  Protime-INR  Result Value Ref Range   Prothrombin Time 15.7 (H) 11.4 - 15.2 seconds   INR 1.3 (H) 0.8 - 1.2       Pertinent labs & imaging results that were available during my care of the patient were reviewed by me and considered in my medical decision making.  Assessment & Plan:  Jermaine was seen today for follow-up.  Diagnoses and all orders for this visit:  Fall, subsequent encounter Contusion of scalp, subsequent  encounter Abrasion of right knee, subsequent encounter Doing well since discharge from ED for fall on 07/30/2021. Has small contusion to right parietal scalp, no erythema or tenderness. She has an abrasion to right knee which appears to be healing well. Fall preventions in the home discussed in detail. Follow up with PCP as scheduled for management of chronic medical conditions.     Continue all other maintenance medications.  Follow up plan: Return if symptoms worsen or fail to improve.   Continue healthy lifestyle choices, including diet (rich in fruits, vegetables, and lean proteins, and low in salt and simple carbohydrates) and exercise (at least 30 minutes of moderate physical activity daily).  Educational handout given for fall prevention  The above assessment and management plan was discussed with the patient. The patient verbalized understanding of and has agreed to the management plan. Patient is aware to call the clinic if they develop any new symptoms or if symptoms persist or worsen. Patient is aware when to return to the clinic for a follow-up visit. Patient educated on when it is appropriate to go to the emergency department.   Monia Pouch, FNP-C [facility] [phone]

## 2021-08-30 ENCOUNTER — Ambulatory Visit (INDEPENDENT_AMBULATORY_CARE_PROVIDER_SITE_OTHER): Payer: Medicare Other

## 2021-08-30 ENCOUNTER — Telehealth: Payer: Self-pay

## 2021-08-30 VITALS — Ht 62.0 in | Wt 156.0 lb

## 2021-08-30 DIAGNOSIS — Z0000 Encounter for general adult medical examination without abnormal findings: Secondary | ICD-10-CM

## 2021-08-30 NOTE — Progress Notes (Signed)
Subjective:   Kari [name] is a 87 y.o. female who presents for Medicare Annual (Subsequent) preventive examination. Virtual Visit via Telephone Note  I connected with  Kari [name] on 08/30/21 at  1:15 PM EST by telephone and verified that I am speaking with the correct person using two identifiers.  Location: Patient: HOME Provider: [facility] Persons participating in the virtual visit: patient/Nurse Health Advisor   I discussed the limitations, risks, security and privacy concerns of performing an evaluation and management service by telephone and the availability of in person appointments. The patient expressed understanding and agreed to proceed.  Interactive audio and video telecommunications were attempted between this nurse and patient, however failed, due to patient having technical difficulties OR patient did not have access to video capability.  We continued and completed visit with audio only.  Some vital signs may be absent or patient reported.   Kari Driver, LPN  Review of Systems     Cardiac Risk Factors include: advanced age (>48men, >38 women);hypertension;dyslipidemia;sedentary lifestyle     Objective:    Today's Vitals   08/30/21 1306  Weight: 156 lb (70.8 kg)  Height: 5\' 2"  (1.575 m)   Body mass index is 28.53 kg/m.  Advanced Directives 08/30/2021 07/30/2021 08/29/2020 08/24/2019 11/10/2018 07/01/2018 03/03/2018  Does Patient Have a Medical Advance Directive? No Unable to assess, patient is non-responsive or altered mental status No Yes No No No  Type of Advance Directive - - - Living will - - -  Does patient want to make changes to medical advance directive? - - - No - Patient declined - - -  Copy of [place] in [place]  Would patient like information on creating a medical advance directive? No - Patient declined - No - Patient declined - No - Patient declined Yes (MAU/Ambulatory/Procedural Areas - Information given) No  - Patient declined  Pre-existing out of facility DNR order (yellow form or pink MOST form) - - - - - - -    Current Medications (verified) Outpatient Encounter Medications as of 08/30/2021  Medication Sig   apixaban (ELIQUIS) 5 MG TABS tablet Take 1 tablet (5 mg total) by mouth 2 (two) times daily.   atenolol (TENORMIN) 50 MG tablet TAKE 1 TABLET DAILY (Patient taking differently: Take 50 mg by mouth daily.)   Benfotiamine 150 MG CAPS Take 150 mg by mouth daily.   captopril (CAPOTEN) 50 MG tablet TAKE 1 TABLET BY MOUTH 2 TIMES DAILY FOR HIGH BLOOD PRESSURE. (Patient taking differently: Take 50 mg by mouth 2 (two) times daily.)   ezetimibe (ZETIA) 10 MG tablet TAKE 1 TABLET ONCE DAILY FOR CHOLESTEROL (Patient taking differently: Take 10 mg by mouth daily.)   loratadine (CLARITIN) 10 MG tablet Take 1 tablet (10 mg total) by mouth daily.   Multiple Vitamins-Minerals (ZINC PO) Take by mouth.   potassium chloride (KLOR-CON) 10 MEQ tablet TAKE 1 TABLET DAILY (Patient taking differently: Take 10 mEq by mouth daily.)   pravastatin (PRAVACHOL) 80 MG tablet TAKE 1 TABLET ONCE DAILY FOR CHOLESTEROL (Patient taking differently: Take 80 mg by mouth daily.)   rivastigmine (EXELON) 4.6 mg/24hr Place 1 patch (4.6 mg total) onto the skin daily.   vitamin C (ASCORBIC ACID) 500 MG tablet Take 500 mg by mouth daily.   fluconazole (DIFLUCAN) 150 MG tablet Take one tablet by mouth now. Repeat dose in 7 days. (Patient not taking: Reported on 08/30/2021)  No facility-administered encounter medications on file as of 08/30/2021.    Allergies (verified) Erythromycin and Penicillins   History: Past Medical History:  Diagnosis Date   Adenomatous polyps    AF (atrial fibrillation) (HCC)    Cataract    Gallstones    Hx of adenomatous colonic polyps    Hyperlipidemia    x5 years   Hypertension    x5 years   Kyphosis    Metabolic syndrome X    Obesity    Osteoporosis    Stroke ([place]) 07/2017   Subdural  hematoma    Symptomatic menopausal or female climacteric states    Vitamin D deficiency    Past Surgical History:  Procedure Laterality Date   ABDOMINAL HYSTERECTOMY     BLADDER SUSPENSION     COLONOSCOPY  multiple   ESOPHAGEAL DILATION N/A 03/03/2018   Procedure: ESOPHAGEAL DILATION;  Surgeon: Rogene Houston, MD;  [facility];  Service: Endoscopy;  Laterality: N/A;   ESOPHAGOGASTRODUODENOSCOPY N/A 03/03/2018   Procedure: ESOPHAGOGASTRODUODENOSCOPY (EGD);  Surgeon: Rogene Houston, MD;  Location: [facility];  Service: Endoscopy;  Laterality: N/A;   EYE SURGERY Bilateral 09/2015   VAGINAL HYSTERECTOMY     total   Family History  Problem Relation Age of Onset   Heart attack Mother    Stroke Mother    Heart attack Father    Alcohol abuse Father    Hypertension Sister    Stroke Sister    Stroke Brother    Hypertension Brother    Heart attack Brother    Hypertension Brother    Depression Brother        suicide   Hypertension Sister    Heart attack Other    Stroke Other    Breast cancer Other    Colon cancer Neg Hx    Social History   Socioeconomic History   Marital status: Married    Spouse name: Kari [name]    Number of children: 2   Years of education: Not on file   Highest education level: Not on file  Occupational History   Occupation: Psychologist, sport and exercise  Tobacco Use   Smoking status: Never   Smokeless tobacco: Never  Vaping Use   Vaping Use: Never used  Substance and Sexual Activity   Alcohol use: No   Drug use: No   Sexual activity: Not on file  Other Topics Concern   Not on file  Social History Narrative   Lives with her husband and son, independent   1 son Surveyor, minerals)   1 Daughter Contractor)   2 grandchildren and 3 great-grandchild   12th grade education   Social Determinants of Health   Financial Resource Strain: Low Risk    Difficulty of Paying Living Expenses: Not hard at all  Food Insecurity: No Food Insecurity   Worried About Paediatric nurse in the Last Year: Never true   Arboriculturist in the Last Year: Never true  Transportation Needs: No Transportation Needs   Lack of Transportation (Medical): No   Lack of Transportation (Non-Medical): No  Physical Activity: Insufficiently Active   Days of Exercise per Week: 3 days   Minutes of Exercise per Session: 30 min  Stress: No Stress Concern Present   Feeling of Stress : Not at all  Social Connections: Moderately Integrated   Frequency of Communication with Friends and Family: More than three times a week   Frequency of Social Gatherings with Friends and Family: More than  three times a week   Attends Religious Services: More than 4 times per year   Active Member of Clubs or Organizations: Yes   Attends Archivist Meetings: More than 4 times per year   Marital Status: Widowed    Tobacco Counseling Counseling given: Not Answered   Clinical Intake:  Pre-visit preparation completed: Yes  Pain : No/denies pain     BMI - recorded: 28.53 Nutritional Status: BMI 25 -29 Overweight Nutritional Risks: None Diabetes: No  How often do you need to have someone help you when you read instructions, pamphlets, or other written materials from your doctor or pharmacy?: 1 - Never  Diabetic?NO  Interpreter Needed?: No  Information entered by :: MJ Kewana Sanon, LPN   Activities of Daily Living In your present state of health, do you have any difficulty performing the following activities: 08/30/2021  Hearing? N  Vision? N  Difficulty concentrating or making decisions? Y  Comment No short term memory per pt's son, Luciana Axe.  Walking or climbing stairs? Y  Dressing or bathing? Y  Doing errands, shopping? Y  Preparing Food and eating ? Y  Comment Feeds self at times, per pt's son.  Using the Toilet? Y  In the past six months, have you accidently leaked urine? Y  Do you have problems with loss of bowel control? Y  Managing your Medications? Y  Managing your  Finances? Y  Housekeeping or managing your Housekeeping? Y  Some recent data might be hidden    Patient Care Team: [name], DO as PCP - General (Family Medicine) Minus Breeding, MD as Consulting Physician (Cardiology) Gatha Mayer, MD as Consulting Physician (Gastroenterology) Penni Bombard, MD as Consulting Physician (Neurology)  Indicate any recent Medical Services you may have received from other than [facility] providers in the past year (date may be approximate).     Assessment:   This is a routine wellness examination for Linzie.  Hearing/Vision screen Hearing Screening - Comments:: No hearing issues.  Vision Screening - Comments:: Readers. Saw [facility] in [place], 12/2020.  Dietary issues and exercise activities discussed: Current Exercise Habits: Home exercise routine, Type of exercise: walking, Time (Minutes): 20, Frequency (Times/Week): 3, Weekly Exercise (Minutes/Week): 60, Intensity: Mild, Exercise limited by: cardiac condition(s)   Goals Addressed             This Visit's Progress    DIET - INCREASE WATER INTAKE   On track    Drink at least 48 -64 oz of water per day.        Depression Screen PHQ 2/9 Scores 08/30/2021 08/08/2021 06/01/2021 05/09/2021 11/15/2020 08/29/2020 06/07/2020  PHQ - 2 Score 0 0 0 0 0 0 0  PHQ- 9 Score 0 4 - - - - 4    Fall Risk Fall Risk  08/30/2021 06/01/2021 05/09/2021 11/15/2020 08/29/2020  Falls in the past year? 1 0 0 0 0  Number falls in past yr: 0 - - - -  Comment - - - - -  Injury with Fall? 1 - - - -  Comment - - - - -  Risk Factor Category  - - - - -  Risk for fall due to : Impaired balance/gait;Impaired mobility;History of fall(s) - - - -  Risk for fall due to: Comment - - - - -  Follow up Falls prevention discussed - - - -    FALL RISK PREVENTION PERTAINING TO THE HOME:  Any stairs in or around the home?  Yes  If so, are there any without handrails? No  Home free of loose throw rugs in walkways, pet  beds, electrical cords, etc? Yes  Adequate lighting in your home to reduce risk of falls? Yes   ASSISTIVE DEVICES UTILIZED TO PREVENT FALLS:  Life alert? No  Use of a cane, walker or w/c? Yes  Grab bars in the bathroom? Yes  Shower chair or bench in shower? Yes  Elevated toilet seat or a handicapped toilet? Yes   TIMED UP AND GO:  Was the test performed? No .  Phone visit.  Cognitive Function: MMSE - Mini Mental State Exam 06/07/2020 08/18/2019 05/18/2019 07/01/2018 02/27/2017  Orientation to time 0 4 3 4 5   Orientation to Place 2 4 4 4 5   Registration 3 3 3 3 3   Attention/ Calculation 1 4 5 5 5   Recall 0 0 0 0 2  Language- name 2 objects 2 2 2 2 2   Language- repeat 1 0 1 1 1   Language- follow 3 step command 2 3 2 3 3   Language- read & follow direction 0 1 1 1 1   Write a sentence 1 1 1 1 1   Copy design 0 0 1 0 1  Total score 12 22 23 24 29      6CIT Screen 08/29/2020 08/24/2019  What Year? 4 points 0 points  What month? 0 points 0 points  What time? 0 points 0 points  Count back from 20 2 points 0 points  Months in reverse 4 points 2 points  Repeat phrase 10 points 4 points  Total Score 20 6    Immunizations Immunization History  Administered Date(s) Administered   Fluad Quad(high Dose 65+) 06/07/2020, 06/01/2021   Influenza Inj Mdck Quad Pf 05/18/2019   Influenza Split 06/18/2015   Influenza Whole 05/04/2010   Influenza, High Dose Seasonal PF 05/25/2016, 05/25/2016, 06/03/2017, 06/03/2017, 07/01/2018, 07/01/2018   Influenza,inj,Quad PF,6+ Mos 05/28/2013, 06/21/2014, 06/08/2015   Influenza-Unspecified 06/03/2017, 05/18/2019   Moderna Sars-Covid-2 Vaccination 09/16/2019, 10/14/2019, 06/27/2020, 03/15/2021   Pneumococcal Conjugate-13 01/06/2014   Pneumococcal Polysaccharide-23 10/05/1999, 02/02/2015   Td 10/05/2007   Tdap 05/05/2011    TDAP status: Due, Education has been provided regarding the importance of this vaccine. Advised may receive this vaccine at local  pharmacy or Health Dept. Aware to provide a copy of the vaccination record if obtained from local pharmacy or Health Dept. Verbalized acceptance and understanding.  Flu Vaccine status: Up to date  Pneumococcal vaccine status: Up to date  Covid-19 vaccine status: Completed vaccines  Qualifies for Shingles Vaccine? Yes   Zostavax completed No   Shingrix Completed?: No.    Education has been provided regarding the importance of this vaccine. Patient has been advised to call insurance company to determine out of pocket expense if they have not yet received this vaccine. Advised may also receive vaccine at local pharmacy or Health Dept. Verbalized acceptance and understanding.  Screening Tests Health Maintenance  Topic Date Due   Zoster Vaccines- Shingrix (1 of 2) Never done   MAMMOGRAM  05/04/2014   DEXA SCAN  04/04/2019   TETANUS/TDAP  05/04/2021   COVID-19 Vaccine (5 - Booster for Moderna series) 05/10/2021   Pneumonia Vaccine 61+ Years old  Completed   INFLUENZA VACCINE  Completed   HPV VACCINES  Aged Out   COLONOSCOPY (Pts 45-49yrs Insurance coverage will need to be confirmed)  Discontinued    Health Maintenance  Health Maintenance Due  Topic Date Due   Zoster Vaccines-  Shingrix (1 of 2) Never done   MAMMOGRAM  05/04/2014   DEXA SCAN  04/04/2019   TETANUS/TDAP  05/04/2021   COVID-19 Vaccine (5 - Booster for Moderna series) 05/10/2021    Colorectal cancer screening: No longer required.   Mammogram status: No longer required due to age.  Bone Density status: Completed 04/03/2017. Results reflect: Bone density results: OSTEOPOROSIS. Repeat every No longer required due to age years.  Lung Cancer Screening: (Low Dose CT Chest recommended if Age 30-80 years, 30 pack-year currently smoking OR have quit w/in 15years.) does not qualify.    Additional Screening:  Hepatitis C Screening: does not qualify.  Vision Screening: Recommended annual ophthalmology exams for early  detection of glaucoma and other disorders of the eye. Is the patient up to date with their annual eye exam?  Yes  Who is the provider or what is the name of the office in which the patient attends annual eye exams? Optometrist is [facility] [place]  If pt is not established with a provider, would they like to be referred to a provider to establish care? No .   Dental Screening: Recommended annual dental exams for proper oral hygiene  Community Resource Referral / Chronic Care Management: CRR required this visit?  No   CCM required this visit?  No      Plan:     I have personally reviewed and noted the following in the patients chart:   Medical and social history Use of alcohol, tobacco or illicit drugs  Current medications and supplements including opioid prescriptions.  Functional ability and status Nutritional status Physical activity Advanced directives List of other physicians Hospitalizations, surgeries, and ER visits in previous 12 months Vitals Screenings to include cognitive, depression, and falls Referrals and appointments  In addition, I have reviewed and discussed with patient certain preventive protocols, quality metrics, and best practice recommendations. A written personalized care plan for preventive services as well as general preventive health recommendations were provided to patient.     Kari Driver, LPN   56/21/3086   Nurse Notes: Visit completed with patient and patient's son, Luciana Axe, due to pt's dementia. Pt is up to date on all age appropriate vaccines and health maintenance. Discussed Shingrix and how to obtain. Pt's son states he would like to discuss further with Dr. Lajuana Ripple.  PHONE VISIT. PT AT HOME. NURSE AT [facility].

## 2021-08-30 NOTE — Telephone Encounter (Signed)
Attempted to reach son at both numbers that are on file for patient but there was no answer. I am not aware of the supplement that she is taking for neuropathy to be available in liquid form but potassium pills can be dissolved in water.  Typically it is 1 pill in 15 mL of water.  Would not crush. The difficulty swallowing may be manifestations of her underlying dementia.  However, I am glad to refer to gastroenterology if he is interested in swallowing studies for this patient.  Can also consider speech pathology swallow evaluation to ensure that she does not need a modified diet  As far as the shingles vaccination goes, I think that given her chronic illness it is reasonable not to pursue vaccination if he is worried about side effects.  Typically most patients experience some generalized discomfort at the injection site, sometimes they experience a localized site reaction which would include redness, inflammation and warmth at the injection site.  However, many have reports to me that they feel generally rundown for a day or 2 after the vaccine but then go back to their normal state of health

## 2021-08-30 NOTE — Telephone Encounter (Signed)
Lmtcb.

## 2021-08-30 NOTE — Telephone Encounter (Signed)
Called pt for AWV. In discussing medications with pt's son, Luciana Axe, he has questions about if the pt's potassium and Benfotamine possibly come in a liquid or are crushable? Luciana Axe states pt is having issues swallowing at times.   Also discussed Shingrix vaccine and Luciana Axe is concerned about the side effects of the vaccine and should his mom receive it? Thank you.

## 2021-08-30 NOTE — Patient Instructions (Addendum)
Kari [name] , Thank you for taking time to come for your Medicare Wellness Visit. I appreciate your ongoing commitment to your health goals. Please review the following plan we discussed and let me know if I can assist you in the future.   Screening recommendations/referrals: Colonoscopy: No longer required due to age. Mammogram: No longer required due to age.  Bone Density: No longer required due to age.  Recommended yearly ophthalmology/optometry visit for glaucoma screening and checkup Recommended yearly dental visit for hygiene and checkup  Vaccinations: Influenza vaccine: Done 06/01/2021 Repeat annually  Pneumococcal vaccine: Done 01/06/2014 and 02/02/2015 Tdap vaccine: Done 05/05/2011 Repeat in 10 years  Shingles vaccine: Shingrix discussed. Please contact your pharmacy for coverage information.     Covid-19:Done 09/16/2019, 10/14/2019, 06/27/2020, 03/15/2021.  Advanced directives: Advance directive discussed with you today. Even though you declined this today, please call our office should you change your mind, and we can give you the proper paperwork for you to fill out.   Conditions/risks identified: Aim for movement, every day, drink 6-8 glasses of water and eat lots of fruits and vegetables.   Next appointment: Follow up in one year for your annual wellness visit 09/05/2022 at 1:15 pm.   Preventive Care 65 Years and Older, Female Preventive care refers to lifestyle choices and visits with your health care provider that can promote health and wellness. What does preventive care include? A yearly physical exam. This is also called an annual well check. Dental exams once or twice a year. Routine eye exams. Ask your health care provider how often you should have your eyes checked. Personal lifestyle choices, including: Daily care of your teeth and gums. Regular physical activity. Eating a healthy diet. Avoiding tobacco and drug use. Limiting alcohol use. Practicing safe  sex. Taking low-dose aspirin every day. Taking vitamin and mineral supplements as recommended by your health care provider. What happens during an annual well check? The services and screenings done by your health care provider during your annual well check will depend on your age, overall health, lifestyle risk factors, and family history of disease. Counseling  Your health care provider may ask you questions about your: Alcohol use. Tobacco use. Drug use. Emotional well-being. Home and relationship well-being. Sexual activity. Eating habits. History of falls. Memory and ability to understand (cognition). Work and work Statistician. Reproductive health. Screening  You may have the following tests or measurements: Height, weight, and BMI. Blood pressure. Lipid and cholesterol levels. These may be checked every 5 years, or more frequently if you are over 89 years old. Skin check. Lung cancer screening. You may have this screening every year starting at age 72 if you have a 30-pack-year history of smoking and currently smoke or have quit within the past 15 years. Fecal occult blood test (FOBT) of the stool. You may have this test every year starting at age 40. Flexible sigmoidoscopy or colonoscopy. You may have a sigmoidoscopy every 5 years or a colonoscopy every 10 years starting at age 19. Hepatitis C blood test. Hepatitis B blood test. Sexually transmitted disease (STD) testing. Diabetes screening. This is done by checking your blood sugar (glucose) after you have not eaten for a while (fasting). You may have this done every 1-3 years. Bone density scan. This is done to screen for osteoporosis. You may have this done starting at age 2. Mammogram. This may be done every 1-2 years. Talk to your health care provider about how often you should have regular mammograms. Talk with  your health care provider about your test results, treatment options, and if necessary, the need for more  tests. Vaccines  Your health care provider may recommend certain vaccines, such as: Influenza vaccine. This is recommended every year. Tetanus, diphtheria, and acellular pertussis (Tdap, Td) vaccine. You may need a Td booster every 10 years. Zoster vaccine. You may need this after age 12. Pneumococcal 13-valent conjugate (PCV13) vaccine. One dose is recommended after age 65. Pneumococcal polysaccharide (PPSV23) vaccine. One dose is recommended after age 7. Talk to your health care provider about which screenings and vaccines you need and how often you need them. This information is not intended to replace advice given to you by your health care provider. Make sure you discuss any questions you have with your health care provider. Document Released: 09/16/2015 Document Revised: 05/09/2016 Document Reviewed: 06/21/2015 Elsevier Interactive Patient Education  2017 [place] Prevention in the Home Falls can cause injuries. They can happen to people of all ages. There are many things you can do to make your home safe and to help prevent falls. What can I do on the outside of my home? Regularly fix the edges of walkways and driveways and fix any cracks. Remove anything that might make you trip as you walk through a door, such as a raised step or threshold. Trim any bushes or trees on the path to your home. Use bright outdoor lighting. Clear any walking paths of anything that might make someone trip, such as rocks or tools. Regularly check to see if handrails are loose or broken. Make sure that both sides of any steps have handrails. Any raised decks and porches should have guardrails on the edges. Have any leaves, snow, or ice cleared regularly. Use sand or salt on walking paths during winter. Clean up any spills in your garage right away. This includes oil or grease spills. What can I do in the bathroom? Use night lights. Install grab bars by the toilet and in the tub and shower.  Do not use towel bars as grab bars. Use non-skid mats or decals in the tub or shower. If you need to sit down in the shower, use a plastic, non-slip stool. Keep the floor dry. Clean up any water that spills on the floor as soon as it happens. Remove soap buildup in the tub or shower regularly. Attach bath mats securely with double-sided non-slip rug tape. Do not have throw rugs and other things on the floor that can make you trip. What can I do in the bedroom? Use night lights. Make sure that you have a light by your bed that is easy to reach. Do not use any sheets or blankets that are too big for your bed. They should not hang down onto the floor. Have a firm chair that has side arms. You can use this for support while you get dressed. Do not have throw rugs and other things on the floor that can make you trip. What can I do in the kitchen? Clean up any spills right away. Avoid walking on wet floors. Keep items that you use a lot in easy-to-reach places. If you need to reach something above you, use a strong step stool that has a grab bar. Keep electrical cords out of the way. Do not use floor polish or wax that makes floors slippery. If you must use wax, use non-skid floor wax. Do not have throw rugs and other things on the floor that can make you trip.  What can I do with my stairs? Do not leave any items on the stairs. Make sure that there are handrails on both sides of the stairs and use them. Fix handrails that are broken or loose. Make sure that handrails are as long as the stairways. Check any carpeting to make sure that it is firmly attached to the stairs. Fix any carpet that is loose or worn. Avoid having throw rugs at the top or bottom of the stairs. If you do have throw rugs, attach them to the floor with carpet tape. Make sure that you have a light switch at the top of the stairs and the bottom of the stairs. If you do not have them, ask someone to add them for you. What else  can I do to help prevent falls? Wear shoes that: Do not have high heels. Have rubber bottoms. Are comfortable and fit you well. Are closed at the toe. Do not wear sandals. If you use a stepladder: Make sure that it is fully opened. Do not climb a closed stepladder. Make sure that both sides of the stepladder are locked into place. Ask someone to hold it for you, if possible. Clearly mark and make sure that you can see: Any grab bars or handrails. First and last steps. Where the edge of each step is. Use tools that help you move around (mobility aids) if they are needed. These include: Canes. Walkers. Scooters. Crutches. Turn on the lights when you go into a dark area. Replace any light bulbs as soon as they burn out. Set up your furniture so you have a clear path. Avoid moving your furniture around. If any of your floors are uneven, fix them. If there are any pets around you, be aware of where they are. Review your medicines with your doctor. Some medicines can make you feel dizzy. This can increase your chance of falling. Ask your doctor what other things that you can do to help prevent falls. This information is not intended to replace advice given to you by your health care provider. Make sure you discuss any questions you have with your health care provider. Document Released: 06/16/2009 Document Revised: 01/26/2016 Document Reviewed: 09/24/2014 Elsevier Interactive Patient Education  2017 [employer].

## 2021-08-31 ENCOUNTER — Telehealth: Payer: Self-pay

## 2021-08-31 ENCOUNTER — Other Ambulatory Visit: Payer: Self-pay

## 2021-08-31 NOTE — Telephone Encounter (Signed)
Pts son has been advised to dissolve potassium supplement in 58mL of water- about 1 tablespoon.  He does not want to have swallowing studies performed or refer to gi for now. Only if it is something that is emergent came up.  Advised son on shingles and that it was recommended but completely up to them if pt wanted.

## 2021-08-31 NOTE — Telephone Encounter (Signed)
Pt's son, Luciana Axe, tried to return my call. Called him back at [phone] and left a message on his identified voicemail advising him of all. Tobi Bastos to call office if he has any questions or concerns. Mjp,lpn

## 2021-09-03 DEATH — deceased

## 2021-09-25 ENCOUNTER — Encounter (HOSPITAL_COMMUNITY): Payer: Self-pay

## 2021-09-25 ENCOUNTER — Emergency Department (HOSPITAL_COMMUNITY): Payer: Medicare Other

## 2021-09-25 ENCOUNTER — Inpatient Hospital Stay (HOSPITAL_COMMUNITY)
Admission: EM | Admit: 2021-09-25 | Discharge: 2021-09-27 | DRG: 065 | Disposition: A | Discharge status: Home Health | Payer: Medicare Other | Attending: Internal Medicine | Admitting: Internal Medicine

## 2021-09-25 ENCOUNTER — Other Ambulatory Visit: Payer: Self-pay

## 2021-09-25 DIAGNOSIS — R1310 Dysphagia, unspecified: Secondary | ICD-10-CM | POA: Diagnosis not present

## 2021-09-25 DIAGNOSIS — Z049 Encounter for examination and observation for unspecified reason: Secondary | ICD-10-CM | POA: Diagnosis not present

## 2021-09-25 DIAGNOSIS — I482 Chronic atrial fibrillation, unspecified: Secondary | ICD-10-CM | POA: Diagnosis present

## 2021-09-25 DIAGNOSIS — E8881 Metabolic syndrome: Secondary | ICD-10-CM | POA: Diagnosis present

## 2021-09-25 DIAGNOSIS — E669 Obesity, unspecified: Secondary | ICD-10-CM | POA: Diagnosis present

## 2021-09-25 DIAGNOSIS — R4182 Altered mental status, unspecified: Secondary | ICD-10-CM | POA: Diagnosis not present

## 2021-09-25 DIAGNOSIS — Z20822 Contact with and (suspected) exposure to covid-19: Secondary | ICD-10-CM | POA: Diagnosis present

## 2021-09-25 DIAGNOSIS — Z6829 Body mass index (BMI) 29.0-29.9, adult: Secondary | ICD-10-CM

## 2021-09-25 DIAGNOSIS — E782 Mixed hyperlipidemia: Secondary | ICD-10-CM | POA: Diagnosis not present

## 2021-09-25 DIAGNOSIS — R531 Weakness: Secondary | ICD-10-CM | POA: Diagnosis not present

## 2021-09-25 DIAGNOSIS — R471 Dysarthria and anarthria: Secondary | ICD-10-CM | POA: Diagnosis present

## 2021-09-25 DIAGNOSIS — Z8673 Personal history of transient ischemic attack (TIA), and cerebral infarction without residual deficits: Secondary | ICD-10-CM | POA: Diagnosis not present

## 2021-09-25 DIAGNOSIS — I6503 Occlusion and stenosis of bilateral vertebral arteries: Secondary | ICD-10-CM | POA: Diagnosis not present

## 2021-09-25 DIAGNOSIS — Z8249 Family history of ischemic heart disease and other diseases of the circulatory system: Secondary | ICD-10-CM

## 2021-09-25 DIAGNOSIS — I6602 Occlusion and stenosis of left middle cerebral artery: Secondary | ICD-10-CM | POA: Diagnosis not present

## 2021-09-25 DIAGNOSIS — Z79899 Other long term (current) drug therapy: Secondary | ICD-10-CM

## 2021-09-25 DIAGNOSIS — R2990 Unspecified symptoms and signs involving the nervous system: Secondary | ICD-10-CM | POA: Diagnosis not present

## 2021-09-25 DIAGNOSIS — I4891 Unspecified atrial fibrillation: Secondary | ICD-10-CM | POA: Diagnosis present

## 2021-09-25 DIAGNOSIS — I708 Atherosclerosis of other arteries: Secondary | ICD-10-CM | POA: Diagnosis present

## 2021-09-25 DIAGNOSIS — Z888 Allergy status to other drugs, medicaments and biological substances status: Secondary | ICD-10-CM

## 2021-09-25 DIAGNOSIS — K7689 Other specified diseases of liver: Secondary | ICD-10-CM | POA: Diagnosis not present

## 2021-09-25 DIAGNOSIS — I6523 Occlusion and stenosis of bilateral carotid arteries: Secondary | ICD-10-CM | POA: Diagnosis not present

## 2021-09-25 DIAGNOSIS — G928 Other toxic encephalopathy: Secondary | ICD-10-CM

## 2021-09-25 DIAGNOSIS — I639 Cerebral infarction, unspecified: Secondary | ICD-10-CM

## 2021-09-25 DIAGNOSIS — I63441 Cerebral infarction due to embolism of right cerebellar artery: Principal | ICD-10-CM | POA: Diagnosis present

## 2021-09-25 DIAGNOSIS — I10 Essential (primary) hypertension: Secondary | ICD-10-CM | POA: Diagnosis present

## 2021-09-25 DIAGNOSIS — E785 Hyperlipidemia, unspecified: Secondary | ICD-10-CM | POA: Diagnosis not present

## 2021-09-25 DIAGNOSIS — F0390 Unspecified dementia without behavioral disturbance: Secondary | ICD-10-CM | POA: Diagnosis present

## 2021-09-25 DIAGNOSIS — M2550 Pain in unspecified joint: Secondary | ICD-10-CM | POA: Diagnosis not present

## 2021-09-25 DIAGNOSIS — R269 Unspecified abnormalities of gait and mobility: Secondary | ICD-10-CM | POA: Diagnosis present

## 2021-09-25 DIAGNOSIS — N2889 Other specified disorders of kidney and ureter: Secondary | ICD-10-CM | POA: Diagnosis not present

## 2021-09-25 DIAGNOSIS — Z7901 Long term (current) use of anticoagulants: Secondary | ICD-10-CM

## 2021-09-25 DIAGNOSIS — Z7401 Bed confinement status: Secondary | ICD-10-CM | POA: Diagnosis not present

## 2021-09-25 DIAGNOSIS — Z880 Allergy status to penicillin: Secondary | ICD-10-CM

## 2021-09-25 DIAGNOSIS — J811 Chronic pulmonary edema: Secondary | ICD-10-CM | POA: Diagnosis not present

## 2021-09-25 DIAGNOSIS — R112 Nausea with vomiting, unspecified: Secondary | ICD-10-CM | POA: Diagnosis not present

## 2021-09-25 DIAGNOSIS — R29714 NIHSS score 14: Secondary | ICD-10-CM | POA: Diagnosis present

## 2021-09-25 DIAGNOSIS — K5730 Diverticulosis of large intestine without perforation or abscess without bleeding: Secondary | ICD-10-CM | POA: Diagnosis not present

## 2021-09-25 DIAGNOSIS — R4781 Slurred speech: Secondary | ICD-10-CM | POA: Diagnosis not present

## 2021-09-25 DIAGNOSIS — R918 Other nonspecific abnormal finding of lung field: Secondary | ICD-10-CM | POA: Diagnosis not present

## 2021-09-25 DIAGNOSIS — G9349 Other encephalopathy: Secondary | ICD-10-CM | POA: Diagnosis present

## 2021-09-25 DIAGNOSIS — Z823 Family history of stroke: Secondary | ICD-10-CM | POA: Diagnosis not present

## 2021-09-25 DIAGNOSIS — R400 Somnolence: Secondary | ICD-10-CM | POA: Diagnosis present

## 2021-09-25 DIAGNOSIS — I6389 Other cerebral infarction: Secondary | ICD-10-CM | POA: Diagnosis present

## 2021-09-25 DIAGNOSIS — K8020 Calculus of gallbladder without cholecystitis without obstruction: Secondary | ICD-10-CM | POA: Diagnosis not present

## 2021-09-25 LAB — CBC
HCT: 45.3 % (ref 36.0–46.0)
Hemoglobin: 14.4 g/dL (ref 12.0–15.0)
MCH: 29.9 pg (ref 26.0–34.0)
MCHC: 31.8 g/dL (ref 30.0–36.0)
MCV: 94.2 fL (ref 80.0–100.0)
Platelets: 231 10*3/uL (ref 150–400)
RBC: 4.81 MIL/uL (ref 3.87–5.11)
RDW: 14.7 % (ref 11.5–15.5)
WBC: 10.9 10*3/uL — ABNORMAL HIGH (ref 4.0–10.5)
nRBC: 0 % (ref 0.0–0.2)

## 2021-09-25 LAB — PROTIME-INR
INR: 1.4 — ABNORMAL HIGH (ref 0.8–1.2)
Prothrombin Time: 16.7 seconds — ABNORMAL HIGH (ref 11.4–15.2)

## 2021-09-25 LAB — URINALYSIS, ROUTINE W REFLEX MICROSCOPIC
Bilirubin Urine: NEGATIVE
Glucose, UA: NEGATIVE mg/dL
Hgb urine dipstick: NEGATIVE
Ketones, ur: NEGATIVE mg/dL
Leukocytes,Ua: NEGATIVE
Nitrite: NEGATIVE
Protein, ur: NEGATIVE mg/dL
Specific Gravity, Urine: 1.025 (ref 1.005–1.030)
pH: 7 (ref 5.0–8.0)

## 2021-09-25 LAB — I-STAT CHEM 8, ED
BUN: 6 mg/dL — ABNORMAL LOW (ref 8–23)
Calcium, Ion: 1.03 mmol/L — ABNORMAL LOW (ref 1.15–1.40)
Chloride: 102 mmol/L (ref 98–111)
Creatinine, Ser: 0.3 mg/dL — ABNORMAL LOW (ref 0.44–1.00)
Glucose, Bld: 147 mg/dL — ABNORMAL HIGH (ref 70–99)
HCT: 45 % (ref 36.0–46.0)
Hemoglobin: 15.3 g/dL — ABNORMAL HIGH (ref 12.0–15.0)
Potassium: 4 mmol/L (ref 3.5–5.1)
Sodium: 137 mmol/L (ref 135–145)
TCO2: 26 mmol/L (ref 22–32)

## 2021-09-25 LAB — COMPREHENSIVE METABOLIC PANEL
ALT: 10 U/L (ref 0–44)
AST: 27 U/L (ref 15–41)
Albumin: 3.4 g/dL — ABNORMAL LOW (ref 3.5–5.0)
Alkaline Phosphatase: 68 U/L (ref 38–126)
Anion gap: 8 (ref 5–15)
BUN: 7 mg/dL — ABNORMAL LOW (ref 8–23)
CO2: 22 mmol/L (ref 22–32)
Calcium: 8.5 mg/dL — ABNORMAL LOW (ref 8.9–10.3)
Chloride: 105 mmol/L (ref 98–111)
Creatinine, Ser: 0.51 mg/dL (ref 0.44–1.00)
GFR, Estimated: >60 mL/min (ref >60)
Glucose, Bld: 146 mg/dL — ABNORMAL HIGH (ref 70–99)
Potassium: 4.3 mmol/L (ref 3.5–5.1)
Sodium: 135 mmol/L (ref 135–145)
Total Bilirubin: 1.3 mg/dL — ABNORMAL HIGH (ref 0.3–1.2)
Total Protein: 6.6 g/dL (ref 6.5–8.1)

## 2021-09-25 LAB — DIFFERENTIAL
Abs Immature Granulocytes: 0.05 10*3/uL (ref 0.00–0.07)
Basophils Absolute: 0 10*3/uL (ref 0.0–0.1)
Basophils Relative: 0 %
Eosinophils Absolute: 0 10*3/uL (ref 0.0–0.5)
Eosinophils Relative: 0 %
Immature Granulocytes: 1 %
Lymphocytes Relative: 9 %
Lymphs Abs: 1 10*3/uL (ref 0.7–4.0)
Monocytes Absolute: 0.5 10*3/uL (ref 0.1–1.0)
Monocytes Relative: 4 %
Neutro Abs: 9.3 10*3/uL — ABNORMAL HIGH (ref 1.7–7.7)
Neutrophils Relative %: 86 %

## 2021-09-25 LAB — LIPASE, BLOOD: Lipase: 25 U/L (ref 11–51)

## 2021-09-25 LAB — APTT: aPTT: 38 seconds — ABNORMAL HIGH (ref 24–36)

## 2021-09-25 MED ORDER — LACTATED RINGERS IV BOLUS
1000.0000 mL | Freq: Once | INTRAVENOUS | Status: AC
  Administered 2021-09-25: 1000 mL via INTRAVENOUS

## 2021-09-25 MED ORDER — LACTATED RINGERS IV SOLN: INTRAVENOUS | Status: DC

## 2021-09-25 MED ORDER — IOHEXOL 350 MG/ML SOLN
75.0000 mL | Freq: Once | INTRAVENOUS | Status: AC | PRN
  Administered 2021-09-25: 75 mL via INTRAVENOUS

## 2021-09-25 MED ORDER — LORAZEPAM 2 MG/ML IJ SOLN: 1.0000 mg | Freq: Once | INTRAMUSCULAR | Status: DC | PRN

## 2021-09-25 MED ORDER — SODIUM CHLORIDE 0.9% FLUSH: 3.0000 mL | Freq: Once | INTRAVENOUS | Status: DC

## 2021-09-25 NOTE — H&P (Signed)
History and Physical    Kari [name]:[id number] DOB: 1934/06/06 DOA: 09/25/2021  PCP: [name], DO   Patient coming from: Home  I have personally briefly reviewed patient's old medical records in [place]  Chief complaint: Weakness, difficulty speaking History of present illness: 88 year old Caucasian female with a history of chronic A. fib on anticoagulation with Eliquis, hypertension, history of strokes, hyperlipidemia, hypertension presents to the ER today with a acute change in mental status and weakness today.  This happened approximately 12 to 1:00 PM.  Daughter states that she and the patient were out shopping.  Patient was unable to get back into the minivan due to weakness in her leg.  Daughter states the patient's voice got very very quiet.  She thought the patient may have had a stroke and brought the patient to the ER.  On arrival to the ER, blood pressure 184/90.  Code stroke called.  Patient seen by stroke neurologist.  tPA not given due to the patient being on full anticoagulation.  Initial head CT's were negative for acute stroke.  MRI of the brain demonstrated acute CVA of the right medial superior cerebellar hemisphere and the left parietal cortex.  Neurology has made several recommendations including stopping systemic anticoagulation for now.  [facility] hospital[facility] contacted for admission.   ED Course: code stroke called. CT head negative for acute CVA. MRI positive for multiple areas of CVA.  Review of Systems:  Review of Systems  Unable to perform ROS: Patient nonverbal   Past Medical History:  Diagnosis Date   Adenomatous polyps    AF (atrial fibrillation) (HCC)    Cataract    Gallstones    Hx of adenomatous colonic polyps    Hyperlipidemia    x5 years   Hypertension    x5 years   Kyphosis    Metabolic syndrome X    Obesity    Osteoporosis    Stroke ([place]) 07/2017   Subdural hematoma    Symptomatic menopausal or female  climacteric states    Vitamin D deficiency     Past Surgical History:  Procedure Laterality Date   ABDOMINAL HYSTERECTOMY     BLADDER SUSPENSION     COLONOSCOPY  multiple   ESOPHAGEAL DILATION N/A 03/03/2018   Procedure: ESOPHAGEAL DILATION;  Surgeon: Rogene Houston, MD;  [facility];  Service: Endoscopy;  Laterality: N/A;   ESOPHAGOGASTRODUODENOSCOPY N/A 03/03/2018   Procedure: ESOPHAGOGASTRODUODENOSCOPY (EGD);  Surgeon: Rogene Houston, MD;  Location: [facility];  Service: Endoscopy;  Laterality: N/A;   EYE SURGERY Bilateral 09/2015   VAGINAL HYSTERECTOMY     total     reports that she has never smoked. She has never used smokeless tobacco. She reports that she does not drink alcohol and does not use drugs.  Allergies  Allergen Reactions   Erythromycin Other (See Comments)    Reaction unknown   Penicillins Other (See Comments)    Reaction unknown  Has patient had a PCN reaction causing immediate rash, facial/tongue/throat swelling, SOB or lightheadedness with hypotension: unknown Has patient had a PCN reaction causing severe rash involving mucus membranes or skin necrosis: unknown Has patient had a PCN reaction that required hospitalization : unknown Has patient had a PCN reaction occurring within the last 10 years: no If all of the above answers are "NO", then may proceed with Cephalosporin use.     Family History  Problem Relation Age of Onset   Heart attack Mother  Stroke Mother    Heart attack Father    Alcohol abuse Father    Hypertension Sister    Stroke Sister    Stroke Brother    Hypertension Brother    Heart attack Brother    Hypertension Brother    Depression Brother        suicide   Hypertension Sister    Heart attack Other    Stroke Other    Breast cancer Other    Colon cancer Neg Hx     Prior to Admission medications   Medication Sig Start Date End Date Taking? Authorizing Provider  apixaban (ELIQUIS) 5 MG TABS tablet Take 1  tablet (5 mg total) by mouth 2 (two) times daily. 12/21/20   [name], DO  atenolol (TENORMIN) 50 MG tablet TAKE 1 TABLET DAILY Patient taking differently: Take 50 mg by mouth daily. 06/20/21   Ronnie Doss M, DO  Benfotiamine 150 MG CAPS Take 150 mg by mouth daily.    [provider]  captopril (CAPOTEN) 50 MG tablet TAKE 1 TABLET BY MOUTH 2 TIMES DAILY FOR HIGH BLOOD PRESSURE. Patient taking differently: Take 50 mg by mouth 2 (two) times daily. 06/07/21   [name], DO  ezetimibe (ZETIA) 10 MG tablet TAKE 1 TABLET ONCE DAILY FOR CHOLESTEROL Patient taking differently: Take 10 mg by mouth daily. 06/16/21   [name], DO  fluconazole (DIFLUCAN) 150 MG tablet Take one tablet by mouth now. Repeat dose in 7 days. Patient not taking: Reported on 08/30/2021 06/29/21   Gwenlyn Perking, FNP  loratadine (CLARITIN) 10 MG tablet Take 1 tablet (10 mg total) by mouth daily. 06/07/20   [name], DO  Multiple Vitamins-Minerals (ZINC PO) Take by mouth.    [provider]  potassium chloride (KLOR-CON) 10 MEQ tablet TAKE 1 TABLET DAILY Patient taking differently: Take 10 mEq by mouth daily. 06/28/21   [name], DO  pravastatin (PRAVACHOL) 80 MG tablet TAKE 1 TABLET ONCE DAILY FOR CHOLESTEROL Patient taking differently: Take 80 mg by mouth daily. 12/21/19   [name], DO  rivastigmine (EXELON) 4.6 mg/24hr Place 1 patch (4.6 mg total) onto the skin daily. 05/09/21   Claretta Fraise, MD  vitamin C (ASCORBIC ACID) 500 MG tablet Take 500 mg by mouth daily.    [provider]    Physical Exam: Vitals:   09/25/21 1700 09/25/21 1900 09/25/21 1930 09/25/21 2100  BP: (!) 126/103 110/84 108/61 126/75  Pulse: (!) 118 65 98 91  Resp: (!) 23 20 13 16   Temp:      TempSrc:      SpO2: 92% 99% 98% 98%  Weight:        Physical Exam Vitals and nursing note reviewed.  Constitutional:      General: She is not in acute distress.     Appearance: She is obese. She is not ill-appearing, toxic-appearing or diaphoretic.  HENT:     Head: Normocephalic and atraumatic.     Nose: Nose normal. No rhinorrhea.  Eyes:     General: No scleral icterus. Cardiovascular:     Rate and Rhythm: Normal rate. Rhythm irregular.  Pulmonary:     Effort: Pulmonary effort is normal. No respiratory distress.     Breath sounds: No wheezing.  Abdominal:     General: Bowel sounds are normal. There is no distension.     Tenderness: There is no abdominal tenderness. There is no guarding or rebound.  Musculoskeletal:  Right lower leg: No edema.     Left lower leg: No edema.  Skin:    General: Skin is warm and dry.     Capillary Refill: Capillary refill takes less than 2 seconds.  Neurological:     Comments: Only mumbles Able to follow commands Voluntary movement of both UE and LE Equal bilateral hand grip strength +4/5  Able to move both lower legs and feet on command. Unable to raise either leg off bed.     Labs on Admission: I have personally reviewed following labs and imaging studies  CBC: Recent Labs  Lab 09/25/21 1802 09/25/21 1803  WBC 10.9*  --   NEUTROABS 9.3*  --   HGB 14.4 15.3*  HCT 45.3 45.0  MCV 94.2  --   PLT 231  --    Basic Metabolic Panel: Recent Labs  Lab 09/25/21 1802 09/25/21 1803  NA 135 137  K 4.3 4.0  CL 105 102  CO2 22  --   GLUCOSE 146* 147*  BUN 7* 6*  CREATININE 0.51 0.30*  CALCIUM 8.5*  --    GFR: Estimated Creatinine Clearance: 48.5 mL/min (A) (by C-G formula based on SCr of 0.3 mg/dL (L)). Liver Function Tests: Recent Labs  Lab 09/25/21 1802  AST 27  ALT 10  ALKPHOS 68  BILITOT 1.3*  PROT 6.6  ALBUMIN 3.4*   Recent Labs  Lab 09/25/21 1802  LIPASE 25   No results for input(s): AMMONIA in the last 168 hours. Coagulation Profile: Recent Labs  Lab 09/25/21 1802  INR 1.4*   Cardiac Enzymes: No results for input(s): CKTOTAL, CKMB, CKMBINDEX, TROPONINI in the last 168  hours. BNP (last 3 results) No results for input(s): PROBNP in the last 8760 hours. HbA1C: No results for input(s): HGBA1C in the last 72 hours. CBG: No results for input(s): GLUCAP in the last 168 hours. Lipid Profile: No results for input(s): CHOL, HDL, LDLCALC, TRIG, CHOLHDL, LDLDIRECT in the last 72 hours. Thyroid Function Tests: No results for input(s): TSH, T4TOTAL, FREET4, T3FREE, THYROIDAB in the last 72 hours. Anemia Panel: No results for input(s): VITAMINB12, FOLATE, FERRITIN, TIBC, IRON, RETICCTPCT in the last 72 hours. Urine analysis:    Component Value Date/Time   COLORURINE YELLOW 09/25/2021 1933   APPEARANCEUR CLEAR 09/25/2021 1933   APPEARANCEUR Cloudy (A) 06/29/2021 1408   LABSPEC 1.025 09/25/2021 1933   PHURINE 7.0 09/25/2021 1933   GLUCOSEU NEGATIVE 09/25/2021 [place] NEGATIVE 09/25/2021 1933   BILIRUBINUR NEGATIVE 09/25/2021 1933   BILIRUBINUR Negative 06/29/2021 [place] 09/25/2021 1933   PROTEINUR NEGATIVE 09/25/2021 1933   NITRITE NEGATIVE 09/25/2021 1933   LEUKOCYTESUR NEGATIVE 09/25/2021 1933    Radiological Exams on Admission: I have personally reviewed images CT ABDOMEN PELVIS WO CONTRAST  Result Date: 09/25/2021 CLINICAL DATA:  abdominal distension.  Vomiting. EXAM: CT ABDOMEN AND PELVIS WITHOUT CONTRAST TECHNIQUE: Multidetector CT imaging of the abdomen and pelvis was performed following the standard protocol without IV contrast. RADIATION DOSE REDUCTION: This exam was performed according to the departmental dose-optimization program which includes automated exposure control, adjustment of the mA and/or kV according to patient size and/or use of iterative reconstruction technique. COMPARISON:  07/30/2021 FINDINGS: Lower chest: Motion degraded evaluation of the lung bases. Bibasilar scarring cardiomegaly, with multivessel coronary artery calcification. Small hiatal hernia. Hepatobiliary: Hepatic cysts. Gallstones up to 1.0 cm without  acute cholecystitis or biliary duct dilatation. Pancreas: Normal pancreas for age. Spleen: Normal in size, without focal abnormality.  Adrenals/Urinary Tract: Mild left adrenal thickening. Normal right adrenal gland. Mildly scarred left kidney. No hydronephrosis. Contrast within renal collecting systems and bladder from today's CTA. Pelvic floor laxity with small cystocele. Stomach/Bowel: Normal remainder of the stomach. Extensive colonic diverticulosis. Motion degradation in the upper abdomen. Normal terminal ileum and appendix. Normal small bowel. Vascular/Lymphatic: Aortic atherosclerosis. No abdominopelvic adenopathy. Reproductive: Hysterectomy.  No adnexal mass. Other: No significant free fluid.  No free intraperitoneal air. Musculoskeletal: Osteopenia. Trace L4-5 anterolisthesis. Lumbosacral spondylosis. S shaped thoracolumbar spine curvature. IMPRESSION: 1.  No acute process in the abdomen or pelvis. 2. Cholelithiasis. 3. Pelvic floor laxity with cystocele. 4. Coronary artery atherosclerosis. Aortic Atherosclerosis (ICD10-I70.0). 5.  Tiny hiatal hernia. Electronically Signed   By: Abigail Miyamoto M.D.   On: 09/25/2021 18:04   MR BRAIN WO CONTRAST  Result Date: 09/25/2021 CLINICAL DATA:  Stroke suspected EXAM: MRI HEAD WITHOUT CONTRAST TECHNIQUE: Multiplanar, multiecho pulse sequences of the brain and surrounding structures were obtained without intravenous contrast. COMPARISON:  No prior MRI, correlation is made with same day CT and CTA FINDINGS: Evaluation is limited due to motion artifact and patient inability to cooperate. Brain: Moderate area of restricted diffusion with ADC correlate in the right medial superior cerebellar hemisphere, which measures up to 2.6 x 2.4 x 2.5 cm (AP x TR x CC) (series 3, image 18 and series 4, images 13 and 11). Additional area of restricted diffusion with ADC correlate in the left parietal cortex (series 3, image 34). No definite acute hemorrhage, mass, mass effect, or  midline shift. More remote bilateral cerebellar infarcts. Dilated perivascular spaces in the bilateral basal ganglia. Confluent T2 hyperintense signal in the periventricular white matter, likely the sequela of severe chronic small vessel ischemic disease. No hydrocephalus or extra-axial collection. Vascular: Normal flow voids, within the aforementioned limitations. Skull and upper cervical spine: Normal marrow signal. Sinuses/Orbits: Left maxillary mucous retention cyst. Status post bilateral lens replacements. Other: None. IMPRESSION: Evaluation is significantly limited by motion artifact and patient noncooperation. Within this limitation, there are acute infarcts in the right medial superior cerebellum and left parietal cortex. Given multiple vascular territories, consider an embolic etiology. These results were called by telephone at the time of interpretation on 09/25/2021 at 9:38 pm to provider M[place] , who verbally acknowledged these results. Electronically Signed   By: Merilyn Baba M.D.   On: 09/25/2021 21:38   DG Chest Portable 1 View  Result Date: 09/25/2021 CLINICAL DATA:  Rule out aspiration EXAM: PORTABLE CHEST 1 VIEW COMPARISON:  Chest x-ray dated July 20, 2021 FINDINGS: Cardiac and mediastinal contours are unchanged large and unchanged when compared with prior exam. Bilateral interstitial opacities. No large pleural effusion or pneumothorax. IMPRESSION: Bilateral interstitial opacities, favor pulmonary edema. Electronically Signed   By: Yetta Glassman M.D.   On: 09/25/2021 19:09   CT HEAD CODE STROKE WO CONTRAST  Result Date: 09/25/2021 CLINICAL DATA:  Code stroke.  Neuro deficit, acute, stroke suspected EXAM: CT HEAD WITHOUT CONTRAST TECHNIQUE: Contiguous axial images were obtained from the base of the skull through the vertex without intravenous contrast. RADIATION DOSE REDUCTION: This exam was performed according to the departmental dose-optimization program which includes  automated exposure control, adjustment of the mA and/or kV according to patient size and/or use of iterative reconstruction technique. COMPARISON:  07/30/2021 FINDINGS: Brain: There is no acute intracranial hemorrhage mass effect. There are new bilateral cerebellar infarcts. Several chronic infarcts bilateral cerebral hemispheres as seen previously. Superimposed confluent areas of low-density in the  supratentorial white matter probably reflect marked chronic microvascular ischemic changes. Prominence of the ventricles and sulci reflects stable parenchymal volume loss. No extra-axial collection. Vascular: There is intracranial atherosclerotic calcification at the skull base. No hyperdense vessel. Skull: No acute abnormality. Sinuses/Orbits: Bilateral lens replacements. Left maxillary sinus polypoid mucosal thickening. Other: Mastoid air cells are clear. IMPRESSION: No acute intracranial hemorrhage. New age-indeterminate bilateral cerebellar infarcts. Multiple chronic infarcts and advanced chronic microvascular ischemic changes as seen previously. Initial results were communicated to Dr. Rory Percy at 3:50 pm on 09/25/2021 by text page via the [facility] messaging system. Electronically Signed   By: Macy Mis M.D.   On: 09/25/2021 15:53   CT ANGIO HEAD NECK W WO CM (CODE STROKE)  Result Date: 09/25/2021 CLINICAL DATA:  Neuro deficit, acute, stroke suspected EXAM: CT ANGIOGRAPHY HEAD AND NECK TECHNIQUE: Multidetector CT imaging of the head and neck was performed using the standard protocol during bolus administration of intravenous contrast. Multiplanar CT image reconstructions and MIPs were obtained to evaluate the vascular anatomy. Carotid stenosis measurements (when applicable) are obtained utilizing NASCET criteria, using the distal internal carotid diameter as the denominator. RADIATION DOSE REDUCTION: This exam was performed according to the departmental dose-optimization program which includes automated exposure  control, adjustment of the mA and/or kV according to patient size and/or use of iterative reconstruction technique. CONTRAST:  7mL OMNIPAQUE IOHEXOL 350 MG/ML SOLN COMPARISON:  None. FINDINGS: CTA NECK Aortic arch: Mixed but primarily calcified plaque along the arch. There is high-grade stenosis and no visible contrast opacification of proximal left subclavian. Reconstitution is present prior to vertebral origin. Right carotid system: Patent. Calcified plaque along the common carotid with minimal stenosis. Calcified plaque along the proximal internal carotid with less than 50% stenosis. Left carotid system: Patent. Mixed plaque along the common carotid with less than 50% stenosis. Calcified plaque along the proximal internal carotid with minimal stenosis. Vertebral arteries: Plaque at the left vertebral origin with possible marked stenosis. 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 There is plaque at the lower C2 level causing marked stenosis. The vessel again returns to a more normal caliber at the C1 level. Skeleton: Degenerative changes of the cervical spine. Other neck: Fatty atrophy of the right submandibular gland. Upper chest: No apical lung mass. Review of the MIP images confirms the above findings CTA HEAD Anterior circulation: Intracranial internal carotid arteries are patent with calcified plaque. There is mild stenosis primarily in the supraclinoid portions. Anterior and middle cerebral arteries are patent. There is mild stenosis of the left M1 MCA. Mild atherosclerotic irregularity distal MCA branches bilaterally. Posterior circulation: Intracranial vertebral arteries are patent with calcified plaque causing mild stenosis. A right PICA is present and patent. Left PICA is not identified. Probable small left AICA. Basilar is patent with mild calcified plaque. Posterior cerebral arteries are patent with mild atherosclerotic irregularity.  Small left posterior communicating artery. Venous sinuses: Patent as allowed by contrast bolus timing. 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 Subsequent plaque causes marked stenosis with the vessel returning to a more normal caliber at the C1 level. Intracranial vertebral arteries and basilar artery are patent without significant stenosis. Right PICA is patent. Probable small left diseased AICA. High-grade stenosis and visually occluded proximal left subclavian artery with reconstitution prior to vertebral origin. Plaque at the ICA origins causes less than 50% stenosis. Preliminary results discussed with Dr. Rory Percy  on 09/25/2021 at 4 p.m. Electronically Signed   By: Macy Mis M.D.   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  MRI shows multiple areas of stroke including right cerebellar and left parietal.  Complicating issue is that the patient had been on Coumadin in the past for A. fib.  She had a stroke while she was on Coumadin and subsequently changed over to Eliquis.  Today she has had multiple strokes despite being on Eliquis 5 mg twice daily.  Discussed with the daughter this is a poor prognostic sign as the patient's continued to thrombolic strokes despite being on full anticoagulation.  May need hematology input as to what may be the best anticoagulation strategy for someone who is failed Coumadin and Eliquis.  The only thing I can think of right now is to switch  her over to subcu Lovenox on a long-term basis.  I had a long discussion with the patient's daughter [name] at the bedside.  Patient is a full code for now although the daughter will talk to her brother about goals of care.  I also asked pain to consider talking with palliative care and/or hospice just to get the conversation started with the patient's mentation does not improve.  Patient is currently living at home with her son with daily help from the daughter [name].  [name] is aware of the patient's serious medical condition.  Patient's husband recently passed away from lung cancer.  Neurology wants to hold anticoagulation for now.  Patient does have a history of subdural hematomas.  She is at high risk for hemorrhagic conversion of her areas of stroke.  Essential hypertension Allowing for permissive hypertension given the recent history of her stroke.  Holding her blood pressure medicines for now.  Atrial fibrillation (HCC) Chronic A. fib.  Rate is currently controlled.Neurology wants to hold systemic anticoagulation i.e. Eliquis for now.  Patient's had embolic strokes on Coumadin and Eliquis.  See assessment plan for strokes.  Hyperlipidemia Chronic.  Check lipid panel.  Continue Zetia and Pravachol.  Chronic anticoagulation - on eliquis for afib. had CVA while taking coumadin and therefore changed to Eliquis Patient was on Eliquis for A. fib.  She has not missed any doses.  She had strokes while she is on Coumadin several years ago and was changed over to Eliquis.  DVT prophylaxis:  SCDs Code Status: Full Code Family Communication: discussed with pt's dtr [name] at bedside  Disposition Plan: home with [facility] vs SNF  Consults called: neurology  Admission status: Inpatient,  progressive   Kristopher Oppenheim, DO [facility] 09/25/2021, 10:42 PM

## 2021-09-25 NOTE — Assessment & Plan Note (Addendum)
MRI showed multiple infarcts.   LDL 61.  Continue with statin. HbA1c is pending Echocardiogram is pending PT OT SLP evaluation. Stroke team to see. Patient was on Eliquis which has been held.  To be placed on aspirin. Patient was previously on Coumadin which was changed over to Eliquis after previous stroke.

## 2021-09-25 NOTE — ED Notes (Signed)
Unable to draw blood from patient. Dr. Matilde Sprang made aware.

## 2021-09-25 NOTE — ED Provider Notes (Signed)
[facility] DEPART[facility] Provider Note  CSN: [id number] Arrival date & time: 09/25/21 1534  Chief Complaint(s) Altered Mental Status  HPI Kari [name] is a 88 y.o. female with PMH A. fib on Eliquis, HLD, dementia, HTN, previous CVA, previous subdural hematoma who presents the emergency department for evaluation of vomiting and altered mental status.  History obtained from patient's daughter who states that while in the facility today she had an episode of vomiting with sudden onset dysarthria, abdominal pain and difficulty walking.  Due to her history of previous strokes she was brought to the emergency department as a stroke alert.  On arrival the patient has dysarthria but no other focal neurologic deficits.  Additional history unable to be obtained due to the patient's underlying dementia.   Altered Mental Status Associated symptoms: abdominal pain and vomiting    Past Medical History Past Medical History:  Diagnosis Date   Adenomatous polyps    AF (atrial fibrillation) (HCC)    Cataract    Gallstones    Hx of adenomatous colonic polyps    Hyperlipidemia    x5 years   Hypertension    x5 years   Kyphosis    Metabolic syndrome X    Obesity    Osteoporosis    Stroke ([place]) 07/2017   Subdural hematoma    Symptomatic menopausal or female climacteric states    Vitamin D deficiency    Patient Active Problem List   Diagnosis Date Noted   History of iron deficiency anemia 11/15/2020   Cough 06/03/2020   Right flank pain 11/11/2018   Esophageal dysphagia 01/31/2018   Pain in both feet 10/03/2017   SDH (subdural hematoma) 08/24/2017   CVA (cerebral vascular accident) ([place]) 07/29/2017   Left hemiparesis ([place]) 07/19/2017   Sciatica of right side 04/23/2016   At high risk for falls 04/23/2016   Increased BMI 09/30/2015   Cataract cortical, senile, left 09/13/2015   Cataract, nuclear sclerotic, left eye 09/13/2015   Hyperlipidemia 09/07/2015   Low  back pain 09/07/2015   Orthostatic syncope 09/04/2015   Orthostatic hypotension 09/04/2015   Age-related macular degeneration, dry, both eyes 07/19/2015   Glaucoma suspect of both eyes 07/19/2015   Aortic atherosclerosis ([place]) 05/03/2015   Vitamin D deficiency 02/02/2015   Pre-diabetes 67/61/9509   Metabolic syndrome 32/67/1245   Osteoporosis 03/11/2013   Atrial fibrillation ([place]) 11/24/2012   Hypokalemia 05/04/2012   Obesity    Symptomatic menopausal or female climacteric states    Kyphosis    Gallstones    Personal history of adenomatous colonic polyps    Essential hypertension 03/29/2009   Home Medication(s) Prior to Admission medications   Medication Sig Start Date End Date Taking? Authorizing Provider  apixaban (ELIQUIS) 5 MG TABS tablet Take 1 tablet (5 mg total) by mouth 2 (two) times daily. 12/21/20   [name], DO  atenolol (TENORMIN) 50 MG tablet TAKE 1 TABLET DAILY Patient taking differently: Take 50 mg by mouth daily. 06/20/21   Ronnie Doss M, DO  Benfotiamine 150 MG CAPS Take 150 mg by mouth daily.    [provider]  captopril (CAPOTEN) 50 MG tablet TAKE 1 TABLET BY MOUTH 2 TIMES DAILY FOR HIGH BLOOD PRESSURE. Patient taking differently: Take 50 mg by mouth 2 (two) times daily. 06/07/21   [name], DO  ezetimibe (ZETIA) 10 MG tablet TAKE 1 TABLET ONCE DAILY FOR CHOLESTEROL Patient taking differently: Take 10 mg by mouth daily. 06/16/21   Ronnie Doss  M, DO  fluconazole (DIFLUCAN) 150 MG tablet Take one tablet by mouth now. Repeat dose in 7 days. Patient not taking: Reported on 08/30/2021 06/29/21   Gwenlyn Perking, FNP  loratadine (CLARITIN) 10 MG tablet Take 1 tablet (10 mg total) by mouth daily. 06/07/20   [name], DO  Multiple Vitamins-Minerals (ZINC PO) Take by mouth.    [provider]  potassium chloride (KLOR-CON) 10 MEQ tablet TAKE 1 TABLET DAILY Patient taking differently: Take 10 mEq by mouth  daily. 06/28/21   [name], DO  pravastatin (PRAVACHOL) 80 MG tablet TAKE 1 TABLET ONCE DAILY FOR CHOLESTEROL Patient taking differently: Take 80 mg by mouth daily. 12/21/19   [name], DO  rivastigmine (EXELON) 4.6 mg/24hr Place 1 patch (4.6 mg total) onto the skin daily. 05/09/21   Claretta Fraise, MD  vitamin C (ASCORBIC ACID) 500 MG tablet Take 500 mg by mouth daily.    [provider]                                                                                                                                    Past Surgical History Past Surgical History:  Procedure Laterality Date   ABDOMINAL HYSTERECTOMY     BLADDER SUSPENSION     COLONOSCOPY  multiple   ESOPHAGEAL DILATION N/A 03/03/2018   Procedure: ESOPHAGEAL DILATION;  Surgeon: Rogene Houston, MD;  Location: [facility];  Service: Endoscopy;  Laterality: N/A;   ESOPHAGOGASTRODUODENOSCOPY N/A 03/03/2018   Procedure: ESOPHAGOGASTRODUODENOSCOPY (EGD);  Surgeon: Rogene Houston, MD;  Location: [facility];  Service: Endoscopy;  Laterality: N/A;   EYE SURGERY Bilateral 09/2015   VAGINAL HYSTERECTOMY     total   Family History Family History  Problem Relation Age of Onset   Heart attack Mother    Stroke Mother    Heart attack Father    Alcohol abuse Father    Hypertension Sister    Stroke Sister    Stroke Brother    Hypertension Brother    Heart attack Brother    Hypertension Brother    Depression Brother        suicide   Hypertension Sister    Heart attack Other    Stroke Other    Breast cancer Other    Colon cancer Neg Hx     Social History Social History   Tobacco Use   Smoking status: Never   Smokeless tobacco: Never  Vaping Use   Vaping Use: Never used  Substance Use Topics   Alcohol use: No   Drug use: No   Allergies Erythromycin and Penicillins  Review of Systems Review of Systems  Gastrointestinal:  Positive for abdominal pain and vomiting.  Neurological:   Positive for speech difficulty.   Physical Exam Vital Signs  I have reviewed the triage vital signs BP (!) 108/91 (BP Location: Left Arm)    Pulse Marland Kitchen)  106    Temp 97.7 F (36.5 C) (Oral)    Resp (!) 21    Wt 74.2 kg    SpO2 94%    BMI 29.92 kg/m   Physical Exam Vitals and nursing note reviewed.  Constitutional:      General: She is not in acute distress.    Appearance: She is well-developed.  HENT:     Head: Normocephalic and atraumatic.  Eyes:     Conjunctiva/sclera: Conjunctivae normal.  Cardiovascular:     Rate and Rhythm: Normal rate and regular rhythm.     Heart sounds: No murmur heard. Pulmonary:     Effort: Pulmonary effort is normal. No respiratory distress.     Breath sounds: Normal breath sounds.  Abdominal:     Palpations: Abdomen is soft.     Tenderness: There is no abdominal tenderness.  Musculoskeletal:        General: No swelling.     Cervical back: Neck supple.  Skin:    General: Skin is warm and dry.     Capillary Refill: Capillary refill takes less than 2 seconds.  Neurological:     Mental Status: She is alert.     Sensory: No sensory deficit.     Motor: No weakness.     Comments: Dysarthria  Psychiatric:        Mood and Affect: Mood normal.    ED Results and Treatments Labs (all labs ordered are listed, but only abnormal results are displayed) Labs Reviewed  CBC - Abnormal; Notable for the following components:      Result Value   WBC 10.9 (*)    All other components within normal limits  DIFFERENTIAL - Abnormal; Notable for the following components:   Neutro Abs 9.3 (*)    All other components within normal limits  I-STAT CHEM 8, ED - Abnormal; Notable for the following components:   BUN 6 (*)    Creatinine, Ser 0.30 (*)    Glucose, Bld 147 (*)    Calcium, Ion 1.03 (*)    Hemoglobin 15.3 (*)    All other components within normal limits  PROTIME-INR  APTT  COMPREHENSIVE METABOLIC PANEL  LIPASE, BLOOD  CBG MONITORING, ED                                                                                                                           Radiology CT ABDOMEN PELVIS WO CONTRAST  Result Date: 09/25/2021 CLINICAL DATA:  abdominal distension.  Vomiting. EXAM: CT ABDOMEN AND PELVIS WITHOUT CONTRAST TECHNIQUE: Multidetector CT imaging of the abdomen and pelvis was performed following the standard protocol without IV contrast. RADIATION DOSE REDUCTION: This exam was performed according to the departmental dose-optimization program which includes automated exposure control, adjustment of the mA and/or kV according to patient size and/or use of iterative reconstruction technique. COMPARISON:  07/30/2021 FINDINGS: Lower chest: Motion degraded evaluation of the lung bases. Bibasilar scarring cardiomegaly, with multivessel coronary artery calcification. Small  hiatal hernia. Hepatobiliary: Hepatic cysts. Gallstones up to 1.0 cm without acute cholecystitis or biliary duct dilatation. Pancreas: Normal pancreas for age. Spleen: Normal in size, without focal abnormality. Adrenals/Urinary Tract: Mild left adrenal thickening. Normal right adrenal gland. Mildly scarred left kidney. No hydronephrosis. Contrast within renal collecting systems and bladder from today's CTA. Pelvic floor laxity with small cystocele. Stomach/Bowel: Normal remainder of the stomach. Extensive colonic diverticulosis. Motion degradation in the upper abdomen. Normal terminal ileum and appendix. Normal small bowel. Vascular/Lymphatic: Aortic atherosclerosis. No abdominopelvic adenopathy. Reproductive: Hysterectomy.  No adnexal mass. Other: No significant free fluid.  No free intraperitoneal air. Musculoskeletal: Osteopenia. Trace L4-5 anterolisthesis. Lumbosacral spondylosis. S shaped thoracolumbar spine curvature. IMPRESSION: 1.  No acute process in the abdomen or pelvis. 2. Cholelithiasis. 3. Pelvic floor laxity with cystocele. 4. Coronary artery atherosclerosis. Aortic Atherosclerosis  (ICD10-I70.0). 5.  Tiny hiatal hernia. Electronically Signed   By: Abigail Miyamoto M.D.   On: 09/25/2021 18:04   CT HEAD CODE STROKE WO CONTRAST  Result Date: 09/25/2021 CLINICAL DATA:  Code stroke.  Neuro deficit, acute, stroke suspected EXAM: CT HEAD WITHOUT CONTRAST TECHNIQUE: Contiguous axial images were obtained from the base of the skull through the vertex without intravenous contrast. RADIATION DOSE REDUCTION: This exam was performed according to the departmental dose-optimization program which includes automated exposure control, adjustment of the mA and/or kV according to patient size and/or use of iterative reconstruction technique. COMPARISON:  07/30/2021 FINDINGS: Brain: There is no acute intracranial hemorrhage mass effect. There are new bilateral cerebellar infarcts. Several chronic infarcts bilateral cerebral hemispheres as seen previously. Superimposed confluent areas of low-density in the supratentorial white matter probably reflect marked chronic microvascular ischemic changes. Prominence of the ventricles and sulci reflects stable parenchymal volume loss. No extra-axial collection. Vascular: There is intracranial atherosclerotic calcification at the skull base. No hyperdense vessel. Skull: No acute abnormality. Sinuses/Orbits: Bilateral lens replacements. Left maxillary sinus polypoid mucosal thickening. Other: Mastoid air cells are clear. IMPRESSION: No acute intracranial hemorrhage. New age-indeterminate bilateral cerebellar infarcts. Multiple chronic infarcts and advanced chronic microvascular ischemic changes as seen previously. Initial results were communicated to Dr. Rory Percy at 3:50 pm on 09/25/2021 by text page via the [facility] messaging system. Electronically Signed   By: Macy Mis M.D.   On: 09/25/2021 15:53   CT ANGIO HEAD NECK W WO CM (CODE STROKE)  Result Date: 09/25/2021 CLINICAL DATA:  Neuro deficit, acute, stroke suspected EXAM: CT ANGIOGRAPHY HEAD AND NECK TECHNIQUE:  Multidetector CT imaging of the head and neck was performed using the standard protocol during bolus administration of intravenous contrast. Multiplanar CT image reconstructions and MIPs were obtained to evaluate the vascular anatomy. Carotid stenosis measurements (when applicable) are obtained utilizing NASCET criteria, using the distal internal carotid diameter as the denominator. RADIATION DOSE REDUCTION: This exam was performed according to the departmental dose-optimization program which includes automated exposure control, adjustment of the mA and/or kV according to patient size and/or use of iterative reconstruction technique. CONTRAST:  20mL OMNIPAQUE IOHEXOL 350 MG/ML SOLN COMPARISON:  None. FINDINGS: CTA NECK Aortic arch: Mixed but primarily calcified plaque along the arch. There is high-grade stenosis and no visible contrast opacification of proximal left subclavian. Reconstitution is present prior to vertebral origin. Right carotid system: Patent. Calcified plaque along the common carotid with minimal stenosis. Calcified plaque along the proximal internal carotid with less than 50% stenosis. Left carotid system: Patent. Mixed plaque along the common carotid with less than 50% stenosis. Calcified plaque along the proximal internal carotid  with minimal stenosis. Vertebral arteries: Plaque at the left vertebral origin with possible marked stenosis. 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 There is plaque at the lower C2 level causing marked stenosis. The vessel again returns to a more normal caliber at the C1 level. Skeleton: Degenerative changes of the cervical spine. Other neck: Fatty atrophy of the right submandibular gland. Upper chest: No apical lung mass. Review of the MIP images confirms the above findings CTA HEAD Anterior circulation: Intracranial internal carotid arteries are patent with calcified plaque. There is mild stenosis  primarily in the supraclinoid portions. Anterior and middle cerebral arteries are patent. There is mild stenosis of the left M1 MCA. Mild atherosclerotic irregularity distal MCA branches bilaterally. Posterior circulation: Intracranial vertebral arteries are patent with calcified plaque causing mild stenosis. A right PICA is present and patent. Left PICA is not identified. Probable small left AICA. Basilar is patent with mild calcified plaque. Posterior cerebral arteries are patent with mild atherosclerotic irregularity. Small left posterior communicating artery. Venous sinuses: Patent as allowed by contrast bolus timing. 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 Subsequent plaque causes marked stenosis with the vessel returning to a more normal caliber at the C1 level. Intracranial vertebral arteries and basilar artery are patent without significant stenosis. Right PICA is patent. Probable small left diseased AICA. High-grade stenosis and visually occluded proximal left subclavian artery with reconstitution prior to vertebral origin. Plaque at the ICA origins causes less than 50% stenosis. Preliminary results discussed with Dr. Rory Percy on 09/25/2021 at 4 p.m. Electronically Signed   By: Macy Mis M.D.   On: 09/25/2021 16:13    Pertinent labs & imaging results that were available during my care of the patient were reviewed by me and considered in my medical decision making (see MDM for details).  Medications Ordered in ED Medications  sodium chloride flush (NS) 0.9 % injection 3 mL (has no administration in time range)  iohexol (OMNIPAQUE) 350 MG/ML injection 75 mL (75 mLs Intravenous Contrast Given 09/25/21 1558)  lactated ringers bolus 1,000 mL (1,000 mLs Intravenous New Bag/Given 09/25/21 1624)                                                                                                                                      Procedures .Critical Care Performed by: Teressa Lower, MD Authorized by: Teressa Lower, MD   Critical care provider statement:    Critical care time (minutes):  30   Critical care was time spent personally by me on the following activities:  Development of treatment plan with patient or surrogate, discussions with consultants, evaluation of patient's response to treatment, examination of patient, ordering and review of laboratory studies, ordering and review of radiographic studies, ordering and performing treatments and interventions, pulse oximetry, re-evaluation of patient's condition  and review of old charts  (including critical care time)  Medical Decision Making / ED Course   This patient presents to the ED for concern of nausea, vomiting, dysarthria, this involves an extensive number of treatment options, and is a complaint that carries with it a high risk of complications and morbidity.  The differential diagnosis includes CVA, intra-abdominal infection, viral gastroenteritis, aspiration pneumonia  MDM: Patient presents emergency department as a stroke alert due to concern for dysarthria and nausea and vomiting.  Physical exam reveals dysarthria but no appreciable focal motor or sensory deficits.  Laboratory evaluation largely unremarkable, urinalysis unremarkable.  CBC with a mild leukocytosis to 10.9.  Chest x-ray unremarkable.  CT abdomen pelvis unremarkable.  CT head and angio concerning for possible new cerebellar infarct.  MRI obtained that confirms acute infarcts in the right cerebellum, left parietal cortex concerning for embolic pathology.  Neurology consulted who recommended medical admission.  Patient then admitted to medicine.   Additional history obtained: -Additional history obtained from daughter -External records from outside source obtained and reviewed including: Chart review including previous notes, labs, imaging, consultation notes   Lab Tests: -I  ordered, reviewed, and interpreted labs.   The pertinent results include:   Labs Reviewed  CBC - Abnormal; Notable for the following components:      Result Value   WBC 10.9 (*)    All other components within normal limits  DIFFERENTIAL - Abnormal; Notable for the following components:   Neutro Abs 9.3 (*)    All other components within normal limits  I-STAT CHEM 8, ED - Abnormal; Notable for the following components:   BUN 6 (*)    Creatinine, Ser 0.30 (*)    Glucose, Bld 147 (*)    Calcium, Ion 1.03 (*)    Hemoglobin 15.3 (*)    All other components within normal limits  PROTIME-INR  APTT  COMPREHENSIVE METABOLIC PANEL  LIPASE, BLOOD  CBG MONITORING, ED      Imaging Studies ordered: I ordered imaging studies including CT head, CT angio brain neck. MRI brain, CT abdomen pelvis, CXR I independently visualized and interpreted imaging. I agree with the radiologist interpretation   Medicines ordered and prescription drug management: Meds ordered this encounter  Medications   sodium chloride flush (NS) 0.9 % injection 3 mL   iohexol (OMNIPAQUE) 350 MG/ML injection 75 mL   lactated ringers bolus 1,000 mL    -I have reviewed the patients home medicines and have made adjustments as needed  Critical interventions Management of acute stroke  Consultations Obtained: I requested consultation with the neurologist,  and discussed lab and imaging findings as well as pertinent plan - they recommend: Medical admission for completion of stroke work-up   Cardiac Monitoring: The patient was maintained on a cardiac monitor.  I personally viewed and interpreted the cardiac monitored which showed an underlying rhythm of: Normal sinus rhythm  Social Determinants of Health:  Factors impacting patients care include: Underlying dementia living in a nursing home   Reevaluation: After the interventions noted above, I reevaluated the patient and found that they have :stayed the same  Co  morbidities that complicate the patient evaluation  Past Medical History:  Diagnosis Date   Adenomatous polyps    AF (atrial fibrillation) ([place])    Cataract    Gallstones    Hx of adenomatous colonic polyps    Hyperlipidemia    x5 years   Hypertension    x5 years   Kyphosis  Metabolic syndrome X    Obesity    Osteoporosis    Stroke [facility]) 07/2017   Subdural hematoma    Symptomatic menopausal or female climacteric states    Vitamin D deficiency       Dispostion: I considered admission for this patient, and due to the new embolic strokes despite being anticoagulated will require medical admission.     Final Clinical Impression(s) / ED Diagnoses Final diagnoses:  None     @PCDICTATION @    Betul Brisky, Debe Coder, MD 09/25/21 2210

## 2021-09-25 NOTE — Code Documentation (Signed)
Stroke Response Nurse Documentation Code Documentation  Kari [name] is a 88 y.o. female arriving to [facility] ED via  [facility]  on 09/25/2021 with past medical hx of Afib, HTN, HLD, Embolic Stroke 6067, Dementia. On Eliquis (apixaban) daily with last dose 1/23 0900. Code stroke was activated by EMS.   Patient from home with family where she was LKW 1300. Patient then started complaining of abdominal pain and began vomiting, increased confusion and had slurred speech. Family called EMS.  En route BP noted to be 210/116.   Stroke team at the bedside on patient arrival. Patient cleared for CT by Dr. Vanita Panda. Labs/CBG unable to be drawn as pt extremities extremely cold. Patient to CT with team. NIHSS 14, see documentation for details and code stroke times. Patient with decreased LOC, disoriented, not following commands, bilateral arm weakness, bilateral leg weakness, Global aphasia , and dysarthria  on exam.  The following imaging was completed: CT, CTA head and neck. Patient is not a candidate for IV Thrombolytic due to on Eliquis. Patient is not a candidate for IR due to elevated mRS.   Care/Plan: MRI, SBP  <180 until MRI (if MRI shows stroke then permissive hypertension--verify with MD at that time), q2h NIHSS.  Bedside handoff with ED RN Barnetta Chapel.    Candace Cruise K  Stroke Response RN

## 2021-09-25 NOTE — Assessment & Plan Note (Addendum)
Eliquis currently on hold.

## 2021-09-25 NOTE — Assessment & Plan Note (Addendum)
Allow permissive hypertension.  Monitor blood pressures closely.

## 2021-09-25 NOTE — Subjective & Objective (Signed)
Chief complaint: Weakness, difficulty speaking History of present illness: 88 year old Caucasian female with a history of chronic A. fib on anticoagulation with Eliquis, hypertension, history of strokes, hyperlipidemia, hypertension presents to the ER today with a acute change in mental status and weakness today.  This happened approximately 12 to 1:00 PM.  Daughter states that she and the patient were out shopping.  Patient was unable to get back into the minivan due to weakness in her leg.  Daughter states the patient's voice got very very quiet.  She thought the patient may have had a stroke and brought the patient to the ER.  On arrival to the ER, blood pressure 184/90.  Code stroke called.  Patient seen by stroke neurologist.  tPA not given due to the patient being on full anticoagulation.  Initial head CT's were negative for acute stroke.  MRI of the brain demonstrated acute CVA of the right medial superior cerebellar hemisphere and the left parietal cortex.  Neurology has made several recommendations including stopping systemic anticoagulation for now.  [facility] hospitalist contacted for admission.

## 2021-09-25 NOTE — Assessment & Plan Note (Addendum)
Chronic A. fib.    Rate is reasonably well controlled.  Noted to be on atenolol at home which will be resumed to maintain heart rate in the normal range.   Anticoagulation currently on hold.  Patient was on Eliquis.  Previously on Coumadin on which she had embolic stroke and was transitioned to Eliquis several years ago.

## 2021-09-25 NOTE — Consult Note (Signed)
Neurology Consultation  Reason for Consult: Code Stroke Referring Physician: Dr. Matilde Sprang  CC: Patient is unable to provide a chief complaint due to her mental status  History is obtained from: Chart review, patient's daughter via telephone, unable to obtain from patient due to patient's mental status  HPI: Kari [name] is a 88 y.o. female with a medical history significant for dementia, atrial fibrillation on Eliquis, hyperlipidemia, essential hypertension, obesity with a BMI of 29.92 kg/m, stroke in 2018, subdural hematoma, and vitamin D deficiency who presented to the ED for evaluation of sudden onset of dysarthria, vomiting, complaints of abdominal pain and dysarthria. Her family states that it is not unusual for the patient to be drowsy this time of day so they did not have increased concerns until Kari [name] complained of abdominal pain and began to vomit with subsequent EMS activation.  At baseline, Kari [name] requires assistance with all of her ADLs including dressing, feeding, and bathing. She lives at home with her daughter and she is able to walk with a cane or a walker and one person assistance.   LKW: 13:00 TNK given?: no, patient on full dose anticoagulation with Eliquis, last dose 09:00 IR Thrombectomy? No, CT angio imaging is without acute LVO Modified Rankin Scale: 4-Needs assistance to walk and tend to bodily needs  ROS: Unable to obtain due to altered mental status.   Past Medical History:  Diagnosis Date   Adenomatous polyps    AF (atrial fibrillation) (HCC)    Cataract    Gallstones    Hx of adenomatous colonic polyps    Hyperlipidemia    x5 years   Hypertension    x5 years   Kyphosis    Metabolic syndrome X    Obesity    Osteoporosis    Stroke ([place]) 07/2017   Subdural hematoma    Symptomatic menopausal or female climacteric states    Vitamin D deficiency    Past Surgical History:  Procedure Laterality Date   ABDOMINAL HYSTERECTOMY     BLADDER SUSPENSION      COLONOSCOPY  multiple   ESOPHAGEAL DILATION N/A 03/03/2018   Procedure: ESOPHAGEAL DILATION;  Surgeon: Rogene Houston, MD;  Location: [facility];  Service: Endoscopy;  Laterality: N/A;   ESOPHAGOGASTRODUODENOSCOPY N/A 03/03/2018   Procedure: ESOPHAGOGASTRODUODENOSCOPY (EGD);  Surgeon: Rogene Houston, MD;  Location: [facility];  Service: Endoscopy;  Laterality: N/A;   EYE SURGERY Bilateral 09/2015   VAGINAL HYSTERECTOMY     total   Family History  Problem Relation Age of Onset   Heart attack Mother    Stroke Mother    Heart attack Father    Alcohol abuse Father    Hypertension Sister    Stroke Sister    Stroke Brother    Hypertension Brother    Heart attack Brother    Hypertension Brother    Depression Brother        suicide   Hypertension Sister    Heart attack Other    Stroke Other    Breast cancer Other    Colon cancer Neg Hx    Social History:   reports that she has never smoked. She has never used smokeless tobacco. She reports that she does not drink alcohol and does not use drugs.  Medications  Current Facility-Administered Medications:    lactated ringers bolus 1,000 mL, 1,000 mL, Intravenous, Once, Kommor, Madison, MD   sodium chloride flush (NS) 0.9 % injection 3 mL, 3 mL, Intravenous, Once,  Kommor, Madison, MD  Current Outpatient Medications:    apixaban (ELIQUIS) 5 MG TABS tablet, Take 1 tablet (5 mg total) by mouth 2 (two) times daily., Disp: 28 tablet, Rfl: 0   atenolol (TENORMIN) 50 MG tablet, TAKE 1 TABLET DAILY (Patient taking differently: Take 50 mg by mouth daily.), Disp: 90 tablet, Rfl: 1   Benfotiamine 150 MG CAPS, Take 150 mg by mouth daily., Disp: , Rfl:    captopril (CAPOTEN) 50 MG tablet, TAKE 1 TABLET BY MOUTH 2 TIMES DAILY FOR HIGH BLOOD PRESSURE. (Patient taking differently: Take 50 mg by mouth 2 (two) times daily.), Disp: 180 tablet, Rfl: 3   ezetimibe (ZETIA) 10 MG tablet, TAKE 1 TABLET ONCE DAILY FOR CHOLESTEROL (Patient taking  differently: Take 10 mg by mouth daily.), Disp: 90 tablet, Rfl: 3   fluconazole (DIFLUCAN) 150 MG tablet, Take one tablet by mouth now. Repeat dose in 7 days. (Patient not taking: Reported on 08/30/2021), Disp: 2 tablet, Rfl: 0   loratadine (CLARITIN) 10 MG tablet, Take 1 tablet (10 mg total) by mouth daily., Disp: 30 tablet, Rfl: 11   Multiple Vitamins-Minerals (ZINC PO), Take by mouth., Disp: , Rfl:    potassium chloride (KLOR-CON) 10 MEQ tablet, TAKE 1 TABLET DAILY (Patient taking differently: Take 10 mEq by mouth daily.), Disp: 90 tablet, Rfl: 0   pravastatin (PRAVACHOL) 80 MG tablet, TAKE 1 TABLET ONCE DAILY FOR CHOLESTEROL (Patient taking differently: Take 80 mg by mouth daily.), Disp: 90 tablet, Rfl: 3   rivastigmine (EXELON) 4.6 mg/24hr, Place 1 patch (4.6 mg total) onto the skin daily., Disp: 30 patch, Rfl: 12   vitamin C (ASCORBIC ACID) 500 MG tablet, Take 500 mg by mouth daily., Disp: , Rfl:   Exam: Current vital signs: BP (!) 108/91 (BP Location: Left Arm)    Pulse (!) 106    Temp 97.7 F (36.5 C) (Oral)    Resp (!) 21    Wt 74.2 kg    SpO2 94%    BMI 29.92 kg/m  Vital signs in last 24 hours: BP: (184)/(90) 184/90 (01/23 1540) SpO2:  [98 %] 98 % (01/23 1540) Weight:  [74.2 kg] 74.2 kg (01/23 1541)  GENERAL: Awake, alert, in no acute distress Psych: Unable to assess due to patient's condition  Head: Normocephalic and atraumatic, without obvious abnormality EENT: Normal conjunctivae, dry mucous membranes, no OP obstruction LUNGS: Normal respiratory effort. Non-labored breathing on room air CV: Irregular rate with tachycardia and irregular rhythm ABDOMEN: Soft, non-tender, non-distended Extremities: warm, well perfused, without obvious deformity  NEURO:  Mental Status: Drowsy throughout examination. Patient does respond to voice.  She is severely dysarthric and is intermittently able to state her name.  She states after multiple attempts that it is January but is unable to  correctly state the year. When asked her current age, it sounds like she states "38" but it is unclear.  She intermittently follows simple commands with repeat instruction She is unable to provide a clear and coherent history of present illness. She names "glasses" correctly but does not attempt to further identify objects. She does not repeat phrases.  No neglect is noted Cranial Nerves:  II: PERRL.  III, IV, VI: Patient will attend to stimuli in lateral eye fields.  V: Patient inconsistently blinks to threat throughout VII: Face is symmetric resting and smiling. VIII: Hearing is intact to voice IX, X: Palate elevation is symmetric. Phonation normal.  XI: Head is grossly midline XII: Tongue protrudes midline without fasciculations.  Motor: Strength assessment varies throughout assessment.  Initially, each extremity elevates antigravity with drift to bed without asymmetry.  On reassessment following CT head imaging, patient is able to elevate each extremity antigravity without vertical drift with constant coaching.  Tone is normal. Bulk is normal.  Sensation: Grimaces with light application of noxious stimuli throughout Coordination: Does not perform DTRs: 2+ and symmetric biceps and patellae Gait: Deferred for patient's safety  NIHSS @ 15:45: 1a Level of Conscious.: 1 1b LOC Questions: 1 1c LOC Commands: 1 2 Best Gaze: 0 3 Visual: 0 4 Facial Palsy: 0 5a Motor Arm - left: 2 5b Motor Arm - Right: 2 6a Motor Leg - Left: 2 6b Motor Leg - Right: 2 7 Limb Ataxia: 0 8 Sensory: 0 9 Best Language: 1 10 Dysarthria: 2 11 Extinct. and Inatten.: 0 TOTAL: 14  Labs I have reviewed labs in epic and the results pertinent to this consultation are: CBC    Component Value Date/Time   WBC 11.4 (H) 07/30/2021 1017   RBC 5.30 (H) 07/30/2021 1017   HGB 15.5 (H) 07/30/2021 1017   HGB 14.8 05/09/2021 1100   HCT 50.5 (H) 07/30/2021 1017   HCT 45.6 05/09/2021 1100   PLT 219 07/30/2021 1017    PLT 214 05/09/2021 1100   MCV 95.3 07/30/2021 1017   MCV 92 05/09/2021 1100   MCH 29.2 07/30/2021 1017   MCHC 30.7 07/30/2021 1017   RDW 14.3 07/30/2021 1017   RDW 13.5 05/09/2021 1100   LYMPHSABS 1.4 05/09/2021 1100   MONOABS 0.5 11/10/2018 1335   EOSABS 0.2 05/09/2021 1100   BASOSABS 0.0 05/09/2021 1100   CMP     Component Value Date/Time   NA 136 07/30/2021 1017   NA 140 05/09/2021 1100   K 3.6 07/30/2021 1017   CL 102 07/30/2021 1017   CO2 23 07/30/2021 1017   GLUCOSE 151 (H) 07/30/2021 1017   BUN 6 (L) 07/30/2021 1017   BUN 9 05/09/2021 1100   CREATININE 0.63 07/30/2021 1017   CREATININE 0.71 03/25/2013 0938   CALCIUM 9.3 07/30/2021 1017   PROT 7.3 07/30/2021 1017   PROT 6.8 05/09/2021 1100   ALBUMIN 3.8 07/30/2021 1017   ALBUMIN 3.8 05/09/2021 1100   AST 28 07/30/2021 1017   ALT 17 07/30/2021 1017   ALKPHOS 69 07/30/2021 1017   BILITOT 1.0 07/30/2021 1017   BILITOT 0.6 05/09/2021 1100   GFRNONAA >60 07/30/2021 1017   GFRNONAA 83 03/25/2013 0938   GFRAA 97 06/07/2020 1520   GFRAA >89 03/25/2013 0938   Lipid Panel     Component Value Date/Time   CHOL 136 02/03/2019 1425   CHOL 123 03/25/2013 0938   TRIG 161 (H) 02/03/2019 1425   TRIG 93 01/04/2016 0836   TRIG 104 03/25/2013 0938   HDL 41 02/03/2019 1425   HDL 39 (L) 01/04/2016 0836   HDL 41 03/25/2013 0938   CHOLHDL 3.3 02/03/2019 1425   LDLCALC 63 02/03/2019 1425   LDLCALC 68 01/06/2014 0940   LDLCALC 61 03/25/2013 0938   Lab Results  Component Value Date   HGBA1C 6.0 02/08/2016   Imaging I have reviewed the images obtained:  CT-scan of the brain 1/23: - No acute intracranial hemorrhage. New age-indeterminate bilateral cerebellar infarcts. - Multiple chronic infarcts and advanced chronic microvascular ischemic changes as seen previously.  CT angio head and neck wwo 1/23: - Chronic right vertebral occlusion with reconstitution at C2-C3. - Subsequent plaque causes marked stenosis with the  vessel  returning to a more normal caliber at the C1 level.  - Intracranial vertebral arteries and basilar artery are patent without significant stenosis. Right PICA is patent. Probable small left diseased AICA. - High-grade stenosis and visually occluded proximal left subclavian artery with reconstitution prior to vertebral origin. - Plaque at the ICA origins causes less than 50% stenosis.  Assessment: 88 year old female with a past medical history as above who presented to the ED on 1/23 as a code stroke for evaluation of acute onset dysarthria, drowsiness, abdominal pain, and vomiting. - Examination is variable throughout assessment with generalized weakness without unilateral weakness or asymmetry, severe dysarthria, and disorientation (baseline with dementia). Her NIHSS is a 14.  - CT imaging reveals age-indeterminate bilateral cerebellar infarcts new since 07/30/2021 and multiple chronic infarcts. CT angiography imaging reveals a chronic right vertebral occlusion since her stroke in 2018 and high-grade stenosis and visually occluded proximal left subclavian artery with reconstitution prior to vertebral origin. - Patient is not a TNKase candidate due to being on full dose anticoagulation with Eliquis at home with her last dose at 09:00 this morning. She does not have an acute LVO on examination and her baseline functional status is poor excluding her from the possibility of IR for thrombectomy.  - Presentation may be due to toxic-metabolic encephalopathy in the setting of acute illness / GI illness versus acute stroke. Cannot rule out seizure at this time, though feel this is less likely with decreased alertness throughout exam. Will obtain MRI brain for further evaluation.   Recommendations: - MRI brain without contrast - CBC, BMP, Abd xray, CXR - Maintain SBP < 180 mmHg for now, if acute stroke on MRI will liberalize blood pressure goal at that time - Please call neurology if MRI brain reveals  acute findings and will expand stroke work up at that time.  - Further work up and management of toxic-metabolic derangements per ED / primary team as you are - Will consider EEG if above remains unremarkable.  Pt seen by NP/Neuro and later by MD. Note/plan to be edited by MD as needed.  Anibal Henderson, AGAC-NP [facility] Pager: [phone]   Attending Neurohospitalist Addendum Patient seen and examined with APP/Resident. Agree with the history and physical as documented above. Agree with the plan as documented, which I helped formulate. I have independently reviewed the chart, obtained history, review of systems and examined the patient.I have personally reviewed pertinent head/neck/spine imaging (CT/MRI). Plan d/w Dr Matilde Sprang Please feel free to call with any questions.  -- Amie Portland, MD Neurologist [facility] Pager: [phone]

## 2021-09-25 NOTE — Progress Notes (Signed)
MRI reveals acute strokes, multifocal. I would hold anticoagulation for now, and bridge with ASA 325mg  daily. Also, she will need an echo, lipid panel, a1c and PT/OT/ST.  Stroke team to follow.   Roland Rack, MD [facility] [phone]  If 7pm- 7am, please page neurology on call as listed in [place].

## 2021-09-25 NOTE — Assessment & Plan Note (Addendum)
Continue Zetia and Pravachol.

## 2021-09-26 ENCOUNTER — Inpatient Hospital Stay (HOSPITAL_COMMUNITY): Payer: Medicare Other

## 2021-09-26 ENCOUNTER — Ambulatory Visit: Payer: Medicare Other | Admitting: Family Medicine

## 2021-09-26 ENCOUNTER — Other Ambulatory Visit: Payer: Self-pay

## 2021-09-26 DIAGNOSIS — E782 Mixed hyperlipidemia: Secondary | ICD-10-CM

## 2021-09-26 DIAGNOSIS — I6389 Other cerebral infarction: Secondary | ICD-10-CM

## 2021-09-26 LAB — ECHOCARDIOGRAM COMPLETE
AR max vel: 1.9 cm2
AV Area VTI: 1.75 cm2
AV Area mean vel: 1.87 cm2
AV Mean grad: 2 mmHg
AV Peak grad: 4.2 mmHg
Ao pk vel: 1.02 m/s
Area-P 1/2: 4.1 cm2
S' Lateral: 3 cm
Weight: 2617.3 oz

## 2021-09-26 LAB — RESP PANEL BY RT-PCR (FLU A&B, COVID) ARPGX2
Influenza A by PCR: NEGATIVE
Influenza B by PCR: NEGATIVE
SARS Coronavirus 2 by RT PCR: NEGATIVE

## 2021-09-26 LAB — LIPID PANEL
Cholesterol: 113 mg/dL (ref 0–200)
HDL: 37 mg/dL — ABNORMAL LOW (ref >40)
LDL Cholesterol: 61 mg/dL (ref 0–99)
Total CHOL/HDL Ratio: 3.1 RATIO
Triglycerides: 75 mg/dL (ref <150)
VLDL: 15 mg/dL (ref 0–40)

## 2021-09-26 LAB — HEMOGLOBIN A1C
Hgb A1c MFr Bld: 6 % — ABNORMAL HIGH (ref 4.8–5.6)
Mean Plasma Glucose: 126 mg/dL

## 2021-09-26 MED ORDER — RIVASTIGMINE 4.6 MG/24HR TD PT24: 4.6000 mg | MEDICATED_PATCH | Freq: Every day | TRANSDERMAL | Status: DC

## 2021-09-26 MED ORDER — METOPROLOL TARTRATE 5 MG/5ML IV SOLN: 2.5000 mg | Freq: Four times a day (QID) | INTRAVENOUS | Status: DC | PRN

## 2021-09-26 MED ORDER — ACETAMINOPHEN 325 MG PO TABS
650.0000 mg | ORAL_TABLET | ORAL | Status: DC | PRN
  Filled 2021-09-26: qty 2

## 2021-09-26 MED ORDER — PRAVASTATIN SODIUM 40 MG PO TABS
80.0000 mg | ORAL_TABLET | Freq: Every day | ORAL | Status: DC
  Administered 2021-09-26 – 2021-09-27 (×2): 80 mg via ORAL
  Filled 2021-09-26 (×2): qty 2

## 2021-09-26 MED ORDER — ASPIRIN 325 MG PO TABS
325.0000 mg | ORAL_TABLET | Freq: Every day | ORAL | Status: DC
  Administered 2021-09-26 – 2021-09-27 (×2): 325 mg via ORAL
  Filled 2021-09-26 (×2): qty 1

## 2021-09-26 MED ORDER — ACETAMINOPHEN 160 MG/5ML PO SOLN: 650.0000 mg | ORAL | Status: DC | PRN

## 2021-09-26 MED ORDER — STROKE: EARLY STAGES OF RECOVERY BOOK
Freq: Once | Status: AC
  Filled 2021-09-26: qty 1

## 2021-09-26 MED ORDER — ATENOLOL 25 MG PO TABS
25.0000 mg | ORAL_TABLET | Freq: Every day | ORAL | Status: DC
  Administered 2021-09-26 – 2021-09-27 (×2): 25 mg via ORAL
  Filled 2021-09-26 (×2): qty 1

## 2021-09-26 MED ORDER — ASPIRIN 300 MG RE SUPP: 300.0000 mg | Freq: Every day | RECTAL | Status: DC

## 2021-09-26 MED ORDER — EZETIMIBE 10 MG PO TABS
10.0000 mg | ORAL_TABLET | Freq: Every day | ORAL | Status: DC
  Administered 2021-09-26 – 2021-09-27 (×2): 10 mg via ORAL
  Filled 2021-09-26 (×3): qty 1

## 2021-09-26 MED ORDER — ACETAMINOPHEN 650 MG RE SUPP
650.0000 mg | RECTAL | Status: DC | PRN
  Administered 2021-09-27: 01:00:00 650 mg via RECTAL
  Filled 2021-09-26: qty 1

## 2021-09-26 NOTE — Progress Notes (Signed)
[facility] PROGRESS NOTE   Kari [name] [id number]:[id number] DOB: March 07, 1934 DOA: 09/25/2021  1 DOS: the patient was seen and examined on 09/26/2021  PCP: [name], DO  Brief History and Hospital Course:  88 year old Caucasian female with a history of chronic A. fib on anticoagulation with Eliquis, hypertension, history of strokes, hyperlipidemia, hypertension presented to the ER with an acute change in mental status and weakness.  This happened approximately 12 to 1:00 PM.  Daughter states that she and the patient were out shopping.  Patient was unable to get back into the minivan due to weakness in her leg. Daughter states the patient's voice got very very quiet.  She thought the patient may have had a stroke and brought the patient to the ER.  Patient was seen by neurology.  Her anticoagulation was held.  MRI confirmed acute embolic infarcts.  Hospitalize for further management.  Consultants: Neurology  Procedures: Transthoracic echocardiogram is pending    Subjective: Patient somnolent.  Opens her eyes but goes right back to sleep.  Daughter is at bedside.    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   LDL 61.  Continue with statin. HbA1c is pending Echocardiogram is pending PT OT SLP evaluation. Stroke team to see. Patient was on Eliquis which has been held.  To be placed on aspirin. Patient was previously on Coumadin which was changed over to Eliquis after previous stroke.  Essential hypertension- (present on admission) Allow permissive hypertension.  Monitor blood pressures closely.    Atrial fibrillation ([place])- (present on admission) Chronic A. fib.    Rate is reasonably well controlled.  Noted to be on atenolol at home which will be resumed to maintain heart rate in the normal range.   Anticoagulation currently on hold.  Patient was on Eliquis.   Previously on Coumadin on which she had embolic stroke and was transitioned to Eliquis several years ago.    Hyperlipidemia- (present on admission) Continue Zetia and Pravachol.  Chronic anticoagulation - on eliquis for afib. had CVA while taking coumadin and therefore changed to Eliquis Eliquis currently on hold.      DVT Prophylaxis: Lovenox Code Status: Full code Family Communication: Discussed with daughter at bedside Disposition Plan: To be determined  Status is: Inpatient  Remains inpatient appropriate because: Acute stroke         Medications: Scheduled:  aspirin  300 mg Rectal Daily   Or   aspirin  325 mg Oral Daily   atenolol  25 mg Oral Daily   ezetimibe  10 mg Oral Daily   pravastatin  80 mg Oral Daily   sodium chloride flush  3 mL Intravenous Once   Continuous:  lactated ringers 50 mL/hr at 09/25/21 2251   KDT:OIZTIWPYKDXIP **OR** acetaminophen (TYLENOL) oral liquid 160 mg/5 mL **OR** acetaminophen, LORazepam, metoprolol tartrate  Antibiotics: Anti-infectives (From admission, onward)    None       Objective:  Vital Signs  Vitals:   09/26/21 0100 09/26/21 0308 09/26/21 0605 09/26/21 0749  BP: 103/84 101/67 (!) 126/94 (!) 132/107  Pulse: 89 71 84 71  Resp: (!) 21 16 16 20   Temp:    98.2 F (36.8 C)  TempSrc:    Oral  SpO2: 93% 95% 95% 99%  Weight:       No intake or output data in the 24 hours ending 09/26/21 0853 Filed Weights   09/25/21 1541  Weight: 74.2  kg    General appearance: Somnolent Resp: Clear to auscultation bilaterally.  Normal effort Cardio: S1-S2 is normal regular.  No S3-S4.  No rubs murmurs or bruit GI: Abdomen is soft.  Nontender nondistended.  Bowel sounds are present normal.  No masses organomegaly Extremities: No edema.  Full range of motion of lower extremities. Neurologic: Unable to assess neurological function since patient was sleepy   Lab Results:  Data Reviewed: I have personally reviewed labs and  imaging study reports  CBC: Recent Labs  Lab 09/25/21 1802 09/25/21 1803  WBC 10.9*  --   NEUTROABS 9.3*  --   HGB 14.4 15.3*  HCT 45.3 45.0  MCV 94.2  --   PLT 231  --     Basic Metabolic Panel: Recent Labs  Lab 09/25/21 1802 09/25/21 1803  NA 135 137  K 4.3 4.0  CL 105 102  CO2 22  --   GLUCOSE 146* 147*  BUN 7* 6*  CREATININE 0.51 0.30*  CALCIUM 8.5*  --     GFR: Estimated Creatinine Clearance: 48.5 mL/min (A) (by C-G formula based on SCr of 0.3 mg/dL (L)).  Liver Function Tests: Recent Labs  Lab 09/25/21 1802  AST 27  ALT 10  ALKPHOS 68  BILITOT 1.3*  PROT 6.6  ALBUMIN 3.4*    Recent Labs  Lab 09/25/21 1802  LIPASE 25    Coagulation Profile: Recent Labs  Lab 09/25/21 1802  INR 1.4*      Lipid Profile: Recent Labs    09/26/21 0312  CHOL 113  HDL 37*  LDLCALC 61  TRIG 75  CHOLHDL 3.1     Recent Results (from the past 240 hour(s))  Resp Panel by RT-PCR (Flu A&B, Covid) Nasopharyngeal Swab     Status: None   Collection Time: 09/26/21  7:55 AM   Specimen: Nasopharyngeal Swab; Nasopharyngeal(NP) swabs in vial transport medium  Result Value Ref Range Status   SARS Coronavirus 2 by RT PCR NEGATIVE NEGATIVE Final    Comment: (NOTE) SARS-CoV-2 target nucleic acids are NOT DETECTED.  The SARS-CoV-2 RNA is generally detectable in upper respiratory specimens during the acute phase of infection. The lowest concentration of SARS-CoV-2 viral copies this assay can detect is 138 copies/mL. A negative result does not preclude SARS-Cov-2 infection and should not be used as the sole basis for treatment or other patient management decisions. A negative result may occur with  improper specimen collection/handling, submission of specimen other than nasopharyngeal swab, presence of viral mutation(s) within the areas targeted by this assay, and inadequate number of viral copies(<138 copies/mL). A negative result must be combined with clinical  observations, patient history, and epidemiological information. The expected result is Negative.  Fact Sheet for Patients:  EntrepreneurPulse.com.au  Fact Sheet for Healthcare Providers:  IncredibleEmployment.be  This test is no t yet approved or cleared by the [place] FDA and  has been authorized for detection and/or diagnosis of SARS-CoV-2 by FDA under an Emergency Use Authorization (EUA). This EUA will remain  in effect (meaning this test can be used) for the duration of the COVID-19 declaration under Section 564(b)(1) of the Act, 21 U.S.C.section 360bbb-3(b)(1), unless the authorization is terminated  or revoked sooner.       Influenza A by PCR NEGATIVE NEGATIVE Final   Influenza B by PCR NEGATIVE NEGATIVE Final    Comment: (NOTE) The Xpert Xpress SARS-CoV-2/FLU/RSV plus assay is intended as an aid in the diagnosis of influenza from Nasopharyngeal swab specimens and should  not be used as a sole basis for treatment. Nasal washings and aspirates are unacceptable for Xpert Xpress SARS-CoV-2/FLU/RSV testing.  Fact Sheet for Patients: EntrepreneurPulse.com.au  Fact Sheet for Healthcare Providers: IncredibleEmployment.be  This test is not yet approved or cleared by the [place] FDA and has been authorized for detection and/or diagnosis of SARS-CoV-2 by FDA under an Emergency Use Authorization (EUA). This EUA will remain in effect (meaning this test can be used) for the duration of the COVID-19 declaration under Section 564(b)(1) of the Act, 21 U.S.C. section 360bbb-3(b)(1), unless the authorization is terminated or revoked.  Performed at [facility], [place] [address] [place], [place] [address]       Radiology Studies: CT ABDOMEN PELVIS WO CONTRAST  Result Date: 09/25/2021 CLINICAL DATA:  abdominal distension.  Vomiting. EXAM: CT ABDOMEN AND PELVIS WITHOUT CONTRAST TECHNIQUE:  Multidetector CT imaging of the abdomen and pelvis was performed following the standard protocol without IV contrast. RADIATION DOSE REDUCTION: This exam was performed according to the departmental dose-optimization program which includes automated exposure control, adjustment of the mA and/or kV according to patient size and/or use of iterative reconstruction technique. COMPARISON:  07/30/2021 FINDINGS: Lower chest: Motion degraded evaluation of the lung bases. Bibasilar scarring cardiomegaly, with multivessel coronary artery calcification. Small hiatal hernia. Hepatobiliary: Hepatic cysts. Gallstones up to 1.0 cm without acute cholecystitis or biliary duct dilatation. Pancreas: Normal pancreas for age. Spleen: Normal in size, without focal abnormality. Adrenals/Urinary Tract: Mild left adrenal thickening. Normal right adrenal gland. Mildly scarred left kidney. No hydronephrosis. Contrast within renal collecting systems and bladder from today's CTA. Pelvic floor laxity with small cystocele. Stomach/Bowel: Normal remainder of the stomach. Extensive colonic diverticulosis. Motion degradation in the upper abdomen. Normal terminal ileum and appendix. Normal small bowel. Vascular/Lymphatic: Aortic atherosclerosis. No abdominopelvic adenopathy. Reproductive: Hysterectomy.  No adnexal mass. Other: No significant free fluid.  No free intraperitoneal air. Musculoskeletal: Osteopenia. Trace L4-5 anterolisthesis. Lumbosacral spondylosis. S shaped thoracolumbar spine curvature. IMPRESSION: 1.  No acute process in the abdomen or pelvis. 2. Cholelithiasis. 3. Pelvic floor laxity with cystocele. 4. Coronary artery atherosclerosis. Aortic Atherosclerosis (ICD10-I70.0). 5.  Tiny hiatal hernia. Electronically Signed   By: Abigail Miyamoto M.D.   On: 09/25/2021 18:04   MR BRAIN WO CONTRAST  Result Date: 09/25/2021 CLINICAL DATA:  Stroke suspected EXAM: MRI HEAD WITHOUT CONTRAST TECHNIQUE: Multiplanar, multiecho pulse sequences of the  brain and surrounding structures were obtained without intravenous contrast. COMPARISON:  No prior MRI, correlation is made with same day CT and CTA FINDINGS: Evaluation is limited due to motion artifact and patient inability to cooperate. Brain: Moderate area of restricted diffusion with ADC correlate in the right medial superior cerebellar hemisphere, which measures up to 2.6 x 2.4 x 2.5 cm (AP x TR x CC) (series 3, image 18 and series 4, images 13 and 11). Additional area of restricted diffusion with ADC correlate in the left parietal cortex (series 3, image 34). No definite acute hemorrhage, mass, mass effect, or midline shift. More remote bilateral cerebellar infarcts. Dilated perivascular spaces in the bilateral basal ganglia. Confluent T2 hyperintense signal in the periventricular white matter, likely the sequela of severe chronic small vessel ischemic disease. No hydrocephalus or extra-axial collection. Vascular: Normal flow voids, within the aforementioned limitations. Skull and upper cervical spine: Normal marrow signal. Sinuses/Orbits: Left maxillary mucous retention cyst. Status post bilateral lens replacements. Other: None. IMPRESSION: Evaluation is significantly limited by motion artifact and patient noncooperation. Within this limitation, there are acute  infarcts in the right medial superior cerebellum and left parietal cortex. Given multiple vascular territories, consider an embolic etiology. These results were called by telephone at the time of interpretation on 09/25/2021 at 9:38 pm to provider [facility] , who verbally acknowledged these results. Electronically Signed   By: Merilyn Baba M.D.   On: 09/25/2021 21:38   DG Chest Portable 1 View  Result Date: 09/25/2021 CLINICAL DATA:  Rule out aspiration EXAM: PORTABLE CHEST 1 VIEW COMPARISON:  Chest x-ray dated July 20, 2021 FINDINGS: Cardiac and mediastinal contours are unchanged large and unchanged when compared with prior exam.  Bilateral interstitial opacities. No large pleural effusion or pneumothorax. IMPRESSION: Bilateral interstitial opacities, favor pulmonary edema. Electronically Signed   By: Yetta Glassman M.D.   On: 09/25/2021 19:09   CT HEAD CODE STROKE WO CONTRAST  Result Date: 09/25/2021 CLINICAL DATA:  Code stroke.  Neuro deficit, acute, stroke suspected EXAM: CT HEAD WITHOUT CONTRAST TECHNIQUE: Contiguous axial images were obtained from the base of the skull through the vertex without intravenous contrast. RADIATION DOSE REDUCTION: This exam was performed according to the departmental dose-optimization program which includes automated exposure control, adjustment of the mA and/or kV according to patient size and/or use of iterative reconstruction technique. COMPARISON:  07/30/2021 FINDINGS: Brain: There is no acute intracranial hemorrhage mass effect. There are new bilateral cerebellar infarcts. Several chronic infarcts bilateral cerebral hemispheres as seen previously. Superimposed confluent areas of low-density in the supratentorial white matter probably reflect marked chronic microvascular ischemic changes. Prominence of the ventricles and sulci reflects stable parenchymal volume loss. No extra-axial collection. Vascular: There is intracranial atherosclerotic calcification at the skull base. No hyperdense vessel. Skull: No acute abnormality. Sinuses/Orbits: Bilateral lens replacements. Left maxillary sinus polypoid mucosal thickening. Other: Mastoid air cells are clear. IMPRESSION: No acute intracranial hemorrhage. New age-indeterminate bilateral cerebellar infarcts. Multiple chronic infarcts and advanced chronic microvascular ischemic changes as seen previously. Initial results were communicated to Dr. Rory Percy at 3:50 pm on 09/25/2021 by text page via the [facility] messaging system. Electronically Signed   By: Macy Mis M.D.   On: 09/25/2021 15:53   CT ANGIO HEAD NECK W WO CM (CODE STROKE)  Result Date:  09/25/2021 CLINICAL DATA:  Neuro deficit, acute, stroke suspected EXAM: CT ANGIOGRAPHY HEAD AND NECK TECHNIQUE: Multidetector CT imaging of the head and neck was performed using the standard protocol during bolus administration of intravenous contrast. Multiplanar CT image reconstructions and MIPs were obtained to evaluate the vascular anatomy. Carotid stenosis measurements (when applicable) are obtained utilizing NASCET criteria, using the distal internal carotid diameter as the denominator. RADIATION DOSE REDUCTION: This exam was performed according to the departmental dose-optimization program which includes automated exposure control, adjustment of the mA and/or kV according to patient size and/or use of iterative reconstruction technique. CONTRAST:  42mL OMNIPAQUE IOHEXOL 350 MG/ML SOLN COMPARISON:  None. FINDINGS: CTA NECK Aortic arch: Mixed but primarily calcified plaque along the arch. There is high-grade stenosis and no visible contrast opacification of proximal left subclavian. Reconstitution is present prior to vertebral origin. Right carotid system: Patent. Calcified plaque along the common carotid with minimal stenosis. Calcified plaque along the proximal internal carotid with less than 50% stenosis. Left carotid system: Patent. Mixed plaque along the common carotid with less than 50% stenosis. Calcified plaque along the proximal internal carotid with minimal stenosis. Vertebral arteries: Plaque at the left vertebral origin with possible marked stenosis. There is chronic occlusion of the right vertebral from the origin with reconstitution at  the C2-C3 level. There is plaque at the lower C2 level causing marked stenosis. The vessel again returns to a more normal caliber at the C1 level. Skeleton: Degenerative changes of the cervical spine. Other neck: Fatty atrophy of the right submandibular gland. Upper chest: No apical lung mass. Review of the MIP images confirms the above findings CTA HEAD Anterior  circulation: Intracranial internal carotid arteries are patent with calcified plaque. There is mild stenosis primarily in the supraclinoid portions. Anterior and middle cerebral arteries are patent. There is mild stenosis of the left M1 MCA. Mild atherosclerotic irregularity distal MCA branches bilaterally. Posterior circulation: Intracranial vertebral arteries are patent with calcified plaque causing mild stenosis. A right PICA is present and patent. Left PICA is not identified. Probable small left AICA. Basilar is patent with mild calcified plaque. Posterior cerebral arteries are patent with mild atherosclerotic irregularity. Small left posterior communicating artery. Venous sinuses: Patent as allowed by contrast bolus timing. 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 Subsequent plaque causes marked stenosis with the vessel returning to a more normal caliber at the C1 level. Intracranial vertebral arteries and basilar artery are patent without significant stenosis. Right PICA is patent. Probable small left diseased AICA. High-grade stenosis and visually occluded proximal left subclavian artery with reconstitution prior to vertebral origin. Plaque at the ICA origins causes less than 50% stenosis. Preliminary results discussed with Dr. Rory Percy on 09/25/2021 at 4 p.m. Electronically Signed   By: Macy Mis M.D.   On: 09/25/2021 16:13       LOS: 1 day   [facility] Pager on [email or web address]  09/26/2021, 8:53 AM

## 2021-09-26 NOTE — ED Notes (Signed)
Echo in progress at bedside. Will administer crushed meds with applesauce per recommendation from SLP following echo.

## 2021-09-26 NOTE — Progress Notes (Signed)
Clinical/Bedside Swallow Evaluation Patient Details  Name: Kari [name] MRN: [id number] Date of Birth: 12/22/1933  Today's Date: 09/26/2021 Time: SLP Start Time (ACUTE ONLY): 15 SLP Stop Time (ACUTE ONLY): 1100 SLP Time Calculation (min) (ACUTE ONLY): 20 min  Past Medical History:  Past Medical History:  Diagnosis Date   Adenomatous polyps    AF (atrial fibrillation) (HCC)    Cataract    Gallstones    Hx of adenomatous colonic polyps    Hyperlipidemia    x5 years   Hypertension    x5 years   Kyphosis    Metabolic syndrome X    Obesity    Osteoporosis    Stroke ([place]) 07/2017   Subdural hematoma    Symptomatic menopausal or female climacteric states    Vitamin D deficiency    Past Surgical History:  Past Surgical History:  Procedure Laterality Date   ABDOMINAL HYSTERECTOMY     BLADDER SUSPENSION     COLONOSCOPY  multiple   ESOPHAGEAL DILATION N/A 03/03/2018   Procedure: ESOPHAGEAL DILATION;  Surgeon: Rogene Houston, MD;  Location: [facility];  Service: Endoscopy;  Laterality: N/A;   ESOPHAGOGASTRODUODENOSCOPY N/A 03/03/2018   Procedure: ESOPHAGOGASTRODUODENOSCOPY (EGD);  Surgeon: Rogene Houston, MD;  Location: [facility];  Service: Endoscopy;  Laterality: N/A;   EYE SURGERY Bilateral 09/2015   VAGINAL HYSTERECTOMY     total   HPI:  88yo female admitted 09/25/21 with AMS with weakness, difficulty speaking. PMH: AFib, HTN, CVAs, SDH, HLD. MRI = acute CVA of the right medial superior cerebellar hemisphere and the left parietal cortex.    Assessment / Plan / Recommendation  Clinical Impression  Pt seen at bedside to assess swallow function and safety, and to identify the least restrictive diet. Pt was awake and alert, family present. Pt has upper and lower dentures. CN exam is unremarkable. Pt accepted trials of ice chips, puree, thin liquid, solids, and nectar thick liquids. She required cues to begin chewing ice chip, but did not exhibit overt s/s aspiration after  the swallow. Cough response elicited after thin liquids via cup and straw with anterior leakage of some of the bolus. Nectar thick liquid, puree, and solid texture were tolerated without cough response or obvious difficulty. Pt did demonstrate extended oral prep of the cracker, possibly due to daughter's instruction to "chew it up good". At this time, will begin dys 2 (finely chopped) diet with thin liquids, meds crushed in puree. Safe swallow precautions posted at [facility]. SLP will continue to follow to assess tolerance of current diet and determine readiness for advanced textures or instrumental study.  SLP Visit Diagnosis: Dysphagia, unspecified (R13.10)    Aspiration Risk  Mild aspiration risk    Diet Recommendation Dysphagia 2 (Fine chop);Nectar-thick liquid   Liquid Administration via: Cup;Straw Medication Administration: Crushed with puree Supervision: Full supervision/cueing for compensatory strategies;Staff to assist with self feeding Compensations: Minimize environmental distractions;Slow rate;Small sips/bites Postural Changes: Seated upright at 90 degrees;Remain upright for at least 30 minutes after po intake    Other  Recommendations Oral Care Recommendations: Oral care BID Other Recommendations: Order thickener from pharmacy    Recommendations for follow up therapy are one component of a multi-disciplinary discharge planning process, led by the attending physician.  Recommendations may be updated based on patient status, additional functional criteria and insurance authorization.  Follow up Recommendations Other (comment) (TBD)      Assistance Recommended at Discharge Frequent or constant Supervision/Assistance  Functional Status Assessment Patient has  had a recent decline in their functional status and/or demonstrates limited ability to make significant improvements in function in a reasonable and predictable amount of time  Frequency and Duration min 1 x/week  1 week;2 weeks        Prognosis Prognosis for Safe Diet Advancement: Fair Barriers to Reach Goals: Cognitive deficits      Swallow Study   General Date of Onset: 09/25/21 HPI: 88yo female admitted 09/25/21 with AMS with weakness, difficulty speaking. PMH: AFib, HTN, CVAs, SDH, HLD. MRI = acute CVA of the right medial superior cerebellar hemisphere and the left parietal cortex. Type of Study: Bedside Swallow Evaluation Previous Swallow Assessment: MBS 11/12/27 = reg/thin, flash penetration x1 Diet Prior to this Study: NPO Temperature Spikes Noted: No Respiratory Status: Room air History of Recent Intubation: No Behavior/Cognition: Alert;Cooperative;Pleasant mood;Confused;Requires cueing Oral Cavity Assessment: Within Functional Limits Oral Care Completed by SLP: No Oral Cavity - Dentition: Dentures, top;Dentures, bottom Vision: Functional for self-feeding Self-Feeding Abilities: Needs assist;Needs set up Patient Positioning: Upright in bed Baseline Vocal Quality: Normal Volitional Cough: Strong Volitional Swallow: Able to elicit    Oral/Motor/Sensory Function Overall Oral Motor/Sensory Function: Within functional limits   Ice Chips Ice chips: Impaired Presentation: Spoon Oral Phase Impairments: Poor awareness of bolus;Impaired mastication (Pt required cues to begin chewing up ice chip)   Thin Liquid Thin Liquid: Impaired Presentation: Straw;Cup Oral Phase Functional Implications: Left anterior spillage;Right anterior spillage Pharyngeal  Phase Impairments: Suspected delayed Swallow;Cough - Immediate    Nectar Thick Nectar Thick Liquid: Within functional limits Presentation: Straw;Cup   Honey Thick Honey Thick Liquid: Not tested   Puree Puree: Within functional limits Presentation: Spoon   Solid     Solid: Impaired Oral Phase Functional Implications: Other (comment) (extended oral prep of graham cacker)     Blondell Laperle B. Quentin Ore, [facility], [place] Speech Language Pathologist Office:  [phone]  Shonna Chock 09/26/2021,11:43 AM

## 2021-09-26 NOTE — Evaluation (Signed)
Physical Therapy Evaluation Patient Details Name: Kari [name] MRN: [id number] DOB: 1934/05/28 Today's Date: 09/26/2021  History of Present Illness  88 yo female presents to [facility] on 1/23 with vomitying, weakness, worsening AMS. MRI reveals acute ischemic multifocal, multiple vascular territories embolic CVAs; more specifically R medial superior cerebellum and L parietal cortex. PMH includes dementia, HLD, HTN, CVA, SDH, osteoporosis.  Clinical Impression   Pt presents with generalized weakness, impaired cognition which is pt baseline especially after 4 pm per pt's family, impaired balance with history of falls, and decreased activity tolerance. Pt to benefit from acute PT to address deficits. Pt requiring mod +1-2 for pivotal stepping into and out of chair, requiring increased assist with fatigue. Pt's family eager for pt to d/c home as opposed to SNF, state they can "get 3 people to help her if we need to" for transfers. PT explained pt may be transfer-level only at this time for safety and given physical assist she is requiring, family agreeable to d/c home with w/c and HHPT. Recommending aide services if possible. PT to progress mobility as tolerated, and will continue to follow acutely.         Recommendations for follow up therapy are one component of a multi-disciplinary discharge planning process, led by the attending physician.  Recommendations may be updated based on patient status, additional functional criteria and insurance authorization.  Follow Up Recommendations Home health PT (maximize [facility] services, aide if pt qualifies. Pt and family interested in [facility] services in [place])    Assistance Recommended at Discharge Frequent or constant Supervision/Assistance  Patient can return home with the following  A lot of help with walking and/or transfers;A lot of help with bathing/dressing/bathroom;Assistance with cooking/housework;Assistance with feeding;Direct supervision/assist for  medications management;Direct supervision/assist for financial management;Assist for transportation;Help with stairs or ramp for entrance    Equipment Recommendations Wheelchair (measurements PT);Wheelchair cushion (measurements PT);BSC/3in1  Recommendations for Other Services       Functional Status Assessment Patient has had a recent decline in their functional status and demonstrates the ability to make significant improvements in function in a reasonable and predictable amount of time.     Precautions / Restrictions Precautions Precautions: Fall Restrictions Weight Bearing Restrictions: No      Mobility  Bed Mobility Overal bed mobility: Needs Assistance Bed Mobility: Supine to Sit, Sit to Supine     Supine to sit: Mod assist Sit to supine: Mod assist   General bed mobility comments: mod assist for supine<>sit for trunk and LE management, boost up in bed upon return to supine.    Transfers Overall transfer level: Needs assistance Equipment used: Rolling walker (2 wheels), 2 person hand held assist Transfers: Sit to/from Stand, Bed to chair/wheelchair/BSC Sit to Stand: Mod assist   Step pivot transfers: Mod assist, +2 physical assistance Squat pivot transfers: Mod assist     General transfer comment: mod assist for power up, rise, steadying, hand placement in RW, stepping to chair towards pt's R. Increased physical assist for transfer back to bed due to pt fatigue, pt's caregivers daughter and son assisting with transfer to ensure they can assist pt at d/c.    Ambulation/Gait               General Gait Details: x3 pivotal steps only  Stairs            Wheelchair Mobility    Modified Rankin (Stroke Patients Only) Modified Rankin (Stroke Patients Only) Pre-Morbid Rankin Score: Moderately severe disability Modified  Rankin: Moderately severe disability     Balance Overall balance assessment: Needs assistance, History of Falls (x1 fall in  November) Sitting-balance support: Bilateral upper extremity supported, Feet supported Sitting balance-Leahy Scale: Poor Sitting balance - Comments: posterior leaning intermittently Postural control: Posterior lean Standing balance support: Bilateral upper extremity supported, During functional activity, Reliant on assistive device for balance Standing balance-Leahy Scale: Poor                               Pertinent Vitals/Pain Pain Assessment Pain Assessment: Faces Faces Pain Scale: No hurt Pain Intervention(s): Monitored during session    Home Living Family/patient expects to be discharged to:: Private residence Living Arrangements: Children Available Help at Discharge: Family;Available 24 hours/day Type of Home: House Home Access: Stairs to enter   [employer] of Steps: 1   Home Layout: One level Home Equipment: Conservation officer, nature (2 wheels);Cane - single point      Prior Function Prior Level of Function : Needs assist  Cognitive Assist : ADLs (cognitive);Mobility (cognitive) Mobility (Cognitive): Step by step cues ADLs (Cognitive): Step by step cues Physical Assist : Mobility (physical);ADLs (physical) Mobility (physical): Bed mobility;Transfers;Gait;Stairs ADLs (physical): Grooming;Bathing;Dressing;Toileting;IADLs Mobility Comments: pt's family states pt requires support when getting into/out of bed, usually likes to be as independent as possible with transfers and gait with cane or RW ADLs Comments: pt's daughter assists with dressing, bathing, toileting; pt wears briefs at night and sleeps through the night per son and daughter report. Pt's husband used to assist pt with all mobility and ADLs, passed x2 months ago so children have taken over 24/7 assist.     Hand Dominance   Dominant Hand: Right    Extremity/Trunk Assessment   Upper Extremity Assessment Upper Extremity Assessment: Defer to OT evaluation    Lower Extremity Assessment Lower  Extremity Assessment: Generalized weakness    Cervical / Trunk Assessment Cervical / Trunk Assessment: Kyphotic  Communication   Communication: Expressive difficulties  Cognition Arousal/Alertness: Awake/alert Behavior During Therapy: Restless Overall Cognitive Status: History of cognitive impairments - at baseline                                 General Comments: history of dementia, follows one-step commands 75% of the time, disoriented x4 but per family this is baseline        General Comments General comments (skin integrity, edema, etc.): soiled in urine upon PT arrival to room, PT assisted pt in washup and linen change    Exercises     Assessment/Plan    PT Assessment Patient needs continued PT services  PT Problem List Decreased strength;Decreased mobility;Decreased safety awareness;Decreased activity tolerance;Decreased balance;Decreased cognition;Decreased knowledge of use of DME;Pain       PT Treatment Interventions DME instruction;Therapeutic activities;Gait training;Therapeutic exercise;Patient/family education;Balance training;Functional mobility training;Neuromuscular re-education    PT Goals (Current goals can be found in the Care Plan section)  Acute Rehab PT Goals Patient Stated Goal: home PT Goal Formulation: With family Time For Goal Achievement: 10/10/21 Potential to Achieve Goals: Good    Frequency Min 3X/week     Co-evaluation               AM-PAC PT "6 Clicks" Mobility  Outcome Measure Help needed turning from your back to your side while in a flat bed without using bedrails?: A Lot Help needed moving from lying on  your back to sitting on the side of a flat bed without using bedrails?: A Lot Help needed moving to and from a bed to a chair (including a wheelchair)?: A Lot Help needed standing up from a chair using your arms (e.g., wheelchair or bedside chair)?: A Lot Help needed to walk in hospital room?: Total Help needed  climbing 3-5 steps with a railing? : Total 6 Click Score: 10    End of Session   Activity Tolerance: Patient limited by fatigue Patient left: in bed;with call bell/phone within reach;with bed alarm set;with family/visitor present Nurse Communication: Mobility status PT Visit Diagnosis: Other abnormalities of gait and mobility (R26.89);Muscle weakness (generalized) (M62.81);Difficulty in walking, not elsewhere classified (R26.2)    Time: 9597-4718 PT Time Calculation (min) (ACUTE ONLY): 29 min   Charges:   PT Evaluation $PT Eval Low Complexity: 1 Low PT Treatments $Therapeutic Activity: 8-22 mins       Stacie Glaze, PT DPT Acute Rehabilitation Services Pager [phone]  Office [phone]   Roxine Caddy E Ruffin Pyo 09/26/2021, 5:03 PM

## 2021-09-26 NOTE — Progress Notes (Addendum)
STROKE TEAM PROGRESS NOTE   INTERVAL HISTORY Her daughter and pastor are at the bedside.  Patient has baseline dementia. She was disoriented x4 on assessment.   Vitals:   09/26/21 0100 09/26/21 0308 09/26/21 0605 09/26/21 0749  BP: 103/84 101/67 (!) 126/94 (!) 132/107  Pulse: 89 71 84 71  Resp: (!) 21 16 16 20   Temp:    98.2 F (36.8 C)  TempSrc:    Oral  SpO2: 93% 95% 95% 99%  Weight:       CBC:  Recent Labs  Lab 09/25/21 1802 09/25/21 1803  WBC 10.9*  --   NEUTROABS 9.3*  --   HGB 14.4 15.3*  HCT 45.3 45.0  MCV 94.2  --   PLT 231  --    Basic Metabolic Panel:  Recent Labs  Lab 09/25/21 1802 09/25/21 1803  NA 135 137  K 4.3 4.0  CL 105 102  CO2 22  --   GLUCOSE 146* 147*  BUN 7* 6*  CREATININE 0.51 0.30*  CALCIUM 8.5*  --    Lipid Panel:  Recent Labs  Lab 09/26/21 0312  CHOL 113  TRIG 75  HDL 37*  CHOLHDL 3.1  VLDL 15  LDLCALC 61   HgbA1c: No results for input(s): HGBA1C in the last 168 hours. Urine Drug Screen: No results for input(s): LABOPIA, COCAINSCRNUR, LABBENZ, AMPHETMU, THCU, LABBARB in the last 168 hours.  Alcohol Level No results for input(s): ETH in the last 168 hours.  IMAGING past 24 hours CT ABDOMEN PELVIS WO CONTRAST  Result Date: 09/25/2021 CLINICAL DATA:  abdominal distension.  Vomiting. EXAM: CT ABDOMEN AND PELVIS WITHOUT CONTRAST TECHNIQUE: Multidetector CT imaging of the abdomen and pelvis was performed following the standard protocol without IV contrast. RADIATION DOSE REDUCTION: This exam was performed according to the departmental dose-optimization program which includes automated exposure control, adjustment of the mA and/or kV according to patient size and/or use of iterative reconstruction technique. COMPARISON:  07/30/2021 FINDINGS: Lower chest: Motion degraded evaluation of the lung bases. Bibasilar scarring cardiomegaly, with multivessel coronary artery calcification. Small hiatal hernia. Hepatobiliary: Hepatic cysts.  Gallstones up to 1.0 cm without acute cholecystitis or biliary duct dilatation. Pancreas: Normal pancreas for age. Spleen: Normal in size, without focal abnormality. Adrenals/Urinary Tract: Mild left adrenal thickening. Normal right adrenal gland. Mildly scarred left kidney. No hydronephrosis. Contrast within renal collecting systems and bladder from today's CTA. Pelvic floor laxity with small cystocele. Stomach/Bowel: Normal remainder of the stomach. Extensive colonic diverticulosis. Motion degradation in the upper abdomen. Normal terminal ileum and appendix. Normal small bowel. Vascular/Lymphatic: Aortic atherosclerosis. No abdominopelvic adenopathy. Reproductive: Hysterectomy.  No adnexal mass. Other: No significant free fluid.  No free intraperitoneal air. Musculoskeletal: Osteopenia. Trace L4-5 anterolisthesis. Lumbosacral spondylosis. S shaped thoracolumbar spine curvature. IMPRESSION: 1.  No acute process in the abdomen or pelvis. 2. Cholelithiasis. 3. Pelvic floor laxity with cystocele. 4. Coronary artery atherosclerosis. Aortic Atherosclerosis (ICD10-I70.0). 5.  Tiny hiatal hernia. Electronically Signed   By: Abigail Miyamoto M.D.   On: 09/25/2021 18:04   MR BRAIN WO CONTRAST  Result Date: 09/25/2021 CLINICAL DATA:  Stroke suspected EXAM: MRI HEAD WITHOUT CONTRAST TECHNIQUE: Multiplanar, multiecho pulse sequences of the brain and surrounding structures were obtained without intravenous contrast. COMPARISON:  No prior MRI, correlation is made with same day CT and CTA FINDINGS: Evaluation is limited due to motion artifact and patient inability to cooperate. Brain: Moderate area of restricted diffusion with ADC correlate in the right medial superior cerebellar hemisphere,  which measures up to 2.6 x 2.4 x 2.5 cm (AP x TR x CC) (series 3, image 18 and series 4, images 13 and 11). Additional area of restricted diffusion with ADC correlate in the left parietal cortex (series 3, image 34). No definite acute  hemorrhage, mass, mass effect, or midline shift. More remote bilateral cerebellar infarcts. Dilated perivascular spaces in the bilateral basal ganglia. Confluent T2 hyperintense signal in the periventricular white matter, likely the sequela of severe chronic small vessel ischemic disease. No hydrocephalus or extra-axial collection. Vascular: Normal flow voids, within the aforementioned limitations. Skull and upper cervical spine: Normal marrow signal. Sinuses/Orbits: Left maxillary mucous retention cyst. Status post bilateral lens replacements. Other: None. IMPRESSION: Evaluation is significantly limited by motion artifact and patient noncooperation. Within this limitation, there are acute infarcts in the right medial superior cerebellum and left parietal cortex. Given multiple vascular territories, consider an embolic etiology. These results were called by telephone at the time of interpretation on 09/25/2021 at 9:38 pm to provider [facility] , who verbally acknowledged these results. Electronically Signed   By: Merilyn Baba M.D.   On: 09/25/2021 21:38   DG Chest Portable 1 View  Result Date: 09/25/2021 CLINICAL DATA:  Rule out aspiration EXAM: PORTABLE CHEST 1 VIEW COMPARISON:  Chest x-ray dated July 20, 2021 FINDINGS: Cardiac and mediastinal contours are unchanged large and unchanged when compared with prior exam. Bilateral interstitial opacities. No large pleural effusion or pneumothorax. IMPRESSION: Bilateral interstitial opacities, favor pulmonary edema. Electronically Signed   By: Yetta Glassman M.D.   On: 09/25/2021 19:09   CT HEAD CODE STROKE WO CONTRAST  Result Date: 09/25/2021 CLINICAL DATA:  Code stroke.  Neuro deficit, acute, stroke suspected EXAM: CT HEAD WITHOUT CONTRAST TECHNIQUE: Contiguous axial images were obtained from the base of the skull through the vertex without intravenous contrast. RADIATION DOSE REDUCTION: This exam was performed according to the departmental  dose-optimization program which includes automated exposure control, adjustment of the mA and/or kV according to patient size and/or use of iterative reconstruction technique. COMPARISON:  07/30/2021 FINDINGS: Brain: There is no acute intracranial hemorrhage mass effect. There are new bilateral cerebellar infarcts. Several chronic infarcts bilateral cerebral hemispheres as seen previously. Superimposed confluent areas of low-density in the supratentorial white matter probably reflect marked chronic microvascular ischemic changes. Prominence of the ventricles and sulci reflects stable parenchymal volume loss. No extra-axial collection. Vascular: There is intracranial atherosclerotic calcification at the skull base. No hyperdense vessel. Skull: No acute abnormality. Sinuses/Orbits: Bilateral lens replacements. Left maxillary sinus polypoid mucosal thickening. Other: Mastoid air cells are clear. IMPRESSION: No acute intracranial hemorrhage. New age-indeterminate bilateral cerebellar infarcts. Multiple chronic infarcts and advanced chronic microvascular ischemic changes as seen previously. Initial results were communicated to Dr. Rory Percy at 3:50 pm on 09/25/2021 by text page via the [facility] messaging system. Electronically Signed   By: Macy Mis M.D.   On: 09/25/2021 15:53   CT ANGIO HEAD NECK W WO CM (CODE STROKE)  Result Date: 09/25/2021 CLINICAL DATA:  Neuro deficit, acute, stroke suspected EXAM: CT ANGIOGRAPHY HEAD AND NECK TECHNIQUE: Multidetector CT imaging of the head and neck was performed using the standard protocol during bolus administration of intravenous contrast. Multiplanar CT image reconstructions and MIPs were obtained to evaluate the vascular anatomy. Carotid stenosis measurements (when applicable) are obtained utilizing NASCET criteria, using the distal internal carotid diameter as the denominator. RADIATION DOSE REDUCTION: This exam was performed according to the departmental dose-optimization  program which includes automated exposure  control, adjustment of the mA and/or kV according to patient size and/or use of iterative reconstruction technique. CONTRAST:  29mL OMNIPAQUE IOHEXOL 350 MG/ML SOLN COMPARISON:  None. FINDINGS: CTA NECK Aortic arch: Mixed but primarily calcified plaque along the arch. There is high-grade stenosis and no visible contrast opacification of proximal left subclavian. Reconstitution is present prior to vertebral origin. Right carotid system: Patent. Calcified plaque along the common carotid with minimal stenosis. Calcified plaque along the proximal internal carotid with less than 50% stenosis. Left carotid system: Patent. Mixed plaque along the common carotid with less than 50% stenosis. Calcified plaque along the proximal internal carotid with minimal stenosis. Vertebral arteries: Plaque at the left vertebral origin with possible marked stenosis. 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 There is plaque at the lower C2 level causing marked stenosis. The vessel again returns to a more normal caliber at the C1 level. Skeleton: Degenerative changes of the cervical spine. Other neck: Fatty atrophy of the right submandibular gland. Upper chest: No apical lung mass. Review of the MIP images confirms the above findings CTA HEAD Anterior circulation: Intracranial internal carotid arteries are patent with calcified plaque. There is mild stenosis primarily in the supraclinoid portions. Anterior and middle cerebral arteries are patent. There is mild stenosis of the left M1 MCA. Mild atherosclerotic irregularity distal MCA branches bilaterally. Posterior circulation: Intracranial vertebral arteries are patent with calcified plaque causing mild stenosis. A right PICA is present and patent. Left PICA is not identified. Probable small left AICA. Basilar is patent with mild calcified plaque. Posterior cerebral arteries are patent  with mild atherosclerotic irregularity. Small left posterior communicating artery. Venous sinuses: Patent as allowed by contrast bolus timing. 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 Subsequent plaque causes marked stenosis with the vessel returning to a more normal caliber at the C1 level. Intracranial vertebral arteries and basilar artery are patent without significant stenosis. Right PICA is patent. Probable small left diseased AICA. High-grade stenosis and visually occluded proximal left subclavian artery with reconstitution prior to vertebral origin. Plaque at the ICA origins causes less than 50% stenosis. Preliminary results discussed with Dr. Rory Percy on 09/25/2021 at 4 p.m. Electronically Signed   By: Macy Mis M.D.   On: 09/25/2021 16:13    PHYSICAL EXAM  Physical Exam  Constitutional: Appears well-developed and well-nourished.  Psych: Affect appropriate to situation Cardiovascular: Normal rate and regular rhythm.  Respiratory: Effort normal, non-labored breathing  Neuro: Mental Status: Patient is awake, alert, history of dementia; disoriented times 4 Unable to name herself, her daughter, stated she was at home and the year was 1977 Cranial Nerves: II: Visual Fields are full. Pupils are equal, round, and reactive to light.   III,IV, VI: EOMI without ptosis or diploplia. Saccadic eye movements V: Facial sensation is symmetric to temperature VII: Facial movement is symmetric resting and smiling VIII: Hearing is intact to voice X: Palate elevates symmetrically XI: Shoulder shrug is symmetric. XII: Tongue protrudes midline without atrophy or fasciculations.  Motor: Tone is normal. Bulk is normal. Moves all extremities antigravity, right hand grasp slight weaker Sensory: Sensation is symmetric to light touch and temperature in the arms and legs. No extinction to DSS present.  Coordination: FNF  intact with some slowing, no ataxia   ASSESSMENT/PLAN Ms. SUANN KLIER is a 88 y.o. female with history of dementia, atrial fibrillation on Eliquis, hyperlipidemia,  essential hypertension, obesity with a BMI of 29.92 kg/m, stroke in 2018, subdural hematoma, and vitamin D deficiency  presenting with   whole body weakness, AMS, nausea and vomiting. This happened approximately 12 to 1:00 PM.  Daughter states that she and the patient were out shopping.  Patient was unable to get back into the minivan due to weakness in her leg. Daughter states the patient's voice got very very quiet.  She thought the patient may have had a stroke and brought the patient to the ER. She previously had a stroke while on coumadin. She is compliant with her eliquis, her son and daughter manage her medications daily. MRI shows are acute infarcts in the right medial superior cerebellum and left parietal cortex. Suspect an embolic etiology given multiple vascular territories.    Stroke:  right medial superior cerebellum and left parietal cortex infarct  likely secondary due to embolism source Code Stroke -New age-indeterminate bilateral cerebellar infarcts.Multiple chronic infarcts and advanced chronic microvascular ischemic changes CTA head & neck Chronic right vertebral occlusion with reconstitution MRI  acute infarcts in the right medial superior cerebellum and left parietal cortex 2D Echo EF 55%, No regional wall motion abnormalities.  LDL 61 HgbA1c Pending VTE prophylaxis - SCDs Eliquis (apixaban) daily prior to admission, now on aspirin 325 mg daily. Start Pradaxa in 3-5 days  Therapy recommendations:  Pending Disposition:  Pending  Hypertension Home meds:  Captopril, atenolol Stable Permissive hypertension (OK if < 220/120) but gradually normalize in 5-7 days Long-term BP goal normotensive  Hyperlipidemia Home meds:  Pravastatin 80mg , Zetia 10mg , resumed in hospital LDL 61, goal < 70 Continue statin at  discharge  Diabetes type II Controlled Home meds:  None HgbA1c In process., goal < 7.0 CBGs SSI  Other Stroke Risk Factors Advanced Age >/= 15  Hx stroke/TIA Subdural hematoma  Other Active Problems Dementia Rivastigmine 4.6/24hr patch  Hospital day # 1  Patient seen and examined by NP/APP with MD. MD to update note as needed.   Janine Ores, DNP, FNP-BC [facility] Pager: [phone]  ATTENDING NOTE: I reviewed above note and agree with the assessment and plan. Pt was seen and examined.   88 year old female with history of dementia, A. fib on Eliquis, hypertension, hyperlipidemia, obesity, stroke in 2018, history of SDH admitted for nausea vomiting, abdominal pain and drowsiness.  Also family complaint of mild dysarthria more than baseline.  CT head showed old bilateral cerebellum infarct and right frontal infarct.  MRI showed acute right cerebellum infarct and left parietal infarct.  CT head and neck right VA occlusion with reconstitution and atherosclerosis.  Left subclavian artery occluded.  EF 55%.  LDL 61, A1c pending.  Creatinine 0.30.  WBC 10.9.  On exam, daughter and pastor at bedside.  Patient lying in bed, orientated to people only, but not to age, place or time or situation.  Follows some simple commands, very limited language output.  Able to have bilateral gaze with several attempts, visual field testing showed possible right lower quadrant quadrantanopsia.  Facial symmetrical, bilateral upper and lower extremity equal strengths. Sensation, coordination not corporative and gait not tested.  Etiology for patient stroke likely cardioembolic due to A. fib even on Eliquis.  Daughter stated that patient compliant with Eliquis result of recent doses.  Therefore, consider Eliquis failure.  Would recommend continue aspirin 325 for another 3 days and then switch to Pradaxa for further stroke prevention.  Daughter at bedside and son on the phone are in agreement.  Continue Zetia 10 and pravastatin 80.  Passed swallow on dysphagia 2 and nectar thick liquid.  PT/OT pending.  Patient follows with Dr. [name] at [facility] as outpatient.  For detailed assessment and plan, please refer to above as I have made changes wherever appropriate.   Neurology will sign off. Please call with questions. Pt will follow up with stroke clinic Dr. [name] at [facility] in about 4 weeks. Thanks for the consult.   Rosalin Hawking, MD PhD Stroke Neurology 09/26/2021 1:58 PM    To cont to cardiologyact Stroke Continuity provider, please refer to [email or web address] After hours, contact General Neurology

## 2021-09-26 NOTE — Progress Notes (Signed)
SLP Cancellation Note  Patient Details Name: Kari [name] MRN: [id number] DOB: Dec 21, 1933   Cancelled treatment:       Reason Eval/Treat Not Completed: Patient at procedure or test/unavailable  Unable to complete cognitive linguistic evaluation at this time. Pt having ultrasound. Will continue efforts. Of note, pt's daughter reports history of dementia.   Kari [name] B. Quentin Ore, [facility], [place] Speech Language Pathologist Office: [phone]  Shonna Chock 09/26/2021, 11:54 AM

## 2021-09-26 NOTE — Progress Notes (Signed)
Pt admitted to 3W15 at this time.  Pt alert and only oriented to self.  Has baseline dementia per family who is at bedside.  Placed on Tele, CCMD called and second verification complete.  Call bell within reach, and bed alarm set.  Pt appears restless but this is normal for patient per son.

## 2021-09-26 NOTE — ED Notes (Signed)
Daily meds held until speech consult who is to see pt this morning.

## 2021-09-26 NOTE — Hospital Course (Signed)
88 year old Caucasian female with a history of chronic A. fib on anticoagulation with Eliquis, hypertension, history of strokes, hyperlipidemia, hypertension presented to the ER with an acute change in mental status and weakness.  This happened approximately 12 to 1:00 PM.  Daughter states that she and the patient were out shopping.  Patient was unable to get back into the minivan due to weakness in her leg. Daughter states the patient's voice got very very quiet.  She thought the patient may have had a stroke and brought the patient to the ER.  Patient was seen by neurology.  Her anticoagulation was held.  MRI confirmed acute embolic infarcts.  Hospitalize for further management.

## 2021-09-26 NOTE — Progress Notes (Signed)
Echocardiogram 2D Echocardiogram has been performed.  Arlyss Gandy 09/26/2021, 12:05 PM

## 2021-09-27 LAB — CBC
HCT: 41.2 % (ref 36.0–46.0)
Hemoglobin: 13.5 g/dL (ref 12.0–15.0)
MCH: 30.2 pg (ref 26.0–34.0)
MCHC: 32.8 g/dL (ref 30.0–36.0)
MCV: 92.2 fL (ref 80.0–100.0)
Platelets: 199 10*3/uL (ref 150–400)
RBC: 4.47 MIL/uL (ref 3.87–5.11)
RDW: 14.6 % (ref 11.5–15.5)
WBC: 10 10*3/uL (ref 4.0–10.5)
nRBC: 0 % (ref 0.0–0.2)

## 2021-09-27 LAB — BASIC METABOLIC PANEL WITH GFR
Anion gap: 6 (ref 5–15)
BUN: <5 mg/dL — ABNORMAL LOW (ref 8–23)
CO2: 27 mmol/L (ref 22–32)
Calcium: 8.6 mg/dL — ABNORMAL LOW (ref 8.9–10.3)
Chloride: 100 mmol/L (ref 98–111)
Creatinine, Ser: 0.61 mg/dL (ref 0.44–1.00)
GFR, Estimated: >60 mL/min
Glucose, Bld: 114 mg/dL — ABNORMAL HIGH (ref 70–99)
Potassium: 3.2 mmol/L — ABNORMAL LOW (ref 3.5–5.1)
Sodium: 133 mmol/L — ABNORMAL LOW (ref 135–145)

## 2021-09-27 MED ORDER — ASPIRIN 325 MG PO TABS: 325.0000 mg | ORAL_TABLET | Freq: Every day | ORAL | 0 refills | Status: AC

## 2021-09-27 MED ORDER — DABIGATRAN ETEXILATE MESYLATE 150 MG PO CAPS: 150.0000 mg | ORAL_CAPSULE | Freq: Two times a day (BID) | ORAL | 2 refills | Status: DC

## 2021-09-27 MED ORDER — DABIGATRAN ETEXILATE MESYLATE 150 MG PO CAPS: 150.0000 mg | ORAL_CAPSULE | Freq: Two times a day (BID) | ORAL | Status: DC

## 2021-09-27 NOTE — Progress Notes (Signed)
[place] for Dabigatran Indication: atrial fibrillation  Allergies  Allergen Reactions   Erythromycin Other (See Comments)    Reaction unknown   Penicillins Other (See Comments)    Reaction unknown  Has patient had a PCN reaction causing immediate rash, facial/tongue/throat swelling, SOB or lightheadedness with hypotension: unknown Has patient had a PCN reaction causing severe rash involving mucus membranes or skin necrosis: unknown Has patient had a PCN reaction that required hospitalization : unknown Has patient had a PCN reaction occurring within the last 10 years: no If all of the above answers are "NO", then may proceed with Cephalosporin use.     Patient Measurements: Height: 5\' 2"  (157.5 cm) Weight: 74.2 kg (163 lb 9.3 oz) IBW/kg (Calculated) : 50.1  Vital Signs: Temp: 97.6 F (36.4 C) (01/25 1127) Temp Source: Oral (01/25 1127) BP: 163/69 (01/25 1127) Pulse Rate: 81 (01/25 1127)  Labs: Recent Labs    09/25/21 1802 09/25/21 1803 09/27/21 0417  HGB 14.4 15.3* 13.5  HCT 45.3 45.0 41.2  PLT 231  --  199  APTT 38*  --   --   LABPROT 16.7*  --   --   INR 1.4*  --   --   CREATININE 0.51 0.30* 0.61    Estimated Creatinine Clearance: 48.5 mL/min (by C-G formula based on SCr of 0.61 mg/dL).    Assessment: 88 y.o. female with medical history significant for prior stroke in 2018 while on warfarin and atrial fibrillation on apixaban PTA who presented with weakness and AMS found to have acute infarcts in the right medial superior cerebellum and left parietal cortex. Pharmacy consulted to manage initiation of dabigatran.   Renal function currently at baseline with average Scr between 0.6-0.7. CBC stable, no s/sx bleeding reported.  Goal of Therapy:  Monitor platelets by anticoagulation protocol: Yes   Plan:  Stop apixaban Start dabigaran 150mg  PO BID, beginning 09/29/21 Continue to monitor H&H and platelets    Thank you  for allowing pharmacy to be a part of this patients care.  Ardyth Harps, PharmD Clinical Pharmacist

## 2021-09-27 NOTE — Discharge Summary (Signed)
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 had CVA while taking coumadin and therefore changed to Eliquis   History of Present Illness / Brief narrative:  Patient is an 88 year old Caucasian female with a history of chronic A. fib on anticoagulation with Eliquis, hypertension, history of strokes, hyperlipidemia,  Patient was brought to the ED on 09/25/2021 with acute change in mental status and weakness.  Daughter stated that she and the patient were out shopping.  Patient was unable to get back into the minivan due to weakness in her leg. Daughter states the patient's voice got very very quiet.  She suspected a stroke and hence brought the patient to ED.    In the ED, MRI confirmed acute embolic infarcts. Admitted to hospitalist service Seen by neurology  Subjective:  Seen and examined this morning.  Pleasant elderly Caucasian female.  Lying on bed.  Not in distress.  Sleeping, wakes up on calling her name.  Son at bedside.  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   -MRI showed multiple infarcts.   -Admitted for stroke work-up. -LDL 61.  Continue with statin. -A1c 6. -Echo with EF 55%, mild concentric LVH, RVSP 33, no evidence of atrial level shunt. -Neurology consult appreciated. -Per neurology note, patient's stroke is likely cardioembolic in nature due to A. fib despite compliance  to Eliquis.  This was considered Eliquis failure.  Neurology recommended aspirin 325 mg for 3 days followed by Pradaxa 150 mg twice daily. -Continue Zetia 10 mg daily and pravastatin 80 mg daily. -Patient to follow-up in stroke clinic with Dr. Leta [name] in about 4 weeks.  Dysphagia -Secondary to stroke.  Currently on dysphagia 2 diet and nectar thick liquid  Chronic A. Fib -Anticoagulation plan as above. -Rate is reasonably well controlled on atenolol  Essential hypertension -Continue atenolol, captopril  Hyperlipidemia -Continue Zetia and pravastatin  Impaired mobility -PT eval obtained.  Home with PT recommended.     Discharge Medications:   Allergies as of 09/27/2021       Reactions   Erythromycin Other (See Comments)   Reaction unknown   Penicillins Other (See Comments)   Reaction unknown  Has patient had a PCN reaction causing immediate rash, facial/tongue/throat swelling, SOB or lightheadedness with hypotension: unknown Has patient had a PCN reaction causing severe rash involving mucus membranes or skin necrosis: unknown Has patient had a PCN reaction that required hospitalization : unknown Has patient had a PCN reaction occurring within the last 10 years: no If all of the above answers are "NO", then may proceed with Cephalosporin use.        Medication List     STOP taking these medications    Eliquis 5 MG Tabs tablet Generic drug: apixaban       TAKE these medications    aspirin 325 MG tablet Take 1 tablet (325 mg total) by mouth daily for 1 day. Start taking on: September 28, 2021   atenolol 50  MG tablet Commonly known as: TENORMIN TAKE 1 TABLET DAILY   Benfotiamine 150 MG Caps Take 150 mg by mouth daily.   captopril 50 MG tablet Commonly known as: CAPOTEN TAKE 1 TABLET BY MOUTH 2 TIMES DAILY FOR HIGH BLOOD PRESSURE. What changed: See the new instructions.   dabigatran 150 MG Caps capsule Commonly known as: PRADAXA Take 1 capsule (150 mg  total) by mouth every 12 (twelve) hours. Start taking on: September 29, 2021   ezetimibe 10 MG tablet Commonly known as: ZETIA TAKE 1 TABLET ONCE DAILY FOR CHOLESTEROL What changed: See the new instructions.   fluconazole 150 MG tablet Commonly known as: Diflucan Take one tablet by mouth now. Repeat dose in 7 days.   loratadine 10 MG tablet Commonly known as: CLARITIN Take 1 tablet (10 mg total) by mouth daily.   potassium chloride 10 MEQ tablet Commonly known as: KLOR-CON TAKE 1 TABLET DAILY   pravastatin 80 MG tablet Commonly known as: PRAVACHOL TAKE 1 TABLET ONCE DAILY FOR CHOLESTEROL What changed:  how much to take how to take this when to take this additional instructions   rivastigmine 4.6 mg/24hr Commonly known as: Exelon Place 1 patch (4.6 mg total) onto the skin daily.   vitamin C 500 MG tablet Commonly known as: ASCORBIC ACID Take 500 mg by mouth daily.   ZINC PO Take 1 tablet by mouth daily.               Durable Medical Equipment  (From admission, onward)           Start     Ordered   09/27/21 1137  For home use only DME Hospital bed  Once       Question Answer Comment  Length of Need 6 Months   The above medical condition requires: Patient requires the ability to reposition frequently   Head must be elevated greater than: 30 degrees   Bed type Semi-electric   Support Surface: Gel Overlay      09/27/21 1137   09/27/21 1031  For home use only DME lightweight manual wheelchair with seat cushion  Once       Comments: Patient suffers from stroke which impairs their ability to perform daily activities like bathing, dressing, grooming, and toileting in the home.  A walker will not resolve  issue with performing activities of daily living. A wheelchair will allow patient to safely perform daily activities. Patient is not able to propel themselves in the home using a standard weight wheelchair due to general weakness. Patient can self propel in the  lightweight wheelchair. Length of need 6 months . Accessories: elevating leg rests (ELRs), wheel locks, extensions and anti-tippers.   09/27/21 1035            Wound care:     Discharge Instructions:   Discharge Instructions     Ambulatory referral to Neurology   Complete by: As directed    Follow up with Dr. Leta [name] at [facility] in 4-6 weeks.  Patient is Dr. Gladstone Lighter patient. Thanks.   Call MD for:  difficulty breathing, headache or visual disturbances   Complete by: As directed    Call MD for:  extreme fatigue   Complete by: As directed    Call MD for:  hives   Complete by: As directed    Call MD for:  persistant dizziness or light-headedness   Complete by: As directed    Call MD for:  persistant nausea and vomiting   Complete  by: As directed    Call MD for:  severe uncontrolled pain   Complete by: As directed    Call MD for:  temperature >100.4   Complete by: As directed    Diet general   Complete by: As directed    Dysphagia 2 diet   Discharge instructions   Complete by: As directed    General discharge instructions:  Follow with Primary MD Kari Norlander, DO in 7 days   Get CBC/BMP checked in next visit within 1 week by PCP or SNF MD. (We routinely change or add medications that can affect your baseline labs and fluid status, therefore we recommend that you get the mentioned basic workup next visit with your PCP, your PCP may decide not to get them or add new tests based on their clinical decision)  On your next visit with your PCP, please get your medicines reviewed and adjusted.  Please request your PCP  to go over all hospital tests, procedures, radiology results at the follow up, please get all Hospital records sent to your PCP by signing hospital release before you go home.  Activity: As tolerated with Full fall precautions use walker/cane & assistance as needed  Avoid using any recreational substances like cigarette, tobacco, alcohol, or  non-prescribed drug.  If you experience worsening of your admission symptoms, develop shortness of breath, life threatening emergency, suicidal or homicidal thoughts you must seek medical attention immediately by calling 911 or calling your MD immediately  if symptoms less severe.  You must read complete instructions/literature along with all the possible adverse reactions/side effects for all the medicines you take and that have been prescribed to you. Take any new medicine only after you have completely understood and accepted all the possible adverse reactions/side effects.   Do not drive, operate heavy machinery, perform activities at heights, swimming or participation in water activities or provide baby sitting services if your were admitted for syncope or siezures until you have seen by Primary MD or a Neurologist and advised to do so again.  Do not drive when taking Pain medications.  Do not take more than prescribed Pain, Sleep and Anxiety Medications  Wear Seat belts while driving.  Please note You were cared for by a hospitalist during your hospital stay. If you have any questions about your discharge medications or the care you received while you were in the hospital after you are discharged, you can call the unit and asked to speak with the hospitalist on call if the hospitalist that took care of you is not available. Once you are discharged, your primary care physician will handle any further medical issues. Please note that NO REFILLS for any discharge medications will be authorized once you are discharged, as it is imperative that you return to your primary care physician (or establish a relationship with a primary care physician if you do not have one) for your aftercare needs so that they can reassess your need for medications and monitor your lab values.   Increase activity slowly   Complete by: As directed        Follow ups:    Follow-up Information     Penumalli, Earlean Polka,  MD. Schedule an appointment as soon as possible for a visit in 1 month(s).   Specialties: Neurology, Radiology Contact information: [address] [place] [address] [place], [place] Follow up.   Specialty: Home Health Services Why: The  home health agency will contact you for the first home visit Contact information: [place] [address] [phone]         Kari Norlander, DO Follow up.   Specialty: Family Medicine Contact information: [place] [address] [phone]                 Discharge Exam:   Vitals:   09/27/21 0021 09/27/21 0350 09/27/21 0823 09/27/21 1127  BP: 106/70 114/76 117/86 (!) 163/69  Pulse: 94 100 99 81  Resp: 20 (!) 24 18 18   Temp: (!) 100.6 F (38.1 C) 98.8 F (37.1 C) 98.9 F (37.2 C) 97.6 F (36.4 C)  TempSrc: Axillary Axillary Oral Oral  SpO2: 93% 92% 91% 96%  Weight:      Height:        Body mass index is 29.92 kg/m.  General exam: Pleasant, elderly Caucasian female.  Not in distress Skin: No rashes, lesions or ulcers. HEENT: Atraumatic, normocephalic, no obvious bleeding Lungs: Clear to auscultation bilaterally CVS: Regular rate and rhythm, no murmur GI/Abd soft, nontender, nondistended, bowel sound present CNS: Opens eyes on verbal command, able to follow commands Psychiatry: Sad affect Extremities: No pedal edema, no calf tenderness  Time coordinating discharge: 35 minutes   The results of significant diagnostics from this hospitalization (including imaging, microbiology, ancillary and laboratory) are listed below for reference.    Procedures and Diagnostic Studies:   CT ABDOMEN PELVIS WO CONTRAST  Result Date: 09/25/2021 CLINICAL DATA:  abdominal distension.  Vomiting. EXAM: CT ABDOMEN AND PELVIS WITHOUT CONTRAST TECHNIQUE: Multidetector CT imaging of the abdomen and pelvis was performed following the standard protocol without  IV contrast. RADIATION DOSE REDUCTION: This exam was performed according to the departmental dose-optimization program which includes automated exposure control, adjustment of the mA and/or kV according to patient size and/or use of iterative reconstruction technique. COMPARISON:  07/30/2021 FINDINGS: Lower chest: Motion degraded evaluation of the lung bases. Bibasilar scarring cardiomegaly, with multivessel coronary artery calcification. Small hiatal hernia. Hepatobiliary: Hepatic cysts. Gallstones up to 1.0 cm without acute cholecystitis or biliary duct dilatation. Pancreas: Normal pancreas for age. Spleen: Normal in size, without focal abnormality. Adrenals/Urinary Tract: Mild left adrenal thickening. Normal right adrenal gland. Mildly scarred left kidney. No hydronephrosis. Contrast within renal collecting systems and bladder from today's CTA. Pelvic floor laxity with small cystocele. Stomach/Bowel: Normal remainder of the stomach. Extensive colonic diverticulosis. Motion degradation in the upper abdomen. Normal terminal ileum and appendix. Normal small bowel. Vascular/Lymphatic: Aortic atherosclerosis. No abdominopelvic adenopathy. Reproductive: Hysterectomy.  No adnexal mass. Other: No significant free fluid.  No free intraperitoneal air. Musculoskeletal: Osteopenia. Trace L4-5 anterolisthesis. Lumbosacral spondylosis. S shaped thoracolumbar spine curvature. IMPRESSION: 1.  No acute process in the abdomen or pelvis. 2. Cholelithiasis. 3. Pelvic floor laxity with cystocele. 4. Coronary artery atherosclerosis. Aortic Atherosclerosis (ICD10-I70.0). 5.  Tiny hiatal hernia. Electronically Signed   By: Abigail Miyamoto M.D.   On: 09/25/2021 18:04   MR BRAIN WO CONTRAST  Result Date: 09/25/2021 CLINICAL DATA:  Stroke suspected EXAM: MRI HEAD WITHOUT CONTRAST TECHNIQUE: Multiplanar, multiecho pulse sequences of the brain and surrounding structures were obtained without intravenous contrast. COMPARISON:  No prior MRI,  correlation is made with same day CT and CTA FINDINGS: Evaluation is limited due to motion artifact and patient inability to cooperate. Brain: Moderate area of restricted diffusion with ADC correlate in the right medial superior cerebellar hemisphere, which measures up to 2.6 x  2.4 x 2.5 cm (AP x TR x CC) (series 3, image 18 and series 4, images 13 and 11). Additional area of restricted diffusion with ADC correlate in the left parietal cortex (series 3, image 34). No definite acute hemorrhage, mass, mass effect, or midline shift. More remote bilateral cerebellar infarcts. Dilated perivascular spaces in the bilateral basal ganglia. Confluent T2 hyperintense signal in the periventricular white matter, likely the sequela of severe chronic small vessel ischemic disease. No hydrocephalus or extra-axial collection. Vascular: Normal flow voids, within the aforementioned limitations. Skull and upper cervical spine: Normal marrow signal. Sinuses/Orbits: Left maxillary mucous retention cyst. Status post bilateral lens replacements. Other: None. IMPRESSION: Evaluation is significantly limited by motion artifact and patient noncooperation. Within this limitation, there are acute infarcts in the right medial superior cerebellum and left parietal cortex. Given multiple vascular territories, consider an embolic etiology. These results were called by telephone at the time of interpretation on 09/25/2021 at 9:38 pm to provider [facility] , who verbally acknowledged these results. Electronically Signed   By: Merilyn Baba M.D.   On: 09/25/2021 21:38   DG Chest Portable 1 View  Result Date: 09/25/2021 CLINICAL DATA:  Rule out aspiration EXAM: PORTABLE CHEST 1 VIEW COMPARISON:  Chest x-ray dated July 20, 2021 FINDINGS: Cardiac and mediastinal contours are unchanged large and unchanged when compared with prior exam. Bilateral interstitial opacities. No large pleural effusion or pneumothorax. IMPRESSION: Bilateral interstitial  opacities, favor pulmonary edema. Electronically Signed   By: Yetta Glassman M.D.   On: 09/25/2021 19:09   ECHOCARDIOGRAM COMPLETE  Result Date: 09/26/2021    ECHOCARDIOGRAM REPORT   Patient Name:   Kari [name] Date of Exam: 09/26/2021 Medical Rec #:  [id number]      Height:       62.0 in Accession #:    [id number]     Weight:       163.6 lb Date of Birth:  01-19-1934      BSA:          1.755 m Patient Age:    81 years       BP:           126/94 mmHg Patient Gender: F              HR:           84 bpm. Exam Location:  Inpatient Procedure: 2D Echo Indications:    Stroke  History:        Patient has no prior history of Echocardiogram examinations.                 Stroke, Arrythmias:Atrial Fibrillation; Risk                 Factors:Dyslipidemia and Hypertension.  Sonographer:    Arlyss Gandy Referring Phys: [place]  1. Left ventricular ejection fraction, by estimation, is 55%. The left ventricle has low normal function. The left ventricle has no regional wall motion abnormalities. There is mild concentric left ventricular hypertrophy. Left ventricular diastolic parameters are indeterminate.  2. Right ventricular systolic function is normal. The right ventricular size is normal. There is normal pulmonary artery systolic pressure. The estimated right ventricular systolic pressure is 36.1 mmHg.  3. Left atrial size was severely dilated.  4. The mitral valve is grossly normal. Mild mitral valve regurgitation. No evidence of mitral stenosis. Moderate mitral annular calcification.  5. The aortic valve is tricuspid. Aortic valve regurgitation is not visualized. No aortic  stenosis is present. Comparison(s): No prior Echocardiogram. FINDINGS  Left Ventricle: Left ventricular ejection fraction, by estimation, is 55%. The left ventricle has low normal function. The left ventricle has no regional wall motion abnormalities. The left ventricular internal cavity size was small. There is mild concentric left  ventricular hypertrophy. Left ventricular diastolic parameters are indeterminate. Right Ventricle: The right ventricular size is normal. No increase in right ventricular wall thickness. Right ventricular systolic function is normal. There is normal pulmonary artery systolic pressure. The tricuspid regurgitant velocity is 2.50 m/s, and  with an assumed right atrial pressure of 8 mmHg, the estimated right ventricular systolic pressure is 22.0 mmHg. Left Atrium: Left atrial size was severely dilated. Right Atrium: Right atrial size was normal in size. Pericardium: There is no evidence of pericardial effusion. Mitral Valve: The mitral valve is grossly normal. Moderate mitral annular calcification. Mild mitral valve regurgitation. No evidence of mitral valve stenosis. Tricuspid Valve: The tricuspid valve is normal in structure. Tricuspid valve regurgitation is mild . No evidence of tricuspid stenosis. Aortic Valve: The aortic valve is tricuspid. Aortic valve regurgitation is not visualized. No aortic stenosis is present. Aortic valve mean gradient measures 2.0 mmHg. Aortic valve peak gradient measures 4.2 mmHg. Aortic valve area, by VTI measures 1.75 cm. Pulmonic Valve: The pulmonic valve was normal in structure. Pulmonic valve regurgitation is not visualized. No evidence of pulmonic stenosis. Aorta: The aortic root and ascending aorta are structurally normal, with no evidence of dilitation. IAS/Shunts: No atrial level shunt detected by color flow Doppler.  LEFT VENTRICLE PLAX 2D LVIDd:         3.70 cm   Diastology LVIDs:         3.00 cm   LV e' medial:    6.64 cm/s LV PW:         1.20 cm   LV E/e' medial:  15.7 LV IVS:        1.20 cm   LV e' lateral:   8.16 cm/s LVOT diam:     2.00 cm   LV E/e' lateral: 12.7 LV SV:         36 LV SV Index:   21 LVOT Area:     3.14 cm  RIGHT VENTRICLE            IVC RV Basal diam:  2.80 cm    IVC diam: 2.20 cm RV Mid diam:    2.50 cm RV S prime:     9.14 cm/s TAPSE (M-mode): 1.2 cm LEFT  ATRIUM              Index        RIGHT ATRIUM           Index LA diam:        5.00 cm  2.85 cm/m   RA Area:     18.90 cm LA Vol (A2C):   118.0 ml 67.23 ml/m  RA Volume:   48.30 ml  27.52 ml/m LA Vol (A4C):   122.0 ml 69.51 ml/m LA Biplane Vol: 123.0 ml 70.08 ml/m  AORTIC VALVE AV Area (Vmax):    1.90 cm AV Area (Vmean):   1.87 cm AV Area (VTI):     1.75 cm AV Vmax:           102.00 cm/s AV Vmean:          71.400 cm/s AV VTI:            0.208 m AV Peak Grad:  4.2 mmHg AV Mean Grad:      2.0 mmHg LVOT Vmax:         61.80 cm/s LVOT Vmean:        42.400 cm/s LVOT VTI:          0.116 m LVOT/AV VTI ratio: 0.56  AORTA Ao Root diam: 2.60 cm Ao Asc diam:  3.00 cm MITRAL VALVE                TRICUSPID VALVE MV Area (PHT): 4.10 cm     TR Peak grad:   25.0 mmHg MV Decel Time: 185 msec     TR Vmax:        250.00 cm/s MV E velocity: 104.00 cm/s                             SHUNTS                             Systemic VTI:  0.12 m                             Systemic Diam: 2.00 cm Rudean Haskell MD Electronically signed by Rudean Haskell MD Signature Date/Time: 09/26/2021/1:28:59 PM    Final    CT HEAD CODE STROKE WO CONTRAST  Result Date: 09/25/2021 CLINICAL DATA:  Code stroke.  Neuro deficit, acute, stroke suspected EXAM: CT HEAD WITHOUT CONTRAST TECHNIQUE: Contiguous axial images were obtained from the base of the skull through the vertex without intravenous contrast. RADIATION DOSE REDUCTION: This exam was performed according to the departmental dose-optimization program which includes automated exposure control, adjustment of the mA and/or kV according to patient size and/or use of iterative reconstruction technique. COMPARISON:  07/30/2021 FINDINGS: Brain: There is no acute intracranial hemorrhage mass effect. There are new bilateral cerebellar infarcts. Several chronic infarcts bilateral cerebral hemispheres as seen previously. Superimposed confluent areas of low-density in the supratentorial  white matter probably reflect marked chronic microvascular ischemic changes. Prominence of the ventricles and sulci reflects stable parenchymal volume loss. No extra-axial collection. Vascular: There is intracranial atherosclerotic calcification at the skull base. No hyperdense vessel. Skull: No acute abnormality. Sinuses/Orbits: Bilateral lens replacements. Left maxillary sinus polypoid mucosal thickening. Other: Mastoid air cells are clear. IMPRESSION: No acute intracranial hemorrhage. New age-indeterminate bilateral cerebellar infarcts. Multiple chronic infarcts and advanced chronic microvascular ischemic changes as seen previously. Initial results were communicated to Dr. Rory Percy at 3:50 pm on 09/25/2021 by text page via the [facility] messaging system. Electronically Signed   By: Macy Mis M.D.   On: 09/25/2021 15:53   CT ANGIO HEAD NECK W WO CM (CODE STROKE)  Result Date: 09/25/2021 CLINICAL DATA:  Neuro deficit, acute, stroke suspected EXAM: CT ANGIOGRAPHY HEAD AND NECK TECHNIQUE: Multidetector CT imaging of the head and neck was performed using the standard protocol during bolus administration of intravenous contrast. Multiplanar CT image reconstructions and MIPs were obtained to evaluate the vascular anatomy. Carotid stenosis measurements (when applicable) are obtained utilizing NASCET criteria, using the distal internal carotid diameter as the denominator. RADIATION DOSE REDUCTION: This exam was performed according to the departmental dose-optimization program which includes automated exposure control, adjustment of the mA and/or kV according to patient size and/or use of iterative reconstruction technique. CONTRAST:  46mL OMNIPAQUE IOHEXOL 350 MG/ML SOLN COMPARISON:  None. FINDINGS: CTA NECK Aortic arch: Mixed but  primarily calcified plaque along the arch. There is high-grade stenosis and no visible contrast opacification of proximal left subclavian. Reconstitution is present prior to vertebral origin.  Right carotid system: Patent. Calcified plaque along the common carotid with minimal stenosis. Calcified plaque along the proximal internal carotid with less than 50% stenosis. Left carotid system: Patent. Mixed plaque along the common carotid with less than 50% stenosis. Calcified plaque along the proximal internal carotid with minimal stenosis. Vertebral arteries: Plaque at the left vertebral origin with possible marked stenosis. 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 There is plaque at the lower C2 level causing marked stenosis. The vessel again returns to a more normal caliber at the C1 level. Skeleton: Degenerative changes of the cervical spine. Other neck: Fatty atrophy of the right submandibular gland. Upper chest: No apical lung mass. Review of the MIP images confirms the above findings CTA HEAD Anterior circulation: Intracranial internal carotid arteries are patent with calcified plaque. There is mild stenosis primarily in the supraclinoid portions. Anterior and middle cerebral arteries are patent. There is mild stenosis of the left M1 MCA. Mild atherosclerotic irregularity distal MCA branches bilaterally. Posterior circulation: Intracranial vertebral arteries are patent with calcified plaque causing mild stenosis. A right PICA is present and patent. Left PICA is not identified. Probable small left AICA. Basilar is patent with mild calcified plaque. Posterior cerebral arteries are patent with mild atherosclerotic irregularity. Small left posterior communicating artery. Venous sinuses: Patent as allowed by contrast bolus timing. 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 Subsequent plaque causes marked stenosis with the vessel returning to a more normal caliber at the C1 level. Intracranial vertebral arteries and basilar artery are patent without significant  stenosis. Right PICA is patent. Probable small left diseased AICA. High-grade stenosis and visually occluded proximal left subclavian artery with reconstitution prior to vertebral origin. Plaque at the ICA origins causes less than 50% stenosis. Preliminary results discussed with Dr. Rory Percy on 09/25/2021 at 4 p.m. Electronically Signed   By: Macy Mis M.D.   On: 09/25/2021 16:13     Labs:   Basic Metabolic Panel: Recent Labs  Lab 09/25/21 1802 09/25/21 1803 09/27/21 0417  NA 135 137 133*  K 4.3 4.0 3.2*  CL 105 102 100  CO2 22  --  27  GLUCOSE 146* 147* 114*  BUN 7* 6* <5*  CREATININE 0.51 0.30* 0.61  CALCIUM 8.5*  --  8.6*   GFR Estimated Creatinine Clearance: 48.5 mL/min (by C-G formula based on SCr of 0.61 mg/dL). Liver Function Tests: Recent Labs  Lab 09/25/21 1802  AST 27  ALT 10  ALKPHOS 68  BILITOT 1.3*  PROT 6.6  ALBUMIN 3.4*   Recent Labs  Lab 09/25/21 1802  LIPASE 25   No results for input(s): AMMONIA in the last 168 hours. Coagulation profile Recent Labs  Lab 09/25/21 1802  INR 1.4*    CBC: Recent Labs  Lab 09/25/21 1802 09/25/21 1803 09/27/21 0417  WBC 10.9*  --  10.0  NEUTROABS 9.3*  --   --   HGB 14.4 15.3* 13.5  HCT 45.3 45.0 41.2  MCV 94.2  --  92.2  PLT 231  --  199   Cardiac Enzymes: No results for input(s): CKTOTAL, CKMB, CKMBINDEX, TROPONINI in the last 168 hours. BNP: Invalid input(s): POCBNP CBG: No results for input(s): GLUCAP in the last 168 hours.  D-Dimer No results for input(s): DDIMER in the last 72 hours. Hgb A1c Recent Labs    09/26/21 0312  HGBA1C 6.0*   Lipid Profile Recent Labs    09/26/21 0312  CHOL 113  HDL 37*  LDLCALC 61  TRIG 75  CHOLHDL 3.1   Thyroid function studies No results for input(s): TSH, T4TOTAL, T3FREE, THYROIDAB in the last 72 hours.  Invalid input(s): FREET3 Anemia work up No results for input(s): VITAMINB12, FOLATE, FERRITIN, TIBC, IRON, RETICCTPCT in the last 72  hours. Microbiology Recent Results (from the past 240 hour(s))  Resp Panel by RT-PCR (Flu A&B, Covid) Nasopharyngeal Swab     Status: None   Collection Time: 09/26/21  7:55 AM   Specimen: Nasopharyngeal Swab; Nasopharyngeal(NP) swabs in vial transport medium  Result Value Ref Range Status   SARS Coronavirus 2 by RT PCR NEGATIVE NEGATIVE Final    Comment: (NOTE) SARS-CoV-2 target nucleic acids are NOT DETECTED.  The SARS-CoV-2 RNA is generally detectable in upper respiratory specimens during the acute phase of infection. The lowest concentration of SARS-CoV-2 viral copies this assay can detect is 138 copies/mL. A negative result does not preclude SARS-Cov-2 infection and should not be used as the sole basis for treatment or other patient management decisions. A negative result may occur with  improper specimen collection/handling, submission of specimen other than nasopharyngeal swab, presence of viral mutation(s) within the areas targeted by this assay, and inadequate number of viral copies(<138 copies/mL). A negative result must be combined with clinical observations, patient history, and epidemiological information. The expected result is Negative.  Fact Sheet for Patients:  EntrepreneurPulse.com.au  Fact Sheet for Healthcare Providers:  IncredibleEmployment.be  This test is no t yet approved or cleared by the [place] FDA and  has been authorized for detection and/or diagnosis of SARS-CoV-2 by FDA under an Emergency Use Authorization (EUA). This EUA will remain  in effect (meaning this test can be used) for the duration of the COVID-19 declaration under Section 564(b)(1) of the Act, 21 U.S.C.section 360bbb-3(b)(1), unless the authorization is terminated  or revoked sooner.       Influenza A by PCR NEGATIVE NEGATIVE Final   Influenza B by PCR NEGATIVE NEGATIVE Final    Comment: (NOTE) The Xpert Xpress SARS-CoV-2/FLU/RSV plus assay  is intended as an aid in the diagnosis of influenza from Nasopharyngeal swab specimens and should not be used as a sole basis for treatment. Nasal washings and aspirates are unacceptable for Xpert Xpress SARS-CoV-2/FLU/RSV testing.  Fact Sheet for Patients: EntrepreneurPulse.com.au  Fact Sheet for Healthcare Providers: IncredibleEmployment.be  This test is not yet approved or cleared by the [place] FDA and has been authorized for detection and/or diagnosis of SARS-CoV-2 by FDA under an Emergency Use Authorization (EUA). This EUA will remain in effect (meaning this test can be used) for the duration of the COVID-19 declaration under Section 564(b)(1) of the Act, 21 U.S.C. section 360bbb-3(b)(1), unless the authorization is terminated or revoked.  Performed at [facility], [place] [address] [place], [place] [address]      Signed: Terrilee Croak  [facility] 09/27/2021, 12:47 PM

## 2021-09-27 NOTE — Progress Notes (Signed)
Speech Language Pathology Treatment: Dysphagia  Patient Details Name: Kari [name] MRN: [id number] DOB: 1933/12/03 Today's Date: 09/27/2021 Time: 9983-3825 SLP Time Calculation (min) (ACUTE ONLY): 12 min  Assessment / Plan / Recommendation Clinical Impression  Pt was seen for dysphagia treatment with her family present. Pt's family and RN reported that the pt has been tolerating the current diet without difficulty. Per the family, the pt has been spelling words and her speech is less clear than at baseline. Pt was lethargic throughout the session despite verbal and tactile stimulation. Bolus manipulation was absent and boluses were ultimately removed from the oral cavity. Considering reports from family and RN, this was likely due to pt's lethargy. Pt's family was educated regarding the need for thickener use until further assessment can be conducted. Methods for thickening to nectar thick consistency were discussed; pt's family verbalized understanding regarding this and the need for continued SLP services after discharge. SLP will continue to follow pt.     HPI HPI: 88yo female admitted 09/25/21 with AMS with weakness, difficulty speaking. PMH: AFib, HTN, CVAs, SDH, HLD. MRI = acute CVA of the right medial superior cerebellar hemisphere and the left parietal cortex.      SLP Plan  Continue with current plan of care      Recommendations for follow up therapy are one component of a multi-disciplinary discharge planning process, led by the attending physician.  Recommendations may be updated based on patient status, additional functional criteria and insurance authorization.    Recommendations  Diet recommendations: Dysphagia 2 (fine chop);Nectar-thick liquid Liquids provided via: Cup;Straw Medication Administration: Crushed with puree Supervision: Staff to assist with self feeding Compensations: Minimize environmental distractions;Slow rate;Small sips/bites Postural Changes and/or  Swallow Maneuvers: Seated upright 90 degrees                Oral Care Recommendations: Oral care BID Follow Up Recommendations: Home health SLP Assistance recommended at discharge: Frequent or constant Supervision/Assistance SLP Visit Diagnosis: Dysphagia, unspecified (R13.10) Plan: Continue with current plan of care        Gaylene Moylan I. Hardin Negus, [place], [place] Office number [phone] Pager [place]  09/27/2021, 4:01 PM

## 2021-09-27 NOTE — Progress Notes (Signed)
Physical Therapy Treatment Patient Details Name: Kari [name] MRN: [id number] DOB: 1933-09-27 Today's Date: 09/27/2021   History of Present Illness 88 yo female [facility] with vomitying, weakness, worsening AMS. MRI reveals acute ischemic multifocal, multiple vascular territories embolic CVAs; more specifically R medial superior cerebellum and L parietal cortex. PMH includes dementia, HLD, HTN, CVA, SDH, osteoporosis.    PT Comments    Patient seen with focus of treatment on daughter participating in helping with mobility and for education on positioning.  She was able to get up to EOB with mod A and transfer to chair with RW and daughter assisting with PT.  Daughter reports her brother and daughter will assist pt at d/c.  Feel she will progress with follow up HHPT.  Slated for d/c today, but will follow up if not d/c.    Recommendations for follow up therapy are one component of a multi-disciplinary discharge planning process, led by the attending physician.  Recommendations may be updated based on patient status, additional functional criteria and insurance authorization.  Follow Up Recommendations  Home health PT     Assistance Recommended at Discharge    Patient can return home with the following A lot of help with bathing/dressing/bathroom;Two people to help with walking and/or transfers;Assist for transportation;Help with stairs or ramp for entrance;Assistance with Education officer, environmental (measurements PT);Wheelchair cushion (measurements PT);BSC/3in1    Recommendations for Other Services       Precautions / Restrictions Precautions Precautions: Fall     Mobility  Bed Mobility Overal bed mobility: Needs Assistance Bed Mobility: Rolling, Sidelying to Sit Rolling: Mod assist Sidelying to sit: Mod assist   Sit to supine: Mod assist   General bed mobility comments: cues to reach for rail with hands and assist to flex opposite  knee; assist for L LE off bed and to assist to lift trunk used bed pad to scoot out.  to supine assist for legs into bed    Transfers Overall transfer level: Needs assistance Equipment used: Rolling walker (2 wheels) Transfers: Sit to/from Stand, Bed to chair/wheelchair/BSC Sit to Stand: Mod assist, +2 safety/equipment   Step pivot transfers: Mod assist, +2 safety/equipment       General transfer comment: daughter present and participated in step pivot to chair at bedside with A for balance, facilitation for movement at hips and educated daughter how to use gait belt and assist with +2 with only one person giving commands    Ambulation/Gait                   Stairs             Wheelchair Mobility    Modified Rankin (Stroke Patients Only) Modified Rankin (Stroke Patients Only) Pre-Morbid Rankin Score: Moderately severe disability Modified Rankin: Moderately severe disability     Balance Overall balance assessment: Needs assistance Sitting-balance support: Feet supported Sitting balance-Leahy Scale: Fair     Standing balance support: Bilateral upper extremity supported Standing balance-Leahy Scale: Poor Standing balance comment: UE support and A for balance                            Cognition Arousal/Alertness: Lethargic Behavior During Therapy: Flat affect Overall Cognitive Status: History of cognitive impairments - at baseline  General Comments: h/o dementia, has been sleeping much of the day, alert with mobility and participative with multimodal cues        Exercises      General Comments General comments (skin integrity, edema, etc.): daughter present and educated on skin protection with turning and floating heels, reports trying to get into wound clinic for wound on R foot since prior to her father's death in 14-Sep-2023.      Pertinent Vitals/Pain Pain Assessment Pain Assessment:  Faces Faces Pain Scale: No hurt    Home Living                          Prior Function            PT Goals (current goals can now be found in the care plan section) Progress towards PT goals: Progressing toward goals    Frequency    Min 3X/week      PT Plan Current plan remains appropriate    Co-evaluation              AM-PAC PT "6 Clicks" Mobility   Outcome Measure  Help needed turning from your back to your side while in a flat bed without using bedrails?: A Lot Help needed moving from lying on your back to sitting on the side of a flat bed without using bedrails?: A Lot Help needed moving to and from a bed to a chair (including a wheelchair)?: A Lot Help needed standing up from a chair using your arms (e.g., wheelchair or bedside chair)?: A Lot Help needed to walk in hospital room?: Total Help needed climbing 3-5 steps with a railing? : Total 6 Click Score: 10    End of Session Equipment Utilized During Treatment: Gait belt Activity Tolerance: Patient tolerated treatment well Patient left: in bed;with call bell/phone within reach   PT Visit Diagnosis: Other abnormalities of gait and mobility (R26.89);Muscle weakness (generalized) (M62.81);Difficulty in walking, not elsewhere classified (R26.2)     Time: 6378-5885 PT Time Calculation (min) (ACUTE ONLY): 20 min  Charges:  $Self Care/Home Management: [place], PT Acute Rehabilitation Services [phone] Office:[phone] 09/27/2021    Reginia Naas 09/27/2021, 5:15 PM

## 2021-09-27 NOTE — Discharge Instructions (Signed)

## 2021-09-27 NOTE — TOC Transition Note (Signed)
Transition of Care [facility]) - CM/SW Discharge Note   Patient Details  Name: Kari [name] MRN: [id number] Date of Birth: 02-Nov-1933  Transition of Care [facility]) CM/SW Contact:  Pollie Friar, RN Phone Number: 09/27/2021, 12:55 PM   Clinical Narrative:    Patient discharging home with home health services through [facility]. Information on the AVS.  Wheelchair and hospital bed ordered for home. Bed to be delivered tomorrow and wheelchair delivered to the room per [employer]. Family requesting PTAR home. CM has arranged transport. Bedside RN updated and discharge packet is at the desk.    Final next level of care: Home w Home Health Services Barriers to Discharge: No Barriers Identified   Patient Goals and CMS Choice   CMS Medicare.gov Compare Post Acute Care list provided to:: Patient Represenative (must comment) Choice offered to / list presented to : Adult Children  Discharge Placement                       Discharge Plan and Services                DME Arranged: Hospital bed, Lightweight manual wheelchair with seat cushion DME Agency: [employer] Date DME Agency Contacted: 09/27/21   Representative spoke with at DME Agency: Freda Munro HH Arranged: RN, PT, OT, Nurse's Aide, Social Work, Speech Therapy HH Agency: [place] Date [place]: 09/27/21   Representative spoke with at [place]: [place] ([place]) Interventions     Readmission Risk Interventions No flowsheet data found.

## 2021-09-27 NOTE — Evaluation (Signed)
Occupational Therapy Evaluation Patient Details Name: Kari [name] MRN: [id number] DOB: 1933/12/23 Today's Date: 09/27/2021   History of Present Illness 88 yo female presents to [facility] on 1/23 with vomitying, weakness, worsening AMS. MRI reveals acute ischemic multifocal, multiple vascular territories embolic CVAs; more specifically R medial superior cerebellum and L parietal cortex. PMH includes dementia, HLD, HTN, CVA, SDH, osteoporosis.   Clinical Impression   Kari [name] required assist PTA, her family is extremely supportive and assisted her with transfers, ADLs, cognition and mobility as needed. She lives in a 1 level home, 1 STE with her children to assist 24/7 as needed. Upon arrival, pt was extremely lethargic, pt's son stated she normally doesn't fully wake up for the day until noon. Pt required max A to transfer to sitting EOB with fair sitting balance for ~10 minutes, no change in arousal level. Overall pt required max A - total A for all ADLs at bed level this session. Reviewed education with family: safe position for transfers, use of gait belt, positioning of BSC, use of chuck pads and check for skin integrity, bed level ADLs. They verbalized great understanding & confirmed that they will have +2 or 3 available. Pt would benefit from continued OT acutely however she has active d/c plans for home today. Recommend max HH services including OT and aide if able.    Recommendations for follow up therapy are one component of a multi-disciplinary discharge planning process, led by the attending physician.  Recommendations may be updated based on patient status, additional functional criteria and insurance authorization.   Follow Up Recommendations  Home health OT    Assistance Recommended at Discharge Frequent or constant Supervision/Assistance  Patient can return home with the following A lot of help with walking and/or transfers;Two people to help with walking and/or transfers;A lot of help with  bathing/dressing/bathroom;Two people to help with bathing/dressing/bathroom;Direct supervision/assist for medications management;Assist for transportation;Help with stairs or ramp for entrance    Functional Status Assessment  Patient has had a recent decline in their functional status and demonstrates the ability to make significant improvements in function in a reasonable and predictable amount of time.  Equipment Recommendations  Hospital bed;Wheelchair (measurements OT);Wheelchair cushion (measurements OT)       Precautions / Restrictions Precautions Precautions: Fall Restrictions Weight Bearing Restrictions: No      Mobility Bed Mobility Overal bed mobility: Needs Assistance Bed Mobility: Rolling, Sidelying to Sit Rolling: Max assist Sidelying to sit: Max assist       General bed mobility comments: due to level of arousal, once sitting pt was min G for sitting balance    Transfers Overall transfer level: Needs assistance                 General transfer comment: defer due to level of arousal      Balance Overall balance assessment: Needs assistance, History of Falls Sitting-balance support: Feet supported Sitting balance-Leahy Scale: Fair                       ADL either performed or assessed with clinical judgement   ADL Overall ADL's : Needs assistance/impaired Eating/Feeding: Maximal assistance;Sitting   Grooming: Maximal assistance;Sitting   Upper Body Bathing: Maximal assistance;Sitting   Lower Body Bathing: Maximal assistance;Bed level   Upper Body Dressing : Maximal assistance;Sitting   Lower Body Dressing: Maximal assistance;Bed level   Toilet Transfer: Total assistance   Toileting- Clothing Manipulation and Hygiene: Total assistance  Functional mobility during ADLs: Maximal assistance;Total assistance General ADL Comments: high assist levels due to level of arousal this session. Pt extrmely lethargic, not following any  commands. Able to maintain sitting balance with close min guard. LB tasks at bed level due to arousal.     Vision Patient Visual Report: No change from baseline (per families report) Additional Comments: difficult to assess due to level of arousal. Pt with constand L head turn            Pertinent Vitals/Pain Pain Assessment Pain Assessment: Faces Faces Pain Scale: No hurt Pain Intervention(s): Monitored during session     Hand Dominance Right   Extremity/Trunk Assessment Upper Extremity Assessment Upper Extremity Assessment: Generalized weakness;Difficult to assess due to impaired cognition (pt extremely sleepy this session, not following commands.)   Lower Extremity Assessment Lower Extremity Assessment: Defer to PT evaluation   Cervical / Trunk Assessment Cervical / Trunk Assessment: Kyphotic   Communication Communication Communication: Expressive difficulties   Cognition Arousal/Alertness: Lethargic Behavior During Therapy: Flat affect Overall Cognitive Status: History of cognitive impairments - at baseline           General Comments: history of dementia, pt very lethargic this session - eyes open 25% of the session. Only followed 1 command due to lethargy.     General Comments  VSS on RA, pt extremely lethargic. Pt son and daugther present and supportive.            Home Living Family/patient expects to be discharged to:: Private residence Living Arrangements: Children Available Help at Discharge: Family;Available 24 hours/day Type of Home: House Home Access: Stairs to enter [employer] of Steps: 1   Home Layout: One level     Bathroom Shower/Tub: Gaffer (with chair)   Bathroom Toilet: Standard     Home Equipment: Conservation officer, nature (2 wheels);Cane - single point   Additional Comments: 3 supportive children      Prior Functioning/Environment Prior Level of Function : Needs assist  Cognitive Assist : ADLs (cognitive);Mobility  (cognitive) Mobility (Cognitive): Step by step cues ADLs (Cognitive): Step by step cues Physical Assist : Mobility (physical);ADLs (physical) Mobility (physical): Bed mobility;Transfers;Gait;Stairs ADLs (physical): Grooming;Bathing;Dressing;Toileting;IADLs Mobility Comments: pt's family states pt requires support when getting into/out of bed, usually likes to be as independent as possible with transfers and gait with cane or RW ADLs Comments: pt's daughter assists with dressing, bathing, toileting; pt wears briefs at night and sleeps through the night per son and daughter report. Pt's husband used to assist pt with all mobility and ADLs, passed x2 months ago so children have taken over 24/7 assist.        OT Problem List: Decreased strength;Decreased range of motion;Decreased activity tolerance;Impaired balance (sitting and/or standing);Decreased cognition;Decreased safety awareness;Decreased knowledge of precautions      OT Treatment/Interventions: Self-care/ADL training;Therapeutic exercise;Balance training;Patient/family education;Therapeutic activities;DME and/or AE instruction    OT Goals(Current goals can be found in the care plan section) Acute Rehab OT Goals Patient Stated Goal: unable to state OT Goal Formulation: With patient Time For Goal Achievement: 10/11/21 Potential to Achieve Goals: Good  OT Frequency: Min 2X/week       AM-PAC OT "6 Clicks" Daily Activity     Outcome Measure Help from another person eating meals?: A Lot Help from another person taking care of personal grooming?: A Lot Help from another person toileting, which includes using toliet, bedpan, or urinal?: Total Help from another person bathing (including washing, rinsing, drying)?: A Lot Help from another  person to put on and taking off regular upper body clothing?: A Lot Help from another person to put on and taking off regular lower body clothing?: Total 6 Click Score: 10   End of Session Nurse  Communication: Mobility status  Activity Tolerance: Patient limited by lethargy Patient left: in bed;with call bell/phone within reach;with bed alarm set;with nursing/sitter in room;with family/visitor present  OT Visit Diagnosis: Unsteadiness on feet (R26.81);Other abnormalities of gait and mobility (R26.89);Muscle weakness (generalized) (M62.81);History of falling (Z91.81)                Time: 2297-9892 OT Time Calculation (min): 26 min Charges:  OT General Charges $OT Visit: 1 Visit OT Evaluation $OT Eval Moderate Complexity: 1 Mod OT Treatments $Self Care/Home Management : 8-22 mins  Baileigh Modisette A Denisa Enterline 09/27/2021, 11:46 AM

## 2021-09-27 NOTE — Progress Notes (Cosign Needed Addendum)
Wheelchair: Patient suffers from stroke which impairs their ability to perform daily activities like bathing, dressing, grooming, and toileting in the home.  A walker will not resolve  issue with performing activities of daily living. A wheelchair will allow patient to safely perform daily activities. Patient is not able to propel themselves in the home using a standard weight wheelchair due to general weakness. Patient can self propel in the lightweight wheelchair. Length of need 6 months .  Accessories: elevating leg rests (ELRs), wheel locks, extensions and anti-tippers.

## 2021-09-27 NOTE — Progress Notes (Cosign Needed Addendum)
Hospital bed:  Length of Need 6 Months  The above medical condition requires: Patient requires the ability to reposition frequently  Head must be elevated greater than: 30 degrees  Bed type Semi-electric  Support Surface: Gel Overlay    

## 2021-09-27 NOTE — Plan of Care (Signed)

## 2021-09-28 ENCOUNTER — Telehealth: Payer: Self-pay

## 2021-09-28 DIAGNOSIS — K5900 Constipation, unspecified: Secondary | ICD-10-CM | POA: Diagnosis not present

## 2021-09-28 DIAGNOSIS — E785 Hyperlipidemia, unspecified: Secondary | ICD-10-CM | POA: Diagnosis not present

## 2021-09-28 DIAGNOSIS — R32 Unspecified urinary incontinence: Secondary | ICD-10-CM | POA: Diagnosis not present

## 2021-09-28 DIAGNOSIS — E669 Obesity, unspecified: Secondary | ICD-10-CM | POA: Diagnosis not present

## 2021-09-28 DIAGNOSIS — I10 Essential (primary) hypertension: Secondary | ICD-10-CM | POA: Diagnosis not present

## 2021-09-28 DIAGNOSIS — H40003 Preglaucoma, unspecified, bilateral: Secondary | ICD-10-CM | POA: Diagnosis not present

## 2021-09-28 DIAGNOSIS — M810 Age-related osteoporosis without current pathological fracture: Secondary | ICD-10-CM | POA: Diagnosis not present

## 2021-09-28 DIAGNOSIS — H25012 Cortical age-related cataract, left eye: Secondary | ICD-10-CM | POA: Diagnosis not present

## 2021-09-28 DIAGNOSIS — H269 Unspecified cataract: Secondary | ICD-10-CM | POA: Diagnosis not present

## 2021-09-28 DIAGNOSIS — M40209 Unspecified kyphosis, site unspecified: Secondary | ICD-10-CM | POA: Diagnosis not present

## 2021-09-28 DIAGNOSIS — Z9181 History of falling: Secondary | ICD-10-CM | POA: Diagnosis not present

## 2021-09-28 DIAGNOSIS — H2512 Age-related nuclear cataract, left eye: Secondary | ICD-10-CM | POA: Diagnosis not present

## 2021-09-28 DIAGNOSIS — Z8673 Personal history of transient ischemic attack (TIA), and cerebral infarction without residual deficits: Secondary | ICD-10-CM | POA: Diagnosis not present

## 2021-09-28 DIAGNOSIS — I482 Chronic atrial fibrillation, unspecified: Secondary | ICD-10-CM | POA: Diagnosis not present

## 2021-09-28 DIAGNOSIS — I951 Orthostatic hypotension: Secondary | ICD-10-CM | POA: Diagnosis not present

## 2021-09-28 DIAGNOSIS — Z86010 Personal history of colonic polyps: Secondary | ICD-10-CM | POA: Diagnosis not present

## 2021-09-28 DIAGNOSIS — I700 Atherosclerosis of aorta: Secondary | ICD-10-CM | POA: Diagnosis not present

## 2021-09-28 DIAGNOSIS — G8194 Hemiplegia, unspecified affecting left nondominant side: Secondary | ICD-10-CM | POA: Diagnosis not present

## 2021-09-28 DIAGNOSIS — Z6826 Body mass index (BMI) 26.0-26.9, adult: Secondary | ICD-10-CM | POA: Diagnosis not present

## 2021-09-28 DIAGNOSIS — H353130 Nonexudative age-related macular degeneration, bilateral, stage unspecified: Secondary | ICD-10-CM | POA: Diagnosis not present

## 2021-09-28 NOTE — Telephone Encounter (Signed)
Transition Care Management Unsuccessful Follow-up Telephone Call  Date of discharge and from where:  [place] 09-27-21 Dx: stroke  Attempts:  1st Attempt  Reason for unsuccessful TCM follow-up call:  Left voice message

## 2021-09-29 ENCOUNTER — Telehealth: Payer: Self-pay | Admitting: Family Medicine

## 2021-09-29 NOTE — Telephone Encounter (Signed)
Transition Care Management Unsuccessful Follow-up Telephone Call  Date of discharge and from where:  09/27/21 [name] - [facility]  Attempts:  2nd Attempt  Reason for unsuccessful TCM follow-up call:  Left voice message  She does already have TCM appt for 10/03/21 @ 11:50 with Darla Lesches, but I just need to ask her the TCM questions

## 2021-10-02 NOTE — Telephone Encounter (Signed)
Transition Care Management Unsuccessful Follow-up Telephone Call  Date of discharge and from where:  [place] 09-27-21 Dx: stroke  Attempts:  3rd Attempt  Reason for unsuccessful TCM follow-up call:  Left voice message-

## 2021-10-03 ENCOUNTER — Ambulatory Visit (INDEPENDENT_AMBULATORY_CARE_PROVIDER_SITE_OTHER): Payer: Medicare Other | Admitting: Family Medicine

## 2021-10-03 ENCOUNTER — Encounter: Payer: Self-pay | Admitting: Family Medicine

## 2021-10-03 VITALS — BP 162/98 | HR 81 | Temp 97.1°F | Ht 62.0 in | Wt 152.0 lb

## 2021-10-03 DIAGNOSIS — I6389 Other cerebral infarction: Secondary | ICD-10-CM

## 2021-10-03 DIAGNOSIS — I10 Essential (primary) hypertension: Secondary | ICD-10-CM | POA: Diagnosis not present

## 2021-10-03 DIAGNOSIS — E876 Hypokalemia: Secondary | ICD-10-CM | POA: Diagnosis not present

## 2021-10-03 DIAGNOSIS — I6930 Unspecified sequelae of cerebral infarction: Secondary | ICD-10-CM

## 2021-10-03 DIAGNOSIS — M810 Age-related osteoporosis without current pathological fracture: Secondary | ICD-10-CM | POA: Diagnosis not present

## 2021-10-03 DIAGNOSIS — I482 Chronic atrial fibrillation, unspecified: Secondary | ICD-10-CM

## 2021-10-03 DIAGNOSIS — Z7409 Other reduced mobility: Secondary | ICD-10-CM | POA: Diagnosis not present

## 2021-10-03 DIAGNOSIS — Z7901 Long term (current) use of anticoagulants: Secondary | ICD-10-CM | POA: Diagnosis not present

## 2021-10-03 DIAGNOSIS — I69391 Dysphagia following cerebral infarction: Secondary | ICD-10-CM | POA: Diagnosis not present

## 2021-10-03 DIAGNOSIS — H269 Unspecified cataract: Secondary | ICD-10-CM | POA: Diagnosis not present

## 2021-10-03 DIAGNOSIS — Z09 Encounter for follow-up examination after completed treatment for conditions other than malignant neoplasm: Secondary | ICD-10-CM | POA: Diagnosis not present

## 2021-10-03 DIAGNOSIS — M40209 Unspecified kyphosis, site unspecified: Secondary | ICD-10-CM | POA: Diagnosis not present

## 2021-10-03 DIAGNOSIS — E785 Hyperlipidemia, unspecified: Secondary | ICD-10-CM | POA: Diagnosis not present

## 2021-10-03 MED ORDER — XARELTO 1 MG/ML PO SUSR: 15.0000 mg | Freq: Every day | ORAL | 6 refills | Status: DC

## 2021-10-03 MED ORDER — POTASSIUM CHLORIDE 20 MEQ/15ML (10%) PO SOLN: 10.0000 meq | Freq: Every day | ORAL | 0 refills | Status: DC

## 2021-10-03 NOTE — Progress Notes (Signed)
Subjective:  Patient ID: Kari [name], female    DOB: 05/22/1934, 88 y.o.   MRN: [id number]  Patient Care Team: [name], DO as PCP - General (Family Medicine) Minus Breeding, MD as Consulting Physician (Cardiology) Gatha Mayer, MD as Consulting Physician (Gastroenterology) Penni Bombard, MD as Consulting Physician (Neurology)   Chief Complaint:  Transitions Of Care   HPI: Kari [name] is a 88 y.o. female presenting on 10/03/2021 for Transitions Of Care   Pt presents today for hospital discharge follow up. She was admitted to [facility] on 09/25/2021 and discharged home 09/27/2021. She was out with family and developed altered mental status, trouble walking, and dysarthria so she was taken to [facility] ED. MRI revealed acute embolic infarcts and pt was admitted for treatment. She was previously on Coumadin for her A-Fib, had a stroke on this and was switched to Eliquis which she was on at the time of this event. The CVA has caused dysarthria, dysphagia, and fatigue. She was sent home on Pradaxa but has not been able to take due to dysphagia. States home health with OT and PT are scheduled to come evaluate pt but have not to date. Family is thickening her liquids at home and crushing all other medications. Unable to crush potassium and Pradaxa so pt needs alternative. Family states she has been very tired since being home and will sleep until lunch time most days. She is eating and drinking ok. She still has generalized weakness and gets around with her walker. Potassium and calcium were low at time of discharge.     Relevant past medical, surgical, family, and social history reviewed and updated as indicated.  Allergies and medications reviewed and updated. Data reviewed: Chart in Epic.   Past Medical History:  Diagnosis Date   Adenomatous polyps    AF (atrial fibrillation) (HCC)    Cataract    Gallstones    Hx of adenomatous colonic polyps    Hyperlipidemia    x5 years    Hypertension    x5 years   Kyphosis    Metabolic syndrome X    Obesity    Osteoporosis    Stroke ([place]) 07/2017   Subdural hematoma    Symptomatic menopausal or female climacteric states    Vitamin D deficiency     Past Surgical History:  Procedure Laterality Date   ABDOMINAL HYSTERECTOMY     BLADDER SUSPENSION     COLONOSCOPY  multiple   ESOPHAGEAL DILATION N/A 03/03/2018   Procedure: ESOPHAGEAL DILATION;  Surgeon: Rogene Houston, MD;  Location: [facility];  Service: Endoscopy;  Laterality: N/A;   ESOPHAGOGASTRODUODENOSCOPY N/A 03/03/2018   Procedure: ESOPHAGOGASTRODUODENOSCOPY (EGD);  Surgeon: Rogene Houston, MD;  Location: [facility];  Service: Endoscopy;  Laterality: N/A;   EYE SURGERY Bilateral 09/2015   VAGINAL HYSTERECTOMY     total    Social History   Socioeconomic History   Marital status: Widowed    Spouse name: Ollen Gross    Number of children: 2   Years of education: Not on file   Highest education level: Not on file  Occupational History   Occupation: Psychologist, sport and exercise  Tobacco Use   Smoking status: Never   Smokeless tobacco: Never  Vaping Use   Vaping Use: Never used  Substance and Sexual Activity   Alcohol use: No   Drug use: No   Sexual activity: Not on file  Other Topics Concern   Not on file  Social History Narrative   Lives with her son, husband died Sep 08, 2021.   1 son Surveyor, minerals)   1 Daughter Contractor)   2 grandchildren and 3 great-grandchild   12th grade education   Social Determinants of Health   Financial Resource Strain: Low Risk    Difficulty of Paying Living Expenses: Not hard at all  Food Insecurity: No Food Insecurity   Worried About Charity fundraiser in the Last Year: Never true   Arboriculturist in the Last Year: Never true  Transportation Needs: No Transportation Needs   Lack of Transportation (Medical): No   Lack of Transportation (Non-Medical): No  Physical Activity: Insufficiently Active   Days of Exercise per  Week: 3 days   Minutes of Exercise per Session: 30 min  Stress: No Stress Concern Present   Feeling of Stress : Not at all  Social Connections: Moderately Integrated   Frequency of Communication with Friends and Family: More than three times a week   Frequency of Social Gatherings with Friends and Family: More than three times a week   Attends Religious Services: More than 4 times per year   Active Member of [employer] or Organizations: Yes   Attends Archivist Meetings: More than 4 times per year   Marital Status: Widowed  Intimate Partner Violence: Not At Risk   Fear of Current or Ex-Partner: No   Emotionally Abused: No   Physically Abused: No   Sexually Abused: No    Outpatient Encounter Medications as of 10/03/2021  Medication Sig   atenolol (TENORMIN) 50 MG tablet TAKE 1 TABLET DAILY (Patient taking differently: Take 50 mg by mouth daily.)   Benfotiamine 150 MG CAPS Take 150 mg by mouth daily.   captopril (CAPOTEN) 50 MG tablet TAKE 1 TABLET BY MOUTH 2 TIMES DAILY FOR HIGH BLOOD PRESSURE. (Patient taking differently: Take 50 mg by mouth 2 (two) times daily.)   ezetimibe (ZETIA) 10 MG tablet TAKE 1 TABLET ONCE DAILY FOR CHOLESTEROL (Patient taking differently: Take 10 mg by mouth daily.)   loratadine (CLARITIN) 10 MG tablet Take 1 tablet (10 mg total) by mouth daily.   Multiple Vitamins-Minerals (ZINC PO) Take 1 tablet by mouth daily.   potassium chloride 20 MEQ/15ML (10%) SOLN Take 7.5 mLs (10 mEq total) by mouth daily.   pravastatin (PRAVACHOL) 80 MG tablet TAKE 1 TABLET ONCE DAILY FOR CHOLESTEROL (Patient taking differently: Take 80 mg by mouth daily.)   Rivaroxaban (XARELTO) 1 MG/ML SUSR Take 15 mg by mouth daily.   rivastigmine (EXELON) 4.6 mg/24hr Place 1 patch (4.6 mg total) onto the skin daily.   vitamin C (ASCORBIC ACID) 500 MG tablet Take 500 mg by mouth daily.   [DISCONTINUED] dabigatran (PRADAXA) 150 MG CAPS capsule Take 1 capsule (150 mg total) by mouth every 12  (twelve) hours.   [DISCONTINUED] fluconazole (DIFLUCAN) 150 MG tablet Take one tablet by mouth now. Repeat dose in 7 days.   [DISCONTINUED] potassium chloride (KLOR-CON) 10 MEQ tablet TAKE 1 TABLET DAILY (Patient taking differently: Take 10 mEq by mouth daily.)   No facility-administered encounter medications on file as of 10/03/2021.    Allergies  Allergen Reactions   Erythromycin Other (See Comments)    Reaction unknown   Penicillins Other (See Comments)    Reaction unknown  Has patient had a PCN reaction causing immediate rash, facial/tongue/throat swelling, SOB or lightheadedness with hypotension: unknown Has patient had a PCN reaction causing severe rash involving mucus membranes or skin necrosis:  unknown Has patient had a PCN reaction that required hospitalization : unknown Has patient had a PCN reaction occurring within the last 10 years: no If all of the above answers are "NO", then may proceed with Cephalosporin use.     Review of Systems  Unable to perform ROS: Patient nonverbal       Objective:  BP (!) 162/98    Pulse 81    Temp (!) 97.1 F (36.2 C) (Temporal)    Ht $R'5\' 2"'Lc$  (1.575 m)    Wt 152 lb (68.9 kg)    SpO2 97%    BMI 27.80 kg/m    Wt Readings from Last 3 Encounters:  10/03/21 152 lb (68.9 kg)  09/26/21 163 lb 9.3 oz (74.2 kg)  08/30/21 156 lb (70.8 kg)    Physical Exam Vitals and nursing note reviewed.  Constitutional:      General: She is not in acute distress.    Appearance: She is ill-appearing (chronically). She is not toxic-appearing or diaphoretic.  HENT:     Head: Normocephalic and atraumatic.     Mouth/Throat:     Mouth: Mucous membranes are moist.     Pharynx: Oropharynx is clear.  Eyes:     Conjunctiva/sclera: Conjunctivae normal.     Pupils: Pupils are equal, round, and reactive to light.  Cardiovascular:     Rate and Rhythm: Normal rate. Rhythm irregularly irregular.     Heart sounds: No murmur heard. Pulmonary:     Effort: Pulmonary  effort is normal.     Breath sounds: Normal breath sounds.  Abdominal:     General: Bowel sounds are normal.     Palpations: Abdomen is soft.  Musculoskeletal:     Cervical back: Normal range of motion and neck supple.     Right lower leg: No edema.     Left lower leg: No edema.  Skin:    General: Skin is warm and dry.     Capillary Refill: Capillary refill takes less than 2 seconds.  Neurological:     Mental Status: She is alert. She is disoriented.     Motor: No weakness (bilateral grip strength equal, bilateral leg strength equal).     Gait: Gait abnormal (using walker).     Comments: Nonverbal in office, only mumbling. Will shake head yes or no to answer questions  Psychiatric:        Attention and Perception: Attention normal.        Mood and Affect: Affect is flat.        Speech: She is noncommunicative.        Behavior: Behavior is cooperative.        Cognition and Memory: Cognition is impaired. Memory is impaired.    Results for orders placed or performed during the hospital encounter of 09/25/21  Resp Panel by RT-PCR (Flu A&B, Covid) Nasopharyngeal Swab   Specimen: Nasopharyngeal Swab; Nasopharyngeal(NP) swabs in vial transport medium  Result Value Ref Range   SARS Coronavirus 2 by RT PCR NEGATIVE NEGATIVE   Influenza A by PCR NEGATIVE NEGATIVE   Influenza B by PCR NEGATIVE NEGATIVE  Protime-INR  Result Value Ref Range   Prothrombin Time 16.7 (H) 11.4 - 15.2 seconds   INR 1.4 (H) 0.8 - 1.2  APTT  Result Value Ref Range   aPTT 38 (H) 24 - 36 seconds  CBC  Result Value Ref Range   WBC 10.9 (H) 4.0 - 10.5 K/uL   RBC 4.81 3.87 - 5.11 MIL/uL  Hemoglobin 14.4 12.0 - 15.0 g/dL   HCT 45.3 36.0 - 46.0 %   MCV 94.2 80.0 - 100.0 fL   MCH 29.9 26.0 - 34.0 pg   MCHC 31.8 30.0 - 36.0 g/dL   RDW 14.7 11.5 - 15.5 %   Platelets 231 150 - 400 K/uL   nRBC 0.0 0.0 - 0.2 %  Differential  Result Value Ref Range   Neutrophils Relative % 86 %   Neutro Abs 9.3 (H) 1.7 - 7.7  K/uL   Lymphocytes Relative 9 %   Lymphs Abs 1.0 0.7 - 4.0 K/uL   Monocytes Relative 4 %   Monocytes Absolute 0.5 0.1 - 1.0 K/uL   Eosinophils Relative 0 %   Eosinophils Absolute 0.0 0.0 - 0.5 K/uL   Basophils Relative 0 %   Basophils Absolute 0.0 0.0 - 0.1 K/uL   Immature Granulocytes 1 %   Abs Immature Granulocytes 0.05 0.00 - 0.07 K/uL  Comprehensive metabolic panel  Result Value Ref Range   Sodium 135 135 - 145 mmol/L   Potassium 4.3 3.5 - 5.1 mmol/L   Chloride 105 98 - 111 mmol/L   CO2 22 22 - 32 mmol/L   Glucose, Bld 146 (H) 70 - 99 mg/dL   BUN 7 (L) 8 - 23 mg/dL   Creatinine, Ser 0.51 0.44 - 1.00 mg/dL   Calcium 8.5 (L) 8.9 - 10.3 mg/dL   Total Protein 6.6 6.5 - 8.1 g/dL   Albumin 3.4 (L) 3.5 - 5.0 g/dL   AST 27 15 - 41 U/L   ALT 10 0 - 44 U/L   Alkaline Phosphatase 68 38 - 126 U/L   Total Bilirubin 1.3 (H) 0.3 - 1.2 mg/dL   GFR, Estimated >60 >60 mL/min   Anion gap 8 5 - 15  Lipase, blood  Result Value Ref Range   Lipase 25 11 - 51 U/L  Urinalysis, Routine w reflex microscopic  Result Value Ref Range   Color, Urine YELLOW YELLOW   APPearance CLEAR CLEAR   Specific Gravity, Urine 1.025 1.005 - 1.030   pH 7.0 5.0 - 8.0   Glucose, UA NEGATIVE NEGATIVE mg/dL   Hgb urine dipstick NEGATIVE NEGATIVE   Bilirubin Urine NEGATIVE NEGATIVE   Ketones, ur NEGATIVE NEGATIVE mg/dL   Protein, ur NEGATIVE NEGATIVE mg/dL   Nitrite NEGATIVE NEGATIVE   Leukocytes,Ua NEGATIVE NEGATIVE  Hemoglobin A1c  Result Value Ref Range   Hgb A1c MFr Bld 6.0 (H) 4.8 - 5.6 %   Mean Plasma Glucose 126 mg/dL  Lipid panel  Result Value Ref Range   Cholesterol 113 0 - 200 mg/dL   Triglycerides 75 <150 mg/dL   HDL 37 (L) >40 mg/dL   Total CHOL/HDL Ratio 3.1 RATIO   VLDL 15 0 - 40 mg/dL   LDL Cholesterol 61 0 - 99 mg/dL  CBC  Result Value Ref Range   WBC 10.0 4.0 - 10.5 K/uL   RBC 4.47 3.87 - 5.11 MIL/uL   Hemoglobin 13.5 12.0 - 15.0 g/dL   HCT 41.2 36.0 - 46.0 %   MCV 92.2 80.0 - 100.0  fL   MCH 30.2 26.0 - 34.0 pg   MCHC 32.8 30.0 - 36.0 g/dL   RDW 14.6 11.5 - 15.5 %   Platelets 199 150 - 400 K/uL   nRBC 0.0 0.0 - 0.2 %  Basic metabolic panel  Result Value Ref Range   Sodium 133 (L) 135 - 145 mmol/L   Potassium 3.2 (L) 3.5 -  5.1 mmol/L   Chloride 100 98 - 111 mmol/L   CO2 27 22 - 32 mmol/L   Glucose, Bld 114 (H) 70 - 99 mg/dL   BUN <5 (L) 8 - 23 mg/dL   Creatinine, Ser 0.61 0.44 - 1.00 mg/dL   Calcium 8.6 (L) 8.9 - 10.3 mg/dL   GFR, Estimated >60 >60 mL/min   Anion gap 6 5 - 15  I-stat chem 8, ED  Result Value Ref Range   Sodium 137 135 - 145 mmol/L   Potassium 4.0 3.5 - 5.1 mmol/L   Chloride 102 98 - 111 mmol/L   BUN 6 (L) 8 - 23 mg/dL   Creatinine, Ser 0.30 (L) 0.44 - 1.00 mg/dL   Glucose, Bld 147 (H) 70 - 99 mg/dL   Calcium, Ion 1.03 (L) 1.15 - 1.40 mmol/L   TCO2 26 22 - 32 mmol/L   Hemoglobin 15.3 (H) 12.0 - 15.0 g/dL   HCT 45.0 36.0 - 46.0 %  ECHOCARDIOGRAM COMPLETE  Result Value Ref Range   Weight 2,617.3 oz   BP 129/87 mmHg   S' Lateral 3.00 cm   AR max vel 1.90 cm2   AV Area VTI 1.75 cm2   AV Mean grad 2.0 mmHg   AV Peak grad 4.2 mmHg   Ao pk vel 1.02 m/s   Area-P 1/2 4.10 cm2   AV Area mean vel 1.87 cm2       Pertinent labs & imaging results that were available during my care of the patient were reviewed by me and considered in my medical decision making.  Assessment & Plan:  Tearsa was seen today for transitions of care.  Diagnoses and all orders for this visit:  Hospital discharge follow-up Today's visit was for Transitional Care Management.  The patient was discharged from [facility] on 09/27/2021 with a primary diagnosis of acute ischemic multifocal vascular territories stroke, dysphagia due to CVA, hypertension, impaired mobility, hypokalemia, chronic A-Fib on chronic anticoagulation.   Contact with the patient and/or caregiver, by a clinical staff member, was attempted on 01/26, 01/27, and 01/30 without answer and was  documented as a telephone encounter within the EMR.  Through chart review and discussion with the patient I have determined that management of their condition is of high complexity.    Acute ischemic multifocal multiple vascular territories stroke ([place]) Unable to take Pradaxa due to size and dysphagia, will send in liquid Xarelto. Will repeat labs today. Xarelto savings card provided.  -     CBC with Differential/Platelet -     BMP8+EGFR -     Rivaroxaban (XARELTO) 1 MG/ML SUSR; Take 15 mg by mouth daily.  Dysphagia due to recent cerebrovascular accident (CVA) Unable to take Pradaxa due to size and dysphagia, will send in liquid Xarelto. Will repeat labs today. Xarelto savings card provided. OT and PT are scheduled to see pt in home.  -     potassium chloride 20 MEQ/15ML (10%) SOLN; Take 7.5 mLs (10 mEq total) by mouth daily. -     CBC with Differential/Platelet -     BMP8+EGFR -     Rivaroxaban (XARELTO) 1 MG/ML SUSR; Take 15 mg by mouth daily.  Impaired mobility OT and PT are scheduled to see pt in home.   Essential hypertension Has not taken medications today, family aware to give pt medications when they get home.  -     CBC with Differential/Platelet -     BMP8+EGFR  Hypokalemia Unable to take pill  form, will send in liquid form. Labs repeated today.  -     potassium chloride 20 MEQ/15ML (10%) SOLN; Take 7.5 mLs (10 mEq total) by mouth daily. -     BMP8+EGFR  Chronic atrial fibrillation (HCC) Chronic anticoagulation  Will switch to liquid Xarelto as pt can not swallow pills. Rate well controlled. Will repeat labs.  -     CBC with Differential/Platelet -     BMP8+EGFR -     Rivaroxaban (XARELTO) 1 MG/ML SUSR; Take 15 mg by mouth daily.  Continue all other maintenance medications.  Follow up plan: Return in about 4 weeks (around 10/31/2021), or if symptoms worsen or fail to improve, for PCP.   Continue healthy lifestyle choices, including diet (rich in fruits,  vegetables, and lean proteins, and low in salt and simple carbohydrates) and exercise (at least 30 minutes of moderate physical activity daily).   The above assessment and management plan was discussed with the patient. The patient verbalized understanding of and has agreed to the management plan. Patient is aware to call the clinic if they develop any new symptoms or if symptoms persist or worsen. Patient is aware when to return to the clinic for a follow-up visit. Patient educated on when it is appropriate to go to the emergency department.   Monia Pouch, FNP-C [facility] [phone]

## 2021-10-04 DIAGNOSIS — M40209 Unspecified kyphosis, site unspecified: Secondary | ICD-10-CM | POA: Diagnosis not present

## 2021-10-04 DIAGNOSIS — I10 Essential (primary) hypertension: Secondary | ICD-10-CM | POA: Diagnosis not present

## 2021-10-04 DIAGNOSIS — I482 Chronic atrial fibrillation, unspecified: Secondary | ICD-10-CM | POA: Diagnosis not present

## 2021-10-04 DIAGNOSIS — H269 Unspecified cataract: Secondary | ICD-10-CM | POA: Diagnosis not present

## 2021-10-04 DIAGNOSIS — M810 Age-related osteoporosis without current pathological fracture: Secondary | ICD-10-CM | POA: Diagnosis not present

## 2021-10-04 DIAGNOSIS — E785 Hyperlipidemia, unspecified: Secondary | ICD-10-CM | POA: Diagnosis not present

## 2021-10-04 LAB — CBC WITH DIFFERENTIAL/PLATELET
Basophils Absolute: 0 10*3/uL (ref 0.0–0.2)
Basos: 1 %
EOS (ABSOLUTE): 0.3 10*3/uL (ref 0.0–0.4)
Eos: 3 %
Hematocrit: 46 % (ref 34.0–46.6)
Hemoglobin: 14.8 g/dL (ref 11.1–15.9)
Immature Grans (Abs): 0 10*3/uL (ref 0.0–0.1)
Immature Granulocytes: 0 %
Lymphocytes Absolute: 1.3 10*3/uL (ref 0.7–3.1)
Lymphs: 15 %
MCH: 29.4 pg (ref 26.6–33.0)
MCHC: 32.2 g/dL (ref 31.5–35.7)
MCV: 91 fL (ref 79–97)
Monocytes Absolute: 0.6 10*3/uL (ref 0.1–0.9)
Monocytes: 7 %
Neutrophils Absolute: 6.1 10*3/uL (ref 1.4–7.0)
Neutrophils: 74 %
Platelets: 260 10*3/uL (ref 150–450)
RBC: 5.04 x10E6/uL (ref 3.77–5.28)
RDW: 13.7 % (ref 11.7–15.4)
WBC: 8.3 10*3/uL (ref 3.4–10.8)

## 2021-10-04 LAB — BMP8+EGFR
BUN/Creatinine Ratio: 15 (ref 12–28)
BUN: 9 mg/dL (ref 8–27)
CO2: 26 mmol/L (ref 20–29)
Calcium: 9.7 mg/dL (ref 8.7–10.3)
Chloride: 98 mmol/L (ref 96–106)
Creatinine, Ser: 0.59 mg/dL (ref 0.57–1.00)
Glucose: 118 mg/dL — ABNORMAL HIGH (ref 70–99)
Potassium: 4 mmol/L (ref 3.5–5.2)
Sodium: 138 mmol/L (ref 134–144)
eGFR: 88 mL/min/{1.73_m2} (ref >59)

## 2021-10-06 DIAGNOSIS — H269 Unspecified cataract: Secondary | ICD-10-CM | POA: Diagnosis not present

## 2021-10-06 DIAGNOSIS — I482 Chronic atrial fibrillation, unspecified: Secondary | ICD-10-CM | POA: Diagnosis not present

## 2021-10-06 DIAGNOSIS — I10 Essential (primary) hypertension: Secondary | ICD-10-CM | POA: Diagnosis not present

## 2021-10-06 DIAGNOSIS — M40209 Unspecified kyphosis, site unspecified: Secondary | ICD-10-CM | POA: Diagnosis not present

## 2021-10-06 DIAGNOSIS — E785 Hyperlipidemia, unspecified: Secondary | ICD-10-CM | POA: Diagnosis not present

## 2021-10-06 DIAGNOSIS — M810 Age-related osteoporosis without current pathological fracture: Secondary | ICD-10-CM | POA: Diagnosis not present

## 2021-10-09 ENCOUNTER — Telehealth: Payer: Self-pay | Admitting: Family Medicine

## 2021-10-09 NOTE — Telephone Encounter (Signed)
Pt scheduled  

## 2021-10-09 NOTE — Telephone Encounter (Signed)
Pt had a stroke about 2 weeks ago--Son thinks that son may of had a mini stroke Saturday around lunch. About 15 mins later from taking her medications, she vomited and complained of stomach pain. Pt started taking Rivaroxaban (XARELTO) 1 MG/ML SUSR on Friday. She did not have it sat because of the sickness. Pt had a good day yesterday and took all of her rx. Please call back

## 2021-10-09 NOTE — Telephone Encounter (Signed)
Please offer this patient an appt to be seen this week for recheck

## 2021-10-10 ENCOUNTER — Telehealth: Payer: Self-pay | Admitting: *Deleted

## 2021-10-10 ENCOUNTER — Other Ambulatory Visit: Payer: Self-pay | Admitting: *Deleted

## 2021-10-10 ENCOUNTER — Encounter: Payer: Self-pay | Admitting: Family Medicine

## 2021-10-10 ENCOUNTER — Ambulatory Visit (INDEPENDENT_AMBULATORY_CARE_PROVIDER_SITE_OTHER): Payer: Medicare Other | Admitting: Family Medicine

## 2021-10-10 VITALS — BP 138/88 | HR 68 | Temp 97.7°F | Ht 62.0 in | Wt 149.2 lb

## 2021-10-10 DIAGNOSIS — I69391 Dysphagia following cerebral infarction: Secondary | ICD-10-CM | POA: Diagnosis not present

## 2021-10-10 DIAGNOSIS — M810 Age-related osteoporosis without current pathological fracture: Secondary | ICD-10-CM | POA: Diagnosis not present

## 2021-10-10 DIAGNOSIS — B372 Candidiasis of skin and nail: Secondary | ICD-10-CM | POA: Diagnosis not present

## 2021-10-10 DIAGNOSIS — I482 Chronic atrial fibrillation, unspecified: Secondary | ICD-10-CM

## 2021-10-10 DIAGNOSIS — M40209 Unspecified kyphosis, site unspecified: Secondary | ICD-10-CM | POA: Diagnosis not present

## 2021-10-10 DIAGNOSIS — I6389 Other cerebral infarction: Secondary | ICD-10-CM

## 2021-10-10 DIAGNOSIS — E785 Hyperlipidemia, unspecified: Secondary | ICD-10-CM | POA: Diagnosis not present

## 2021-10-10 DIAGNOSIS — Z7409 Other reduced mobility: Secondary | ICD-10-CM

## 2021-10-10 DIAGNOSIS — I10 Essential (primary) hypertension: Secondary | ICD-10-CM | POA: Diagnosis not present

## 2021-10-10 DIAGNOSIS — Z09 Encounter for follow-up examination after completed treatment for conditions other than malignant neoplasm: Secondary | ICD-10-CM

## 2021-10-10 DIAGNOSIS — R531 Weakness: Secondary | ICD-10-CM

## 2021-10-10 DIAGNOSIS — H269 Unspecified cataract: Secondary | ICD-10-CM | POA: Diagnosis not present

## 2021-10-10 DIAGNOSIS — Z7901 Long term (current) use of anticoagulants: Secondary | ICD-10-CM

## 2021-10-10 MED ORDER — XARELTO 1 MG/ML PO SUSR: 15.0000 mg | Freq: Every day | ORAL | 6 refills | Status: AC

## 2021-10-10 MED ORDER — NYSTATIN 100000 UNIT/GM EX CREA: 1.0000 "application " | TOPICAL_CREAM | Freq: Two times a day (BID) | CUTANEOUS | 1 refills | Status: AC

## 2021-10-10 NOTE — Progress Notes (Signed)
Subjective:  Patient ID: Kari [name], female    DOB: 1934/07/07, 88 y.o.   MRN: [id number]  Patient Care Team: [name], DO as PCP - General (Family Medicine) Minus Breeding, MD as Consulting Physician (Cardiology) Gatha Mayer, MD as Consulting Physician (Gastroenterology) Penni Bombard, MD as Consulting Physician (Neurology)   Chief Complaint:  Weakness   HPI: Kari [name] is a 88 y.o. female presenting on 10/10/2021 for Weakness   Pt presents today with her son and daughter-in-law. Son reports an episode of spitting up medications on Saturday and not wanting to eat. States symptoms lasted for a few hours and then resolved. States she has been doing well since then but they wanted to make sure things were ok. Daughter-in-law states pt has a rash under her left breast. She has been treating with protective ointment with some improvement in symptoms.  Pt denies complaints today but does have baseline confusion.   Weakness This is a recurrent problem. The current episode started in the past 7 days. The problem occurs intermittently. The problem has been waxing and waning. Associated symptoms include fatigue and weakness. Pertinent negatives include no abdominal pain, anorexia, arthralgias, change in bowel habit, chest pain, chills, congestion, coughing, diaphoresis, fever, headaches, joint swelling, myalgias, nausea, neck pain, numbness, rash, sore throat, swollen glands, urinary symptoms, vertigo, visual change or vomiting.     Relevant past medical, surgical, family, and social history reviewed and updated as indicated.  Allergies and medications reviewed and updated. Data reviewed: Chart in Epic.   Past Medical History:  Diagnosis Date   Adenomatous polyps    AF (atrial fibrillation) (HCC)    Cataract    Gallstones    Hx of adenomatous colonic polyps    Hyperlipidemia    x5 years   Hypertension    x5 years   Kyphosis    Metabolic syndrome X     Obesity    Osteoporosis    Stroke ([place]) 07/2017   Subdural hematoma    Symptomatic menopausal or female climacteric states    Vitamin D deficiency     Past Surgical History:  Procedure Laterality Date   ABDOMINAL HYSTERECTOMY     BLADDER SUSPENSION     COLONOSCOPY  multiple   ESOPHAGEAL DILATION N/A 03/03/2018   Procedure: ESOPHAGEAL DILATION;  Surgeon: Rogene Houston, MD;  Location: [facility];  Service: Endoscopy;  Laterality: N/A;   ESOPHAGOGASTRODUODENOSCOPY N/A 03/03/2018   Procedure: ESOPHAGOGASTRODUODENOSCOPY (EGD);  Surgeon: Rogene Houston, MD;  Location: [facility];  Service: Endoscopy;  Laterality: N/A;   EYE SURGERY Bilateral 09/2015   VAGINAL HYSTERECTOMY     total    Social History   Socioeconomic History   Marital status: Widowed    Spouse name: Ollen Gross    Number of children: 2   Years of education: Not on file   Highest education level: Not on file  Occupational History   Occupation: Psychologist, sport and exercise  Tobacco Use   Smoking status: Never   Smokeless tobacco: Never  Vaping Use   Vaping Use: Never used  Substance and Sexual Activity   Alcohol use: No   Drug use: No   Sexual activity: Not on file  Other Topics Concern   Not on file  Social History Narrative   Lives with her son, husband died 08-23-2021.   1 son (Therapist, art)   1 Daughter Contractor)   2 grandchildren and 3 great-grandchild   12th grade education  Social Determinants of Health   Financial Resource Strain: Low Risk    Difficulty of Paying Living Expenses: Not hard at all  Food Insecurity: No Food Insecurity   Worried About Charity fundraiser in the Last Year: Never true   [place] in the Last Year: Never true  Transportation Needs: No Transportation Needs   Lack of Transportation (Medical): No   Lack of Transportation (Non-Medical): No  Physical Activity: Insufficiently Active   Days of Exercise per Week: 3 days   Minutes of Exercise per Session: 30 min  Stress: No  Stress Concern Present   Feeling of Stress : Not at all  Social Connections: Moderately Integrated   Frequency of Communication with Friends and Family: More than three times a week   Frequency of Social Gatherings with Friends and Family: More than three times a week   Attends Religious Services: More than 4 times per year   Active Member of [employer] or Organizations: Yes   Attends Archivist Meetings: More than 4 times per year   Marital Status: Widowed  Intimate Partner Violence: Not At Risk   Fear of Current or Ex-Partner: No   Emotionally Abused: No   Physically Abused: No   Sexually Abused: No    Outpatient Encounter Medications as of 10/10/2021  Medication Sig   atenolol (TENORMIN) 50 MG tablet TAKE 1 TABLET DAILY (Patient taking differently: Take 50 mg by mouth daily.)   Benfotiamine 150 MG CAPS Take 150 mg by mouth daily.   captopril (CAPOTEN) 50 MG tablet TAKE 1 TABLET BY MOUTH 2 TIMES DAILY FOR HIGH BLOOD PRESSURE. (Patient taking differently: Take 50 mg by mouth 2 (two) times daily.)   ezetimibe (ZETIA) 10 MG tablet TAKE 1 TABLET ONCE DAILY FOR CHOLESTEROL (Patient taking differently: Take 10 mg by mouth daily.)   Multiple Vitamins-Minerals (ZINC PO) Take 1 tablet by mouth daily.   nystatin cream (MYCOSTATIN) Apply 1 application topically 2 (two) times daily.   pravastatin (PRAVACHOL) 80 MG tablet TAKE 1 TABLET ONCE DAILY FOR CHOLESTEROL (Patient taking differently: Take 40 mg by mouth daily.)   Rivaroxaban (XARELTO) 1 MG/ML SUSR Take 15 mg by mouth daily.   vitamin C (ASCORBIC ACID) 500 MG tablet Take 500 mg by mouth daily.   [DISCONTINUED] potassium chloride 20 MEQ/15ML (10%) SOLN Take 7.5 mLs (10 mEq total) by mouth daily.   [DISCONTINUED] loratadine (CLARITIN) 10 MG tablet Take 1 tablet (10 mg total) by mouth daily.   [DISCONTINUED] Rivaroxaban (XARELTO) 1 MG/ML SUSR Take 15 mg by mouth daily.   [DISCONTINUED] rivastigmine (EXELON) 4.6 mg/24hr Place 1 patch (4.6 mg  total) onto the skin daily.   No facility-administered encounter medications on file as of 10/10/2021.    Allergies  Allergen Reactions   Erythromycin Other (See Comments)    Reaction unknown   Penicillins Other (See Comments)    Reaction unknown  Has patient had a PCN reaction causing immediate rash, facial/tongue/throat swelling, SOB or lightheadedness with hypotension: unknown Has patient had a PCN reaction causing severe rash involving mucus membranes or skin necrosis: unknown Has patient had a PCN reaction that required hospitalization : unknown Has patient had a PCN reaction occurring within the last 10 years: no If all of the above answers are "NO", then may proceed with Cephalosporin use.     Review of Systems  Constitutional:  Positive for activity change, appetite change and fatigue. Negative for chills, diaphoresis, fever and unexpected weight change.  HENT:  Positive for trouble swallowing. Negative for congestion and sore throat.   Respiratory:  Negative for cough and shortness of breath.   Cardiovascular:  Negative for chest pain, palpitations and leg swelling.  Gastrointestinal:  Negative for abdominal pain, anorexia, change in bowel habit, nausea and vomiting.  Genitourinary:  Negative for decreased urine volume and difficulty urinating.  Musculoskeletal:  Negative for arthralgias, joint swelling, myalgias and neck pain.  Skin:  Negative for rash.  Neurological:  Positive for speech difficulty and weakness. Negative for dizziness, vertigo, tremors, seizures, syncope, facial asymmetry, light-headedness, numbness and headaches.  Psychiatric/Behavioral:  Positive for confusion.   All other systems reviewed and are negative.      Objective:  BP 138/88 Comment: at home reading per pt   Pulse 68    Temp 97.7 F (36.5 C) (Temporal)    Ht 5' 2"  (1.575 m)    Wt 149 lb 4 oz (67.7 kg)    BMI 27.30 kg/m    Wt Readings from Last 3 Encounters:  10/10/21 149 lb 4 oz (67.7 kg)   10/03/21 152 lb (68.9 kg)  09/26/21 163 lb 9.3 oz (74.2 kg)    Physical Exam Vitals and nursing note reviewed.  Constitutional:      General: She is not in acute distress.    Appearance: She is ill-appearing (chronically). She is not toxic-appearing or diaphoretic.  HENT:     Head: Normocephalic and atraumatic.     Mouth/Throat:     Mouth: Mucous membranes are moist.  Eyes:     Conjunctiva/sclera: Conjunctivae normal.     Pupils: Pupils are equal, round, and reactive to light.  Cardiovascular:     Rate and Rhythm: Normal rate. Rhythm irregularly irregular.     Heart sounds: Normal heart sounds.  Pulmonary:     Effort: Pulmonary effort is normal.     Breath sounds: Normal breath sounds.  Musculoskeletal:     Cervical back: Neck supple.     Right lower leg: No edema.     Left lower leg: No edema.  Skin:    General: Skin is warm and dry.     Capillary Refill: Capillary refill takes less than 2 seconds.     Findings: Erythema (beefy red rash under left breast with satellite lesions) present.  Neurological:     Mental Status: She is alert. Mental status is at baseline. She is disoriented.     Motor: No weakness (grips and leg strength normal).     Gait: Gait abnormal.     Comments: Slurred speech with larger words, normal speech with small words  Psychiatric:        Mood and Affect: Affect is flat.        Speech: Speech is slurred.        Behavior: Behavior is cooperative.        Cognition and Memory: Cognition is impaired. Memory is impaired.    Results for orders placed or performed in visit on 10/03/21  CBC with Differential/Platelet  Result Value Ref Range   WBC 8.3 3.4 - 10.8 x10E3/uL   RBC 5.04 3.77 - 5.28 x10E6/uL   Hemoglobin 14.8 11.1 - 15.9 g/dL   Hematocrit 46.0 34.0 - 46.6 %   MCV 91 79 - 97 fL   MCH 29.4 26.6 - 33.0 pg   MCHC 32.2 31.5 - 35.7 g/dL   RDW 13.7 11.7 - 15.4 %   Platelets 260 150 - 450 x10E3/uL   Neutrophils 74 Not Estab. %  Lymphs 15 Not  Estab. %   Monocytes 7 Not Estab. %   Eos 3 Not Estab. %   Basos 1 Not Estab. %   Neutrophils Absolute 6.1 1.4 - 7.0 x10E3/uL   Lymphocytes Absolute 1.3 0.7 - 3.1 x10E3/uL   Monocytes Absolute 0.6 0.1 - 0.9 x10E3/uL   EOS (ABSOLUTE) 0.3 0.0 - 0.4 x10E3/uL   Basophils Absolute 0.0 0.0 - 0.2 x10E3/uL   Immature Granulocytes 0 Not Estab. %   Immature Grans (Abs) 0.0 0.0 - 0.1 x10E3/uL  BMP8+EGFR  Result Value Ref Range   Glucose 118 (H) 70 - 99 mg/dL   BUN 9 8 - 27 mg/dL   Creatinine, Ser 0.59 0.57 - 1.00 mg/dL   eGFR 88 >59 mL/min/1.73   BUN/Creatinine Ratio 15 12 - 28   Sodium 138 134 - 144 mmol/L   Potassium 4.0 3.5 - 5.2 mmol/L   Chloride 98 96 - 106 mmol/L   CO2 26 20 - 29 mmol/L   Calcium 9.7 8.7 - 10.3 mg/dL       Pertinent labs & imaging results that were available during my care of the patient were reviewed by me and considered in my medical decision making.  Assessment & Plan:  Kari [name] was seen today for weakness.  Diagnoses and all orders for this visit:  General weakness Waxing and waning since hospitalization. Has OT, PT, and HH coming to home.  Dysphagia due to recent cerebrovascular accident (CVA) OT is to complete swallow study at home, continued thickened liquids until evaluation is completed.   Impaired mobility Doing better but continues to use walker.   Candidal intertrigo Nystatin as prescribed, symptomatic care discussed in detail.  -     nystatin cream (MYCOSTATIN); Apply 1 application topically 2 (two) times daily.  Son provided reassurance about care he is providing. Aware to call if he has any questions or concerns. Does not wish for pt to go back to hospital or to be placed in assisted living or SNF at this time.    Continue all other maintenance medications.  Follow up plan: Return if symptoms worsen or fail to improve.   Continue healthy lifestyle choices, including diet (rich in fruits, vegetables, and lean proteins, and low in salt and  simple carbohydrates) and exercise (at least 30 minutes of moderate physical activity daily).   The above assessment and management plan was discussed with the patient. The patient verbalized understanding of and has agreed to the management plan. Patient is aware to call the clinic if they develop any new symptoms or if symptoms persist or worsen. Patient is aware when to return to the clinic for a follow-up visit. Patient educated on when it is appropriate to go to the emergency department.   Monia Pouch, FNP-C [facility] [phone]

## 2021-10-10 NOTE — Telephone Encounter (Signed)
Approved today PA Case: [id number], Status: Approved, Coverage Starts on: 09/03/2021 12:00:00 AM, Coverage Ends on: 09/02/2022 12:00:00 AM. Questions? Contact [phone].  [facility] aware

## 2021-10-10 NOTE — Telephone Encounter (Signed)
(  Key: [id number]) Xarelto 1MG /ML suspension Sent to Plan today

## 2021-10-11 ENCOUNTER — Other Ambulatory Visit: Payer: Self-pay | Admitting: Family Medicine

## 2021-10-11 DIAGNOSIS — M40209 Unspecified kyphosis, site unspecified: Secondary | ICD-10-CM | POA: Diagnosis not present

## 2021-10-11 DIAGNOSIS — H269 Unspecified cataract: Secondary | ICD-10-CM | POA: Diagnosis not present

## 2021-10-11 DIAGNOSIS — M810 Age-related osteoporosis without current pathological fracture: Secondary | ICD-10-CM | POA: Diagnosis not present

## 2021-10-11 DIAGNOSIS — E785 Hyperlipidemia, unspecified: Secondary | ICD-10-CM | POA: Diagnosis not present

## 2021-10-11 DIAGNOSIS — I482 Chronic atrial fibrillation, unspecified: Secondary | ICD-10-CM | POA: Diagnosis not present

## 2021-10-11 DIAGNOSIS — I10 Essential (primary) hypertension: Secondary | ICD-10-CM | POA: Diagnosis not present

## 2021-10-12 DIAGNOSIS — M810 Age-related osteoporosis without current pathological fracture: Secondary | ICD-10-CM | POA: Diagnosis not present

## 2021-10-12 DIAGNOSIS — I10 Essential (primary) hypertension: Secondary | ICD-10-CM | POA: Diagnosis not present

## 2021-10-12 DIAGNOSIS — I482 Chronic atrial fibrillation, unspecified: Secondary | ICD-10-CM | POA: Diagnosis not present

## 2021-10-12 DIAGNOSIS — E785 Hyperlipidemia, unspecified: Secondary | ICD-10-CM | POA: Diagnosis not present

## 2021-10-12 DIAGNOSIS — M40209 Unspecified kyphosis, site unspecified: Secondary | ICD-10-CM | POA: Diagnosis not present

## 2021-10-12 DIAGNOSIS — H269 Unspecified cataract: Secondary | ICD-10-CM | POA: Diagnosis not present

## 2021-10-13 ENCOUNTER — Telehealth: Payer: Self-pay | Admitting: Family Medicine

## 2021-10-13 NOTE — Telephone Encounter (Signed)
[facility] IS ASKING TO GET HOSPICE CARE FOR PATIENT, PATIENT HAS DECLINED AND THEY ARE HAVING DIFFICULTLY TAKING CARE OF HER    [phone] [facility] [employer] NUM[employer]

## 2021-10-13 NOTE — Telephone Encounter (Signed)
[facility] referral form & records placed on providers desk

## 2021-10-13 NOTE — Telephone Encounter (Signed)
[employer] called and 2nd PA done by phone to let them know she will be cancelling Pradaxa and will not cont taking them together.   [employer] review done - should have response in 24 hours - [place] aware Ref# [id number]

## 2021-10-16 ENCOUNTER — Telehealth: Payer: Self-pay | Admitting: Family Medicine

## 2021-10-16 DIAGNOSIS — Z7901 Long term (current) use of anticoagulants: Secondary | ICD-10-CM | POA: Diagnosis not present

## 2021-10-16 DIAGNOSIS — R627 Adult failure to thrive: Secondary | ICD-10-CM | POA: Diagnosis not present

## 2021-10-16 DIAGNOSIS — I69391 Dysphagia following cerebral infarction: Secondary | ICD-10-CM | POA: Diagnosis not present

## 2021-10-16 DIAGNOSIS — I482 Chronic atrial fibrillation, unspecified: Secondary | ICD-10-CM | POA: Diagnosis not present

## 2021-10-16 DIAGNOSIS — W19XXXS Unspecified fall, sequela: Secondary | ICD-10-CM | POA: Diagnosis not present

## 2021-10-16 DIAGNOSIS — E876 Hypokalemia: Secondary | ICD-10-CM | POA: Diagnosis not present

## 2021-10-16 DIAGNOSIS — R531 Weakness: Secondary | ICD-10-CM | POA: Diagnosis not present

## 2021-10-16 DIAGNOSIS — R1310 Dysphagia, unspecified: Secondary | ICD-10-CM | POA: Diagnosis not present

## 2021-10-16 DIAGNOSIS — I69398 Other sequelae of cerebral infarction: Secondary | ICD-10-CM | POA: Diagnosis not present

## 2021-10-16 DIAGNOSIS — I10 Essential (primary) hypertension: Secondary | ICD-10-CM | POA: Diagnosis not present

## 2021-10-16 NOTE — Telephone Encounter (Signed)
Pt's son aware of provider feedback and voiced understanding.

## 2021-10-16 NOTE — Telephone Encounter (Signed)
I have completed the hospice form and will send over the last few visit notes with Sharyn Lull.

## 2021-10-16 NOTE — Telephone Encounter (Signed)
[facility] aware

## 2021-10-16 NOTE — Telephone Encounter (Signed)
Agree with changes

## 2021-10-16 NOTE — Telephone Encounter (Signed)
Kari [name] admitted her today to Hospice care Would like to add Ondansetron & Loperamide to med list Would like to stop Pravastatin, Vit C, Zinc & Ezetimibe

## 2021-10-16 NOTE — Telephone Encounter (Signed)
Debbie @ [facility] aware fax sent on pt

## 2021-10-17 ENCOUNTER — Telehealth: Payer: Self-pay | Admitting: Family Medicine

## 2021-11-01 ENCOUNTER — Ambulatory Visit: Payer: Medicare Other | Admitting: Family Medicine

## 2021-11-01 DEATH — deceased
